# Patient Record
Sex: Male | Born: 1950
Health system: Southern US, Community
[De-identification: ages and names within clinical notes are randomized; demographics above are authoritative.]

## PROBLEM LIST (undated history)

## (undated) DIAGNOSIS — C801 Malignant (primary) neoplasm, unspecified: Secondary | ICD-10-CM

## (undated) DIAGNOSIS — M1990 Unspecified osteoarthritis, unspecified site: Secondary | ICD-10-CM

## (undated) DIAGNOSIS — N400 Enlarged prostate without lower urinary tract symptoms: Secondary | ICD-10-CM

## (undated) DIAGNOSIS — K219 Gastro-esophageal reflux disease without esophagitis: Secondary | ICD-10-CM

## (undated) DIAGNOSIS — Z86010 Personal history of colonic polyps: Secondary | ICD-10-CM

## (undated) DIAGNOSIS — I2510 Atherosclerotic heart disease of native coronary artery without angina pectoris: Secondary | ICD-10-CM

## (undated) DIAGNOSIS — I2721 Secondary pulmonary arterial hypertension: Secondary | ICD-10-CM

## (undated) DIAGNOSIS — I10 Essential (primary) hypertension: Secondary | ICD-10-CM

## (undated) DIAGNOSIS — K5730 Diverticulosis of large intestine without perforation or abscess without bleeding: Secondary | ICD-10-CM

## (undated) DIAGNOSIS — N529 Male erectile dysfunction, unspecified: Secondary | ICD-10-CM

## (undated) DIAGNOSIS — I739 Peripheral vascular disease, unspecified: Secondary | ICD-10-CM

## (undated) DIAGNOSIS — I700 Atherosclerosis of aorta: Secondary | ICD-10-CM

## (undated) DIAGNOSIS — D509 Iron deficiency anemia, unspecified: Secondary | ICD-10-CM

## (undated) DIAGNOSIS — J449 Chronic obstructive pulmonary disease, unspecified: Secondary | ICD-10-CM

## (undated) DIAGNOSIS — R972 Elevated prostate specific antigen [PSA]: Secondary | ICD-10-CM

## (undated) HISTORY — DX: Secondary pulmonary arterial hypertension: I27.21

## (undated) HISTORY — DX: Chronic obstructive pulmonary disease, unspecified: J44.9

## (undated) HISTORY — DX: Iron deficiency anemia, unspecified: D50.9

## (undated) HISTORY — DX: Atherosclerotic heart disease of native coronary artery without angina pectoris: I25.10

## (undated) HISTORY — DX: Atherosclerosis of aorta: I70.0

## (undated) HISTORY — DX: Unspecified osteoarthritis, unspecified site: M19.90

## (undated) HISTORY — PX: NO PAST SURGERIES: SHX2092

## (undated) HISTORY — DX: Elevated prostate specific antigen (PSA): R97.20

## (undated) HISTORY — DX: Essential (primary) hypertension: I10

## (undated) HISTORY — DX: Male erectile dysfunction, unspecified: N52.9

## (undated) HISTORY — PX: COLONOSCOPY: SHX174

## (undated) HISTORY — DX: Gastro-esophageal reflux disease without esophagitis: K21.9

## (undated) HISTORY — DX: Benign prostatic hyperplasia without lower urinary tract symptoms: N40.0

## (undated) HISTORY — DX: Personal history of colonic polyps: Z86.010

## (undated) HISTORY — DX: Diverticulosis of large intestine without perforation or abscess without bleeding: K57.30

---

## 2005-05-10 ENCOUNTER — Ambulatory Visit: Payer: Self-pay | Admitting: Internal Medicine

## 2006-07-17 ENCOUNTER — Ambulatory Visit: Payer: Self-pay | Admitting: Internal Medicine

## 2007-11-24 ENCOUNTER — Ambulatory Visit: Payer: Self-pay | Admitting: Internal Medicine

## 2007-11-24 DIAGNOSIS — Z860100 Personal history of colon polyps, unspecified: Secondary | ICD-10-CM

## 2007-11-24 DIAGNOSIS — I10 Essential (primary) hypertension: Secondary | ICD-10-CM | POA: Insufficient documentation

## 2007-11-24 DIAGNOSIS — K5730 Diverticulosis of large intestine without perforation or abscess without bleeding: Secondary | ICD-10-CM

## 2007-11-24 HISTORY — DX: Personal history of colon polyps, unspecified: Z86.0100

## 2007-11-24 HISTORY — DX: Personal history of colonic polyps: Z86.010

## 2007-11-24 HISTORY — DX: Diverticulosis of large intestine without perforation or abscess without bleeding: K57.30

## 2007-11-24 HISTORY — DX: Essential (primary) hypertension: I10

## 2007-11-24 LAB — CONVERTED CEMR LAB
Albumin: 3.8 g/dL (ref 3.5–5.2)
Alkaline Phosphatase: 55 units/L (ref 39–117)
BUN: 12 mg/dL (ref 6–23)
Bilirubin Urine: NEGATIVE
Bilirubin, Direct: 0.1 mg/dL (ref 0.0–0.3)
Calcium: 9.6 mg/dL (ref 8.4–10.5)
Cholesterol: 141 mg/dL (ref 0–200)
Eosinophils Absolute: 0.2 10*3/uL (ref 0.0–0.6)
GFR calc Af Amer: 100 mL/min
GFR calc non Af Amer: 83 mL/min
Glucose, Bld: 82 mg/dL (ref 70–99)
HCT: 44.2 % (ref 39.0–52.0)
HDL: 29 mg/dL — ABNORMAL LOW (ref >39.0)
Hemoglobin, Urine: NEGATIVE
Hemoglobin: 14.2 g/dL (ref 13.0–17.0)
Leukocytes, UA: NEGATIVE
MCV: 94.5 fL (ref 78.0–100.0)
Monocytes Absolute: 0.5 10*3/uL (ref 0.2–0.7)
Monocytes Relative: 6.8 % (ref 3.0–11.0)
Neutro Abs: 4 10*3/uL (ref 1.4–7.7)
Platelets: 306 10*3/uL (ref 150–400)
Potassium: 5.3 meq/L — ABNORMAL HIGH (ref 3.5–5.1)
RDW: 13.2 % (ref 11.5–14.6)
Sodium: 144 meq/L (ref 135–145)
TSH: 0.71 microintl units/mL (ref 0.35–5.50)
Total Protein: 7.3 g/dL (ref 6.0–8.3)
Triglycerides: 106 mg/dL (ref 0–149)
Urine Glucose: NEGATIVE mg/dL
VLDL: 21 mg/dL (ref 0–40)

## 2009-01-11 ENCOUNTER — Ambulatory Visit: Payer: Self-pay | Admitting: Internal Medicine

## 2009-01-12 LAB — CONVERTED CEMR LAB
ALT: 18 units/L (ref 0–53)
AST: 21 units/L (ref 0–37)
Albumin: 3.7 g/dL (ref 3.5–5.2)
Alkaline Phosphatase: 48 units/L (ref 39–117)
Basophils Absolute: 0 10*3/uL (ref 0.0–0.1)
Basophils Relative: 0.5 % (ref 0.0–3.0)
Calcium: 8.6 mg/dL (ref 8.4–10.5)
Cholesterol: 131 mg/dL (ref 0–200)
Eosinophils Relative: 3.3 % (ref 0.0–5.0)
GFR calc non Af Amer: 99.48 mL/min (ref >60)
Glucose, Bld: 97 mg/dL (ref 70–99)
HCT: 36.7 % — ABNORMAL LOW (ref 39.0–52.0)
Hemoglobin: 12.6 g/dL — ABNORMAL LOW (ref 13.0–17.0)
Ketones, ur: NEGATIVE mg/dL
Leukocytes, UA: NEGATIVE
Lymphocytes Relative: 37.7 % (ref 12.0–46.0)
Lymphs Abs: 2.8 10*3/uL (ref 0.7–4.0)
Monocytes Relative: 5.7 % (ref 3.0–12.0)
Neutro Abs: 3.9 10*3/uL (ref 1.4–7.7)
Nitrite: NEGATIVE
PSA: 1.12 ng/mL (ref 0.10–4.00)
Potassium: 3.6 meq/L (ref 3.5–5.1)
RBC: 3.93 M/uL — ABNORMAL LOW (ref 4.22–5.81)
Sodium: 142 meq/L (ref 135–145)
Specific Gravity, Urine: 1.025 (ref 1.000–1.030)
TSH: 0.59 microintl units/mL (ref 0.35–5.50)
Total Protein: 6.6 g/dL (ref 6.0–8.3)
Urobilinogen, UA: 0.2 (ref 0.0–1.0)
VLDL: 8.6 mg/dL (ref 0.0–40.0)
WBC: 7.3 10*3/uL (ref 4.5–10.5)

## 2010-02-13 ENCOUNTER — Encounter (INDEPENDENT_AMBULATORY_CARE_PROVIDER_SITE_OTHER): Payer: Self-pay | Admitting: *Deleted

## 2010-03-14 ENCOUNTER — Ambulatory Visit: Payer: Self-pay | Admitting: Internal Medicine

## 2010-03-14 LAB — CONVERTED CEMR LAB
ALT: 14 units/L (ref 0–53)
AST: 20 units/L (ref 0–37)
Alkaline Phosphatase: 53 units/L (ref 39–117)
BUN: 17 mg/dL (ref 6–23)
Bilirubin Urine: NEGATIVE
Bilirubin, Direct: 0.1 mg/dL (ref 0.0–0.3)
Creatinine, Ser: 1 mg/dL (ref 0.4–1.5)
Eosinophils Relative: 5.4 % — ABNORMAL HIGH (ref 0.0–5.0)
GFR calc non Af Amer: 94.68 mL/min (ref >60)
HCT: 37.3 % — ABNORMAL LOW (ref 39.0–52.0)
Hemoglobin, Urine: NEGATIVE
LDL Cholesterol: 68 mg/dL (ref 0–99)
Leukocytes, UA: NEGATIVE
Monocytes Relative: 7.4 % (ref 3.0–12.0)
Neutrophils Relative %: 49.2 % (ref 43.0–77.0)
Nitrite: NEGATIVE
PSA: 1.57 ng/mL (ref 0.10–4.00)
Platelets: 240 10*3/uL (ref 150.0–400.0)
Potassium: 4 meq/L (ref 3.5–5.1)
RBC: 4.02 M/uL — ABNORMAL LOW (ref 4.22–5.81)
TSH: 1.41 microintl units/mL (ref 0.35–5.50)
Total Bilirubin: 0.5 mg/dL (ref 0.3–1.2)
Total CHOL/HDL Ratio: 5
Total Protein, Urine: NEGATIVE mg/dL
WBC: 7.6 10*3/uL (ref 4.5–10.5)

## 2010-03-15 ENCOUNTER — Encounter (INDEPENDENT_AMBULATORY_CARE_PROVIDER_SITE_OTHER): Payer: Self-pay | Admitting: *Deleted

## 2010-04-30 ENCOUNTER — Encounter (INDEPENDENT_AMBULATORY_CARE_PROVIDER_SITE_OTHER): Payer: Self-pay | Admitting: *Deleted

## 2010-05-02 ENCOUNTER — Ambulatory Visit: Payer: Self-pay | Admitting: Internal Medicine

## 2010-05-16 ENCOUNTER — Ambulatory Visit: Payer: Self-pay | Admitting: Internal Medicine

## 2010-05-17 ENCOUNTER — Encounter: Payer: Self-pay | Admitting: Internal Medicine

## 2010-10-02 NOTE — Miscellaneous (Signed)
Summary: [facility]  Clinical Lists Changes  Medications: Added new medication of MIRALAX   POWD (POLYETHYLENE GLYCOL 3350) As per prep  instructions. - Signed Added new medication of DULCOLAX 5 MG  TBEC (BISACODYL) Day before procedure take 2 at 3pm and 2 at 8pm. - Signed Added new medication of REGLAN 10 MG  TABS (METOCLOPRAMIDE HCL) As per prep instructions. - Signed Rx of MIRALAX   POWD (POLYETHYLENE GLYCOL 3350) As per prep  instructions.;  #255gm x 0;  Signed;  Entered by: Ezra Sites RN;  Authorized by: Hart Carwin MD;  Method used: [facility]. [phone]*, [address], [place], [place]  [address], Ph: [phone] or [phone], Fax: [phone] Rx of DULCOLAX 5 MG  TBEC (BISACODYL) Day before procedure take 2 at 3pm and 2 at 8pm.;  #4 x 0;  Signed;  Entered by: Ezra Sites RN;  Authorized by: Hart Carwin MD;  Method used: [facility] to [facility]. [phone]*, [address], [place], [place]  [address], Ph: [phone] or [phone], Fax: [phone] Rx of REGLAN 10 MG  TABS (METOCLOPRAMIDE HCL) As per prep instructions.;  #2 x 0;  Signed;  Entered by: Ezra Sites RN;  Authorized by: Hart Carwin MD;  Method used: [facility] to [facility]. [phone]*, [address] [place], [place]  [address], Ph: [phone] or [phone], Fax: [phone] Observations: Added new observation of NKA: T (05/02/2010 10:26)    Prescriptions: REGLAN 10 MG  TABS (METOCLOPRAMIDE HCL) As per prep instructions.  #2 x 0   Entered by:   Ezra Sites RN   Authorized by:   Hart Carwin MD   Signed by:   Ezra Sites RN on 05/02/2010   Method used:   [facility] to        [facility]. [phone]* (retail)       [address], [place]  [address]       Ph: [phone] or [phone]       Fax: [phone]   RxID:   [id number] DULCOLAX 5 MG  TBEC (BISACODYL) Day before procedure take 2 at 3pm and 2 at 8pm.  #4 x 0   Entered by:   Ezra Sites RN   Authorized by:    Hart Carwin MD   Signed by:   Ezra Sites RN on 05/02/2010   Method used:   [facility]ronically to        [facility]. [phone]* (retail)       [address], [place]  [address]       Ph: [phone] or [phone]       Fax: [phone]   RxID:   [id number] MIRALAX   POWD (POLYETHYLENE GLYCOL 3350) As per prep  instructions.  #255gm x 0   Entered by:   Ezra Sites RN   Authorized by:   Hart Carwin MD   Signed by:   Ezra Sites RN on 05/02/2010   Method used:   [facility]ron[facility] to        [facility]. [phone]* (retail)       [address]       [place], [place]  [address]       Ph: [phone] or [phone]       Fax: [phone]   RxID:   [id number]

## 2010-10-02 NOTE — Letter (Signed)
Summary: Colonoscopy Letter  [facility]  [address] [place], [place] [address]   Phone: [phone]  Fax: [phone]      February 13, 2010 MRN: [id number]   [facility] RECORD [address] [place], [place]  [address]   Dear Mr. NEUMEISTER,   According to your medical record, it is time for you to schedule a Colonoscopy. The American Cancer Society recommends this procedure as a method to detect early colon cancer. Patients with a family history of colon cancer, or a personal history of colon polyps or inflammatory bowel disease are at increased risk.  This letter has beeen generated based on the recommendations made at the time of your procedure. If you feel that in your particular situation this may no longer apply, please contact our office.  Please call our office at [phone] to schedule this appointment or to update your records at your earliest convenience.  Thank you for cooperating with [place] to provide you with the very best care possible.   Sincerely,  Hedwig Morton. Juanda Chance, M.D.  [facility] Gastroenterology Division [phone]

## 2010-10-02 NOTE — Letter (Signed)
Summary: Patient Notice- Polyp Results  [facility]  [address] [place], [place] [address]   Phone: [phone]  Fax: [phone]        May 17, 2010 MRN: [id number]    Joshua Curtis [address] [place], [place]  [address]    Dear [name],  I am pleased to inform you that the colon polyp(s) removed during your recent colonoscopy was (were) found to be benign (no cancer detected) upon pathologic examination.The polyps were adenomatous ( precancerous) and hyperplastic ( not precancerous)  I recommend you have a repeat colonoscopy examination in 5_ years to look for recurrent polyps, as having colon polyps increases your risk for having recurrent polyps or even colon cancer in the future.  Should you develop new or worsening symptoms of abdominal pain, bowel habit changes or bleeding from the rectum or bowels, please schedule an evaluation with either your primary care physician or with me.  Additional information/recommendations:  _x_ No further action with gastroenterology is needed at this time. Please      follow-up with your primary care physician for your other healthcare      needs.  __ Please call [phone] to schedule a return visit to review your      situation.  __ Please keep your follow-up visit as already scheduled.  __ Continue treatment plan as outlined the day of your exam.  Please call us if you are having persistent problems or have questions about your condition that have not been fully answered at this time.  Sincerely,  Hart Carwin MD  This letter has been electronically signed by your physician.  Appended Document: Patient Notice- Polyp Results letter mailed

## 2010-10-02 NOTE — Letter (Signed)
Summary: [facility] Instructions  [facility]  [address] [place], [place] [address]   Phone: [phone]  Fax: [phone]       Joshua Curtis    1950-10-16    MRN: [id number]       Procedure Day Dorna Bloom: Lucia Bitter. 05/16/2010     Arrival Time: 08:00AM     Procedure Time: 09:00AM     Location of Procedure:                    _ X_  [facility] (4th Floor)   PREPARATION FOR COLONOSCOPY WITH MIRALAX  Starting 5 days prior to your procedure 09/09 do not eat nuts, seeds, popcorn, corn, beans, peas,  salads, or any raw vegetables.  Do not take any fiber supplements (e.g. Metamucil, Citrucel, and Benefiber). ____________________________________________________________________________________________________   THE DAY BEFORE YOUR PROCEDURE         DATE: 09/13 DAY: Rica Mote.  1   Drink clear liquids the entire day-NO SOLID FOOD  2   Do not drink anything colored red or purple.  Avoid juices with pulp.  No orange juice.  3   Drink at least 64 oz. (8 glasses) of fluid/clear liquids during the day to prevent dehydration and help the prep work efficiently.  CLEAR LIQUIDS INCLUDE: Water Jello Ice Popsicles Tea (sugar ok, no milk/cream) Powdered fruit flavored drinks Coffee (sugar ok, no milk/cream) Gatorade Juice: apple, white grape, white cranberry  Lemonade Clear bullion, consomm, broth Carbonated beverages (any kind) Strained chicken noodle soup Hard Candy  4   Mix the entire bottle of Miralax with 64 oz. of Gatorade/Powerade in the morning and put in the refrigerator to chill.  5   At 3:00 pm take 2 Dulcolax/Bisacodyl tablets.  6   At 4:30 pm take one Reglan/Metoclopramide tablet.  7  Starting at 5:00 pm drink one 8 oz glass of the Miralax mixture every 15-20 minutes until you have finished drinking the entire 64 oz.  You should finish drinking prep around 7:30 or 8:00 pm.  8   If you are nauseated, you may take the 2nd Reglan/Metoclopramide tablet at  6:30 pm.        9    At 8:00 pm take 2 more DULCOLAX/Bisacodyl tablets.     THE DAY OF YOUR PROCEDURE      DATE:  09/14   DAY: Wed.  You may drink clear liquids until 07:00AM  (2 HOURS BEFORE PROCEDURE).   MEDICATION INSTRUCTIONS  Unless otherwise instructed, you should take regular prescription medications with a small sip of water as early as possible the morning of your procedure.           OTHER INSTRUCTIONS  You will need a responsible adult at least 58 years of age to accompany you and drive you home.   This person must remain in the waiting room during your procedure.  Wear loose fitting clothing that is easily removed.  Leave jewelry and other valuables at home.  However, you may wish to bring a book to read or an iPod/MP3 player to listen to music as you wait for your procedure to start.  Remove all body piercing jewelry and leave at home.  Total time from sign-in until discharge is approximately 2-3 hours.  You should go home directly after your procedure and rest.  You can resume normal activities the day after your procedure.  The day of your procedure you should not:   Drive   Make  legal decisions   Operate machinery   Drink alcohol   Return to work  You will receive specific instructions about eating, activities and medications before you leave.   The above instructions have been reviewed and explained to me by   Ezra Sites RN  May 02, 2010 10:54 AM     I fully understand and can verbalize these instructions _____________________________ Date _______

## 2010-10-02 NOTE — Letter (Signed)
Summary: Previsit letter  [facility]  [address] [place], [place] [address]   Phone: [phone]  Fax: [phone]       03/15/2010 MRN: [id number]  Joshua Curtis [address] [place], [place]  [address]  Dear Mr. S[place]MERVILLE,  Welcome to the Gastroenterology Division at [facility].    You are scheduled to see a nurse for your pre-procedure visit on May 02, 2010 at 10:30am on the 3rd floor at [facility], [address]  We ask that you try to arrive at our office 15 minutes prior to your appointment time to allow for check-in.  Your nurse visit will consist of discussing your medical and surgical history, your immediate family medical history, and your medications.    Please bring a complete list of all your medications or, if you prefer, bring the medication bottles and we will list them.  We will need to be aware of both prescribed and over the counter drugs.  We will need to know exact dosage information as well.  If you are on blood thinners (Coumadin, Plavix, Aggrenox, Ticlid, etc.) please call our office today/prior to your appointment, as we need to consult with your physician about holding your medication.   Please be prepared to read and sign documents such as consent forms, a financial agreement, and acknowledgement forms.  If necessary, and with your consent, a friend or relative is welcome to sit-in on the nurse visit with you.  Please bring your insurance card so that we may make a copy of it.  If your insurance requires a referral to see a specialist, please bring your referral form from your primary care physician.  No co-pay is required for this nurse visit.     If you cannot keep your appointment, please call [phone] to cancel or reschedule prior to your appointment date.  This allows [place] the opportunity to schedule an appointment for another patient in need of care.    Thank you for choosing [facility] for your  medical needs.  We appreciate the opportunity to care for you.  Please visit [place] at our website  to learn more about our practice.                     Sincerely.                                                                                                                   The Gastroenterology Division

## 2010-10-02 NOTE — Procedures (Signed)
Summary: Colonoscopy  Patient: Joshua Curtis Note: All result statuses are Final unless otherwise noted.  Tests: (1) Colonoscopy (COL)   COL Colonoscopy           DONE     [facility]     [address]     [place], [place]  [address]           COLONOSCOPY PROCEDURE REPORT           PATIENT:  Joshua Curtis, Joshua Curtis  MR#:  [id number]     BIRTHDATE:  November 04, 1950, 59 yrs. old  GENDER:  male     ENDOSCOPIST:  Hedwig Morton. Juanda Chance, MD     REF. BY:     PROCEDURE DATE:  05/16/2010     PROCEDURE:  Colonoscopy 213[address]     ASA CLASS:  Class I     INDICATIONS:  Elevated Risk Screening hyperplastic polyp in 2004     MEDICATIONS:   Versed 9 mg, Fentanyl 75 mcg           DESCRIPTION OF PROCEDURE:   After the risks benefits and     alternatives of the procedure were thoroughly explained, informed     consent was obtained.  Digital rectal exam was performed and     revealed no rectal masses.   The LB [id number] endoscope     was introduced through the anus and advanced to the cecum, which     was identified by both the appendix and ileocecal valve, without     limitations.  The quality of the prep was good, using MiraLax.     The instrument was then slowly withdrawn as the colon was fully     examined.     <<PROCEDUREIMAGES>>           FINDINGS:  Two polyps were found. 8 mm and 3 mm sessile polyp at     15 and 20 cm The polyp was removed using cold biopsy forceps.     Polyp was snared without cautery. Retrieval was successful (see     image6 and image1). snare polyp  Mild diverticulosis was found in     the sigmoid colon (see image2).  This was otherwise a normal     examination of the colon (see image3, image4, image5, and image7).     Retroflexed views in the rectum revealed no abnormalities.    The     scope was then withdrawn from the patient and the procedure     completed.           COMPLICATIONS:  None     ENDOSCOPIC IMPRESSION:     1) Two polyps     2) Mild diverticulosis in  the sigmoid colon     3) Otherwise normal examination     RECOMMENDATIONS:     1) Await pathology results     2) High fiber diet.     REPEAT EXAM:  In 5 - 7 year(s) for.           ______________________________     Hedwig Morton. Juanda Chance, MD           CC:           n.     eSIGNED:   Hedwig Morton. Brodie at 05/16/2010 09:54 AM           Ellyn Hack, [id number]  Note: An exclamation mark (!) indicates a result that was not dispersed into the flowsheet. Document Creation Date: 05/16/2010  9:55 AM _______________________________________________________________________  (1) Order result status: Final Collection or observation date-time: 05/16/2010 09:46 Requested date-time:  Receipt date-time:  Reported date-time:  Referring Physician:   Ordering Physician: Lina Sar [phone]) Specimen Source:  Source: Launa Grill Order Number: [id number] Lab site:   Appended Document: Colonoscopy     Procedures Next Due Date:    Colonoscopy: 05/2015

## 2010-10-02 NOTE — Assessment & Plan Note (Signed)
Summary: F/U APPT/#/CD   Vital Signs:  Patient profile:   60 year old male Height:      70 inches Weight:      179.75 pounds BMI:     25.88 O2 Sat:      98 % on Room air Temp:     97.6 degrees F oral Pulse rate:   79 / minute BP sitting:   142 / 78  (left arm) Cuff size:   regular  Vitals Entered By: Zella Ball Ewing CMA Duncan Dull) (March 14, 2010 9:04 AM)  O2 Flow:  Room air   CC: followup, refills/RE   CC:  followup and refills/RE.  History of Present Illness: overall doing wel, BP at home  140/90;  Pt denies CP, sob, doe, wheezing, orthopnea, pnd, worsening LE edema, palps, dizziness or syncope  Pt denies new neuro symptoms such as headache, facial or extremity weakness   Preventive Screening-Counseling & Management      Drug Use:  no.    Problems Prior to Update: 1)  Preventive Health Care  (ICD-V70.0) 2)  Preventive Health Care  (ICD-V70.0) 3)  Diverticulosis, Colon  (ICD-562.10) 4)  Colonic Polyps, Hx of  (ICD-V12.72) 5)  Hypertension  (ICD-401.9)  Medications Prior to Update: 1)  Amlodipine Besy-Benazepril Hcl 10-20 Mg  Caps (Amlodipine Besy-Benazepril Hcl) .Marland Kitchen.. 1 By Mouth Once Daily 2)  Adult Aspirin Ec Low Strength 81 Mg Tbec (Aspirin) .Marland Kitchen.. 1po Once Daily  Current Medications (verified): 1)  Amlodipine Besy-Benazepril Hcl 10-20 Mg  Caps (Amlodipine Besy-Benazepril Hcl) .Marland Kitchen.. 1 By Mouth Once Daily 2)  Adult Aspirin Ec Low Strength 81 Mg Tbec (Aspirin) .Marland Kitchen.. 1po Once Daily  Allergies (verified): No Known Drug Allergies  Past History:  Past Medical History: Last updated: 11/24/2007 Hypertension E.D. 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         all otherwise negative per pt -    Physical Exam  General:  alert and well-developed.   Head:  normocephalic and atraumatic.   Eyes:  vision grossly intact, pupils equal, and pupils round.   Ears:  R ear normal and L ear normal.   Nose:  no external deformity and no nasal discharge.   Mouth:  no gingival abnormalities and pharynx pink and moist.   Neck:  supple and no masses.   Lungs:  normal respiratory effort and normal breath sounds.   Heart:  normal rate and regular rhythm.   Abdomen:  soft, non-tender, and normal bowel sounds.   Msk:  no joint tenderness and no joint swelling.   Extremities:  no edema, no erythema  Neurologic:  cranial nerves II-XII intact and strength normal in all extremities.  Skin:  color normal and no rashes.   Psych:  not anxious appearing and not depressed appearing.     Impression & Recommendations:  Problem # 1:  Preventive Health Care (ICD-V70.0)  Overall doing well, age appropriate education and counseling updated and referral for appropriate preventive services done unless declined, immunizations up to date or declined, diet counseling done if overweight, urged to quit smoking if smokes , most recent labs reviewed and current ordered  if appropriate, ecg reviewed or declined (interpretation per ECG scanned in the EMR if done); information regarding Medicare Prevention requirements given if appropriate; speciality referrals updated as appropriate   Orders: TLB-BMP (Basic Metabolic Panel-BMET) (80048-METABOL) TLB-CBC Platelet - w/Differential (85025-CBCD) TLB-Hepatic/Liver Function Pnl (80076-HEPATIC) TLB-Lipid Panel (80061-LIPID) TLB-PSA (Prostate Specific Antigen) (84153-PSA) TLB-TSH (Thyroid Stimulating Hormone) (84443-TSH) TLB-Udip ONLY (81003-UDIP)  Problem # 2:  COLONIC POLYPS, HX OF (ICD-V12.72) due for colonoscpoy Orders: Gastroenterology Referral (GI)  Problem # 3:  HYPERTENSION (ICD-401.9)  His updated medication list for this problem includes:    Amlodipine Besy-benazepril Hcl 10-20 Mg Caps (Amlodipine besy-benazepril hcl) .Marland Kitchen... 1 by mouth once daily  BP today: 142/78 Prior BP: 136/76 (01/11/2009)  Labs Reviewed: K+: 3.6 (01/11/2009) Creat: : 1.0 (01/11/2009)   Chol: 131 (01/11/2009)   HDL: 33.30 (01/11/2009)   LDL: 89 (01/11/2009)   TG: 43.0 (01/11/2009) mild elev today, pt declines increased meds today, to follow closely at home per pt adn next visit  Complete Medication List: 1)  Amlodipine Besy-benazepril Hcl 10-20 Mg Caps (Amlodipine besy-benazepril hcl) .Marland Kitchen.. 1 by mouth once daily 2)  Adult Aspirin Ec Low Strength 81 Mg Tbec (Aspirin) .Marland Kitchen.. 1po once daily  Other Orders: Tdap => 4yrs IM ([address]) Admin 1st Vaccine (86578)  Patient Instructions: 1)  you had the tetanus shot today 2)  Please go to the Lab in the basement for your blood and/or urine tests today  3)  You will be contacted about the referral(s) to: colonscopy 4)  Please schedule a follow-up appointment in 1 year or sooner if needed 5)  Check your Blood Pressure regularly. If it is above 140/90: you should make an appointment. Prescriptions: AMLODIPINE BESY-BENAZEPRIL HCL 10-20 MG  CAPS (AMLODIPINE BESY-BENAZEPRIL HCL) 1 by mouth  once daily  #90 x 3   Entered and Authorized by:   Corwin Levins MD   Signed by:   Corwin Levins MD on 03/14/2010   Method used:   [facility]        [facility]. [phone]* (retail)       [address], [place]  [address]       Ph: [phone] or [phone]       Fax: [phone]   RxID:   [id number]    Immunizations Administered:  Tetanus Vaccine:    Vaccine Type: Tdap    Site: left deltoid    Mfr: GlaxoSmithKline    Dose: 0.5 ml    Route: IM    Given by: Zella Ball Ewing CMA (AAMA)    Exp. Date: 11/24/2011    Lot #: [id number]    VIS given: 07/21/07 version given March 14, 2010.

## 2011-03-20 ENCOUNTER — Other Ambulatory Visit: Payer: Self-pay | Admitting: Internal Medicine

## 2011-04-25 ENCOUNTER — Other Ambulatory Visit: Payer: Self-pay | Admitting: Internal Medicine

## 2011-05-24 ENCOUNTER — Other Ambulatory Visit: Payer: Self-pay | Admitting: Internal Medicine

## 2011-06-13 ENCOUNTER — Encounter: Payer: Self-pay | Admitting: Internal Medicine

## 2011-06-16 ENCOUNTER — Encounter: Payer: Self-pay | Admitting: Internal Medicine

## 2011-06-16 DIAGNOSIS — Z0001 Encounter for general adult medical examination with abnormal findings: Secondary | ICD-10-CM | POA: Insufficient documentation

## 2011-06-21 ENCOUNTER — Encounter: Payer: Self-pay | Admitting: Internal Medicine

## 2011-06-21 ENCOUNTER — Other Ambulatory Visit (INDEPENDENT_AMBULATORY_CARE_PROVIDER_SITE_OTHER): Payer: BC Managed Care – PPO

## 2011-06-21 ENCOUNTER — Ambulatory Visit (INDEPENDENT_AMBULATORY_CARE_PROVIDER_SITE_OTHER): Payer: BC Managed Care – PPO | Admitting: Internal Medicine

## 2011-06-21 VITALS — BP 122/72 | HR 84 | Temp 98.0°F | Ht 72.0 in | Wt 175.2 lb

## 2011-06-21 DIAGNOSIS — Z0000 Encounter for general adult medical examination without abnormal findings: Secondary | ICD-10-CM

## 2011-06-21 DIAGNOSIS — Z23 Encounter for immunization: Secondary | ICD-10-CM

## 2011-06-21 LAB — LIPID PANEL
Cholesterol: 143 mg/dL (ref 0–200)
HDL: 37.7 mg/dL — ABNORMAL LOW (ref >39.00)
LDL Cholesterol: 93 mg/dL (ref 0–99)
Total CHOL/HDL Ratio: 4
Triglycerides: 61 mg/dL (ref 0.0–149.0)

## 2011-06-21 LAB — CBC WITH DIFFERENTIAL/PLATELET
Basophils Relative: 0.6 % (ref 0.0–3.0)
Eosinophils Absolute: 0.5 10*3/uL (ref 0.0–0.7)
Eosinophils Relative: 6.6 % — ABNORMAL HIGH (ref 0.0–5.0)
Hemoglobin: 13.8 g/dL (ref 13.0–17.0)
Lymphocytes Relative: 36.7 % (ref 12.0–46.0)
Monocytes Relative: 9.3 % (ref 3.0–12.0)
Neutro Abs: 3.2 10*3/uL (ref 1.4–7.7)
Neutrophils Relative %: 46.8 % (ref 43.0–77.0)
RBC: 4.3 Mil/uL (ref 4.22–5.81)
WBC: 6.8 10*3/uL (ref 4.5–10.5)

## 2011-06-21 LAB — URINALYSIS, ROUTINE W REFLEX MICROSCOPIC
Hgb urine dipstick: NEGATIVE
Nitrite: NEGATIVE
Specific Gravity, Urine: 1.025 (ref 1.000–1.030)
Total Protein, Urine: NEGATIVE
Urine Glucose: NEGATIVE
pH: 6 (ref 5.0–8.0)

## 2011-06-21 LAB — BASIC METABOLIC PANEL
BUN: 19 mg/dL (ref 6–23)
CO2: 26 mEq/L (ref 19–32)
Calcium: 8.9 mg/dL (ref 8.4–10.5)
Chloride: 108 mEq/L (ref 96–112)
Creatinine, Ser: 1.1 mg/dL (ref 0.4–1.5)

## 2011-06-21 LAB — HEPATIC FUNCTION PANEL
ALT: 13 U/L (ref 0–53)
Albumin: 3.9 g/dL (ref 3.5–5.2)
Bilirubin, Direct: 0 mg/dL (ref 0.0–0.3)
Total Protein: 6.9 g/dL (ref 6.0–8.3)

## 2011-06-21 LAB — PSA: PSA: 2.84 ng/mL (ref 0.10–4.00)

## 2011-06-21 MED ORDER — AMLODIPINE BESY-BENAZEPRIL HCL 10-20 MG PO CAPS: 1.0000 | ORAL_CAPSULE | Freq: Every day | ORAL | Status: DC

## 2011-06-21 NOTE — Assessment & Plan Note (Signed)
Overall doing well, age appropriate education and counseling updated, referrals for preventative services and immunizations addressed, dietary and smoking counseling addressed, most recent labs and ECG reviewed.  I have personally reviewed and have noted: 1) the patient's medical and social history 2) The pt's use of alcohol, tobacco, and illicit drugs 3) The patient's current medications and supplements 4) Functional ability including ADL's, fall risk, home safety risk, hearing and visual impairment 5) Diet and physical activities 6) Evidence for depression or mood disorder 7) The patient's height, weight, and BMI have been recorded in the chart I have made referrals, and provided counseling and education based on review of the above ECG reviewed as per emr, for labs today

## 2011-06-21 NOTE — Progress Notes (Signed)
Subjective:    Patient ID: Joshua Curtis, male    DOB: [birth year], 60 y.o.   MRN: [id number]  HPI  Here for wellness and f/u;  Overall doing ok;  Pt denies CP, worsening SOB, DOE, wheezing, orthopnea, PND, worsening LE edema, palpitations, dizziness or syncope.  Pt denies neurological change such as new Headache, facial or extremity weakness.  Pt denies polydipsia, polyuria, or low sugar symptoms. Pt states overall good compliance with treatment and medications, good tolerability, and trying to follow lower cholesterol diet.  Pt denies worsening depressive symptoms, suicidal ideation or panic. No fever, wt loss, night sweats, loss of appetite, or other constitutional symptoms.  Pt states good ability with ADL's, low fall risk, home safety reviewed and adequate, no significant changes in hearing or vision, and occasionally active with exercise. Past Medical History  Diagnosis Date  . COLONIC POLYPS, HX OF 11/24/2007  . DIVERTICULOSIS, COLON 11/24/2007  . HYPERTENSION 11/24/2007  . ED (erectile dysfunction)   . Increased prostate specific antigen (PSA) velocity 06/22/2011   No past surgical history on file.  reports that he has been smoking.  He does not have any smokeless tobacco history on file. He reports that he drinks alcohol. He reports that he does not use illicit drugs. family history includes Diabetes in his other; Hypertension in his other; and Stroke in his other. No Known Allergies Current Outpatient Prescriptions on File Prior to Visit  Medication Sig Dispense Refill  . aspirin 81 MG tablet Take 81 mg by mouth daily.         Review of Systems Review of Systems  Constitutional: Negative for diaphoresis, activity change, appetite change and unexpected weight change.  HENT: Negative for hearing loss, ear pain, facial swelling, mouth sores and neck stiffness.   Eyes: Negative for pain, redness and visual disturbance.  Respiratory: Negative for shortness of breath and wheezing.     Cardiovascular: Negative for chest pain and palpitations.  Gastrointestinal: Negative for diarrhea, blood in stool, abdominal distention and rectal pain.  Genitourinary: Negative for hematuria, flank pain and decreased urine volume.  Musculoskeletal: Negative for myalgias and joint swelling.  Skin: Negative for color change and wound.  Neurological: Negative for syncope and numbness.  Hematological: Negative for adenopathy.  Psychiatric/Behavioral: Negative for hallucinations, self-injury, decreased concentration and agitation.      Objective:   Physical Exam BP 122/72  Pulse 84  Temp(Src) 98 F (36.7 C) (Oral)  Ht 6' (1.829 m)  Wt 175 lb 3.2 oz (79.47 kg)  BMI 23.76 kg/m2  SpO2 97% Physical Exam  VS noted Constitutional: Pt is oriented to person, place, and time. Appears well-developed and well-nourished.  HENT:  Head: Normocephalic and atraumatic.  Right Ear: External ear normal.  Left Ear: External ear normal.  Nose: Nose normal.  Mouth/Throat: Oropharynx is clear and moist.  Eyes: Conjunctivae and EOM are normal. Pupils are equal, round, and reactive to light.  Neck: Normal range of motion. Neck supple. No JVD present. No tracheal deviation present.  Cardiovascular: Normal rate, regular rhythm, normal heart sounds and intact distal pulses.   Pulmonary/Chest: Effort normal and breath sounds normal.  Abdominal: Soft. Bowel sounds are normal. There is no tenderness.  Musculoskeletal: Normal range of motion. Exhibits no edema.  Lymphadenopathy:  Has no cervical adenopathy.  Neurological: Pt is alert and oriented to person, place, and time. Pt has normal reflexes. No cranial nerve deficit.  Skin: Skin is warm and dry. No rash noted.  Psychiatric:  Has  normal mood and affect. Behavior is normal.     Assessment & Plan:

## 2011-06-21 NOTE — Patient Instructions (Addendum)
You had the flu shot today Your EKG was good Your shots were otherwise up to date Your next colonoscopy would be in 2016 Your medication was refilled to the pharmacy Please go to LAB in the Basement for the blood and/or urine tests to be done today Please call the phone number [phone] (the [employer] System) for results of testing in 2-3 days;  When calling, simply dial the number, and when prompted enter the MRN number above (the Medical Record Number) and the # key, then the message should start. Please return in 1 year for your yearly visit, or sooner if needed, with Lab testing done 3-5 days before

## 2011-06-22 ENCOUNTER — Encounter: Payer: Self-pay | Admitting: Internal Medicine

## 2011-06-22 ENCOUNTER — Other Ambulatory Visit: Payer: Self-pay | Admitting: Internal Medicine

## 2011-06-22 DIAGNOSIS — R972 Elevated prostate specific antigen [PSA]: Secondary | ICD-10-CM | POA: Insufficient documentation

## 2011-06-22 HISTORY — DX: Elevated prostate specific antigen (PSA): R97.20

## 2011-07-08 ENCOUNTER — Telehealth: Payer: Self-pay

## 2011-07-08 NOTE — Telephone Encounter (Signed)
The patient called to inform he did get appointment with urologist and wanted [place] to know he was informed and appreciated the appointment being made for him.

## 2011-07-08 NOTE — Telephone Encounter (Signed)
Patient called left message to have someone call, did call the patient left message to call back

## 2012-07-09 ENCOUNTER — Other Ambulatory Visit: Payer: Self-pay | Admitting: Internal Medicine

## 2012-09-25 ENCOUNTER — Encounter: Payer: Self-pay | Admitting: Internal Medicine

## 2012-09-25 ENCOUNTER — Ambulatory Visit (INDEPENDENT_AMBULATORY_CARE_PROVIDER_SITE_OTHER): Payer: BC Managed Care – PPO | Admitting: Internal Medicine

## 2012-09-25 ENCOUNTER — Other Ambulatory Visit (INDEPENDENT_AMBULATORY_CARE_PROVIDER_SITE_OTHER): Payer: BC Managed Care – PPO

## 2012-09-25 VITALS — BP 140/72 | HR 82 | Temp 98.4°F | Ht 70.0 in | Wt 181.4 lb

## 2012-09-25 DIAGNOSIS — Z23 Encounter for immunization: Secondary | ICD-10-CM

## 2012-09-25 DIAGNOSIS — Z0000 Encounter for general adult medical examination without abnormal findings: Secondary | ICD-10-CM

## 2012-09-25 DIAGNOSIS — I10 Essential (primary) hypertension: Secondary | ICD-10-CM

## 2012-09-25 LAB — BASIC METABOLIC PANEL
CO2: 26 mEq/L (ref 19–32)
Glucose, Bld: 97 mg/dL (ref 70–99)
Potassium: 4.8 mEq/L (ref 3.5–5.1)
Sodium: 138 mEq/L (ref 135–145)

## 2012-09-25 LAB — HEPATIC FUNCTION PANEL
ALT: 13 U/L (ref 0–53)
Albumin: 3.8 g/dL (ref 3.5–5.2)
Alkaline Phosphatase: 57 U/L (ref 39–117)
Bilirubin, Direct: 0.1 mg/dL (ref 0.0–0.3)
Total Protein: 7 g/dL (ref 6.0–8.3)

## 2012-09-25 LAB — CBC WITH DIFFERENTIAL/PLATELET
Basophils Absolute: 0.1 10*3/uL (ref 0.0–0.1)
Eosinophils Absolute: 0.4 10*3/uL (ref 0.0–0.7)
Lymphocytes Relative: 26.7 % (ref 12.0–46.0)
MCHC: 33.6 g/dL (ref 30.0–36.0)
Monocytes Relative: 7.8 % (ref 3.0–12.0)
Neutrophils Relative %: 60.1 % (ref 43.0–77.0)
RBC: 4.17 Mil/uL — ABNORMAL LOW (ref 4.22–5.81)
RDW: 15.2 % — ABNORMAL HIGH (ref 11.5–14.6)

## 2012-09-25 LAB — URINALYSIS, ROUTINE W REFLEX MICROSCOPIC
Hgb urine dipstick: NEGATIVE
Leukocytes, UA: NEGATIVE
Nitrite: NEGATIVE
Specific Gravity, Urine: 1.02 (ref 1.000–1.030)
Urobilinogen, UA: 0.2 (ref 0.0–1.0)

## 2012-09-25 LAB — LIPID PANEL: Cholesterol: 130 mg/dL (ref 0–200)

## 2012-09-25 MED ORDER — AMLODIPINE BESY-BENAZEPRIL HCL 10-20 MG PO CAPS: ORAL_CAPSULE | ORAL | Status: DC

## 2012-09-25 NOTE — Progress Notes (Signed)
Subjective:    Patient ID: Joshua Curtis, male    DOB: 26-Jun-1951, 62 y.o.   MRN: [id number]  HPI  Here for wellness and f/u;  Overall doing ok;  Pt denies CP, worsening SOB, DOE, wheezing, orthopnea, PND, worsening LE edema, palpitations, dizziness or syncope.  Pt denies neurological change such as new headache, facial or extremity weakness.  Pt denies polydipsia, polyuria, or low sugar symptoms. Pt states overall good compliance with treatment and medications, good tolerability, and has been trying to follow lower cholesterol diet.  Pt denies worsening depressive symptoms, suicidal ideation or panic. No fever, night sweats, wt loss, loss of appetite, or other constitutional symptoms.  Pt states good ability with ADL's, has low fall risk, home safety reviewed and adequate, no other significant changes in hearing or vision, and only occasionally active with exercise.  BP at home and drug stores dont run > 140/90. Had prostate biopsy neg 2013 with Dr Ottelin/urology, no further specific f/u felt needed  Has been somewhat less active outside of work, gained 6 lbs. Still smoking but only now abou 4 cigs per day.  Needs refills Past Medical History  Diagnosis Date  . COLONIC POLYPS, HX OF 11/24/2007  . DIVERTICULOSIS, COLON 11/24/2007  . HYPERTENSION 11/24/2007  . ED (erectile dysfunction)   . Increased prostate specific antigen (PSA) velocity 06/22/2011   No past surgical history on file.  reports that he has been smoking.  He does not have any smokeless tobacco history on file. He reports that he drinks alcohol. He reports that he does not use illicit drugs. family history includes Diabetes in his other; Hypertension in his other; and Stroke in his other. No Known Allergies Current Outpatient Prescriptions on File Prior to Visit  Medication Sig Dispense Refill  . amLODipine-benazepril (LOTREL) 10-20 MG per capsule TAKE ONE CAPSULE BY MOUTH ONE TIME DAILY  30 capsule  2  . aspirin 81 MG tablet Take  81 mg by mouth daily.         Review of Systems Constitutional: Negative for diaphoresis, activity change, appetite change or unexpected weight change.  HENT: Negative for hearing loss, ear pain, facial swelling, mouth sores and neck stiffness.   Eyes: Negative for pain, redness and visual disturbance.  Respiratory: Negative for shortness of breath and wheezing.   Cardiovascular: Negative for chest pain and palpitations.  Gastrointestinal: Negative for diarrhea, blood in stool, abdominal distention or other pain Genitourinary: Negative for hematuria, flank pain or change in urine volume.  Musculoskeletal: Negative for myalgias and joint swelling.  Skin: Negative for color change and wound.  Neurological: Negative for syncope and numbness. other than noted Hematological: Negative for adenopathy.  Psychiatric/Behavioral: Negative for hallucinations, self-injury, decreased concentration and agitation.      Objective:   Physical Exam BP 140/72  Pulse 82  Temp 98.4 F (36.9 C) (Oral)  Ht 5\' 10"  (1.778 m)  Wt 181 lb 6 oz (82.271 kg)  BMI 26.02 kg/m2  SpO2 97% VS noted,  Constitutional: Pt is oriented to person, place, and time. Appears well-developed and well-nourished.  Head: Normocephalic and atraumatic.  Right Ear: External ear normal.  Left Ear: External ear normal.  Nose: Nose normal.  Mouth/Throat: Oropharynx is clear and moist.  Eyes: Conjunctivae and EOM are normal. Pupils are equal, round, and reactive to light.  Neck: Normal range of motion. Neck supple. No JVD present. No tracheal deviation present.  Cardiovascular: Normal rate, regular rhythm, normal heart sounds and intact distal pulses.  Pulmonary/Chest: Effort normal and breath sounds normal.  Abdominal: Soft. Bowel sounds are normal. There is no tenderness. No HSM  Musculoskeletal: Normal range of motion. Exhibits no edema.  Lymphadenopathy:  Has no cervical adenopathy.  Neurological: Pt is alert and oriented to  person, place, and time. Pt has normal reflexes. No cranial nerve deficit.  Skin: Skin is warm and dry. No rash noted.  Psychiatric:  Has  normal mood and affect. Behavior is normal.     Assessment & Plan:

## 2012-09-25 NOTE — Assessment & Plan Note (Signed)

## 2012-09-25 NOTE — Patient Instructions (Addendum)
You had the flu shot today Your EKG was OK Please stop smoking Please continue your efforts at being more active, low cholesterol diet, and weight control. Please continue all other medications as before, and refills have been done if requested. Please have the pharmacy call with any other refills you may need. Please go to the LAB in the Basement (turn left off the elevator) for the tests to be done today You will be contacted by phone if any changes need to be made immediately.  Otherwise, you will receive a letter about your results with an explanation, Please remember to sign up for My Chart if you have not done so, as this will be important to you in the future with finding out test results, communicating by private email, and scheduling acute appointments online when needed. Please return in 1 year for your yearly visit, or sooner if needed, with Lab testing done 3-5 days before

## 2012-09-25 NOTE — Assessment & Plan Note (Signed)
stable overall by history and exam, recent data reviewed with pt, and pt to continue medical treatment as before,  to f/u any worsening symptoms or concerns BP Readings from Last 3 Encounters:  09/25/12 140/72  06/21/11 122/72  03/14/10 142/78

## 2013-04-28 ENCOUNTER — Telehealth: Payer: Self-pay

## 2013-04-28 NOTE — Telephone Encounter (Signed)
Patient called [employer] requesting status of paperwork that was dropped off for completion. He would like to come by and pick it up. Thanks

## 2013-04-28 NOTE — Telephone Encounter (Signed)
Paperwork was completed and given to [facility] for signature, will notify patient when ready for pick up

## 2013-04-29 NOTE — Telephone Encounter (Signed)
Paperwork completed, patient came by to pick up

## 2013-08-18 ENCOUNTER — Other Ambulatory Visit: Payer: Self-pay | Admitting: Internal Medicine

## 2013-09-17 ENCOUNTER — Other Ambulatory Visit: Payer: Self-pay | Admitting: Internal Medicine

## 2013-09-27 ENCOUNTER — Encounter: Payer: BC Managed Care – PPO | Admitting: Internal Medicine

## 2013-09-28 ENCOUNTER — Encounter: Payer: Self-pay | Admitting: Internal Medicine

## 2013-09-28 ENCOUNTER — Other Ambulatory Visit: Payer: Self-pay | Admitting: Internal Medicine

## 2013-09-28 ENCOUNTER — Ambulatory Visit (INDEPENDENT_AMBULATORY_CARE_PROVIDER_SITE_OTHER): Payer: BC Managed Care – PPO | Admitting: Internal Medicine

## 2013-09-28 ENCOUNTER — Other Ambulatory Visit (INDEPENDENT_AMBULATORY_CARE_PROVIDER_SITE_OTHER): Payer: BC Managed Care – PPO

## 2013-09-28 VITALS — BP 130/80 | HR 80 | Temp 98.3°F | Ht 70.0 in | Wt 171.2 lb

## 2013-09-28 DIAGNOSIS — Z0000 Encounter for general adult medical examination without abnormal findings: Secondary | ICD-10-CM

## 2013-09-28 DIAGNOSIS — R972 Elevated prostate specific antigen [PSA]: Secondary | ICD-10-CM

## 2013-09-28 DIAGNOSIS — Z136 Encounter for screening for cardiovascular disorders: Secondary | ICD-10-CM

## 2013-09-28 DIAGNOSIS — Z23 Encounter for immunization: Secondary | ICD-10-CM

## 2013-09-28 DIAGNOSIS — I10 Essential (primary) hypertension: Secondary | ICD-10-CM

## 2013-09-28 LAB — CBC WITH DIFFERENTIAL/PLATELET
Basophils Absolute: 0 10*3/uL (ref 0.0–0.1)
Basophils Relative: 0.9 % (ref 0.0–3.0)
Eosinophils Absolute: 0.2 10*3/uL (ref 0.0–0.7)
Eosinophils Relative: 4.2 % (ref 0.0–5.0)
HCT: 40.3 % (ref 39.0–52.0)
HEMOGLOBIN: 13.5 g/dL (ref 13.0–17.0)
Lymphocytes Relative: 37.4 % (ref 12.0–46.0)
Lymphs Abs: 2.1 10*3/uL (ref 0.7–4.0)
MCHC: 33.4 g/dL (ref 30.0–36.0)
MCV: 92.7 fl (ref 78.0–100.0)
MONOS PCT: 7.8 % (ref 3.0–12.0)
Monocytes Absolute: 0.4 10*3/uL (ref 0.1–1.0)
NEUTROS ABS: 2.8 10*3/uL (ref 1.4–7.7)
NEUTROS PCT: 49.7 % (ref 43.0–77.0)
Platelets: 259 10*3/uL (ref 150.0–400.0)
RBC: 4.35 Mil/uL (ref 4.22–5.81)
RDW: 15.8 % — ABNORMAL HIGH (ref 11.5–14.6)
WBC: 5.6 10*3/uL (ref 4.5–10.5)

## 2013-09-28 LAB — HEPATIC FUNCTION PANEL
ALBUMIN: 3.9 g/dL (ref 3.5–5.2)
ALK PHOS: 50 U/L (ref 39–117)
ALT: 12 U/L (ref 0–53)
AST: 22 U/L (ref 0–37)
BILIRUBIN DIRECT: 0.1 mg/dL (ref 0.0–0.3)
BILIRUBIN TOTAL: 0.8 mg/dL (ref 0.3–1.2)
Total Protein: 7 g/dL (ref 6.0–8.3)

## 2013-09-28 LAB — BASIC METABOLIC PANEL
BUN: 13 mg/dL (ref 6–23)
CALCIUM: 9.1 mg/dL (ref 8.4–10.5)
CHLORIDE: 109 meq/L (ref 96–112)
CO2: 28 mEq/L (ref 19–32)
CREATININE: 1 mg/dL (ref 0.4–1.5)
GFR: 96.74 mL/min (ref >60.00)
GLUCOSE: 95 mg/dL (ref 70–99)
Potassium: 4.6 mEq/L (ref 3.5–5.1)
Sodium: 142 mEq/L (ref 135–145)

## 2013-09-28 LAB — URINALYSIS, ROUTINE W REFLEX MICROSCOPIC
Bilirubin Urine: NEGATIVE
Hgb urine dipstick: NEGATIVE
KETONES UR: NEGATIVE
Leukocytes, UA: NEGATIVE
Nitrite: NEGATIVE
PH: 6 (ref 5.0–8.0)
RBC / HPF: NONE SEEN (ref 0–?)
Total Protein, Urine: NEGATIVE
URINE GLUCOSE: NEGATIVE
Urobilinogen, UA: 0.2 (ref 0.0–1.0)

## 2013-09-28 LAB — LIPID PANEL
CHOL/HDL RATIO: 3
Cholesterol: 143 mg/dL (ref 0–200)
HDL: 44.1 mg/dL (ref >39.00)
LDL CALC: 88 mg/dL (ref 0–99)
Triglycerides: 55 mg/dL (ref 0.0–149.0)
VLDL: 11 mg/dL (ref 0.0–40.0)

## 2013-09-28 LAB — PSA: PSA: 7.72 ng/mL — AB (ref 0.10–4.00)

## 2013-09-28 LAB — TSH: TSH: 0.97 u[IU]/mL (ref 0.35–5.50)

## 2013-09-28 MED ORDER — DOXYCYCLINE HYCLATE 100 MG PO TABS: 100.0000 mg | ORAL_TABLET | Freq: Two times a day (BID) | ORAL | Status: DC

## 2013-09-28 MED ORDER — AMLODIPINE BESY-BENAZEPRIL HCL 10-20 MG PO CAPS: ORAL_CAPSULE | ORAL | Status: DC

## 2013-09-28 NOTE — Patient Instructions (Addendum)
You had the flu shot today Your ECG was OK today Please call [employer] cust service to see if your plan covers the shingles shot; if so, please make nurse visit for the shot Please continue all other medications as before, and refills have been done if requested. Please have the pharmacy call with any other refills you may need. Please continue your efforts at being more active, low cholesterol diet, and weight control. You are otherwise up to date with prevention measures today. Please keep your appointments with your specialists as you may have planned  Please go to the LAB in the Basement (turn left off the elevator) for the tests to be done today You will be contacted by phone if any changes need to be made immediately.  Otherwise, you will receive a letter about your results with an explanation, but please check with MyChart first.  Remember, if your generic Lotrel medication is still expensive with the [place], you should let us know, and we can change to make it 2 less expensive prescriptions  Please return in 1 year for your yearly visit, or sooner if needed

## 2013-09-28 NOTE — Assessment & Plan Note (Signed)

## 2013-09-28 NOTE — Progress Notes (Signed)
Subjective:    Patient ID: Joshua Curtis, male    DOB: [birth year], 63 y.o.   MRN: [id number]  HPI Here for wellness and f/u;  Overall doing ok;  Pt denies CP, worsening SOB, DOE, wheezing, orthopnea, PND, worsening LE edema, palpitations, dizziness or syncope.  Pt denies neurological change such as new headache, facial or extremity weakness.  Pt denies polydipsia, polyuria, or low sugar symptoms. Pt states overall good compliance with treatment and medications, good tolerability, and has been trying to follow lower cholesterol diet.  Pt denies worsening depressive symptoms, suicidal ideation or panic. No fever, night sweats, wt loss, loss of appetite, or other constitutional symptoms.  Pt states good ability with ADL's, has low fall risk, home safety reviewed and adequate, no other significant changes in hearing or vision, and only occasionally active with exercise.  Due for flu shot today.  Did see urology after elev PSA, had prostate biopsy  - neg for malignancy. No planned f/u at this time per pt Past Medical History  Diagnosis Date  . COLONIC POLYPS, HX OF 11/24/2007  . DIVERTICULOSIS, COLON 11/24/2007  . HYPERTENSION 11/24/2007  . ED (erectile dysfunction)   . Increased prostate specific antigen (PSA) velocity 06/22/2011   No past surgical history on file.  reports that he has been smoking.  He does not have any smokeless tobacco history on file. He reports that he drinks alcohol. He reports that he does not use illicit drugs. family history includes Diabetes in his other; Hypertension in his other; Stroke in his other. No Known Allergies Current Outpatient Prescriptions on File Prior to Visit  Medication Sig Dispense Refill  . amLODipine-benazepril (LOTREL) 10-20 MG per capsule TAKE ONE CAPSULE BY MOUTH EVERY DAY  90 capsule  0  . aspirin 81 MG tablet Take 81 mg by mouth daily.         No current facility-administered medications on file prior to visit.    Review of  Systems Constitutional: Negative for diaphoresis, activity change, appetite change or unexpected weight change.  HENT: Negative for hearing loss, ear pain, facial swelling, mouth sores and neck stiffness.   Eyes: Negative for pain, redness and visual disturbance.  Respiratory: Negative for shortness of breath and wheezing.   Cardiovascular: Negative for chest pain and palpitations.  Gastrointestinal: Negative for diarrhea, blood in stool, abdominal distention or other pain Genitourinary: Negative for hematuria, flank pain or change in urine volume.  Musculoskeletal: Negative for myalgias and joint swelling.  Skin: Negative for color change and wound.  Neurological: Negative for syncope and numbness. other than noted Hematological: Negative for adenopathy.  Psychiatric/Behavioral: Negative for hallucinations, self-injury, decreased concentration and agitation.      Objective:   Physical Exam BP 130/80  Pulse 80  Temp(Src) 98.3 F (36.8 C) (Oral)  Ht 5\' 10"  (1.778 m)  Wt 171 lb 4 oz (77.678 kg)  BMI 24.57 kg/m2  SpO2 98% VS noted,  Constitutional: Pt is oriented to person, place, and time. Appears well-developed and well-nourished.  Head: Normocephalic and atraumatic.  Right Ear: External ear normal.  Left Ear: External ear normal.  Nose: Nose normal.  Mouth/Throat: Oropharynx is clear and moist.  Eyes: Conjunctivae and EOM are normal. Pupils are equal, round, and reactive to light.  Neck: Normal range of motion. Neck supple. No JVD present. No tracheal deviation present.  Cardiovascular: Normal rate, regular rhythm, normal heart sounds and intact distal pulses.   Pulmonary/Chest: Effort normal and breath sounds normal.  Abdominal: Soft.  Bowel sounds are normal. There is no tenderness. No HSM  Musculoskeletal: Normal range of motion. Exhibits no edema.  Lymphadenopathy:  Has no cervical adenopathy.  Neurological: Pt is alert and oriented to person, place, and time. Pt has normal  reflexes. No cranial nerve deficit.  Skin: Skin is warm and dry. No rash noted.  Psychiatric:  Has  normal mood and affect. Behavior is normal.     Assessment & Plan:

## 2013-09-28 NOTE — Progress Notes (Signed)
Pre-visit discussion using our clinic review tool. No additional management support is needed unless otherwise documented below in the visit note.  

## 2013-09-28 NOTE — Assessment & Plan Note (Signed)
stable overall by history and exam, recent data reviewed with pt, and pt to continue medical treatment as before,  to f/u any worsening symptoms or concerns BP Readings from Last 3 Encounters:  09/28/13 130/80  09/25/12 140/72  06/21/11 122/72

## 2013-10-01 ENCOUNTER — Telehealth: Payer: Self-pay | Admitting: Internal Medicine

## 2013-10-01 NOTE — Telephone Encounter (Signed)
Called the patient back informed once complete antibiotics return to the lab to recheck PSA. PCP stated approximately in 3 weeks.   Patient understood and agreed to do so.

## 2013-10-01 NOTE — Telephone Encounter (Signed)
Please call patient to clarify if he needs to come back in.  Call on cell [phone]. TP

## 2013-10-26 ENCOUNTER — Telehealth: Payer: Self-pay

## 2013-10-26 DIAGNOSIS — R972 Elevated prostate specific antigen [PSA]: Secondary | ICD-10-CM

## 2013-10-26 NOTE — Telephone Encounter (Signed)
Lab for return on PSA entered

## 2013-11-01 ENCOUNTER — Other Ambulatory Visit (INDEPENDENT_AMBULATORY_CARE_PROVIDER_SITE_OTHER): Payer: BC Managed Care – PPO

## 2013-11-01 DIAGNOSIS — R972 Elevated prostate specific antigen [PSA]: Secondary | ICD-10-CM

## 2013-11-01 LAB — PSA: PSA: 6.45 ng/mL — ABNORMAL HIGH (ref 0.10–4.00)

## 2013-11-02 ENCOUNTER — Encounter: Payer: Self-pay | Admitting: Internal Medicine

## 2013-11-02 ENCOUNTER — Telehealth: Payer: Self-pay | Admitting: Internal Medicine

## 2013-11-02 DIAGNOSIS — R972 Elevated prostate specific antigen [PSA]: Secondary | ICD-10-CM

## 2013-11-02 NOTE — Telephone Encounter (Signed)
Robin to fax psa results to Dr Karsten Ro as well/urology

## 2013-11-02 NOTE — Telephone Encounter (Signed)
Results sent to Dr. Karsten Ro

## 2014-04-29 ENCOUNTER — Telehealth: Payer: Self-pay | Admitting: Internal Medicine

## 2014-04-29 NOTE — Telephone Encounter (Signed)
Wellness form faxed to [employer] on 04/25/14. Fax#: [phone]. Attempted to call pt and mobile and home numbers listed. No answer, unable to leave VM on home number because mail box is full.   There is also a note in pt FYI about this.

## 2014-04-29 NOTE — Telephone Encounter (Signed)
Pt is checking the status of paperwork, screening process called "Know your Numbers"?  He said it's due by 05/03/14, he dropped it off on 04/20/14.

## 2014-09-29 ENCOUNTER — Ambulatory Visit (INDEPENDENT_AMBULATORY_CARE_PROVIDER_SITE_OTHER): Payer: BLUE CROSS/BLUE SHIELD | Admitting: Internal Medicine

## 2014-09-29 ENCOUNTER — Encounter: Payer: Self-pay | Admitting: Internal Medicine

## 2014-09-29 VITALS — BP 172/80 | HR 79 | Temp 97.6°F | Ht 70.0 in | Wt 172.0 lb

## 2014-09-29 DIAGNOSIS — I10 Essential (primary) hypertension: Secondary | ICD-10-CM

## 2014-09-29 DIAGNOSIS — Z0000 Encounter for general adult medical examination without abnormal findings: Secondary | ICD-10-CM

## 2014-09-29 DIAGNOSIS — Z23 Encounter for immunization: Secondary | ICD-10-CM

## 2014-09-29 MED ORDER — AMLODIPINE BESY-BENAZEPRIL HCL 10-40 MG PO CAPS: 1.0000 | ORAL_CAPSULE | Freq: Every day | ORAL | Status: DC

## 2014-09-29 NOTE — Patient Instructions (Addendum)
You had the flu shot today  OK to stop the amlodipine/benazepril 10/20  Please take all new medication as prescribed - the amlodipine/benazepril 10/40 mg  Please continue all other medications as before, and refills have been done if requested.  Please have the pharmacy call with any other refills you may need.  Please continue your efforts at being more active, low cholesterol diet, and weight control.  You are otherwise up to date with prevention measures today.  Please keep your appointments with your specialists as you may have planned  Please go to the LAB in the Basement (turn left off the elevator) for the tests to be done IN 10 DAYS (or soon thereafter)  You will be contacted by phone if any changes need to be made immediately.  Otherwise, you will receive a letter about your results with an explanation, but please check with MyChart first.  Please remember to sign up for MyChart if you have not done so, as this will be important to you in the future with finding out test results, communicating by private email, and scheduling acute appointments online when needed.  Please return in 6 months, or sooner if needed  Please quit smoking

## 2014-09-29 NOTE — Progress Notes (Signed)
Subjective:    Patient ID: Joshua Curtis, male    DOB: [birth year], 64 y.o.   MRN: [id number]  HPI Here for wellness and f/u;  Overall doing ok;  Pt denies CP, worsening SOB, DOE, wheezing, orthopnea, PND, worsening LE edema, palpitations, dizziness or syncope.  Pt denies neurological change such as new headache, facial or extremity weakness.  Pt denies polydipsia, polyuria, or low sugar symptoms. Pt states overall good compliance with treatment and medications, good tolerability, and has been trying to follow lower cholesterol diet.  Pt denies worsening depressive symptoms, suicidal ideation or panic. No fever, night sweats, wt loss, loss of appetite, or other constitutional symptoms.  Pt states good ability with ADL's, has low fall risk, home safety reviewed and adequate, no other significant changes in hearing or vision, and only occasionally active with exercise.  [facility] is usually 140-150 range,some stressed today. Did have right nosebleed that woke him up last wk, overall twice in 1 mo.  Past Medical History  Diagnosis Date  . COLONIC POLYPS, HX OF 11/24/2007  . DIVERTICULOSIS, COLON 11/24/2007  . HYPERTENSION 11/24/2007  . ED (erectile dysfunction)   . Increased prostate specific antigen (PSA) velocity 06/22/2011   No past surgical history on file.  reports that he has been smoking.  He does not have any smokeless tobacco history on file. He reports that he drinks alcohol. He reports that he does not use illicit drugs. family history includes Diabetes in his other; Hypertension in his other; Stroke in his other. No Known Allergies Current Outpatient Prescriptions on File Prior to Visit  Medication Sig Dispense Refill  . aspirin 81 MG tablet Take 81 mg by mouth daily.       No current facility-administered medications on file prior to visit.   Review of Systems Constitutional: Negative for increased diaphoresis, other activity, appetite or other siginficant weight change  HENT:  Negative for worsening hearing loss, ear pain, facial swelling, mouth sores and neck stiffness.   Eyes: Negative for other worsening pain, redness or visual disturbance.  Respiratory: Negative for shortness of breath and wheezing.   Cardiovascular: Negative for chest pain and palpitations.  Gastrointestinal: Negative for diarrhea, blood in stool, abdominal distention or other pain Genitourinary: Negative for hematuria, flank pain or change in urine volume.  Musculoskeletal: Negative for myalgias or other joint complaints.  Skin: Negative for color change and wound.  Neurological: Negative for syncope and numbness. other than noted Hematological: Negative for adenopathy. or other swelling Psychiatric/Behavioral: Negative for hallucinations, self-injury, decreased concentration or other worsening agitation.      Objective:   Physical Exam BP 172/80 mmHg  Pulse 79  Temp(Src) 97.6 F (36.4 C) (Oral)  Ht 5\' 10"  (1.778 m)  Wt 172 lb (78.019 kg)  BMI 24.68 kg/m2  SpO2 99% VS noted,  Constitutional: Pt is oriented to person, place, and time. Appears well-developed and well-nourished.  Head: Normocephalic and atraumatic.  Right Ear: External ear normal.  Left Ear: External ear normal.  Nose: Nose normal.  Mouth/Throat: Oropharynx is clear and moist.  Eyes: Conjunctivae and EOM are normal. Pupils are equal, round, and reactive to light.  Neck: Normal range of motion. Neck supple. No JVD present. No tracheal deviation present.  Cardiovascular: Normal rate, regular rhythm, normal heart sounds and intact distal pulses.   Pulmonary/Chest: Effort normal and breath sounds without rales or wheezing  Abdominal: Soft. Bowel sounds are normal. NT. No HSM  Musculoskeletal: Normal range of motion. Exhibits no  edema.  Lymphadenopathy:  Has no cervical adenopathy.  Neurological: Pt is alert and oriented to person, place, and time. Pt has normal reflexes. No cranial nerve deficit. Motor grossly  intact Skin: Skin is warm and dry. No rash noted.  Psychiatric:  Has normal mood and affect. Behavior is normal.     Assessment & Plan:

## 2014-09-29 NOTE — Assessment & Plan Note (Signed)
Mild uncontrolled, for increase amlod-benaz to 10 /40 - 1 qd, f/u bun/cr in 1 wk wit routine labs

## 2014-09-29 NOTE — Progress Notes (Signed)
Pre visit review using our clinic review tool, if applicable. No additional management support is needed unless otherwise documented below in the visit note. 

## 2014-09-29 NOTE — Assessment & Plan Note (Signed)

## 2014-09-30 ENCOUNTER — Telehealth: Payer: Self-pay | Admitting: Internal Medicine

## 2014-09-30 NOTE — Telephone Encounter (Signed)
emmi emailed °

## 2014-09-30 NOTE — Telephone Encounter (Signed)
emmi mailed  °

## 2014-10-27 ENCOUNTER — Encounter: Payer: Self-pay | Admitting: Internal Medicine

## 2014-10-27 ENCOUNTER — Other Ambulatory Visit (INDEPENDENT_AMBULATORY_CARE_PROVIDER_SITE_OTHER): Payer: BLUE CROSS/BLUE SHIELD

## 2014-10-27 DIAGNOSIS — Z0000 Encounter for general adult medical examination without abnormal findings: Secondary | ICD-10-CM

## 2014-10-27 LAB — BASIC METABOLIC PANEL
BUN: 16 mg/dL (ref 6–23)
CHLORIDE: 109 meq/L (ref 96–112)
CO2: 28 mEq/L (ref 19–32)
Calcium: 9 mg/dL (ref 8.4–10.5)
Creatinine, Ser: 0.95 mg/dL (ref 0.40–1.50)
GFR: 103.45 mL/min (ref >60.00)
Glucose, Bld: 119 mg/dL — ABNORMAL HIGH (ref 70–99)
POTASSIUM: 4.6 meq/L (ref 3.5–5.1)
Sodium: 140 mEq/L (ref 135–145)

## 2014-10-27 LAB — URINALYSIS, ROUTINE W REFLEX MICROSCOPIC
Bilirubin Urine: NEGATIVE
HGB URINE DIPSTICK: NEGATIVE
Ketones, ur: NEGATIVE
LEUKOCYTES UA: NEGATIVE
NITRITE: NEGATIVE
Specific Gravity, Urine: >=1.03 — AB (ref 1.000–1.030)
Total Protein, Urine: NEGATIVE
URINE GLUCOSE: NEGATIVE
UROBILINOGEN UA: 0.2 (ref 0.0–1.0)
pH: 6 (ref 5.0–8.0)

## 2014-10-27 LAB — HEPATIC FUNCTION PANEL
ALK PHOS: 66 U/L (ref 39–117)
ALT: 9 U/L (ref 0–53)
AST: 16 U/L (ref 0–37)
Albumin: 4.1 g/dL (ref 3.5–5.2)
BILIRUBIN TOTAL: 0.4 mg/dL (ref 0.2–1.2)
Bilirubin, Direct: 0.1 mg/dL (ref 0.0–0.3)
Total Protein: 6.7 g/dL (ref 6.0–8.3)

## 2014-10-27 LAB — TSH: TSH: 0.83 u[IU]/mL (ref 0.35–4.50)

## 2014-10-27 LAB — CBC WITH DIFFERENTIAL/PLATELET
Basophils Absolute: 0.1 10*3/uL (ref 0.0–0.1)
Basophils Relative: 0.9 % (ref 0.0–3.0)
EOS ABS: 0.2 10*3/uL (ref 0.0–0.7)
Eosinophils Relative: 3.6 % (ref 0.0–5.0)
HCT: 35.5 % — ABNORMAL LOW (ref 39.0–52.0)
HEMOGLOBIN: 11.7 g/dL — AB (ref 13.0–17.0)
LYMPHS PCT: 35.3 % (ref 12.0–46.0)
Lymphs Abs: 2.1 10*3/uL (ref 0.7–4.0)
MCHC: 33 g/dL (ref 30.0–36.0)
MCV: 83.5 fl (ref 78.0–100.0)
MONO ABS: 0.6 10*3/uL (ref 0.1–1.0)
Monocytes Relative: 9.5 % (ref 3.0–12.0)
NEUTROS ABS: 3 10*3/uL (ref 1.4–7.7)
Neutrophils Relative %: 50.7 % (ref 43.0–77.0)
Platelets: 290 10*3/uL (ref 150.0–400.0)
RBC: 4.25 Mil/uL (ref 4.22–5.81)
RDW: 20.9 % — ABNORMAL HIGH (ref 11.5–15.5)
WBC: 5.9 10*3/uL (ref 4.0–10.5)

## 2014-10-27 LAB — LIPID PANEL
CHOLESTEROL: 136 mg/dL (ref 0–200)
HDL: 41.5 mg/dL (ref >39.00)
LDL Cholesterol: 85 mg/dL (ref 0–99)
NonHDL: 94.5
Total CHOL/HDL Ratio: 3
Triglycerides: 48 mg/dL (ref 0.0–149.0)
VLDL: 9.6 mg/dL (ref 0.0–40.0)

## 2014-10-27 LAB — PSA: PSA: 5.51 ng/mL — AB (ref 0.10–4.00)

## 2015-03-10 ENCOUNTER — Encounter: Payer: Self-pay | Admitting: Internal Medicine

## 2015-03-30 ENCOUNTER — Other Ambulatory Visit (INDEPENDENT_AMBULATORY_CARE_PROVIDER_SITE_OTHER): Payer: BLUE CROSS/BLUE SHIELD

## 2015-03-30 ENCOUNTER — Encounter: Payer: Self-pay | Admitting: Internal Medicine

## 2015-03-30 ENCOUNTER — Ambulatory Visit (INDEPENDENT_AMBULATORY_CARE_PROVIDER_SITE_OTHER): Payer: BLUE CROSS/BLUE SHIELD | Admitting: Internal Medicine

## 2015-03-30 VITALS — BP 158/78 | HR 77 | Temp 97.7°F | Ht 70.0 in | Wt 163.0 lb

## 2015-03-30 DIAGNOSIS — D649 Anemia, unspecified: Secondary | ICD-10-CM | POA: Diagnosis not present

## 2015-03-30 DIAGNOSIS — I10 Essential (primary) hypertension: Secondary | ICD-10-CM

## 2015-03-30 DIAGNOSIS — Z0189 Encounter for other specified special examinations: Secondary | ICD-10-CM | POA: Diagnosis not present

## 2015-03-30 DIAGNOSIS — R739 Hyperglycemia, unspecified: Secondary | ICD-10-CM | POA: Insufficient documentation

## 2015-03-30 DIAGNOSIS — R972 Elevated prostate specific antigen [PSA]: Secondary | ICD-10-CM

## 2015-03-30 DIAGNOSIS — Z0000 Encounter for general adult medical examination without abnormal findings: Secondary | ICD-10-CM

## 2015-03-30 LAB — TSH: TSH: 0.72 u[IU]/mL (ref 0.35–4.50)

## 2015-03-30 LAB — HEMOGLOBIN A1C: HEMOGLOBIN A1C: 6 % (ref 4.6–6.5)

## 2015-03-30 LAB — URINALYSIS, ROUTINE W REFLEX MICROSCOPIC
BILIRUBIN URINE: NEGATIVE
Ketones, ur: NEGATIVE
Leukocytes, UA: NEGATIVE
Nitrite: NEGATIVE
Specific Gravity, Urine: 1.025 (ref 1.000–1.030)
TOTAL PROTEIN, URINE-UPE24: NEGATIVE
UROBILINOGEN UA: 0.2 (ref 0.0–1.0)
Urine Glucose: NEGATIVE
pH: 6 (ref 5.0–8.0)

## 2015-03-30 LAB — CBC WITH DIFFERENTIAL/PLATELET
BASOS ABS: 0.1 10*3/uL (ref 0.0–0.1)
Basophils Relative: 1.2 % (ref 0.0–3.0)
EOS ABS: 0.3 10*3/uL (ref 0.0–0.7)
Eosinophils Relative: 5.3 % — ABNORMAL HIGH (ref 0.0–5.0)
HEMATOCRIT: 33.9 % — AB (ref 39.0–52.0)
Hemoglobin: 11.1 g/dL — ABNORMAL LOW (ref 13.0–17.0)
LYMPHS ABS: 1.6 10*3/uL (ref 0.7–4.0)
Lymphocytes Relative: 29.5 % (ref 12.0–46.0)
MCHC: 32.7 g/dL (ref 30.0–36.0)
MCV: 83.5 fl (ref 78.0–100.0)
MONOS PCT: 10.4 % (ref 3.0–12.0)
Monocytes Absolute: 0.6 10*3/uL (ref 0.1–1.0)
NEUTROS ABS: 3 10*3/uL (ref 1.4–7.7)
NEUTROS PCT: 53.6 % (ref 43.0–77.0)
PLATELETS: 318 10*3/uL (ref 150.0–400.0)
RBC: 4.06 Mil/uL — ABNORMAL LOW (ref 4.22–5.81)
RDW: 19 % — AB (ref 11.5–15.5)
WBC: 5.5 10*3/uL (ref 4.0–10.5)

## 2015-03-30 LAB — IBC PANEL
Iron: 63 ug/dL (ref 42–165)
SATURATION RATIOS: 11.8 % — AB (ref 20.0–50.0)
TRANSFERRIN: 382 mg/dL — AB (ref 212.0–360.0)

## 2015-03-30 LAB — BASIC METABOLIC PANEL
BUN: 15 mg/dL (ref 6–23)
CHLORIDE: 110 meq/L (ref 96–112)
CO2: 25 mEq/L (ref 19–32)
Calcium: 8.7 mg/dL (ref 8.4–10.5)
Creatinine, Ser: 0.89 mg/dL (ref 0.40–1.50)
GFR: 111.38 mL/min (ref >60.00)
GLUCOSE: 108 mg/dL — AB (ref 70–99)
Potassium: 4.7 mEq/L (ref 3.5–5.1)
Sodium: 140 mEq/L (ref 135–145)

## 2015-03-30 LAB — HEPATIC FUNCTION PANEL
ALBUMIN: 3.9 g/dL (ref 3.5–5.2)
ALK PHOS: 70 U/L (ref 39–117)
ALT: 9 U/L (ref 0–53)
AST: 18 U/L (ref 0–37)
Bilirubin, Direct: 0.1 mg/dL (ref 0.0–0.3)
Total Bilirubin: 0.5 mg/dL (ref 0.2–1.2)
Total Protein: 6.9 g/dL (ref 6.0–8.3)

## 2015-03-30 LAB — LIPID PANEL
CHOL/HDL RATIO: 3
Cholesterol: 131 mg/dL (ref 0–200)
HDL: 45.6 mg/dL (ref >39.00)
LDL Cholesterol: 72 mg/dL (ref 0–99)
NonHDL: 85.65
TRIGLYCERIDES: 68 mg/dL (ref 0.0–149.0)
VLDL: 13.6 mg/dL (ref 0.0–40.0)

## 2015-03-30 LAB — PSA: PSA: 7.21 ng/mL — ABNORMAL HIGH (ref 0.10–4.00)

## 2015-03-30 LAB — VITAMIN B12: Vitamin B-12: 297 pg/mL (ref 211–911)

## 2015-03-30 MED ORDER — HYDROCHLOROTHIAZIDE 12.5 MG PO CAPS: 12.5000 mg | ORAL_CAPSULE | Freq: Every day | ORAL | Status: DC

## 2015-03-30 NOTE — Assessment & Plan Note (Signed)
Mild uncontrolled, to add hct 12.5 qd, cont all other meds, f/u BP at home and next visit,  to f/u any worsening symptoms or concerns

## 2015-03-30 NOTE — Assessment & Plan Note (Signed)
Lab Results  Component Value Date   PSA 5.51* 10/27/2014   PSA 6.45* 11/01/2013   PSA 7.72* 09/28/2013   For f/u psa today, is not seeing urology regularly recently, hx of neg prostate bx 2013

## 2015-03-30 NOTE — Progress Notes (Signed)
Pre visit review using our clinic review tool, if applicable. No additional management support is needed unless otherwise documented below in the visit note. 

## 2015-03-30 NOTE — Assessment & Plan Note (Signed)
Lab Results  Component Value Date   WBC 5.9 10/27/2014   HGB 11.7* 10/27/2014   HCT 35.5* 10/27/2014   PLT 290.0 10/27/2014   GLUCOSE 119* 10/27/2014   CHOL 136 10/27/2014   TRIG 48.0 10/27/2014   HDL 41.50 10/27/2014   LDLCALC 85 10/27/2014   ALT 9 10/27/2014   AST 16 10/27/2014   NA 140 10/27/2014   K 4.6 10/27/2014   CL 109 10/27/2014   CREATININE 0.95 10/27/2014   BUN 16 10/27/2014   CO2 28 10/27/2014   TSH 0.83 10/27/2014   PSA 5.51* 10/27/2014   ? Spurious elev last visit, for f/u glc/a1c

## 2015-03-30 NOTE — Progress Notes (Signed)
   Subjective:    Patient ID: Joshua Curtis, male    DOB: 1950/11/18, 64 y.o.   MRN: [id number]  HPI  Here to f/u; overall doing ok,  Pt denies chest pain, increasing sob or doe, wheezing, orthopnea, PND, increased LE swelling, palpitations, dizziness or syncope.  Pt denies new neurological symptoms such as new headache, or facial or extremity weakness or numbness.  Pt denies polydipsia, polyuria, or low sugar episode.   Pt denies new neurological symptoms such as new headache, or facial or extremity weakness or numbness.   Pt states overall good compliance with meds, mostly trying to follow appropriate diet, with wt overall stable,  but little exercise however. Works as Freight forwarder at [employer]. [facility] have been up and down, mostly up  BP Readings from Last 3 Encounters:  03/30/15 158/78  09/29/14 172/80  09/28/13 130/80   Past Medical History  Diagnosis Date  . COLONIC POLYPS, HX OF 11/24/2007  . DIVERTICULOSIS, COLON 11/24/2007  . HYPERTENSION 11/24/2007  . ED (erectile dysfunction)   . Increased prostate specific antigen (PSA) velocity 06/22/2011   No past surgical history on file.  reports that he has been smoking.  He does not have any smokeless tobacco history on file. He reports that he drinks alcohol. He reports that he does not use illicit drugs. family history includes Diabetes in his other; Hypertension in his other; Stroke in his other. No Known Allergies Current Outpatient Prescriptions on File Prior to Visit  Medication Sig Dispense Refill  . amLODipine-benazepril (LOTREL) 10-40 MG per capsule Take 1 capsule by mouth daily. 90 capsule 3  . aspirin 81 MG tablet Take 81 mg by mouth daily.       No current facility-administered medications on file prior to visit.   Review of Systems  Constitutional: Negative for unusual diaphoresis or night sweats HENT: Negative for ringing in ear or discharge Eyes: Negative for double vision or worsening visual disturbance.    Respiratory: Negative for choking and stridor.   Gastrointestinal: Negative for vomiting or other signifcant bowel change Genitourinary: Negative for hematuria or change in urine volume.  Musculoskeletal: Negative for other MSK pain or swelling Skin: Negative for color change and worsening wound.  Neurological: Negative for tremors and numbness other than noted  Psychiatric/Behavioral: Negative for decreased concentration or agitation other than above       Objective:   Physical Exam BP 158/78 mmHg  Pulse 77  Temp(Src) 97.7 F (36.5 C) (Oral)  Ht 5\' 10"  (1.778 m)  Wt 163 lb (73.936 kg)  BMI 23.39 kg/m2  SpO2 99% VS noted,  Constitutional: Pt appears in no significant distress HENT: Head: NCAT.  Right Ear: External ear normal.  Left Ear: External ear normal.  Eyes: . Pupils are equal, round, and reactive to light. Conjunctivae and EOM are normal Neck: Normal range of motion. Neck supple.  Cardiovascular: Normal rate and regular rhythm.   Pulmonary/Chest: Effort normal and breath sounds without rales or wheezing.  Neurological: Pt is alert. Not confused , motor grossly intact Skin: Skin is warm. No rash, no LE edema Psychiatric: Pt behavior is normal. No agitation.      Assessment & Plan:

## 2015-03-30 NOTE — Patient Instructions (Signed)
Please take all new medication as prescribed - the fluid pill for blood pressure  Please continue all other medications as before, and refills have been done if requested.  Please have the pharmacy call with any other refills you may need.  Please continue your efforts at being more active, low cholesterol diet, and weight control.  Please keep your appointments with your specialists as you may have planned  Please go to the LAB in the Basement (turn left off the elevator) for the tests to be done today  You will be contacted by phone if any changes need to be made immediately.  Otherwise, you will receive a letter about your results with an explanation, but please check with MyChart first.  Please remember to sign up for MyChart if you have not done so, as this will be important to you in the future with finding out test results, communicating by private email, and scheduling acute appointments online when needed.  Please return in 6 months, or sooner if needed, with Lab testing done 3-5 days before

## 2015-03-30 NOTE — Assessment & Plan Note (Signed)
Lab Results  Component Value Date   WBC 5.9 10/27/2014   HGB 11.7* 10/27/2014   HCT 35.5* 10/27/2014   MCV 83.5 10/27/2014   PLT 290.0 10/27/2014   For f/u cbc, iron/b12,  to f/u any worsening symptoms or concerns

## 2015-08-15 ENCOUNTER — Other Ambulatory Visit: Payer: Self-pay | Admitting: Internal Medicine

## 2015-09-13 ENCOUNTER — Encounter: Payer: Self-pay | Admitting: Internal Medicine

## 2015-09-27 ENCOUNTER — Telehealth: Payer: Self-pay | Admitting: Internal Medicine

## 2015-09-27 MED ORDER — AMLODIPINE BESY-BENAZEPRIL HCL 10-40 MG PO CAPS
1.0000 | ORAL_CAPSULE | Freq: Every day | ORAL | Status: DC
Start: 2015-09-27 — End: 2016-01-27

## 2015-09-27 MED ORDER — HYDROCHLOROTHIAZIDE 12.5 MG PO CAPS: 12.5000 mg | ORAL_CAPSULE | Freq: Every day | ORAL | Status: DC

## 2015-09-27 NOTE — Telephone Encounter (Signed)
A user error has taken place.

## 2015-09-27 NOTE — Telephone Encounter (Signed)
Patient has appointment on 1/31.  He is running out of amlodipine and hydrochlorothiazide.  Can you please [facility] a [facility] [address] to get him through until his appointment?  Thanks!

## 2015-09-27 NOTE — Telephone Encounter (Signed)
Medications sent over for 30 day supply

## 2015-09-27 NOTE — Addendum Note (Signed)
Addended by: Della Goo C on: 09/27/2015 11:08 AM   Modules accepted: Orders

## 2015-09-29 ENCOUNTER — Telehealth: Payer: Self-pay

## 2015-09-29 MED ORDER — AMLODIPINE BESY-BENAZEPRIL HCL 10-40 MG PO CAPS: 1.0000 | ORAL_CAPSULE | Freq: Every day | ORAL | Status: DC

## 2015-09-29 MED ORDER — HYDROCHLOROTHIAZIDE 12.5 MG PO CAPS: 12.5000 mg | ORAL_CAPSULE | Freq: Every day | ORAL | Status: DC

## 2015-09-29 NOTE — Telephone Encounter (Signed)
Medication was sent to new pharmacy  ?

## 2015-09-29 NOTE — Telephone Encounter (Signed)
Pt called back to check on this request, we sent to [facility], please send [facility]AP [facility] [address] . Thanks

## 2015-10-03 ENCOUNTER — Other Ambulatory Visit (INDEPENDENT_AMBULATORY_CARE_PROVIDER_SITE_OTHER): Payer: BLUE CROSS/BLUE SHIELD

## 2015-10-03 ENCOUNTER — Encounter: Payer: Self-pay | Admitting: Internal Medicine

## 2015-10-03 ENCOUNTER — Ambulatory Visit (INDEPENDENT_AMBULATORY_CARE_PROVIDER_SITE_OTHER): Payer: BLUE CROSS/BLUE SHIELD | Admitting: Internal Medicine

## 2015-10-03 ENCOUNTER — Other Ambulatory Visit: Payer: Self-pay | Admitting: Internal Medicine

## 2015-10-03 VITALS — BP 142/84 | HR 80 | Temp 98.6°F | Resp 20 | Ht 70.0 in | Wt 170.0 lb

## 2015-10-03 DIAGNOSIS — Z0000 Encounter for general adult medical examination without abnormal findings: Secondary | ICD-10-CM

## 2015-10-03 DIAGNOSIS — R739 Hyperglycemia, unspecified: Secondary | ICD-10-CM

## 2015-10-03 DIAGNOSIS — R000 Tachycardia, unspecified: Secondary | ICD-10-CM

## 2015-10-03 DIAGNOSIS — R002 Palpitations: Secondary | ICD-10-CM | POA: Diagnosis not present

## 2015-10-03 DIAGNOSIS — I10 Essential (primary) hypertension: Secondary | ICD-10-CM

## 2015-10-03 DIAGNOSIS — Z23 Encounter for immunization: Secondary | ICD-10-CM | POA: Diagnosis not present

## 2015-10-03 DIAGNOSIS — R972 Elevated prostate specific antigen [PSA]: Secondary | ICD-10-CM

## 2015-10-03 LAB — HEPATIC FUNCTION PANEL
ALBUMIN: 4 g/dL (ref 3.5–5.2)
ALK PHOS: 54 U/L (ref 39–117)
ALT: 11 U/L (ref 0–53)
AST: 19 U/L (ref 0–37)
Bilirubin, Direct: 0.1 mg/dL (ref 0.0–0.3)
TOTAL PROTEIN: 6.7 g/dL (ref 6.0–8.3)
Total Bilirubin: 0.3 mg/dL (ref 0.2–1.2)

## 2015-10-03 LAB — CBC WITH DIFFERENTIAL/PLATELET
BASOS ABS: 0.1 10*3/uL (ref 0.0–0.1)
Basophils Relative: 1.7 % (ref 0.0–3.0)
EOS ABS: 0.5 10*3/uL (ref 0.0–0.7)
Eosinophils Relative: 8.1 % — ABNORMAL HIGH (ref 0.0–5.0)
HCT: 36 % — ABNORMAL LOW (ref 39.0–52.0)
HEMOGLOBIN: 11.7 g/dL — AB (ref 13.0–17.0)
LYMPHS ABS: 1.9 10*3/uL (ref 0.7–4.0)
Lymphocytes Relative: 29.2 % (ref 12.0–46.0)
MCHC: 32.4 g/dL (ref 30.0–36.0)
MCV: 85.4 fl (ref 78.0–100.0)
MONO ABS: 0.6 10*3/uL (ref 0.1–1.0)
Monocytes Relative: 9.4 % (ref 3.0–12.0)
NEUTROS PCT: 51.6 % (ref 43.0–77.0)
Neutro Abs: 3.3 10*3/uL (ref 1.4–7.7)
Platelets: 297 10*3/uL (ref 150.0–400.0)
RBC: 4.22 Mil/uL (ref 4.22–5.81)
RDW: 21.4 % — ABNORMAL HIGH (ref 11.5–15.5)
WBC: 6.4 10*3/uL (ref 4.0–10.5)

## 2015-10-03 LAB — LIPID PANEL
CHOL/HDL RATIO: 4
Cholesterol: 132 mg/dL (ref 0–200)
HDL: 36.6 mg/dL — ABNORMAL LOW (ref >39.00)
LDL Cholesterol: 63 mg/dL (ref 0–99)
NONHDL: 95.79
TRIGLYCERIDES: 164 mg/dL — AB (ref 0.0–149.0)
VLDL: 32.8 mg/dL (ref 0.0–40.0)

## 2015-10-03 LAB — URINALYSIS, ROUTINE W REFLEX MICROSCOPIC
Bilirubin Urine: NEGATIVE
Hgb urine dipstick: NEGATIVE
KETONES UR: NEGATIVE
LEUKOCYTES UA: NEGATIVE
Nitrite: NEGATIVE
RBC / HPF: NONE SEEN (ref 0–?)
Specific Gravity, Urine: 1.015 (ref 1.000–1.030)
TOTAL PROTEIN, URINE-UPE24: NEGATIVE
URINE GLUCOSE: NEGATIVE
UROBILINOGEN UA: 0.2 (ref 0.0–1.0)
pH: 6 (ref 5.0–8.0)

## 2015-10-03 LAB — BASIC METABOLIC PANEL
BUN: 20 mg/dL (ref 6–23)
CO2: 29 meq/L (ref 19–32)
Calcium: 9.2 mg/dL (ref 8.4–10.5)
Chloride: 105 mEq/L (ref 96–112)
Creatinine, Ser: 1.12 mg/dL (ref 0.40–1.50)
GFR: 85.29 mL/min (ref >60.00)
GLUCOSE: 78 mg/dL (ref 70–99)
POTASSIUM: 4.7 meq/L (ref 3.5–5.1)
SODIUM: 141 meq/L (ref 135–145)

## 2015-10-03 LAB — HEMOGLOBIN A1C: HEMOGLOBIN A1C: 6.2 % (ref 4.6–6.5)

## 2015-10-03 LAB — PSA: PSA: 8.31 ng/mL — AB (ref 0.10–4.00)

## 2015-10-03 LAB — TSH: TSH: 0.82 u[IU]/mL (ref 0.35–4.50)

## 2015-10-03 NOTE — Progress Notes (Signed)
Subjective:    Patient ID: Joshua Curtis, male    DOB: [birth year], 65 y.o.   MRN: [id number]  HPI  Here for wellness and f/u;  Overall doing ok;  Pt denies Chest pain, worsening SOB, DOE, wheezing, orthopnea, PND, worsening LE edema, dizziness or syncope, but does have heart racing with just sitting and walking that seems to last for up to 1 min, starts and stops rather suddenly and reminds him of the fast HR you get with jogging, but he was not exerting himself more than baseline.  Last TSH July 21016 normal.  Denies hyper or hypo thyroid symptoms such as voice, skin or hair change. No prior hx of same. .no recent ecg, echo or hx of arrhythmia.   Pt denies neurological change such as new headache, facial or extremity weakness.  Pt denies polydipsia, polyuria, or low sugar symptoms. Pt states overall good compliance with treatment and medications, good tolerability, and has been trying to follow appropriate diet.  Pt denies worsening depressive symptoms, suicidal ideation or panic. No fever, night sweats, wt loss, loss of appetite, or other constitutional symptoms.  Pt states good ability with ADL's, has low fall risk, home safety reviewed and adequate, no other significant changes in hearing or vision, and only occasionally active with exercise. Due for colonoscopy and flu shot, and hep c screen.  No new complaints Past Medical History  Diagnosis Date  . COLONIC POLYPS, HX OF 11/24/2007  . DIVERTICULOSIS, COLON 11/24/2007  . HYPERTENSION 11/24/2007  . ED (erectile dysfunction)   . Increased prostate specific antigen (PSA) velocity 06/22/2011   No past surgical history on file.  reports that he has been smoking.  He does not have any smokeless tobacco history on file. He reports that he drinks alcohol. He reports that he does not use illicit drugs. family history includes Diabetes in his other; Hypertension in his other; Stroke in his other. No Known Allergies Current Outpatient Prescriptions on  File Prior to Visit  Medication Sig Dispense Refill  . amLODipine-benazepril (LOTREL) 10-40 MG capsule Take 1 capsule by mouth daily. 30 capsule 0  . aspirin 81 MG tablet Take 81 mg by mouth daily.      . hydrochlorothiazide (MICROZIDE) 12.5 MG capsule Take 1 capsule (12.5 mg total) by mouth daily. 30 capsule 11   No current facility-administered medications on file prior to visit.   Review of Systems Constitutional: Negative for increased diaphoresis, other activity, appetite or siginficant weight change other than noted HENT: Negative for worsening hearing loss, ear pain, facial swelling, mouth sores and neck stiffness.   Eyes: Negative for other worsening pain, redness or visual disturbance.  Respiratory: Negative for shortness of breath and wheezing  Cardiovascular: Negative for chest pain and palpitations.  Gastrointestinal: Negative for diarrhea, blood in stool, abdominal distention or other pain Genitourinary: Negative for hematuria, flank pain or change in urine volume.  Musculoskeletal: Negative for myalgias or other joint complaints.  Skin: Negative for color change and wound or drainage.  Neurological: Negative for syncope and numbness. other than noted Hematological: Negative for adenopathy. or other swelling Psychiatric/Behavioral: Negative for hallucinations, SI, self-injury, decreased concentration or other worsening agitation.      Objective:   Physical Exam BP 142/84 mmHg  Pulse 80  Temp(Src) 98.6 F (37 C) (Oral)  Resp 20  Ht 5\' 10"  (1.778 m)  Wt 170 lb (77.111 kg)  BMI 24.39 kg/m2  SpO2 93% VS noted,  Constitutional: Pt is oriented to person, place,  and time. Appears well-developed and well-nourished, in no significant distress Head: Normocephalic and atraumatic.  Right Ear: External ear normal.  Left Ear: External ear normal.  Nose: Nose normal.  Mouth/Throat: Oropharynx is clear and moist.  Eyes: Conjunctivae and EOM are normal. Pupils are equal, round,  and reactive to light.  Neck: Normal range of motion. Neck supple. No JVD present. No tracheal deviation present or significant neck LA or mass Cardiovascular: Normal rate, regular rhythm, normal heart sounds and intact distal pulses.   Pulmonary/Chest: Effort normal and breath sounds without rales or wheezing  Abdominal: Soft. Bowel sounds are normal. NT. No HSM  Musculoskeletal: Normal range of motion. Exhibits no edema.  Lymphadenopathy:  Has no cervical adenopathy.  Neurological: Pt is alert and oriented to person, place, and time. Pt has normal reflexes. No cranial nerve deficit. Motor grossly intact Skin: Skin is warm and dry. No rash noted.  Psychiatric:  Has normal mood and affect. Behavior is normal.     Assessment & Plan:

## 2015-10-03 NOTE — Progress Notes (Signed)
Pre visit review using our clinic review tool, if applicable. No additional management support is needed unless otherwise documented below in the visit note. 

## 2015-10-03 NOTE — Patient Instructions (Addendum)
You had the flu shot today  Your EKG was OK today  Please continue all other medications as before, and refills have been done if requested.  Please have the pharmacy call with any other refills you may need.  Please continue your efforts at being more active, low cholesterol diet, and weight control.  You are otherwise up to date with prevention measures today.  Please keep your appointments with your specialists as you may have planned  You will be contacted regarding the referral for: Echocardiogram (ultrasound of the heart) and Cardiac Event Monitor, as well as colonoscopy  Please go to the LAB in the Basement (turn left off the elevator) for the tests to be done today  You will be contacted by phone if any changes need to be made immediately.  Otherwise, you will receive a letter about your results with an explanation, but please check with MyChart first.  Please remember to sign up for MyChart if you have not done so, as this will be important to you in the future with finding out test results, communicating by private email, and scheduling acute appointments online when needed.  Please return in 6 months, or sooner if needed

## 2015-10-03 NOTE — Assessment & Plan Note (Signed)
Mild previous, for f/u a1c

## 2015-10-03 NOTE — Assessment & Plan Note (Signed)
By hx c/w prob arrythmia recurrent such as svt or afib - for cardiac event monitor, echo, ECG reviewed as per emr, consider card referral

## 2015-10-03 NOTE — Assessment & Plan Note (Signed)
stable overall by history and exam, recent data reviewed with pt, and pt to continue medical treatment as before,  to f/u any worsening symptoms or concerns BP Readings from Last 3 Encounters:  10/03/15 142/84  03/30/15 158/78  09/29/14 172/80

## 2015-10-03 NOTE — Assessment & Plan Note (Addendum)

## 2015-10-04 LAB — HEPATITIS C ANTIBODY: HCV Ab: NEGATIVE

## 2016-01-01 DIAGNOSIS — H5213 Myopia, bilateral: Secondary | ICD-10-CM | POA: Diagnosis not present

## 2016-01-27 ENCOUNTER — Other Ambulatory Visit: Payer: Self-pay | Admitting: Internal Medicine

## 2016-02-01 ENCOUNTER — Telehealth: Payer: Self-pay | Admitting: Internal Medicine

## 2016-02-01 MED ORDER — AMLODIPINE BESY-BENAZEPRIL HCL 10-40 MG PO CAPS: 1.0000 | ORAL_CAPSULE | Freq: Every day | ORAL | Status: DC

## 2016-02-01 NOTE — Telephone Encounter (Signed)
Patient needs amlod/ benazprl - needs to go [facility].

## 2016-02-01 NOTE — Telephone Encounter (Signed)
Medication refill sent to pharmacy  

## 2016-02-27 ENCOUNTER — Telehealth: Payer: Self-pay | Admitting: Internal Medicine

## 2016-02-27 MED ORDER — AMLODIPINE BESY-BENAZEPRIL HCL 10-40 MG PO CAPS: ORAL_CAPSULE | ORAL | Status: DC

## 2016-02-27 NOTE — Telephone Encounter (Signed)
Pt request refill for amLODipine-benazepril (LOTREL) 10-40 MG capsule [facility] [address]. Pt is going out of town tonight if we can send it in ASAP. Please call him once its done.

## 2016-02-27 NOTE — Telephone Encounter (Signed)
[facility] 

## 2016-04-02 ENCOUNTER — Encounter: Payer: Self-pay | Admitting: Internal Medicine

## 2016-04-02 ENCOUNTER — Ambulatory Visit (INDEPENDENT_AMBULATORY_CARE_PROVIDER_SITE_OTHER): Payer: BLUE CROSS/BLUE SHIELD | Admitting: Internal Medicine

## 2016-04-02 ENCOUNTER — Other Ambulatory Visit: Payer: Self-pay | Admitting: Internal Medicine

## 2016-04-02 ENCOUNTER — Other Ambulatory Visit (INDEPENDENT_AMBULATORY_CARE_PROVIDER_SITE_OTHER): Payer: BLUE CROSS/BLUE SHIELD

## 2016-04-02 VITALS — BP 140/82 | HR 81 | Temp 98.7°F | Resp 20 | Wt 165.0 lb

## 2016-04-02 DIAGNOSIS — D509 Iron deficiency anemia, unspecified: Secondary | ICD-10-CM

## 2016-04-02 DIAGNOSIS — R000 Tachycardia, unspecified: Secondary | ICD-10-CM

## 2016-04-02 DIAGNOSIS — D649 Anemia, unspecified: Secondary | ICD-10-CM | POA: Diagnosis not present

## 2016-04-02 DIAGNOSIS — R739 Hyperglycemia, unspecified: Secondary | ICD-10-CM | POA: Diagnosis not present

## 2016-04-02 DIAGNOSIS — Z0001 Encounter for general adult medical examination with abnormal findings: Secondary | ICD-10-CM

## 2016-04-02 DIAGNOSIS — R972 Elevated prostate specific antigen [PSA]: Secondary | ICD-10-CM | POA: Diagnosis not present

## 2016-04-02 DIAGNOSIS — I10 Essential (primary) hypertension: Secondary | ICD-10-CM | POA: Diagnosis not present

## 2016-04-02 DIAGNOSIS — R6889 Other general symptoms and signs: Secondary | ICD-10-CM

## 2016-04-02 HISTORY — DX: Iron deficiency anemia, unspecified: D50.9

## 2016-04-02 LAB — CBC WITH DIFFERENTIAL/PLATELET
Basophils Absolute: 0 10*3/uL (ref 0.0–0.1)
Basophils Relative: 0.3 % (ref 0.0–3.0)
EOS ABS: 0.5 10*3/uL (ref 0.0–0.7)
Eosinophils Relative: 7.3 % — ABNORMAL HIGH (ref 0.0–5.0)
HCT: 37.2 % — ABNORMAL LOW (ref 39.0–52.0)
HEMOGLOBIN: 12.4 g/dL — AB (ref 13.0–17.0)
LYMPHS ABS: 2.1 10*3/uL (ref 0.7–4.0)
Lymphocytes Relative: 29.7 % (ref 12.0–46.0)
MCHC: 33.4 g/dL (ref 30.0–36.0)
MCV: 86 fl (ref 78.0–100.0)
MONO ABS: 0.6 10*3/uL (ref 0.1–1.0)
Monocytes Relative: 8.5 % (ref 3.0–12.0)
NEUTROS PCT: 54.2 % (ref 43.0–77.0)
Neutro Abs: 3.8 10*3/uL (ref 1.4–7.7)
Platelets: 305 10*3/uL (ref 150.0–400.0)
RBC: 4.32 Mil/uL (ref 4.22–5.81)
RDW: 19.9 % — ABNORMAL HIGH (ref 11.5–15.5)
WBC: 6.9 10*3/uL (ref 4.0–10.5)

## 2016-04-02 LAB — IBC PANEL
IRON: 11 ug/dL — AB (ref 42–165)
Saturation Ratios: 2.2 % — ABNORMAL LOW (ref 20.0–50.0)
TRANSFERRIN: 351 mg/dL (ref 212.0–360.0)

## 2016-04-02 LAB — BASIC METABOLIC PANEL
BUN: 14 mg/dL (ref 6–23)
CALCIUM: 8.5 mg/dL (ref 8.4–10.5)
CO2: 28 mEq/L (ref 19–32)
CREATININE: 1.01 mg/dL (ref 0.40–1.50)
Chloride: 103 mEq/L (ref 96–112)
GFR: 95.94 mL/min (ref >60.00)
GLUCOSE: 92 mg/dL (ref 70–99)
Potassium: 3.7 mEq/L (ref 3.5–5.1)
Sodium: 137 mEq/L (ref 135–145)

## 2016-04-02 LAB — HEMOGLOBIN A1C: Hgb A1c MFr Bld: 6.1 % (ref 4.6–6.5)

## 2016-04-02 NOTE — Progress Notes (Signed)
Pre visit review using our clinic review tool, if applicable. No additional management support is needed unless otherwise documented below in the visit note. 

## 2016-04-02 NOTE — Patient Instructions (Signed)
Please continue all other medications as before, and refills have been done if requested.  Please have the pharmacy call with any other refills you may need.  Please continue your efforts at being more active, low cholesterol diet, and weight control.  You are otherwise up to date with prevention measures today.  Please keep your appointments with your specialists as you may have planned  You will be contacted regarding the referral for: urology  Please go to the LAB in the Basement (turn left off the elevator) for the tests to be done today  You will be contacted by phone if any changes need to be made immediately.  Otherwise, you will receive a letter about your results with an explanation, but please check with MyChart first.  Please remember to sign up for MyChart if you have not done so, as this will be important to you in the future with finding out test results, communicating by private email, and scheduling acute appointments online when needed.  Please return in 6 months, or sooner if needed, with Lab testing done 3-5 days before

## 2016-04-02 NOTE — Progress Notes (Signed)
   Subjective:    Patient ID: Joshua Curtis, male    DOB: 1951/01/25, 65 y.o.   MRN: [id number]  HPI  Pt denies chest pain, increased sob or doe, wheezing, orthopnea, PND, increased LE swelling, palpitations, dizziness or syncope. No longer has the heart racing such as jan 2017, did not follow through on the event monitor, echo and card referral as there was an insurance issue. Pt denies new neurological symptoms such as new headache, or facial or extremity weakness or numbness  Pt denies polydipsia, polyuria  Denies urinary symptoms such as dysuria, frequency, urgency, flank pain, hematuria or n/v, fever, chills.  Has not followed through with urology for elev psa , again due to insurance issue per pt. Past Medical History:  Diagnosis Date  . COLONIC POLYPS, HX OF 11/24/2007  . DIVERTICULOSIS, COLON 11/24/2007  . ED (erectile dysfunction)   . HYPERTENSION 11/24/2007  . Increased prostate specific antigen (PSA) velocity 06/22/2011   No past surgical history on file.  reports that he has been smoking.  He does not have any smokeless tobacco history on file. He reports that he drinks alcohol. He reports that he does not use drugs. family history includes Diabetes in his other; Hypertension in his other; Stroke in his other. No Known Allergies Current Outpatient Prescriptions on File Prior to Visit  Medication Sig Dispense Refill  . amLODipine-benazepril (LOTREL) 10-40 MG capsule Take 1 capsule by mouth daily. 30 capsule 0  . amLODipine-benazepril (LOTREL) 10-40 MG capsule Take 1 capsule by mouth  daily 30 capsule 3  . aspirin 81 MG tablet Take 81 mg by mouth daily.      . hydrochlorothiazide (MICROZIDE) 12.5 MG capsule Take 1 capsule (12.5 mg total) by mouth daily. 30 capsule 11   No current facility-administered medications on file prior to visit.    Review of Systems  Constitutional: Negative for unusual diaphoresis or night sweats HENT: Negative for ear swelling or discharge Eyes:  Negative for worsening visual haziness  Respiratory: Negative for choking and stridor.   Gastrointestinal: Negative for distension or worsening eructation Genitourinary: Negative for retention or change in urine volume.  Musculoskeletal: Negative for other MSK pain or swelling Skin: Negative for color change and worsening wound Neurological: Negative for tremors and numbness other than noted  Psychiatric/Behavioral: Negative for decreased concentration or agitation other than above       Objective:   Physical Exam BP 140/82   Pulse 81   Temp 98.7 F (37.1 C) (Oral)   Resp 20   Wt 165 lb (74.8 kg)   SpO2 95%   BMI 23.68 kg/m  VS noted,  Constitutional: Pt appears in no apparent distress HENT: Head: NCAT.  Right Ear: External ear normal.  Left Ear: External ear normal.  Eyes: . Pupils are equal, round, and reactive to light. Conjunctivae and EOM are normal Neck: Normal range of motion. Neck supple.  Cardiovascular: Normal rate and regular rhythm.   Pulmonary/Chest: Effort normal and breath sounds without rales or wheezing.  Abd:  Soft, NT, ND, + BS Neurological: Pt is alert. Not confused , motor grossly intact Skin: Skin is warm. No rash, no LE edema Psychiatric: Pt behavior is normal. No agitation.     Assessment & Plan:

## 2016-04-04 ENCOUNTER — Encounter: Payer: Self-pay | Admitting: Gastroenterology

## 2016-04-07 NOTE — Assessment & Plan Note (Addendum)
Asympt, for f/u. psa today,  to f/u any worsening symptoms or concerns, refer urology

## 2016-04-07 NOTE — Assessment & Plan Note (Signed)
Spontaneously resolved, declines further evaluation,  to f/u any worsening symptoms or concerns

## 2016-04-07 NOTE — Assessment & Plan Note (Signed)
stable overall by history and exam, recent data reviewed with pt, and pt to continue medical treatment as before,  to f/u any worsening symptoms or concerns BP Readings from Last 3 Encounters:  04/02/16 140/82  10/03/15 (!) 142/84  03/30/15 (!) 158/78

## 2016-04-07 NOTE — Assessment & Plan Note (Signed)
stable overall by history and exam, recent data reviewed with pt, and pt to continue medical treatment as before,  to f/u any worsening symptoms or concerns Lab Results  Component Value Date   HGBA1C 6.1 04/02/2016

## 2016-04-07 NOTE — Assessment & Plan Note (Signed)
stable overall by history and exam, recent data reviewed with pt, and pt to continue medical treatment as before,  to f/u any worsening symptoms or concerns Lab Results  Component Value Date   WBC 6.9 04/02/2016   HGB 12.4 (L) 04/02/2016   HCT 37.2 (L) 04/02/2016   MCV 86.0 04/02/2016   PLT 305.0 04/02/2016

## 2016-04-21 ENCOUNTER — Other Ambulatory Visit: Payer: Self-pay | Admitting: Internal Medicine

## 2016-05-09 DIAGNOSIS — K136 Irritative hyperplasia of oral mucosa: Secondary | ICD-10-CM | POA: Diagnosis not present

## 2016-06-11 ENCOUNTER — Ambulatory Visit: Payer: BLUE CROSS/BLUE SHIELD | Admitting: Gastroenterology

## 2016-06-17 ENCOUNTER — Encounter: Payer: Self-pay | Admitting: Gastroenterology

## 2016-06-19 ENCOUNTER — Encounter: Payer: Self-pay | Admitting: Internal Medicine

## 2016-06-19 ENCOUNTER — Other Ambulatory Visit (INDEPENDENT_AMBULATORY_CARE_PROVIDER_SITE_OTHER): Payer: BLUE CROSS/BLUE SHIELD

## 2016-06-19 DIAGNOSIS — Z0001 Encounter for general adult medical examination with abnormal findings: Secondary | ICD-10-CM | POA: Diagnosis not present

## 2016-06-19 LAB — PSA: PSA: 8.46 ng/mL — ABNORMAL HIGH (ref 0.10–4.00)

## 2016-06-20 DIAGNOSIS — R972 Elevated prostate specific antigen [PSA]: Secondary | ICD-10-CM | POA: Diagnosis not present

## 2016-07-09 DIAGNOSIS — R972 Elevated prostate specific antigen [PSA]: Secondary | ICD-10-CM | POA: Diagnosis not present

## 2016-07-09 DIAGNOSIS — N410 Acute prostatitis: Secondary | ICD-10-CM | POA: Diagnosis not present

## 2016-07-16 DIAGNOSIS — R972 Elevated prostate specific antigen [PSA]: Secondary | ICD-10-CM | POA: Diagnosis not present

## 2016-08-05 ENCOUNTER — Other Ambulatory Visit: Payer: Self-pay

## 2016-08-05 MED ORDER — MELOXICAM 15 MG PO TABS: 15.0000 mg | ORAL_TABLET | Freq: Every day | ORAL | 1 refills | Status: DC | PRN

## 2016-08-05 NOTE — Telephone Encounter (Signed)
Pt rq rf for meloxicam. Please advise.

## 2016-08-05 NOTE — Telephone Encounter (Signed)
Done erx 

## 2016-08-15 ENCOUNTER — Ambulatory Visit: Payer: BLUE CROSS/BLUE SHIELD | Admitting: Gastroenterology

## 2016-09-23 ENCOUNTER — Other Ambulatory Visit (INDEPENDENT_AMBULATORY_CARE_PROVIDER_SITE_OTHER): Payer: BLUE CROSS/BLUE SHIELD

## 2016-09-23 ENCOUNTER — Ambulatory Visit (INDEPENDENT_AMBULATORY_CARE_PROVIDER_SITE_OTHER): Payer: BLUE CROSS/BLUE SHIELD | Admitting: Gastroenterology

## 2016-09-23 ENCOUNTER — Encounter: Payer: Self-pay | Admitting: Gastroenterology

## 2016-09-23 ENCOUNTER — Encounter (INDEPENDENT_AMBULATORY_CARE_PROVIDER_SITE_OTHER): Payer: Self-pay

## 2016-09-23 VITALS — BP 142/70 | HR 88 | Ht 69.0 in | Wt 171.4 lb

## 2016-09-23 DIAGNOSIS — D509 Iron deficiency anemia, unspecified: Secondary | ICD-10-CM

## 2016-09-23 DIAGNOSIS — Z86010 Personal history of colonic polyps: Secondary | ICD-10-CM | POA: Diagnosis not present

## 2016-09-23 LAB — CBC WITH DIFFERENTIAL/PLATELET
BASOS ABS: 0.1 10*3/uL (ref 0.0–0.1)
Basophils Relative: 1.1 % (ref 0.0–3.0)
EOS PCT: 6.6 % — AB (ref 0.0–5.0)
Eosinophils Absolute: 0.5 10*3/uL (ref 0.0–0.7)
HEMATOCRIT: 38.8 % — AB (ref 39.0–52.0)
Hemoglobin: 13.2 g/dL (ref 13.0–17.0)
LYMPHS PCT: 26.7 % (ref 12.0–46.0)
Lymphs Abs: 2 10*3/uL (ref 0.7–4.0)
MCHC: 34 g/dL (ref 30.0–36.0)
MCV: 95.3 fl (ref 78.0–100.0)
MONOS PCT: 8.3 % (ref 3.0–12.0)
Monocytes Absolute: 0.6 10*3/uL (ref 0.1–1.0)
NEUTROS ABS: 4.2 10*3/uL (ref 1.4–7.7)
Neutrophils Relative %: 57.3 % (ref 43.0–77.0)
PLATELETS: 243 10*3/uL (ref 150.0–400.0)
RBC: 4.07 Mil/uL — AB (ref 4.22–5.81)
RDW: 14.9 % (ref 11.5–15.5)
WBC: 7.3 10*3/uL (ref 4.0–10.5)

## 2016-09-23 MED ORDER — NA SULFATE-K SULFATE-MG SULF 17.5-3.13-1.6 GM/177ML PO SOLN: 1.0000 | Freq: Once | ORAL | 0 refills | Status: AC

## 2016-09-23 MED ORDER — OMEPRAZOLE 20 MG PO CPDR: 20.0000 mg | DELAYED_RELEASE_CAPSULE | Freq: Every day | ORAL | 3 refills | Status: DC

## 2016-09-23 NOTE — Patient Instructions (Signed)
If you are age 64 or older, your body mass index should be between 23-30. Your Body mass index is 25.31 kg/m. If this is out of the aforementioned range listed, please consider follow up with your Primary Care Provider.  If you are age 63 or younger, your body mass index should be between 19-25. Your Body mass index is 25.31 kg/m. If this is out of the aformentioned range listed, please consider follow up with your Primary Care Provider.   We have sent the following medications to your pharmacy for you to pick up at your convenience:  [place] have been scheduled for an endoscopy and colonoscopy. Please follow the written instructions given to you at your visit today. Please pick up your prep supplies at the pharmacy within the next 1-3 days. If you use inhalers (even only as needed), please bring them with you on the day of your procedure. Your physician has requested that you go to www.startemmi.com and enter the access code given to you at your visit today. This web site gives a general overview about your procedure. However, you should still follow specific instructions given to you by our office regarding your preparation for the procedure.  Your physician has requested that you go to the basement for the following lab work before leaving today:  CBC  Please begin taking your iron supplement per Dr Havery Moros as instructed  Thank you.

## 2016-09-23 NOTE — Progress Notes (Signed)
HPI :  66 y/o male with a history of HTN, colon adenomas, diverticulosis, here for an evaluation for iron deficiency anemia diagnosed in August 2017. He has had a chronic anemia with Hgb of 11-12s with MCV in the 80s since 2016. On August 1st his iron level was 11 with iron sat of 2%. He has not been seen here since 2011.  He denies any blood in the stools. He denies any bowel problems. Eating well. No nausea or vomiting. No dysphagia. No abdominal pains. No weight loss. He denies any FH of malignancy. He has been prescribed mobic but states he doesn't take it much although was unclear as to why he was given it or taking it. He takes an aspirin every day. No other NSAIDs he endorses. He had been on oral iron but ran out. He has good energy level. He has no complaints otherwise.    Colonoscopy 05/16/10 - 2 left sided colon polyps - adenomas Colonoscopy 03/14/2003 - diverticulosis, small left sided polyps, hyperplastic polyps   Past Medical History:  Diagnosis Date  . Anemia, iron deficiency 04/02/2016  . COLONIC POLYPS, HX OF 11/24/2007  . DIVERTICULOSIS, COLON 11/24/2007  . ED (erectile dysfunction)   . HYPERTENSION 11/24/2007  . Increased prostate specific antigen (PSA) velocity 06/22/2011     Past Surgical History:  Procedure Laterality Date  . NO PAST SURGERIES     Family History  Problem Relation Age of Onset  . Stroke Mother   . Diabetes Father   . Hypertension Sister   . Kidney failure Brother     dialysis  . Diabetes Paternal Grandfather   . Anuerysm Brother    Social History  Substance Use Topics  . Smoking status: Current Every Day Smoker  . Smokeless tobacco: Never Used  . Alcohol use Yes     Comment: occasional beer   Current Outpatient Prescriptions  Medication Sig Dispense Refill  . amLODipine-benazepril (LOTREL) 10-40 MG capsule Take 1 capsule by mouth  daily 90 capsule 1  . aspirin 81 MG tablet Take 81 mg by mouth daily.      . hydrochlorothiazide (MICROZIDE)  12.5 MG capsule Take 1 capsule (12.5 mg total) by mouth daily. 30 capsule 11  . meloxicam (MOBIC) 15 MG tablet Take 1 tablet (15 mg total) by mouth daily as needed for pain. 90 tablet 1   No current facility-administered medications for this visit.    No Known Allergies   Review of Systems: All systems reviewed and negative except where noted in HPI.   CBC Latest Ref Rng & Units 04/02/2016 10/03/2015 03/30/2015  WBC 4.0 - 10.5 K/uL 6.9 6.4 5.5  Hemoglobin 13.0 - 17.0 g/dL 12.4(L) 11.7(L) 11.1(L)  Hematocrit 39.0 - 52.0 % 37.2(L) 36.0(L) 33.9(L)  Platelets 150.0 - 400.0 K/uL 305.0 297.0 318.0    Lab Results  Component Value Date   IRON 11 (L) 04/02/2016     Physical Exam: BP (!) 142/70 (BP Location: Left Arm, Patient Position: Sitting, Cuff Size: Normal)   Pulse 88   Ht 5\' 9"  (1.753 m) Comment: height measured without shoes  Wt 171 lb 6 oz (77.7 kg)   BMI 25.31 kg/m  Constitutional: Pleasant,well-developed, male in no acute distress. HEENT: Normocephalic and atraumatic.  No scleral icterus. Neck supple.  Cardiovascular: Normal rate, regular rhythm.  Pulmonary/chest: Effort normal and breath sounds normal. No wheezing, rales or rhonchi. Abdominal: Soft, nondistended, nontender. . There are no masses palpable. No hepatomegaly. Extremities: no edema Lymphadenopathy: No cervical  adenopathy noted. Neurological: Alert and oriented to person place and time. Skin: Skin is warm and dry. No rashes noted. Psychiatric: Normal mood and affect. Behavior is normal.   ASSESSMENT AND PLAN: 66 year old male here for assessment of the following issues:  Iron deficiency anemia - as outlined above, unclear how much NSAIDs he is taking, told him to stop Motrin and start Prilosec 20 mg once daily. We'll repeat CBC today to see where his hemoglobin is trending since last drawn in August, and told him to resume his iron supplement. Otherwise ultimately recommend EGD and colonoscopy to further  evaluate this issue. I discussed risks and benefits of endoscopy and anesthesia with him and he wished to proceed.   History of adenomas - due for colonoscopy, as above.  Donley Cellar, MD [facility] Pager [phone]  CC: Biagio Borg, MD

## 2016-09-24 ENCOUNTER — Encounter: Payer: Self-pay | Admitting: Gastroenterology

## 2016-09-24 NOTE — Progress Notes (Signed)
Letter mailed

## 2016-10-03 ENCOUNTER — Ambulatory Visit (INDEPENDENT_AMBULATORY_CARE_PROVIDER_SITE_OTHER): Payer: BLUE CROSS/BLUE SHIELD | Admitting: Internal Medicine

## 2016-10-03 ENCOUNTER — Other Ambulatory Visit (INDEPENDENT_AMBULATORY_CARE_PROVIDER_SITE_OTHER): Payer: BLUE CROSS/BLUE SHIELD

## 2016-10-03 ENCOUNTER — Encounter: Payer: Self-pay | Admitting: Internal Medicine

## 2016-10-03 VITALS — BP 140/78 | HR 78 | Temp 98.6°F | Resp 20 | Wt 173.0 lb

## 2016-10-03 DIAGNOSIS — D509 Iron deficiency anemia, unspecified: Secondary | ICD-10-CM

## 2016-10-03 DIAGNOSIS — R7989 Other specified abnormal findings of blood chemistry: Secondary | ICD-10-CM | POA: Diagnosis not present

## 2016-10-03 DIAGNOSIS — R739 Hyperglycemia, unspecified: Secondary | ICD-10-CM | POA: Diagnosis not present

## 2016-10-03 DIAGNOSIS — I10 Essential (primary) hypertension: Secondary | ICD-10-CM | POA: Diagnosis not present

## 2016-10-03 DIAGNOSIS — Z0001 Encounter for general adult medical examination with abnormal findings: Secondary | ICD-10-CM

## 2016-10-03 LAB — CBC WITH DIFFERENTIAL/PLATELET
BASOS ABS: 0.1 10*3/uL (ref 0.0–0.1)
BASOS PCT: 1.3 % (ref 0.0–3.0)
EOS ABS: 0.6 10*3/uL (ref 0.0–0.7)
Eosinophils Relative: 9.1 % — ABNORMAL HIGH (ref 0.0–5.0)
HCT: 38.6 % — ABNORMAL LOW (ref 39.0–52.0)
HEMOGLOBIN: 13.2 g/dL (ref 13.0–17.0)
LYMPHS PCT: 29.1 % (ref 12.0–46.0)
Lymphs Abs: 2 10*3/uL (ref 0.7–4.0)
MCHC: 34.2 g/dL (ref 30.0–36.0)
MCV: 95.9 fl (ref 78.0–100.0)
MONO ABS: 0.5 10*3/uL (ref 0.1–1.0)
Monocytes Relative: 7.8 % (ref 3.0–12.0)
Neutro Abs: 3.7 10*3/uL (ref 1.4–7.7)
Neutrophils Relative %: 52.7 % (ref 43.0–77.0)
PLATELETS: 283 10*3/uL (ref 150.0–400.0)
RBC: 4.02 Mil/uL — ABNORMAL LOW (ref 4.22–5.81)
RDW: 14.4 % (ref 11.5–15.5)
WBC: 7 10*3/uL (ref 4.0–10.5)

## 2016-10-03 LAB — HEMOGLOBIN A1C: HEMOGLOBIN A1C: 6.2 % (ref 4.6–6.5)

## 2016-10-03 LAB — LIPID PANEL
CHOLESTEROL: 154 mg/dL (ref 0–200)
HDL: 36 mg/dL — ABNORMAL LOW (ref >39.00)
NonHDL: 117.63
TRIGLYCERIDES: 205 mg/dL — AB (ref 0.0–149.0)
Total CHOL/HDL Ratio: 4
VLDL: 41 mg/dL — ABNORMAL HIGH (ref 0.0–40.0)

## 2016-10-03 LAB — BASIC METABOLIC PANEL
BUN: 17 mg/dL (ref 6–23)
CHLORIDE: 108 meq/L (ref 96–112)
CO2: 29 meq/L (ref 19–32)
CREATININE: 1.05 mg/dL (ref 0.40–1.50)
Calcium: 9.2 mg/dL (ref 8.4–10.5)
GFR: 91.59 mL/min (ref >60.00)
GLUCOSE: 103 mg/dL — AB (ref 70–99)
POTASSIUM: 4.7 meq/L (ref 3.5–5.1)
Sodium: 140 mEq/L (ref 135–145)

## 2016-10-03 LAB — IBC PANEL
Iron: 81 ug/dL (ref 42–165)
Saturation Ratios: 17.2 % — ABNORMAL LOW (ref 20.0–50.0)
Transferrin: 336 mg/dL (ref 212.0–360.0)

## 2016-10-03 LAB — HEPATIC FUNCTION PANEL
ALBUMIN: 4 g/dL (ref 3.5–5.2)
ALT: 10 U/L (ref 0–53)
Alkaline Phosphatase: 59 U/L (ref 39–117)
BILIRUBIN TOTAL: 0.3 mg/dL (ref 0.2–1.2)
Bilirubin, Direct: 0.1 mg/dL (ref 0.0–0.3)
TOTAL PROTEIN: 6.5 g/dL (ref 6.0–8.3)

## 2016-10-03 LAB — URINALYSIS, ROUTINE W REFLEX MICROSCOPIC
BILIRUBIN URINE: NEGATIVE
Hgb urine dipstick: NEGATIVE
KETONES UR: NEGATIVE
Leukocytes, UA: NEGATIVE
NITRITE: NEGATIVE
PH: 6 (ref 5.0–8.0)
SPECIFIC GRAVITY, URINE: 1.02 (ref 1.000–1.030)
Total Protein, Urine: NEGATIVE
UROBILINOGEN UA: 0.2 (ref 0.0–1.0)
Urine Glucose: NEGATIVE

## 2016-10-03 LAB — LDL CHOLESTEROL, DIRECT: LDL DIRECT: 85 mg/dL

## 2016-10-03 LAB — PSA: PSA: 7.31 ng/mL — ABNORMAL HIGH (ref 0.10–4.00)

## 2016-10-03 LAB — TSH: TSH: 0.93 u[IU]/mL (ref 0.35–4.50)

## 2016-10-03 NOTE — Assessment & Plan Note (Signed)
stable overall by history and exam, recent data reviewed with pt, and pt to continue medical treatment as before,  to f/u any worsening symptoms or concerns BP Readings from Last 3 Encounters:  10/03/16 140/78  09/23/16 (!) 142/70  04/02/16 140/82

## 2016-10-03 NOTE — Patient Instructions (Addendum)
You had the flu shot today  Please continue all other medications as before, and refills have been done if requested.  Please have the pharmacy call with any other refills you may need.  Please continue your efforts at being more active, low cholesterol diet, and weight control.  You are otherwise up to date with prevention measures today.  Please keep your appointments with your specialists as you may have planned  Please go to the LAB in the Basement (turn left off the elevator) for the tests to be done today  You will be contacted by phone if any changes need to be made immediately.  Otherwise, you will receive a letter about your results with an explanation, but please check with MyChart first.  Please remember to sign up for MyChart if you have not done so, as this will be important to you in the future with finding out test results, communicating by private email, and scheduling acute appointments online when needed.  Please return in 6 months, or sooner if needed, for Blood pressure

## 2016-10-03 NOTE — Progress Notes (Signed)
Subjective:    Patient ID: Joshua Curtis, male    DOB: [birth year], 66 y.o.   MRN: [id number]  HPI  Here for wellness and f/u;  Overall doing ok;  Pt denies Chest pain, worsening SOB, DOE, wheezing, orthopnea, PND, worsening LE edema, palpitations, dizziness or syncope.  Pt denies neurological change such as new headache, facial or extremity weakness.  Pt denies polydipsia, polyuria, or low sugar symptoms. Pt states overall good compliance with treatment and medications, good tolerability, and has been trying to follow appropriate diet.  Pt denies worsening depressive symptoms, suicidal ideation or panic. No fever, night sweats, wt loss, loss of appetite, or other constitutional symptoms.  Pt states good ability with ADL's, has low fall risk, home safety reviewed and adequate, no other significant changes in hearing or vision, and occasionally active with exercise.  Has EGD with colonoscopy sched for mar 19 in f/u for low iron/anemia with dr Havery Moros. Denies urinary symptoms such as dysuria, frequency, urgency, flank pain, hematuria or n/v, fever, chills. S/p 2 prostate biopsy neg, but has persistent elev PSA sched for biopsy #3 for may 7 with Dr Karsten Ro.    Past Medical History:  Diagnosis Date  . Anemia, iron deficiency 04/02/2016  . COLONIC POLYPS, HX OF 11/24/2007  . DIVERTICULOSIS, COLON 11/24/2007  . ED (erectile dysfunction)   . HYPERTENSION 11/24/2007  . Increased prostate specific antigen (PSA) velocity 06/22/2011   Past Surgical History:  Procedure Laterality Date  . NO PAST SURGERIES      reports that he has been smoking.  He has never used smokeless tobacco. He reports that he drinks alcohol. He reports that he does not use drugs. family history includes Anuerysm in his brother; Diabetes in his father and paternal grandfather; Hypertension in his sister; Kidney failure in his brother; Stroke in his mother. No Known Allergies Current Outpatient Prescriptions on File Prior to Visit    Medication Sig Dispense Refill  . amLODipine-benazepril (LOTREL) 10-40 MG capsule Take 1 capsule by mouth  daily 90 capsule 1  . aspirin 81 MG tablet Take 81 mg by mouth daily.      . hydrochlorothiazide (MICROZIDE) 12.5 MG capsule Take 1 capsule (12.5 mg total) by mouth daily. 30 capsule 11  . meloxicam (MOBIC) 15 MG tablet Take 1 tablet (15 mg total) by mouth daily as needed for pain. 90 tablet 1  . omeprazole (PRILOSEC) 20 MG capsule Take 1 capsule (20 mg total) by mouth daily. 90 capsule 3   No current facility-administered medications on file prior to visit.    Review of Systems Constitutional: Negative for increased diaphoresis, or other activity, appetite or siginficant weight change other than noted HENT: Negative for worsening hearing loss, ear pain, facial swelling, mouth sores and neck stiffness.   Eyes: Negative for other worsening pain, redness or visual disturbance.  Respiratory: Negative for choking or stridor Cardiovascular: Negative for other chest pain and palpitations.  Gastrointestinal: Negative for worsening diarrhea, blood in stool, or abdominal distention Genitourinary: Negative for hematuria, flank pain or change in urine volume.  Musculoskeletal: Negative for myalgias or other joint complaints.  Skin: Negative for other color change and wound or drainage.  Neurological: Negative for syncope and numbness. other than noted Hematological: Negative for adenopathy. or other swelling Psychiatric/Behavioral: Negative for hallucinations, SI, self-injury, decreased concentration or other worsening agitation.  All other system neg     Objective:   Physical Exam BP 140/78   Pulse 78   Temp 98.6 F (  37 C) (Oral)   Resp 20   Wt 173 lb (78.5 kg)   SpO2 94%   BMI 25.55 kg/m  VS noted,  Constitutional: Pt is oriented to person, place, and time. Appears well-developed and well-nourished, in no significant distress Head: Normocephalic and atraumatic  Eyes: Conjunctivae  and EOM are normal. Pupils are equal, round, and reactive to light Right Ear: External ear normal.  Left Ear: External ear normal Nose: Nose normal.  Mouth/Throat: Oropharynx is clear and moist  Neck: Normal range of motion. Neck supple. No JVD present. No tracheal deviation present or significant neck LA or mass Cardiovascular: Normal rate, regular rhythm, normal heart sounds and intact distal pulses.   Pulmonary/Chest: Effort normal and breath sounds without rales or wheezing  Abdominal: Soft. Bowel sounds are normal. NT. No HSM  Musculoskeletal: Normal range of motion. Exhibits no edema Lymphadenopathy: Has no cervical adenopathy.  Neurological: Pt is alert and oriented to person, place, and time. Pt has normal reflexes. No cranial nerve deficit. Motor grossly intact Skin: Skin is warm and dry. No rash noted or new ulcers Psychiatric:  Has normal mood and affect. Behavior is normal.  No other new exam findings    Assessment & Plan:

## 2016-10-03 NOTE — Assessment & Plan Note (Addendum)
Also for f/u cbc/iron, as well as egd/colonoscopy as planned

## 2016-10-03 NOTE — Progress Notes (Signed)
Pre visit review using our clinic review tool, if applicable. No additional management support is needed unless otherwise documented below in the visit note. 

## 2016-10-03 NOTE — Assessment & Plan Note (Signed)

## 2016-10-03 NOTE — Assessment & Plan Note (Signed)
Minor, for f/u a1c but suspect may be normal

## 2016-10-04 ENCOUNTER — Encounter: Payer: Self-pay | Admitting: Internal Medicine

## 2016-10-28 ENCOUNTER — Other Ambulatory Visit: Payer: Self-pay | Admitting: Internal Medicine

## 2016-11-07 ENCOUNTER — Encounter: Payer: Self-pay | Admitting: Gastroenterology

## 2016-11-18 ENCOUNTER — Ambulatory Visit (AMBULATORY_SURGERY_CENTER): Payer: BLUE CROSS/BLUE SHIELD | Admitting: Gastroenterology

## 2016-11-18 ENCOUNTER — Encounter: Payer: Self-pay | Admitting: Gastroenterology

## 2016-11-18 VITALS — BP 99/64 | HR 87 | Temp 97.5°F | Resp 17 | Ht 69.0 in | Wt 171.0 lb

## 2016-11-18 DIAGNOSIS — D123 Benign neoplasm of transverse colon: Secondary | ICD-10-CM

## 2016-11-18 DIAGNOSIS — D508 Other iron deficiency anemias: Secondary | ICD-10-CM

## 2016-11-18 DIAGNOSIS — D509 Iron deficiency anemia, unspecified: Secondary | ICD-10-CM | POA: Diagnosis not present

## 2016-11-18 DIAGNOSIS — Z860100 Personal history of colon polyps, unspecified: Secondary | ICD-10-CM

## 2016-11-18 DIAGNOSIS — D128 Benign neoplasm of rectum: Secondary | ICD-10-CM

## 2016-11-18 DIAGNOSIS — K3189 Other diseases of stomach and duodenum: Secondary | ICD-10-CM

## 2016-11-18 DIAGNOSIS — K2970 Gastritis, unspecified, without bleeding: Secondary | ICD-10-CM

## 2016-11-18 DIAGNOSIS — D122 Benign neoplasm of ascending colon: Secondary | ICD-10-CM | POA: Diagnosis not present

## 2016-11-18 DIAGNOSIS — D120 Benign neoplasm of cecum: Secondary | ICD-10-CM | POA: Diagnosis not present

## 2016-11-18 MED ORDER — SODIUM CHLORIDE 0.9 % IV SOLN: 500.0000 mL | INTRAVENOUS | Status: DC

## 2016-11-18 NOTE — Op Note (Signed)
Joshua Curtis Patient Name: Joshua Curtis Procedure Date: 11/18/2016 10:31 AM MRN: [id number] Endoscopist: Remo Lipps P. Syan Cullimore MD, MD Age: 66 Referring MD:  Date of Birth: 1951-04-09 Gender: Male Account #: [id number] Procedure:                Upper GI endoscopy Indications:              Iron deficiency anemia Medicines:                Monitored Anesthesia Care Procedure:                Pre-Anesthesia Assessment:                           - Prior to the procedure, a History and Physical                            was performed, and patient medications and                            allergies were reviewed. The patient's tolerance of                            previous anesthesia was also reviewed. The risks                            and benefits of the procedure and the sedation                            options and risks were discussed with the patient.                            All questions were answered, and informed consent                            was obtained. Prior Anticoagulants: The patient has                            taken aspirin, last dose was 1 day prior to                            procedure. ASA Grade Assessment: II - A patient                            with mild systemic disease. After reviewing the                            risks and benefits, the patient was deemed in                            satisfactory condition to undergo the procedure.                           After obtaining informed consent, the endoscope was  passed under direct vision. Throughout the                            procedure, the patient's blood pressure, pulse, and                            oxygen saturations were monitored continuously. The                            Endoscope was introduced through the mouth, and                            advanced to the second part of duodenum. The upper                            GI endoscopy was  accomplished without difficulty.                            The patient tolerated the procedure well. Scope In: Scope Out: Findings:                 Esophagogastric landmarks were identified: the                            Z-line was found at 39 cm, the gastroesophageal                            junction was found at 39 cm and the upper extent of                            the gastric folds was found at 44 cm from the                            incisors.                           A 5 cm hiatal hernia was present.                           The exam of the esophagus was otherwise normal.                           Patchy mild inflammation characterized by erythema                            was found in the gastric antrum without focal                            ulceration. Biopsies were taken with a cold forceps                            for Helicobacter pylori testing.  The exam of the stomach was otherwise normal.                           Multiple medium subepithelial nodules (fluid                            filled?) - 5-24mm in largest diameter - with a                            patchy distribution were found in the duodenal bulb                            and in the second portion of the duodenum. Bite on                            bite biopsies were taken from a few representative                            lesions with a cold forceps for histology.                           The exam of the duodenum was otherwise normal. Complications:            No immediate complications. Estimated blood loss:                            Minimal. Estimated Blood Loss:     Estimated blood loss was minimal. Impression:               - Esophagogastric landmarks identified.                           - 5 cm hiatal hernia.                           - Normal esophagus otherwise                           - Gastritis. Biopsied.                           - Multiple subepithelial  nodules found in the                            duodenum. Suspect these may represent benign cystic                            lesions (appeared fluid filled). Bite on bite                            Biopsies obtained. Recommendation:           - Patient has a contact number available for                            emergencies. The signs and symptoms of  potential                            delayed complications were discussed with the                            patient. Return to normal activities tomorrow.                            Written discharge instructions were provided to the                            patient.                           - Resume previous diet.                           - Continue present medications.                           - Await pathology results. Remo Lipps P. Joshua Curtis Quinney MD, MD 11/18/2016 11:21:15 AM This report has been signed electronically.

## 2016-11-18 NOTE — Patient Instructions (Addendum)
YOU HAD AN ENDOSCOPIC PROCEDURE TODAY AT [facility]:   Refer to the procedure report that was given to you for any specific questions about what was found during the examination.  If the procedure report does not answer your questions, please call your gastroenterologist to clarify.  If you requested that your care partner not be given the details of your procedure findings, then the procedure report has been included in a sealed envelope for you to review at your convenience later.  YOU SHOULD EXPECT: Some feelings of bloating in the abdomen. Passage of more gas than usual.  Walking can help get rid of the air that was put into your GI tract during the procedure and reduce the bloating. If you had a lower endoscopy (such as a colonoscopy or flexible sigmoidoscopy) you may notice spotting of blood in your stool or on the toilet paper. If you underwent a bowel prep for your procedure, you may not have a normal bowel movement for a few days.  Please Note:  You might notice some irritation and congestion in your nose or some drainage.  This is from the oxygen used during your procedure.  There is no need for concern and it should clear up in a day or so.  SYMPTOMS TO REPORT IMMEDIATELY:   Following lower endoscopy (colonoscopy or flexible sigmoidoscopy):  Excessive amounts of blood in the stool  Significant tenderness or worsening of abdominal pains  Swelling of the abdomen that is new, acute  Fever of 100F or higher   Following upper endoscopy (EGD)  Vomiting of blood or coffee ground material  New chest pain or pain under the shoulder blades  Painful or persistently difficult swallowing  New shortness of breath  Fever of 100F or higher  Black, tarry-looking stools  For urgent or emergent issues, a gastroenterologist can be reached at any hour by calling [phone].   DIET:  We do recommend a small meal at first, but then you may proceed to your regular diet.  Drink  plenty of fluids but you should avoid alcoholic beverages for 24 hours.  ACTIVITY:  You should plan to take it easy for the rest of today and you should NOT DRIVE or use heavy machinery until tomorrow (because of the sedation medicines used during the test).    FOLLOW UP: Our staff will call the number listed on your records the next business day following your procedure to check on you and address any questions or concerns that you may have regarding the information given to you following your procedure. If we do not reach you, we will leave a message.  However, if you are feeling well and you are not experiencing any problems, there is no need to return our call.  We will assume that you have returned to your regular daily activities without incident.  If any biopsies were taken you will be contacted by phone or by letter within the next 1-3 weeks.  Please call us at [phone] if you have not heard about the biopsies in 3 weeks.   Await for biopsy results to determined next repeat Colonoscopy No Aspirin, Naproxen, or there non-steriodal anti-inflammatory drugs for two weeks after polyps removal Diverticulosis (handout given) Hemorrhoids (handout given) Polyps (handout given) Hiatal Hernia (handout given) Gastritis (handout given)    SIGNATURES/CONFIDENTIALITY: You and/or your care partner have signed paperwork which will be entered into your electronic medical record.  These signatures attest to the fact that that the  information above on your After Visit Summary has been reviewed and is understood.  Full responsibility of the confidentiality of this discharge information lies with you and/or your care-partner.

## 2016-11-18 NOTE — Progress Notes (Signed)
Pt's states no medical or surgical changes since previsit or office visit. 

## 2016-11-18 NOTE — Progress Notes (Signed)
Report to PACU, RN, vss, BBS= Clear.  

## 2016-11-18 NOTE — Op Note (Signed)
[facility] Patient Name: Joshua Curtis Procedure Date: 11/18/2016 10:30 AM MRN: [id number] Endoscopist: Remo Lipps P. Armbruster MD, MD Age: 66 Referring MD:  Date of Birth: 1951-05-16 Gender: Male Account #: [id number] Procedure:                Colonoscopy Indications:              Iron deficiency anemia, history of colon adenomas Medicines:                Monitored Anesthesia Care Procedure:                Pre-Anesthesia Assessment:                           - Prior to the procedure, a History and Physical                            was performed, and patient medications and                            allergies were reviewed. The patient's tolerance of                            previous anesthesia was also reviewed. The risks                            and benefits of the procedure and the sedation                            options and risks were discussed with the patient.                            All questions were answered, and informed consent                            was obtained. Prior Anticoagulants: The patient has                            taken aspirin, last dose was 1 day prior to                            procedure. ASA Grade Assessment: II - A patient                            with mild systemic disease. After reviewing the                            risks and benefits, the patient was deemed in                            satisfactory condition to undergo the procedure.                           After obtaining informed consent, the colonoscope  was passed under direct vision. Throughout the                            procedure, the patient's blood pressure, pulse, and                            oxygen saturations were monitored continuously. The                            Model CF-HQ190L [id number]) scope was introduced                            through the anus and advanced to the the terminal   ileum, with identification of the appendiceal                            orifice and IC valve. The colonoscopy was performed                            without difficulty. The patient tolerated the                            procedure well. The quality of the bowel                            preparation was good. The terminal ileum, ileocecal                            valve, appendiceal orifice, and rectum were                            photographed. Scope In: 10:46:05 AM Scope Out: 11:06:03 AM Scope Withdrawal Time: 0 hours 15 minutes 14 seconds  Total Procedure Duration: 0 hours 19 minutes 58 seconds  Findings:                 The perianal and digital rectal examinations were                            normal.                           A 4 mm polyp was found in the cecum. The polyp was                            sessile. The polyp was removed with a cold snare.                            Resection and retrieval were complete.                           A 5 mm polyp was found in the ascending colon. The                            polyp was  sessile. The polyp was removed with a                            cold snare. Resection and retrieval were complete.                           A 4 mm polyp was found in the hepatic flexure. The                            polyp was sessile. The polyp was removed with a                            cold snare. Resection and retrieval were complete.                           A 5 mm polyp was found in the transverse colon. The                            polyp was sessile. The polyp was removed with a                            cold snare. Resection and retrieval were complete.                           A 4 mm polyp was found in the rectum. The polyp was                            sessile. The polyp was removed with a cold snare.                            Resection and retrieval were complete.                           Scattered medium-mouthed diverticula  were found in                            the left colon and right colon.                           The terminal ileum appeared normal.                           Internal hemorrhoids were found during retroflexion.                           The exam was otherwise without abnormality. Complications:            No immediate complications. Estimated blood loss:                            Minimal. Estimated Blood Loss:     Estimated blood loss was minimal. Impression:               - One 4 mm polyp  in the cecum, removed with a cold                            snare. Resected and retrieved.                           - One 5 mm polyp in the ascending colon, removed                            with a cold snare. Resected and retrieved.                           - One 4 mm polyp at the hepatic flexure, removed                            with a cold snare. Resected and retrieved.                           - One 5 mm polyp in the transverse colon, removed                            with a cold snare. Resected and retrieved.                           - One 4 mm polyp in the rectum, removed with a cold                            snare. Resected and retrieved.                           - Diverticulosis in the left colon and in the right                            colon.                           - The examined portion of the ileum was normal.                           - Internal hemorrhoids.                           - The examination was otherwise normal.                           Overall, no clear cause for iron deficiency noted                            on this exam. Recommendation:           - Patient has a contact number available for                            emergencies. The signs and symptoms of potential  delayed complications were discussed with the                            patient. Return to normal activities tomorrow.                            Written discharge  instructions were provided to the                            patient.                           - Resume previous diet.                           - Continue present medications.                           - No ibuprofen, naproxen, or other non-steroidal                            anti-inflammatory drugs for 2 weeks after polyp                            removal.                           - Await pathology results.                           - Repeat colonoscopy is recommended for                            surveillance. The colonoscopy date will be                            determined after pathology results from today's                            exam become available for review. Remo Lipps P. Armbruster MD, MD 11/18/2016 11:11:57 AM This report has been signed electronically.

## 2016-11-18 NOTE — Progress Notes (Signed)
Called to room to assist during endoscopic procedure.  Patient ID and intended procedure confirmed with present staff. Received instructions for my participation in the procedure from the performing physician.  

## 2016-11-19 ENCOUNTER — Telehealth: Payer: Self-pay | Admitting: *Deleted

## 2016-11-19 NOTE — Telephone Encounter (Signed)
  Follow up Call-  Call back number 11/18/2016  Post procedure Call Back phone  # [phone]  Permission to leave phone message Yes  Some recent data might be hidden     Patient questions:  Do you have a fever, pain , or abdominal swelling? No. Pain Score  0 *  Have you tolerated food without any problems? Yes.    Have you been able to return to your normal activities? Yes.    Do you have any questions about your discharge instructions: Diet   No. Medications  No. Follow up visit  No.  Do you have questions or concerns about your Care? No.  Actions: * If pain score is 4 or above: No action needed, pain <4.

## 2016-11-26 ENCOUNTER — Other Ambulatory Visit: Payer: Self-pay

## 2016-11-26 ENCOUNTER — Telehealth: Payer: Self-pay | Admitting: Gastroenterology

## 2016-11-26 DIAGNOSIS — K3189 Other diseases of stomach and duodenum: Secondary | ICD-10-CM

## 2016-11-26 NOTE — Telephone Encounter (Signed)
I have tried it 3 different times to that same number. Called patient to let him know. Asked him to call them to see if they have a different number I can fax this to.

## 2016-11-26 NOTE — Telephone Encounter (Signed)
Spoke to patient, gave him results from endoscopies and Dr. Doyne Keel recommendations. Patient given information date/time of CT scan at [facility]. Told him to expect information in the mail. Letter composed and faxed to his employer regarding procedure date and needing to be out of work.

## 2016-11-28 ENCOUNTER — Telehealth: Payer: Self-pay

## 2016-11-28 ENCOUNTER — Other Ambulatory Visit: Payer: Self-pay

## 2016-11-28 DIAGNOSIS — K5730 Diverticulosis of large intestine without perforation or abscess without bleeding: Secondary | ICD-10-CM

## 2016-11-28 NOTE — Telephone Encounter (Signed)
Patient aware to get lab work on 4/2, CT rescheduled to 4/5 at 12:00, arrive at [facility] 10:00. NPO 4 hours prior.

## 2016-11-28 NOTE — Telephone Encounter (Signed)
Joshua Curtis, Will you please reschedule this pt's CT. It is scheduled for Monday. Vonda in the lab faxed form to our office stating that pt needed a BUN and creatinine drawn before his scan.  I put in the orders for the labs and pt states that he is on his way to work and cannot come today. The lab I assume is closed tomorrow. He will come on Monday to have blood work. Thank you.

## 2016-12-02 ENCOUNTER — Ambulatory Visit (HOSPITAL_COMMUNITY): Admission: RE | Admit: 2016-12-02 | Payer: BLUE CROSS/BLUE SHIELD | Source: Ambulatory Visit

## 2016-12-02 ENCOUNTER — Other Ambulatory Visit (INDEPENDENT_AMBULATORY_CARE_PROVIDER_SITE_OTHER): Payer: BLUE CROSS/BLUE SHIELD

## 2016-12-02 DIAGNOSIS — K5730 Diverticulosis of large intestine without perforation or abscess without bleeding: Secondary | ICD-10-CM | POA: Diagnosis not present

## 2016-12-02 LAB — BUN: BUN: 17 mg/dL (ref 6–23)

## 2016-12-02 LAB — CREATININE, SERUM: CREATININE: 1.15 mg/dL (ref 0.40–1.50)

## 2016-12-05 ENCOUNTER — Ambulatory Visit (HOSPITAL_COMMUNITY)
Admission: RE | Admit: 2016-12-05 | Discharge: 2016-12-05 | Disposition: A | Discharge status: Home / Self Care | Payer: BLUE CROSS/BLUE SHIELD | Source: Ambulatory Visit | Attending: Gastroenterology | Admitting: Gastroenterology

## 2016-12-05 DIAGNOSIS — N400 Enlarged prostate without lower urinary tract symptoms: Secondary | ICD-10-CM | POA: Insufficient documentation

## 2016-12-05 DIAGNOSIS — M13852 Other specified arthritis, left hip: Secondary | ICD-10-CM | POA: Insufficient documentation

## 2016-12-05 DIAGNOSIS — I708 Atherosclerosis of other arteries: Secondary | ICD-10-CM | POA: Diagnosis not present

## 2016-12-05 DIAGNOSIS — M47816 Spondylosis without myelopathy or radiculopathy, lumbar region: Secondary | ICD-10-CM | POA: Diagnosis not present

## 2016-12-05 DIAGNOSIS — I700 Atherosclerosis of aorta: Secondary | ICD-10-CM | POA: Diagnosis not present

## 2016-12-05 DIAGNOSIS — K3189 Other diseases of stomach and duodenum: Secondary | ICD-10-CM

## 2016-12-05 DIAGNOSIS — M5136 Other intervertebral disc degeneration, lumbar region: Secondary | ICD-10-CM | POA: Insufficient documentation

## 2016-12-05 DIAGNOSIS — R911 Solitary pulmonary nodule: Secondary | ICD-10-CM | POA: Diagnosis not present

## 2016-12-05 DIAGNOSIS — I2510 Atherosclerotic heart disease of native coronary artery without angina pectoris: Secondary | ICD-10-CM | POA: Diagnosis not present

## 2016-12-05 DIAGNOSIS — M13851 Other specified arthritis, right hip: Secondary | ICD-10-CM | POA: Insufficient documentation

## 2016-12-05 DIAGNOSIS — K5730 Diverticulosis of large intestine without perforation or abscess without bleeding: Secondary | ICD-10-CM | POA: Insufficient documentation

## 2016-12-05 DIAGNOSIS — K5641 Fecal impaction: Secondary | ICD-10-CM | POA: Diagnosis not present

## 2016-12-05 MED ORDER — IOPAMIDOL (ISOVUE-300) INJECTION 61%
INTRAVENOUS | Status: AC
  Filled 2016-12-05: qty 100

## 2016-12-05 MED ORDER — IOPAMIDOL (ISOVUE-300) INJECTION 61%
100.0000 mL | Freq: Once | INTRAVENOUS | Status: AC | PRN
  Administered 2016-12-05: 100 mL via INTRAVENOUS

## 2016-12-05 MED ORDER — BARIUM SULFATE 0.1 % PO SUSP
ORAL | Status: AC
  Filled 2016-12-05: qty 3

## 2016-12-09 ENCOUNTER — Other Ambulatory Visit: Payer: Self-pay | Admitting: *Deleted

## 2016-12-09 MED ORDER — HYDROCHLOROTHIAZIDE 12.5 MG PO CAPS: 12.5000 mg | ORAL_CAPSULE | Freq: Every day | ORAL | 3 refills | Status: DC

## 2016-12-30 DIAGNOSIS — R972 Elevated prostate specific antigen [PSA]: Secondary | ICD-10-CM | POA: Diagnosis not present

## 2017-01-06 DIAGNOSIS — R972 Elevated prostate specific antigen [PSA]: Secondary | ICD-10-CM | POA: Diagnosis not present

## 2017-01-06 DIAGNOSIS — N400 Enlarged prostate without lower urinary tract symptoms: Secondary | ICD-10-CM | POA: Diagnosis not present

## 2017-01-08 ENCOUNTER — Encounter: Payer: Self-pay | Admitting: Internal Medicine

## 2017-01-08 ENCOUNTER — Ambulatory Visit (INDEPENDENT_AMBULATORY_CARE_PROVIDER_SITE_OTHER): Payer: BLUE CROSS/BLUE SHIELD | Admitting: Internal Medicine

## 2017-01-08 VITALS — BP 146/70 | HR 77 | Ht 70.0 in | Wt 166.0 lb

## 2017-01-08 DIAGNOSIS — R739 Hyperglycemia, unspecified: Secondary | ICD-10-CM

## 2017-01-08 DIAGNOSIS — I10 Essential (primary) hypertension: Secondary | ICD-10-CM | POA: Diagnosis not present

## 2017-01-08 DIAGNOSIS — F17200 Nicotine dependence, unspecified, uncomplicated: Secondary | ICD-10-CM | POA: Diagnosis not present

## 2017-01-08 DIAGNOSIS — R935 Abnormal findings on diagnostic imaging of other abdominal regions, including retroperitoneum: Secondary | ICD-10-CM

## 2017-01-08 NOTE — Progress Notes (Signed)
Subjective:    Patient ID: Joshua Curtis, male    DOB: [birth year], 65 y.o.   MRN: [id number]  HPI  Here to f/u recent abnormalities assoc with CT scan per GI -  Pt denies chest pain, increased sob or doe, wheezing, orthopnea, PND, increased LE swelling, palpitations, dizziness or syncope.  Pt denies new neurological symptoms such as new headache, or facial or extremity weakness or numbness.  Pt denies polydipsia, polyuria,  BP at h9ome has been < 140/90, but not checked recently   Wants help to quit smoking. BP Readings from Last 3 Encounters:  01/08/17 (!) 146/70  11/18/16 99/64  10/03/16 140/78  Has f/u appt with urolog Dr Karsten Ro   Past Medical History:  Diagnosis Date  . Anemia, iron deficiency 04/02/2016  . COLONIC POLYPS, HX OF 11/24/2007  . DIVERTICULOSIS, COLON 11/24/2007  . ED (erectile dysfunction)   . HYPERTENSION 11/24/2007  . Increased prostate specific antigen (PSA) velocity 06/22/2011   Past Surgical History:  Procedure Laterality Date  . NO PAST SURGERIES      reports that he has been smoking.  He has never used smokeless tobacco. He reports that he drinks alcohol. He reports that he does not use drugs. family history includes Anuerysm in his brother; Diabetes in his father and paternal grandfather; Hypertension in his sister; Kidney failure in his brother; Stroke in his mother. No Known Allergies Current Outpatient Prescriptions on File Prior to Visit  Medication Sig Dispense Refill  . amLODipine-benazepril (LOTREL) 10-40 MG capsule TAKE 1 CAPSULE BY MOUTH  DAILY 90 capsule 3  . aspirin 81 MG tablet Take 81 mg by mouth daily.      . hydrochlorothiazide (MICROZIDE) 12.5 MG capsule Take 1 capsule (12.5 mg total) by mouth daily. 90 capsule 3  . meloxicam (MOBIC) 15 MG tablet Take 1 tablet (15 mg total) by mouth daily as needed for pain. 90 tablet 1  . omeprazole (PRILOSEC) 20 MG capsule Take 1 capsule (20 mg total) by mouth daily. 90 capsule 3   No current  facility-administered medications on file prior to visit.    Review of Systems  Constitutional: Negative for other unusual diaphoresis or sweats HENT: Negative for ear discharge or swelling Eyes: Negative for other worsening visual disturbances Respiratory: Negative for stridor or other swelling  Gastrointestinal: Negative for worsening distension or other blood Genitourinary: Negative for retention or other urinary change Musculoskeletal: Negative for other MSK pain or swelling Skin: Negative for color change or other new lesions Neurological: Negative for worsening tremors and other numbness  Psychiatric/Behavioral: Negative for worsening agitation or other fatigue All other system neg per pt    Objective:   Physical Exam BP (!) 146/70   Pulse 77   Ht 5\' 10"  (1.778 m)   Wt 166 lb (75.3 kg)   SpO2 97%   BMI 23.82 kg/m  VS noted,  Constitutional: Pt appears in NAD HENT: Head: NCAT.  Right Ear: External ear normal.  Left Ear: External ear normal.  Eyes: . Pupils are equal, round, and reactive to light. Conjunctivae and EOM are normal Nose: without d/c or deformity Neck: Neck supple. Gross normal ROM Cardiovascular: Normal rate and regular rhythm.   Pulmonary/Chest: Effort normal and breath sounds without rales or wheezing.  Abd:  Soft, NT, ND, + BS, no organomegaly Neurological: Pt is alert. At baseline orientation, motor grossly intact Skin: Skin is warm. No rashes, other new lesions, no LE edema Psychiatric: Pt behavior is normal without agitation  No other exam findings  12/05/2016 CT ABDOMEN AND PELVIS WITH CONTRAST (ENTEROGRAPHY) Summary - IMPRESSION: 1. There is some mild gastric antral wall thickening and antritis is not excluded. 2. I do not see any polyps of the duodenum or small bowel, with the understanding that negative contrast agent is mostly absent from the duodenum, with the duodenum collapsed. This could obscure a polyp at the polyp enhance in a fashion  similar to the duodenal wall. 3. Prominent stool throughout the colon favors constipation. **An incidental finding of potential clinical significance has been found. 6 mm right lower lobe pulmonary nodule. Non-contrast chest CT at 6-12 months is recommended. If the nodule is stable at time of repeat CT, then future CT at 18-24 months (from today's scan) is considered optional for low-risk patients, but is recommended for high-risk patients. This recommendation follows the consensus statement: Guidelines for Management of Incidental Pulmonary Nodules Detected on CT Images: From the Fleischner Society 2017; Radiology 2017; 284:228-243.** 4. Coronary, descending thoracic aortic, and aortoiliac atherosclerotic calcification. 5. Lumbar spondylosis and degenerative disc disease potentially with mild foraminal impingement at L3-4 and L4-5. Mild to moderate degenerative arthropathy of both hips. 6. Enlarged prostate gland with prominent median lobe, volume estimated at 79 cubic cm. 7. Mild sigmoid diverticulosis.     Assessment & Plan:

## 2017-01-08 NOTE — Patient Instructions (Addendum)
Please continue to monitor your Blood Pressure at home, with the goal of < 140/90  Please stop smoking; you are given the Chantix prescriptions today in case you want to do this  Please continue all other medications as before, and refills have been done if requested.  Please have the pharmacy call with any other refills you may need.  Please continue your efforts at being more active, low cholesterol diet, and weight control.  Please keep your appointments with your specialists as you may have planned  Please return in 3 months, or sooner if needed  OK to cancel the appt in June 2018 (please see the checkout desk as you leave today)

## 2017-01-12 DIAGNOSIS — R935 Abnormal findings on diagnostic imaging of other abdominal regions, including retroperitoneum: Secondary | ICD-10-CM | POA: Insufficient documentation

## 2017-01-12 DIAGNOSIS — Z87891 Personal history of nicotine dependence: Secondary | ICD-10-CM | POA: Insufficient documentation

## 2017-01-12 DIAGNOSIS — F17200 Nicotine dependence, unspecified, uncomplicated: Secondary | ICD-10-CM | POA: Insufficient documentation

## 2017-01-12 NOTE — Assessment & Plan Note (Signed)
stable overall by history and exam, recent data reviewed with pt, and pt to continue medical treatment as before,  to f/u any worsening symptoms or concerns Lab Results  Component Value Date   HGBA1C 6.2 10/03/2016

## 2017-01-12 NOTE — Assessment & Plan Note (Signed)
D/w pt, has been addressed with respect to GI findings and no polyps seen.  Needs constipation addressed with metamucil daily, as well as modification of cardiac risk factors. D/w pt abnormal lumbar spine findings but no acute symptoms.  Denies worsening prostatism or s/s diverticulitis.

## 2017-01-12 NOTE — Assessment & Plan Note (Signed)
Ok for chantix as he is now ready to quit

## 2017-01-12 NOTE — Assessment & Plan Note (Signed)
BP has historically < 140/90 at home , to recheck by restarting daily checks in the next 2 wks, o/w stable overall by history and exam, recent data reviewed with pt, and pt to continue medical treatment as before,  to f/u any worsening symptoms or concerns BP Readings from Last 3 Encounters:  01/08/17 (!) 146/70  11/18/16 99/64  10/03/16 140/78

## 2017-02-04 ENCOUNTER — Ambulatory Visit: Payer: BLUE CROSS/BLUE SHIELD | Admitting: Internal Medicine

## 2017-04-10 ENCOUNTER — Encounter: Payer: Self-pay | Admitting: Internal Medicine

## 2017-04-10 ENCOUNTER — Ambulatory Visit (INDEPENDENT_AMBULATORY_CARE_PROVIDER_SITE_OTHER): Payer: BLUE CROSS/BLUE SHIELD | Admitting: Internal Medicine

## 2017-04-10 VITALS — BP 148/82 | HR 83 | Ht 70.0 in | Wt 163.0 lb

## 2017-04-10 DIAGNOSIS — R911 Solitary pulmonary nodule: Secondary | ICD-10-CM | POA: Diagnosis not present

## 2017-04-10 DIAGNOSIS — F17200 Nicotine dependence, unspecified, uncomplicated: Secondary | ICD-10-CM | POA: Diagnosis not present

## 2017-04-10 DIAGNOSIS — I10 Essential (primary) hypertension: Secondary | ICD-10-CM

## 2017-04-10 DIAGNOSIS — R739 Hyperglycemia, unspecified: Secondary | ICD-10-CM

## 2017-04-10 DIAGNOSIS — Z0000 Encounter for general adult medical examination without abnormal findings: Secondary | ICD-10-CM

## 2017-04-10 DIAGNOSIS — R972 Elevated prostate specific antigen [PSA]: Secondary | ICD-10-CM

## 2017-04-10 DIAGNOSIS — D509 Iron deficiency anemia, unspecified: Secondary | ICD-10-CM

## 2017-04-10 DIAGNOSIS — N400 Enlarged prostate without lower urinary tract symptoms: Secondary | ICD-10-CM

## 2017-04-10 HISTORY — DX: Benign prostatic hyperplasia without lower urinary tract symptoms: N40.0

## 2017-04-10 LAB — POCT GLYCOSYLATED HEMOGLOBIN (HGB A1C): Hemoglobin A1C: 5.5

## 2017-04-10 MED ORDER — VARENICLINE TARTRATE 0.5 MG X 11 & 1 MG X 42 PO MISC: ORAL | 0 refills | Status: DC

## 2017-04-10 MED ORDER — VARENICLINE TARTRATE 1 MG PO TABS: 1.0000 mg | ORAL_TABLET | Freq: Two times a day (BID) | ORAL | 1 refills | Status: DC

## 2017-04-10 NOTE — Progress Notes (Addendum)
Subjective:    Patient ID: Joshua Curtis, male    DOB: [birth year], 66 y.o.   MRN: [id number]  HPI  Here to f/u, had recent anemia iron deficiency improved after tx and neg eval so far with Dr Armbruster/GI , who recommends possible f/u EGD about Oct 2018 .  CT abd with incidental findings RLL pulm nodule, with recommended f/u at 6-12 mo (CT no CM).    Has also persistent elevated PSA, Denies urinary symptoms such as dysuria, frequency, urgency, flank pain, hematuria or n/v, fever, chills.  Has appt with Dr Karsten Ro with labs and prostate f/u.    Pt denies chest pain, increased sob or doe, wheezing, orthopnea, PND, increased LE swelling, palpitations, dizziness or syncope. Pt denies fever, wt loss, night sweats, loss of appetite, or other constitutional symptoms  Still smoking, not sure if he is ready to quit Past Medical History:  Diagnosis Date  . Anemia, iron deficiency 04/02/2016  . BPH (benign prostatic hyperplasia) 04/10/2017  . COLONIC POLYPS, HX OF 11/24/2007  . DIVERTICULOSIS, COLON 11/24/2007  . ED (erectile dysfunction)   . HYPERTENSION 11/24/2007  . Increased prostate specific antigen (PSA) velocity 06/22/2011   Past Surgical History:  Procedure Laterality Date  . NO PAST SURGERIES      reports that he has been smoking.  He has never used smokeless tobacco. He reports that he drinks alcohol. He reports that he does not use drugs. family history includes Anuerysm in his brother; Diabetes in his father and paternal grandfather; Hypertension in his sister; Kidney failure in his brother; Stroke in his mother. No Known Allergies Current Outpatient Prescriptions on File Prior to Visit  Medication Sig Dispense Refill  . amLODipine-benazepril (LOTREL) 10-40 MG capsule TAKE 1 CAPSULE BY MOUTH  DAILY 90 capsule 3  . aspirin 81 MG tablet Take 81 mg by mouth daily.      . hydrochlorothiazide (MICROZIDE) 12.5 MG capsule Take 1 capsule (12.5 mg total) by mouth daily. 90 capsule 3  . meloxicam  (MOBIC) 15 MG tablet Take 1 tablet (15 mg total) by mouth daily as needed for pain. 90 tablet 1  . omeprazole (PRILOSEC) 20 MG capsule Take 1 capsule (20 mg total) by mouth daily. 90 capsule 3   No current facility-administered medications on file prior to visit.    Review of Systems  Constitutional: Negative for other unusual diaphoresis or sweats HENT: Negative for ear discharge or swelling Eyes: Negative for other worsening visual disturbances Respiratory: Negative for stridor or other swelling  Gastrointestinal: Negative for worsening distension or other blood Genitourinary: Negative for retention or other urinary change Musculoskeletal: Negative for other MSK pain or swelling Skin: Negative for color change or other new lesions Neurological: Negative for worsening tremors and other numbness  Psychiatric/Behavioral: Negative for worsening agitation or other fatigue All other system neg per pt    Objective:   Physical Exam BP (!) 148/82   Pulse 83   Ht 5\' 10"  (1.778 m)   Wt 163 lb (73.9 kg)   SpO2 99%   BMI 23.39 kg/m  VS noted,  Constitutional: Pt appears in NAD HENT: Head: NCAT.  Right Ear: External ear normal.  Left Ear: External ear normal.  Eyes: . Pupils are equal, round, and reactive to light. Conjunctivae and EOM are normal Nose: without d/c or deformity Neck: Neck supple. Gross normal ROM Cardiovascular: Normal rate and regular rhythm.   Pulmonary/Chest: Effort normal and breath sounds without rales or wheezing.  Abd:  Soft, NT, ND, + BS, no organomegaly Neurological: Pt is alert. At baseline orientation, motor grossly intact Skin: Skin is warm. No rashes, other new lesions, no LE edema Psychiatric: Pt behavior is normal without agitation  No other exam findings Lab Results  Component Value Date   WBC 7.0 10/03/2016   HGB 13.2 10/03/2016   HCT 38.6 (L) 10/03/2016   PLT 283.0 10/03/2016   GLUCOSE 103 (H) 10/03/2016   CHOL 154 10/03/2016   TRIG 205.0 (H)  10/03/2016   HDL 36.00 (L) 10/03/2016   LDLDIRECT 85.0 10/03/2016   LDLCALC 63 10/03/2015   ALT 10 10/03/2016   AST <3 10/03/2016   NA 140 10/03/2016   K 4.7 10/03/2016   CL 108 10/03/2016   CREATININE 1.15 12/02/2016   BUN 17 12/02/2016   CO2 29 10/03/2016   TSH 0.93 10/03/2016   PSA 7.31 (H) 10/03/2016   HGBA1C 6.2 10/03/2016   POCT glycosylated hemoglobin (Hb A1C)  Order: [id number]  Status:  Final result Visible to patient:  No (Not Released) Dx:  Hyperglycemia  Component 09:48  Hemoglobin A1C 5.5            Assessment & Plan:

## 2017-04-10 NOTE — Assessment & Plan Note (Signed)
Improved, for f/u lab early October 2018 to continue to reassess, may need to consider repeat EGD per Dr Armbruster/GI if abnormal

## 2017-04-10 NOTE — Assessment & Plan Note (Signed)
Minor, asympt, a1c normal today, cont to folllow

## 2017-04-10 NOTE — Assessment & Plan Note (Signed)
Ok for [employer] to use when he is ready

## 2017-04-10 NOTE — Patient Instructions (Addendum)
Your A1c was OK today  Please take all new medication as prescribed - the chantix for quit smoking when you are ready  Please continue all other medications as before, and refills have been done if requested.  Please have the pharmacy call with any other refills you may need.  Please continue your efforts at being more active, low cholesterol diet, and weight control.  Please keep your appointments with your specialists as you may have planned - Dr Karsten Ro in November  You will be contacted regarding the referral for: CT chest (no contrast) for October 2018  Please go to the LAB in the Basement (turn left off the elevator) for the tests to be done about October 1 (remember no one will call you about this as a reminder, you would just go to the lab)  You will be contacted by phone if any changes need to be made immediately.  Otherwise, you will receive a letter about your results with an explanation, but please check with MyChart first.  Please remember to sign up for MyChart if you have not done so, as this will be important to you in the future with finding out test results, communicating by private email, and scheduling acute appointments online when needed.  Please return in 6 months, or sooner if needed, with Lab testing done 3-5 days before   .

## 2017-04-10 NOTE — Assessment & Plan Note (Signed)
Has f/u with urology planned nov 2018 with PSA

## 2017-04-25 ENCOUNTER — Encounter: Payer: Self-pay | Admitting: Gastroenterology

## 2017-06-19 ENCOUNTER — Encounter: Payer: Self-pay | Admitting: Internal Medicine

## 2017-06-27 ENCOUNTER — Telehealth: Payer: Self-pay | Admitting: Internal Medicine

## 2017-06-27 NOTE — Telephone Encounter (Signed)
Tried to contact patient to notify that he needs to call GI for CT.

## 2017-06-30 NOTE — Telephone Encounter (Signed)
Patient notified

## 2017-07-08 DIAGNOSIS — R972 Elevated prostate specific antigen [PSA]: Secondary | ICD-10-CM | POA: Diagnosis not present

## 2017-07-15 DIAGNOSIS — R972 Elevated prostate specific antigen [PSA]: Secondary | ICD-10-CM | POA: Diagnosis not present

## 2017-07-15 DIAGNOSIS — N400 Enlarged prostate without lower urinary tract symptoms: Secondary | ICD-10-CM | POA: Diagnosis not present

## 2017-07-19 ENCOUNTER — Other Ambulatory Visit: Payer: Self-pay | Admitting: Gastroenterology

## 2017-07-21 NOTE — Telephone Encounter (Signed)
Called pt at home and on cell.  Not able to leave a message either place.  Called the work number and it was busy twice.  Pt is in need of an office visit with Dr. Havery Moros and was sent a letter as such in August 2018.

## 2017-10-14 ENCOUNTER — Encounter: Payer: Self-pay | Admitting: Internal Medicine

## 2017-10-14 ENCOUNTER — Telehealth: Payer: Self-pay

## 2017-10-14 ENCOUNTER — Other Ambulatory Visit: Payer: Self-pay | Admitting: Internal Medicine

## 2017-10-14 ENCOUNTER — Ambulatory Visit (INDEPENDENT_AMBULATORY_CARE_PROVIDER_SITE_OTHER): Payer: BLUE CROSS/BLUE SHIELD | Admitting: Internal Medicine

## 2017-10-14 ENCOUNTER — Other Ambulatory Visit (INDEPENDENT_AMBULATORY_CARE_PROVIDER_SITE_OTHER): Payer: BLUE CROSS/BLUE SHIELD

## 2017-10-14 VITALS — BP 142/84 | HR 83 | Temp 98.1°F | Ht 70.0 in | Wt 169.0 lb

## 2017-10-14 DIAGNOSIS — R0602 Shortness of breath: Secondary | ICD-10-CM | POA: Diagnosis not present

## 2017-10-14 DIAGNOSIS — R739 Hyperglycemia, unspecified: Secondary | ICD-10-CM | POA: Diagnosis not present

## 2017-10-14 DIAGNOSIS — Z0000 Encounter for general adult medical examination without abnormal findings: Secondary | ICD-10-CM

## 2017-10-14 DIAGNOSIS — I10 Essential (primary) hypertension: Secondary | ICD-10-CM

## 2017-10-14 DIAGNOSIS — F17200 Nicotine dependence, unspecified, uncomplicated: Secondary | ICD-10-CM

## 2017-10-14 DIAGNOSIS — R911 Solitary pulmonary nodule: Secondary | ICD-10-CM | POA: Diagnosis not present

## 2017-10-14 DIAGNOSIS — R972 Elevated prostate specific antigen [PSA]: Secondary | ICD-10-CM

## 2017-10-14 DIAGNOSIS — D509 Iron deficiency anemia, unspecified: Secondary | ICD-10-CM

## 2017-10-14 DIAGNOSIS — K635 Polyp of colon: Secondary | ICD-10-CM

## 2017-10-14 DIAGNOSIS — Z114 Encounter for screening for human immunodeficiency virus [HIV]: Secondary | ICD-10-CM

## 2017-10-14 LAB — URINALYSIS, ROUTINE W REFLEX MICROSCOPIC
BILIRUBIN URINE: NEGATIVE
HGB URINE DIPSTICK: NEGATIVE
Ketones, ur: NEGATIVE
LEUKOCYTES UA: NEGATIVE
Nitrite: NEGATIVE
PH: 6 (ref 5.0–8.0)
Specific Gravity, Urine: 1.025 (ref 1.000–1.030)
TOTAL PROTEIN, URINE-UPE24: NEGATIVE
Urine Glucose: NEGATIVE
Urobilinogen, UA: 0.2 (ref 0.0–1.0)

## 2017-10-14 LAB — BASIC METABOLIC PANEL
BUN: 13 mg/dL (ref 6–23)
CALCIUM: 9 mg/dL (ref 8.4–10.5)
CO2: 29 mEq/L (ref 19–32)
CREATININE: 1.03 mg/dL (ref 0.40–1.50)
Chloride: 105 mEq/L (ref 96–112)
GFR: 93.34 mL/min (ref >60.00)
GLUCOSE: 93 mg/dL (ref 70–99)
Potassium: 3.8 mEq/L (ref 3.5–5.1)
Sodium: 141 mEq/L (ref 135–145)

## 2017-10-14 LAB — CBC WITH DIFFERENTIAL/PLATELET
BASOS ABS: 0.2 10*3/uL — AB (ref 0.0–0.1)
Basophils Relative: 2.9 % (ref 0.0–3.0)
EOS ABS: 0.5 10*3/uL (ref 0.0–0.7)
Eosinophils Relative: 8.6 % — ABNORMAL HIGH (ref 0.0–5.0)
HEMATOCRIT: 32.8 % — AB (ref 39.0–52.0)
HEMOGLOBIN: 10.3 g/dL — AB (ref 13.0–17.0)
LYMPHS PCT: 23 % (ref 12.0–46.0)
Lymphs Abs: 1.4 10*3/uL (ref 0.7–4.0)
MCHC: 31.3 g/dL (ref 30.0–36.0)
MCV: 73.8 fl — ABNORMAL LOW (ref 78.0–100.0)
MONOS PCT: 8.4 % (ref 3.0–12.0)
Monocytes Absolute: 0.5 10*3/uL (ref 0.1–1.0)
NEUTROS ABS: 3.5 10*3/uL (ref 1.4–7.7)
Neutrophils Relative %: 57.1 % (ref 43.0–77.0)
PLATELETS: 358 10*3/uL (ref 150.0–400.0)
RBC: 4.44 Mil/uL (ref 4.22–5.81)
RDW: 22.6 % — ABNORMAL HIGH (ref 11.5–15.5)
WBC: 6.1 10*3/uL (ref 4.0–10.5)

## 2017-10-14 LAB — TSH: TSH: 1.4 u[IU]/mL (ref 0.35–4.50)

## 2017-10-14 LAB — IBC PANEL
Iron: 24 ug/dL — ABNORMAL LOW (ref 42–165)
SATURATION RATIOS: 4.1 % — AB (ref 20.0–50.0)
Transferrin: 418 mg/dL — ABNORMAL HIGH (ref 212.0–360.0)

## 2017-10-14 LAB — HEPATIC FUNCTION PANEL
ALBUMIN: 4 g/dL (ref 3.5–5.2)
ALK PHOS: 55 U/L (ref 39–117)
ALT: 9 U/L (ref 0–53)
AST: 16 U/L (ref 0–37)
BILIRUBIN DIRECT: 0.1 mg/dL (ref 0.0–0.3)
TOTAL PROTEIN: 7 g/dL (ref 6.0–8.3)
Total Bilirubin: 0.3 mg/dL (ref 0.2–1.2)

## 2017-10-14 LAB — LIPID PANEL
CHOL/HDL RATIO: 3
Cholesterol: 111 mg/dL (ref 0–200)
HDL: 34.4 mg/dL — ABNORMAL LOW (ref >39.00)
LDL Cholesterol: 65 mg/dL (ref 0–99)
NONHDL: 77
TRIGLYCERIDES: 59 mg/dL (ref 0.0–149.0)
VLDL: 11.8 mg/dL (ref 0.0–40.0)

## 2017-10-14 LAB — FERRITIN: Ferritin: 5.4 ng/mL — ABNORMAL LOW (ref 22.0–322.0)

## 2017-10-14 LAB — HEMOGLOBIN A1C: HEMOGLOBIN A1C: 6.2 % (ref 4.6–6.5)

## 2017-10-14 MED ORDER — POLYSACCHARIDE IRON COMPLEX 150 MG PO CAPS: 150.0000 mg | ORAL_CAPSULE | Freq: Every day | ORAL | 1 refills | Status: DC

## 2017-10-14 NOTE — Assessment & Plan Note (Signed)

## 2017-10-14 NOTE — Assessment & Plan Note (Signed)
Overall stable by history and exam, recent data reviewed with pt, and pt to continue medical treatment as before,  to f/u any worsening symptoms or concerns

## 2017-10-14 NOTE — Assessment & Plan Note (Signed)
Asympt, strong FH DM - for f/u a1c

## 2017-10-14 NOTE — Telephone Encounter (Signed)
Called pt to discuss labs, phone number is not a working number.   Created CRM in case patient calls in to inquire.

## 2017-10-14 NOTE — Progress Notes (Signed)
Subjective:    Patient ID: Joshua Curtis, male    DOB: 1951/04/02, 67 y.o.   MRN: [id number]  HPI  Here for wellness and f/u;  Overall doing ok;  Pt denies Chest pain, worsening SOB, DOE, wheezing, orthopnea, PND, worsening LE edema, palpitations, dizziness or syncope.  Pt denies neurological change such as new headache, facial or extremity weakness.  Pt denies polydipsia, polyuria, or low sugar symptoms. Pt states overall good compliance with treatment and medications, good tolerability, and has been trying to follow appropriate diet.  Pt denies worsening depressive symptoms, suicidal ideation or panic. No fever, night sweats, wt loss, loss of appetite, or other constitutional symptoms.  Pt states good ability with ADL's, has low fall risk, home safety reviewed and adequate, no other significant changes in hearing or vision, and only occasionally active with exercise  Frustrated with parking this am, blames elev BP on this. BP at drug stores have be mostly under 140/90.  To see urology with labs next wk with Dr Karsten Ro.  Did not take the chantix. Did not get Ct Chest for f/u pulm nodule in oct 2018, and did not respond to letter for GI ROV appt in aug 2018, does not think he really needs another OV Past Medical History:  Diagnosis Date  . Anemia, iron deficiency 04/02/2016  . BPH (benign prostatic hyperplasia) 04/10/2017  . COLONIC POLYPS, HX OF 11/24/2007  . DIVERTICULOSIS, COLON 11/24/2007  . ED (erectile dysfunction)   . HYPERTENSION 11/24/2007  . Increased prostate specific antigen (PSA) velocity 06/22/2011   Past Surgical History:  Procedure Laterality Date  . NO PAST SURGERIES      reports that he has been smoking.  he has never used smokeless tobacco. He reports that he drinks alcohol. He reports that he does not use drugs. family history includes Anuerysm in his brother; Diabetes in his father and paternal grandfather; Hypertension in his sister; Kidney failure in his brother; Stroke in  his mother. No Known Allergies Current Outpatient Medications on File Prior to Visit  Medication Sig Dispense Refill  . amLODipine-benazepril (LOTREL) 10-40 MG capsule TAKE 1 CAPSULE BY MOUTH  DAILY 90 capsule 3  . aspirin 81 MG tablet Take 81 mg by mouth daily.      . hydrochlorothiazide (MICROZIDE) 12.5 MG capsule Take 1 capsule (12.5 mg total) by mouth daily. 90 capsule 3  . meloxicam (MOBIC) 15 MG tablet Take 1 tablet (15 mg total) by mouth daily as needed for pain. 90 tablet 1  . omeprazole (PRILOSEC) 20 MG capsule Take 1 capsule (20 mg total) by mouth daily. 90 capsule 3  . varenicline (CHANTIX CONTINUING MONTH PAK) 1 MG tablet Take 1 tablet (1 mg total) by mouth 2 (two) times daily. 60 tablet 1  . varenicline (CHANTIX STARTING MONTH PAK) 0.5 MG X 11 & 1 MG X 42 tablet Take one 0.5 mg tablet by mouth once daily for 3 days, then increase to one 0.5 mg tablet twice daily for 4 days, then increase to one 1 mg tablet twice daily. 53 tablet 0   No current facility-administered medications on file prior to visit.    Review of Systems Constitutional: Negative for other unusual diaphoresis, sweats, appetite or weight changes HENT: Negative for other worsening hearing loss, ear pain, facial swelling, mouth sores or neck stiffness.   Eyes: Negative for other worsening pain, redness or other visual disturbance.  Respiratory: Negative for other stridor or swelling Cardiovascular: Negative for other palpitations or other  chest pain  Gastrointestinal: Negative for worsening diarrhea or loose stools, blood in stool, distention or other pain Genitourinary: Negative for hematuria, flank pain or other change in urine volume.  Musculoskeletal: Negative for myalgias or other joint swelling.  Skin: Negative for other color change, or other wound or worsening drainage.  Neurological: Negative for other syncope or numbness. Hematological: Negative for other adenopathy or swelling Psychiatric/Behavioral:  Negative for hallucinations, other worsening agitation, SI, self-injury, or new decreased concentration All other system neg per pt    Objective:   Physical Exam BP (!) 142/84   Pulse 83   Temp 98.1 F (36.7 C) (Oral)   Ht 5\' 10"  (1.778 m)   Wt 169 lb (76.7 kg)   SpO2 98%   BMI 24.25 kg/m  VS noted,  Constitutional: Pt is oriented to person, place, and time. Appears well-developed and well-nourished, in no significant distress and comfortable Head: Normocephalic and atraumatic  Eyes: Conjunctivae and EOM are normal. Pupils are equal, round, and reactive to light Right Ear: External ear normal without discharge Left Ear: External ear normal without discharge Nose: Nose without discharge or deformity Mouth/Throat: Oropharynx is without other ulcerations and moist  Neck: Normal range of motion. Neck supple. No JVD present. No tracheal deviation present or significant neck LA or mass Cardiovascular: Normal rate, regular rhythm, normal heart sounds and intact distal pulses.   Pulmonary/Chest: WOB normal and breath sounds without rales or wheezing  Abdominal: Soft. Bowel sounds are normal. NT. No HSM  Musculoskeletal: Normal range of motion. Exhibits no edema Lymphadenopathy: Has no other cervical adenopathy.  Neurological: Pt is alert and oriented to person, place, and time. Pt has normal reflexes. No cranial nerve deficit. Motor grossly intact, Gait intact Skin: Skin is warm and dry. No rash noted or new ulcerations Psychiatric:  Has normal mood and affect. Behavior is normal without agitation No other exam findings    Assessment & Plan:

## 2017-10-14 NOTE — Patient Instructions (Signed)
Please continue all other medications as before, and refills have been done if requested.  Please have the pharmacy call with any other refills you may need.  Please continue your efforts at being more active, low cholesterol diet, and weight control.  You are otherwise up to date with prevention measures today.  Please keep your appointments with your specialists as you may have planned  You will be contacted regarding the referral for: CT scan for the Chest to follow up the nodule  Please quit smoking  Please go to the LAB in the Basement (turn left off the elevator) for the tests to be done today  You will be contacted by phone if any changes need to be made immediately.  Otherwise, you will receive a letter about your results with an explanation, but please check with MyChart first.  Please remember to sign up for MyChart if you have not done so, as this will be important to you in the future with finding out test results, communicating by private email, and scheduling acute appointments online when needed.  Please return in 6 months, or sooner if needed

## 2017-10-14 NOTE — Assessment & Plan Note (Signed)
Mult mar 2018, for f/u [facility] at 3 yrs

## 2017-10-14 NOTE — Assessment & Plan Note (Signed)
For ct chest f/u

## 2017-10-14 NOTE — Assessment & Plan Note (Signed)
Urged to quit 

## 2017-10-14 NOTE — Telephone Encounter (Signed)
-----   Message from Biagio Borg, MD sent at 10/14/2017 12:25 PM EST ----- Letter sent, cont same tx except  The test results show that your current treatment is OK, except the Iron level is quite low again.  Please start Nu Iron - 1 tab per day (I will send a prescription), and we will need to refer you back to Gastroenterology, since they wanted you to come back around August 2018 anyway.  You should hear from the office as well.  Redmond Baseman to please inform pt, I will do referral and rx

## 2017-10-14 NOTE — Assessment & Plan Note (Signed)
For urology f/u next wk

## 2017-10-14 NOTE — Assessment & Plan Note (Signed)
For f/u iron today

## 2017-10-15 LAB — HIV ANTIBODY (ROUTINE TESTING W REFLEX): HIV: NONREACTIVE

## 2017-10-21 DIAGNOSIS — R972 Elevated prostate specific antigen [PSA]: Secondary | ICD-10-CM | POA: Diagnosis not present

## 2017-10-27 ENCOUNTER — Ambulatory Visit (INDEPENDENT_AMBULATORY_CARE_PROVIDER_SITE_OTHER)
Admission: RE | Admit: 2017-10-27 | Discharge: 2017-10-27 | Disposition: A | Discharge status: Home / Self Care | Payer: BLUE CROSS/BLUE SHIELD | Source: Ambulatory Visit | Attending: Internal Medicine | Admitting: Internal Medicine

## 2017-10-27 ENCOUNTER — Encounter: Payer: Self-pay | Admitting: Internal Medicine

## 2017-10-27 DIAGNOSIS — R0602 Shortness of breath: Secondary | ICD-10-CM | POA: Diagnosis not present

## 2017-10-27 DIAGNOSIS — I712 Thoracic aortic aneurysm, without rupture, unspecified: Secondary | ICD-10-CM | POA: Insufficient documentation

## 2017-10-27 DIAGNOSIS — R911 Solitary pulmonary nodule: Secondary | ICD-10-CM | POA: Diagnosis not present

## 2017-10-28 ENCOUNTER — Other Ambulatory Visit: Payer: Self-pay | Admitting: Internal Medicine

## 2017-10-29 ENCOUNTER — Telehealth: Payer: Self-pay | Admitting: Internal Medicine

## 2017-10-29 NOTE — Telephone Encounter (Signed)
-----   Message from Biagio Borg, MD sent at 10/27/2017 11:42 AM EST ----- Regarding: thoracic aortic aneurysm Pt due for f/u CTA at one year (Feb 2020)

## 2017-11-12 ENCOUNTER — Encounter: Payer: Self-pay | Admitting: Internal Medicine

## 2018-01-01 DIAGNOSIS — R972 Elevated prostate specific antigen [PSA]: Secondary | ICD-10-CM | POA: Diagnosis not present

## 2018-01-06 DIAGNOSIS — R3915 Urgency of urination: Secondary | ICD-10-CM | POA: Diagnosis not present

## 2018-01-06 DIAGNOSIS — R972 Elevated prostate specific antigen [PSA]: Secondary | ICD-10-CM | POA: Diagnosis not present

## 2018-01-06 DIAGNOSIS — N401 Enlarged prostate with lower urinary tract symptoms: Secondary | ICD-10-CM | POA: Diagnosis not present

## 2018-01-08 ENCOUNTER — Other Ambulatory Visit (INDEPENDENT_AMBULATORY_CARE_PROVIDER_SITE_OTHER): Payer: BLUE CROSS/BLUE SHIELD

## 2018-01-08 ENCOUNTER — Ambulatory Visit (INDEPENDENT_AMBULATORY_CARE_PROVIDER_SITE_OTHER): Payer: BLUE CROSS/BLUE SHIELD | Admitting: Gastroenterology

## 2018-01-08 ENCOUNTER — Encounter: Payer: Self-pay | Admitting: Gastroenterology

## 2018-01-08 VITALS — BP 130/60 | HR 80 | Ht 70.0 in | Wt 168.2 lb

## 2018-01-08 DIAGNOSIS — D509 Iron deficiency anemia, unspecified: Secondary | ICD-10-CM

## 2018-01-08 DIAGNOSIS — K3189 Other diseases of stomach and duodenum: Secondary | ICD-10-CM

## 2018-01-08 LAB — CBC WITH DIFFERENTIAL/PLATELET
BASOS ABS: 0.1 10*3/uL (ref 0.0–0.1)
Basophils Relative: 2.5 % (ref 0.0–3.0)
EOS ABS: 0.5 10*3/uL (ref 0.0–0.7)
Eosinophils Relative: 8.6 % — ABNORMAL HIGH (ref 0.0–5.0)
HEMATOCRIT: 33.6 % — AB (ref 39.0–52.0)
Hemoglobin: 10.9 g/dL — ABNORMAL LOW (ref 13.0–17.0)
LYMPHS PCT: 24.4 % (ref 12.0–46.0)
Lymphs Abs: 1.5 10*3/uL (ref 0.7–4.0)
MCHC: 32.3 g/dL (ref 30.0–36.0)
MCV: 77.6 fl — ABNORMAL LOW (ref 78.0–100.0)
Monocytes Absolute: 0.6 10*3/uL (ref 0.1–1.0)
Monocytes Relative: 9.8 % (ref 3.0–12.0)
NEUTROS ABS: 3.3 10*3/uL (ref 1.4–7.7)
Neutrophils Relative %: 54.7 % (ref 43.0–77.0)
PLATELETS: 291 10*3/uL (ref 150.0–400.0)
RBC: 4.33 Mil/uL (ref 4.22–5.81)
RDW: 22.4 % — ABNORMAL HIGH (ref 11.5–15.5)
WBC: 6.1 10*3/uL (ref 4.0–10.5)

## 2018-01-08 LAB — IBC PANEL
Iron: 42 ug/dL (ref 42–165)
SATURATION RATIOS: 8.3 % — AB (ref 20.0–50.0)
TRANSFERRIN: 361 mg/dL — AB (ref 212.0–360.0)

## 2018-01-08 LAB — FERRITIN: Ferritin: 5.8 ng/mL — ABNORMAL LOW (ref 22.0–322.0)

## 2018-01-08 MED ORDER — FERROUS SULFATE 325 (65 FE) MG PO TABS: 325.0000 mg | ORAL_TABLET | Freq: Every day | ORAL | 1 refills | Status: DC

## 2018-01-08 NOTE — Patient Instructions (Addendum)
If you are age 64 or older, your body mass index should be between 23-30. Your Body mass index is 24.13 kg/m. If this is out of the aforementioned range listed, please consider follow up with your Primary Care Provider.  If you are age 41 or younger, your body mass index should be between 19-25. Your Body mass index is 24.13 kg/m. If this is out of the aformentioned range listed, please consider follow up with your Primary Care Provider.   You have been scheduled for an endoscopy. Please follow written instructions given to you at your visit today. If you use inhalers (even only as needed), please bring them with you on the day of your procedure. Your physician has requested that you go to www.startemmi.com and enter the access code given to you at your visit today. This web site gives a general overview about your procedure. However, you should still follow specific instructions given to you by our office regarding your preparation for the procedure.  Please go to the lab in the basement of our building to have lab work done as you leave today.  We have sent the following medications to your pharmacy for you to pick up at your convenience: Ferrous sulfate 325mg : Take twice a day   Thank you for entrusting me with your care and for choosing [facility], Dr. Horatio Cellar

## 2018-01-08 NOTE — Progress Notes (Signed)
HPI :  67 year old male here for follow-up visit. He's been followed by [place] previously for iron deficiency anemia which is been ongoing for a few years. He had an EGD and colonoscopy with [place] in March 2018. Remarkable findings included a 5 cm hiatal hernia, H. Pylori negative gastritis. He also had some cystic, questionable subepithelial nodules in the duodenal bulb. Superficial biopsies were negative. He had 5 small adenomas on this colonoscopy along with hemorrhoids and diverticulosis but no cause for anemia on colonoscopy. He subsequently had a CT enterography in April 2018 which showed mild antral wall thickening but otherwise no small bowel pathology.  He was given iron and his anemia resolved in 2018. Somewhere along the line he stopped his iron and his hemoglobin was eventually repeated in February which a drop back down to 10.3 along with recurrence of his iron deficiency.he was given iron again but states he stopped it after a short period of time and is no longer taking it.  He states he generally feels well without any complaints. He denies any abdominal pains. He denies any blood in his stools. He denies any changes in his bowel habits. He is not taking any NSAIDs. He is eating well without complaints. His weight is stable. We discussed his iron deficiency and options moving forward.   EGD 11/18/2016 - 5cm HH, patchy gastritis, benign appearing / cystic subepithelial nodules - biopsied - negative for H pylori. Biopsies of duodenal lesions nondiagnostic.  Colonoscopy - 11/18/2016 - 5 small adenomas, diverticulosis, hemorrhoids, otherwise no cause for anemia  CT enterography 12/05/2016 - mild antral wall thickening. No proximal bowel abnormality, pulmonary nodule, enlarged prostate  Remote endoscopies: Colonoscopy 05/16/10 - 2 left sided colon polyps - adenomas Colonoscopy 03/14/2003 - diverticulosis, small left sided polyps, hyperplastic polyps    Past Medical History:  Diagnosis Date  .  Anemia, iron deficiency 04/02/2016  . BPH (benign prostatic hyperplasia) 04/10/2017  . COLONIC POLYPS, HX OF 11/24/2007  . DIVERTICULOSIS, COLON 11/24/2007  . ED (erectile dysfunction)   . HYPERTENSION 11/24/2007  . Increased prostate specific antigen (PSA) velocity 06/22/2011     Past Surgical History:  Procedure Laterality Date  . NO PAST SURGERIES     Family History  Problem Relation Age of Onset  . Stroke Mother   . Diabetes Father   . Hypertension Sister   . Kidney failure Brother        dialysis  . Diabetes Paternal Grandfather   . Anuerysm Brother    Social History   Tobacco Use  . Smoking status: Current Every Day Smoker  . Smokeless tobacco: Never Used  Substance Use Topics  . Alcohol use: Yes    Comment: occasional beer  . Drug use: No   Current Outpatient Medications  Medication Sig Dispense Refill  . amLODipine-benazepril (LOTREL) 10-40 MG capsule TAKE 1 CAPSULE BY MOUTH  DAILY 90 capsule 3  . aspirin 81 MG tablet Take 81 mg by mouth daily.      . hydrochlorothiazide (MICROZIDE) 12.5 MG capsule TAKE 1 CAPSULE BY MOUTH  DAILY 90 capsule 3  . iron polysaccharides (NU-IRON) 150 MG capsule Take 1 capsule (150 mg total) by mouth daily. 90 capsule 1  . meloxicam (MOBIC) 15 MG tablet Take 1 tablet (15 mg total) by mouth daily as needed for pain. 90 tablet 1  . omeprazole (PRILOSEC) 20 MG capsule Take 1 capsule (20 mg total) by mouth daily. 90 capsule 3  . varenicline (CHANTIX CONTINUING MONTH PAK) 1  MG tablet Take 1 tablet (1 mg total) by mouth 2 (two) times daily. 60 tablet 1  . varenicline (CHANTIX STARTING MONTH PAK) 0.5 MG X 11 & 1 MG X 42 tablet Take one 0.5 mg tablet by mouth once daily for 3 days, then increase to one 0.5 mg tablet twice daily for 4 days, then increase to one 1 mg tablet twice daily. 53 tablet 0   No current facility-administered medications for this visit.    No Known Allergies   Review of Systems: All systems reviewed and negative except  where noted in HPI.   Lab Results  Component Value Date   WBC 6.1 01/08/2018   HGB 10.9 (L) 01/08/2018   HCT 33.6 (L) 01/08/2018   MCV 77.6 (L) 01/08/2018   PLT 291.0 01/08/2018    CBC Latest Ref Rng & Units 01/08/2018 10/14/2017 10/03/2016  WBC 4.0 - 10.5 K/uL 6.1 6.1 7.0  Hemoglobin 13.0 - 17.0 g/dL 10.9(L) 10.3(L) 13.2  Hematocrit 39.0 - 52.0 % 33.6(L) 32.8(L) 38.6(L)  Platelets 150.0 - 400.0 K/uL 291.0 358.0 283.0    Lab Results  Component Value Date   CREATININE 1.03 10/14/2017   BUN 13 10/14/2017   NA 141 10/14/2017   K 3.8 10/14/2017   CL 105 10/14/2017   CO2 29 10/14/2017    Lab Results  Component Value Date   ALT 9 10/14/2017   AST 16 10/14/2017   ALKPHOS 55 10/14/2017   BILITOT 0.3 10/14/2017     Physical Exam: BP 130/60   Pulse 80   Ht 5\' 10"  (1.778 m)   Wt 168 lb 3.2 oz (76.3 kg)   BMI 24.13 kg/m  Constitutional: Pleasant,well-developed, male in no acute distress. HEENT: Normocephalic and atraumatic. Conjunctivae are normal. No scleral icterus. Neck supple.  Cardiovascular: Normal rate, regular rhythm.  Pulmonary/chest: Effort normal and breath sounds normal. No wheezing, rales or rhonchi. Abdominal: Soft, nondistended, nontender. There are no masses palpable. No hepatomegaly. Extremities: no edema Lymphadenopathy: No cervical adenopathy noted. Neurological: Alert and oriented to person place and time. Skin: Skin is warm and dry. No rashes noted. Psychiatric: Normal mood and affect. Behavior is normal.   ASSESSMENT AND PLAN: 67 year old male here for reassessment of the following issues:  Iron deficiency anemia / duodenal nodules - unclear etiology for iron deficiency based on last EGD and colonoscopy and enterography study. No pathology noted on multiple prior colonoscopies to account for this. He did have some gastritis noted on his last endoscopy but I don't think this is driving this process. He has what appears to be benign cystic/subepithelial  lesions in the duodenal bulb for which CT scan did not show any pathology last year. This may be an incidental finding / red herring but I'm recommending upper endoscopy to reevaluate this area, assess for interval changes, and we may consider EUS pending the findings. If we otherwise don't see any cause for his iron deficiency on repeat EGD, will recommend capsule endoscopy to clear the small bowel. I discussed iron deficiency with him why we are concerned about this, including potential causes. I'm recommending he continue iron supplementation indefinitely with ferrous sulfate 325 once to twice daily, and he should continue to avoid NSAIDs. I repeated his labs today and his hemoglobin is slightly higher than previous, up to 10.9. Following discussion of EGD and anesthesia he wished proceed.  Lena Cellar, MD [facility]  CC: Biagio Borg, MD

## 2018-02-03 ENCOUNTER — Encounter: Payer: Self-pay | Admitting: Gastroenterology

## 2018-02-03 ENCOUNTER — Ambulatory Visit (AMBULATORY_SURGERY_CENTER): Payer: BLUE CROSS/BLUE SHIELD | Admitting: Gastroenterology

## 2018-02-03 ENCOUNTER — Other Ambulatory Visit: Payer: Self-pay

## 2018-02-03 VITALS — BP 106/54 | HR 79 | Temp 99.1°F | Resp 17 | Ht 70.0 in | Wt 168.0 lb

## 2018-02-03 DIAGNOSIS — K2970 Gastritis, unspecified, without bleeding: Secondary | ICD-10-CM | POA: Diagnosis not present

## 2018-02-03 DIAGNOSIS — Q438 Other specified congenital malformations of intestine: Secondary | ICD-10-CM | POA: Diagnosis not present

## 2018-02-03 DIAGNOSIS — K3189 Other diseases of stomach and duodenum: Secondary | ICD-10-CM | POA: Diagnosis not present

## 2018-02-03 DIAGNOSIS — D509 Iron deficiency anemia, unspecified: Secondary | ICD-10-CM

## 2018-02-03 DIAGNOSIS — K2980 Duodenitis without bleeding: Secondary | ICD-10-CM | POA: Diagnosis not present

## 2018-02-03 MED ORDER — OMEPRAZOLE 40 MG PO CPDR: 40.0000 mg | DELAYED_RELEASE_CAPSULE | Freq: Two times a day (BID) | ORAL | 3 refills | Status: DC

## 2018-02-03 MED ORDER — SODIUM CHLORIDE 0.9 % IV SOLN: 500.0000 mL | Freq: Once | INTRAVENOUS | Status: DC

## 2018-02-03 MED ORDER — OMEPRAZOLE 20 MG PO CPDR: 40.0000 mg | DELAYED_RELEASE_CAPSULE | Freq: Two times a day (BID) | ORAL | 3 refills | Status: DC

## 2018-02-03 NOTE — Patient Instructions (Signed)
YOU HAD AN ENDOSCOPIC PROCEDURE TODAY AT [facility]:   Refer to the procedure report that was given to you for any specific questions about what was found during the examination.  If the procedure report does not answer your questions, please call your gastroenterologist to clarify.  If you requested that your care partner not be given the details of your procedure findings, then the procedure report has been included in a sealed envelope for you to review at your convenience later.  YOU SHOULD EXPECT: Some feelings of bloating in the abdomen. Passage of more gas than usual.  Walking can help get rid of the air that was put into your GI tract during the procedure and reduce the bloating. If you had a lower endoscopy (such as a colonoscopy or flexible sigmoidoscopy) you may notice spotting of blood in your stool or on the toilet paper. If you underwent a bowel prep for your procedure, you may not have a normal bowel movement for a few days.  Please Note:  You might notice some irritation and congestion in your nose or some drainage.  This is from the oxygen used during your procedure.  There is no need for concern and it should clear up in a day or so.  SYMPTOMS TO REPORT IMMEDIATELY:    Following upper endoscopy (EGD)  Vomiting of blood or coffee ground material  New chest pain or pain under the shoulder blades  Painful or persistently difficult swallowing  New shortness of breath  Fever of 100F or higher  Black, tarry-looking stools  For urgent or emergent issues, a gastroenterologist can be reached at any hour by calling [phone].  Please see handouts given to you on Hiatal Hernia and Gastritis. Stop the [facility] and NSAIDS other than baby aspirin.  DIET:  We do recommend a small meal at first, but then you may proceed to your regular diet.  Drink plenty of fluids but you should avoid alcoholic beverages for 24 hours.  ACTIVITY:  You should plan to take it easy for  the rest of today and you should NOT DRIVE or use heavy machinery until tomorrow (because of the sedation medicines used during the test).    FOLLOW UP: Our staff will call the number listed on your records the next business day following your procedure to check on you and address any questions or concerns that you may have regarding the information given to you following your procedure. If we do not reach you, we will leave a message.  However, if you are feeling well and you are not experiencing any problems, there is no need to return our call.  We will assume that you have returned to your regular daily activities without incident.  If any biopsies were taken you will be contacted by phone or by letter within the next 1-3 weeks.  Please call us at [phone] if you have not heard about the biopsies in 3 weeks.    SIGNATURES/CONFIDENTIALITY: You and/or your care partner have signed paperwork which will be entered into your electronic medical record.  These signatures attest to the fact that that the information above on your After Visit Summary has been reviewed and is understood.  Full responsibility of the confidentiality of this discharge information lies with you and/or your care-partner.  Thank you for letting us take care of your healthcare needs today.

## 2018-02-03 NOTE — Op Note (Signed)
[facility] Patient Name: Joshua Curtis Procedure Date: 02/03/2018 10:26 AM MRN: [id number] Endoscopist: Remo Lipps P. Havery Moros , MD Age: 67 Referring MD:  Date of Birth: 12-Aug-1951 Gender: Male Account #: [id number] Procedure:                Upper GI endoscopy Indications:              Iron deficiency anemia, history of benign appearing                            duodenal nodules, history of aspirin use / mobic,                            on omeprazole 20mg  once daily Medicines:                Monitored Anesthesia Care Procedure:                Pre-Anesthesia Assessment:                           - Prior to the procedure, a History and Physical                            was performed, and patient medications and                            allergies were reviewed. The patient's tolerance of                            previous anesthesia was also reviewed. The risks                            and benefits of the procedure and the sedation                            options and risks were discussed with the patient.                            All questions were answered, and informed consent                            was obtained. Prior Anticoagulants: The patient has                            taken no previous anticoagulant or antiplatelet                            agents. ASA Grade Assessment: II - A patient with                            mild systemic disease. After reviewing the risks                            and benefits, the patient was deemed in  satisfactory condition to undergo the procedure.                           After obtaining informed consent, the endoscope was                            passed under direct vision. Throughout the                            procedure, the patient's blood pressure, pulse, and                            oxygen saturations were monitored continuously. The                            Endoscope was  introduced through the mouth, and                            advanced to the second part of duodenum. The upper                            GI endoscopy was accomplished without difficulty.                            The patient tolerated the procedure well. Scope In: Scope Out: Findings:                 Esophagogastric landmarks were identified: the                            Z-line was found at 40 cm, the gastroesophageal                            junction was found at 40 cm and the upper extent of                            the gastric folds was found at 44 cm from the                            incisors.                           A 4 cm hiatal hernia was present.                           The exam of the esophagus was otherwise normal.                           Patchy moderate inflammation characterized by                            adherent old blood, erosions and erythema was found  in the gastric antrum and proximal gastric body.                            Biopsies were taken from the antrum, body, and                            incisura with a cold forceps for Helicobacter                            pylori testing.                           The exam of the stomach was otherwise normal.                           Multiple medium sized subepithelial nodules were                            found in the second portion of the duodenum. Bite                            on bite biopsies were taken from a few of them with                            a cold forceps for histology. No intveral growth                            since last exam.                           Patchy mildly erythematous mucosa was found in the                            duodenal bulb.                           The exam of the duodenum was otherwise normal. Complications:            No immediate complications. Estimated blood loss:                            Minimal. Estimated Blood Loss:      Estimated blood loss was minimal. Impression:               - Esophagogastric landmarks identified.                           - 4 cm hiatal hernia.                           - Gastritis. Biopsied.                           - Benign appearing subepithelial nodules found in  the duodenum. Biopsied.                           - Erythematous duodenopathy.                           I suspect gastritis could be causing anemia based                            on endoscopic findings today Recommendation:           - Patient has a contact number available for                            emergencies. The signs and symptoms of potential                            delayed complications were discussed with the                            patient. Return to normal activities tomorrow.                            Written discharge instructions were provided to the                            patient.                           - Resume previous diet.                           - Continue present medications.                           - Increase omeprazole to 40mg  twice daily                           - Stop Mobic and NSAIDs other than baby aspirin                           - Await pathology results. Remo Lipps P. Drelyn Pistilli, MD 02/03/2018 10:50:22 AM This report has been signed electronically.

## 2018-02-03 NOTE — Progress Notes (Signed)
Spontaneous respirations throughout. VSS. Resting comfortably. To PACU on room air. Report to  RN. 

## 2018-02-03 NOTE — Progress Notes (Signed)
Pt's states no medical or surgical changes since previsit or office visit. 

## 2018-02-03 NOTE — Progress Notes (Signed)
Called to room to assist during endoscopic procedure.  Patient ID and intended procedure confirmed with present staff. Received instructions for my participation in the procedure from the performing physician.  

## 2018-02-04 ENCOUNTER — Telehealth: Payer: Self-pay

## 2018-02-04 NOTE — Telephone Encounter (Signed)
  Follow up Call-  Call back number 02/03/2018 11/18/2016  Post procedure Call Back phone  # [phone] or call wife [phone]  Permission to leave phone message Yes Yes  Some recent data might be hidden     Patient questions:  Do you have a fever, pain , or abdominal swelling? No. Pain Score  0 *  Have you tolerated food without any problems? Yes.    Have you been able to return to your normal activities? Yes.    Do you have any questions about your discharge instructions: Diet   No. Medications  No. Follow up visit  No.  Do you have questions or concerns about your Care? No.  Actions: * If pain score is 4 or above: No action needed, pain <4.

## 2018-02-06 ENCOUNTER — Telehealth: Payer: Self-pay | Admitting: Gastroenterology

## 2018-02-06 MED ORDER — OMEPRAZOLE 40 MG PO CPDR: 40.0000 mg | DELAYED_RELEASE_CAPSULE | Freq: Every day | ORAL | 1 refills | Status: DC

## 2018-02-06 NOTE — Telephone Encounter (Signed)
Rx sent 

## 2018-02-19 ENCOUNTER — Other Ambulatory Visit: Payer: Self-pay

## 2018-02-19 DIAGNOSIS — D509 Iron deficiency anemia, unspecified: Secondary | ICD-10-CM

## 2018-02-19 MED ORDER — OMEPRAZOLE 40 MG PO CPDR: 40.0000 mg | DELAYED_RELEASE_CAPSULE | Freq: Two times a day (BID) | ORAL | 3 refills | Status: DC

## 2018-04-14 ENCOUNTER — Ambulatory Visit (INDEPENDENT_AMBULATORY_CARE_PROVIDER_SITE_OTHER): Payer: BLUE CROSS/BLUE SHIELD | Admitting: Internal Medicine

## 2018-04-14 ENCOUNTER — Telehealth: Payer: Self-pay

## 2018-04-14 ENCOUNTER — Encounter: Payer: Self-pay | Admitting: Internal Medicine

## 2018-04-14 ENCOUNTER — Other Ambulatory Visit (INDEPENDENT_AMBULATORY_CARE_PROVIDER_SITE_OTHER): Payer: BLUE CROSS/BLUE SHIELD

## 2018-04-14 VITALS — BP 110/62 | HR 91 | Temp 98.4°F | Ht 70.0 in | Wt 165.0 lb

## 2018-04-14 DIAGNOSIS — R739 Hyperglycemia, unspecified: Secondary | ICD-10-CM | POA: Diagnosis not present

## 2018-04-14 DIAGNOSIS — R972 Elevated prostate specific antigen [PSA]: Secondary | ICD-10-CM | POA: Diagnosis not present

## 2018-04-14 DIAGNOSIS — D509 Iron deficiency anemia, unspecified: Secondary | ICD-10-CM | POA: Diagnosis not present

## 2018-04-14 DIAGNOSIS — I10 Essential (primary) hypertension: Secondary | ICD-10-CM

## 2018-04-14 DIAGNOSIS — F17200 Nicotine dependence, unspecified, uncomplicated: Secondary | ICD-10-CM

## 2018-04-14 DIAGNOSIS — Z0000 Encounter for general adult medical examination without abnormal findings: Secondary | ICD-10-CM

## 2018-04-14 DIAGNOSIS — N401 Enlarged prostate with lower urinary tract symptoms: Secondary | ICD-10-CM | POA: Diagnosis not present

## 2018-04-14 LAB — CBC WITH DIFFERENTIAL/PLATELET
BASOS PCT: 0.4 % (ref 0.0–3.0)
Basophils Absolute: 0 10*3/uL (ref 0.0–0.1)
EOS PCT: 8.4 % — AB (ref 0.0–5.0)
Eosinophils Absolute: 0.6 10*3/uL (ref 0.0–0.7)
HEMATOCRIT: 32.7 % — AB (ref 39.0–52.0)
HEMOGLOBIN: 10.8 g/dL — AB (ref 13.0–17.0)
LYMPHS PCT: 28.3 % (ref 12.0–46.0)
Lymphs Abs: 1.9 10*3/uL (ref 0.7–4.0)
MCHC: 32.8 g/dL (ref 30.0–36.0)
MCV: 80 fl (ref 78.0–100.0)
MONO ABS: 0.7 10*3/uL (ref 0.1–1.0)
Monocytes Relative: 9.9 % (ref 3.0–12.0)
Neutro Abs: 3.6 10*3/uL (ref 1.4–7.7)
Neutrophils Relative %: 53 % (ref 43.0–77.0)
Platelets: 322 10*3/uL (ref 150.0–400.0)
RBC: 4.1 Mil/uL — AB (ref 4.22–5.81)
RDW: 20 % — ABNORMAL HIGH (ref 11.5–15.5)
WBC: 6.7 10*3/uL (ref 4.0–10.5)

## 2018-04-14 LAB — BASIC METABOLIC PANEL
BUN: 17 mg/dL (ref 6–23)
CHLORIDE: 104 meq/L (ref 96–112)
CO2: 32 meq/L (ref 19–32)
Calcium: 9.4 mg/dL (ref 8.4–10.5)
Creatinine, Ser: 1.26 mg/dL (ref 0.40–1.50)
GFR: 73.85 mL/min (ref >60.00)
GLUCOSE: 103 mg/dL — AB (ref 70–99)
POTASSIUM: 4.2 meq/L (ref 3.5–5.1)
SODIUM: 141 meq/L (ref 135–145)

## 2018-04-14 LAB — IBC PANEL
Iron: 41 ug/dL — ABNORMAL LOW (ref 42–165)
SATURATION RATIOS: 7.7 % — AB (ref 20.0–50.0)
Transferrin: 379 mg/dL — ABNORMAL HIGH (ref 212.0–360.0)

## 2018-04-14 LAB — HEMOGLOBIN A1C: Hgb A1c MFr Bld: 6.4 % (ref 4.6–6.5)

## 2018-04-14 MED ORDER — VARENICLINE TARTRATE 0.5 MG X 11 & 1 MG X 42 PO MISC: ORAL | 0 refills | Status: DC

## 2018-04-14 MED ORDER — VARENICLINE TARTRATE 1 MG PO TABS: 1.0000 mg | ORAL_TABLET | Freq: Two times a day (BID) | ORAL | 1 refills | Status: DC

## 2018-04-14 NOTE — Assessment & Plan Note (Signed)
For labs today, refer GI

## 2018-04-14 NOTE — Assessment & Plan Note (Signed)
Ok for chantix asd,  to f/u any worsening symptoms or concerns 

## 2018-04-14 NOTE — Telephone Encounter (Signed)
Pt has been informed and expressed understanding.  

## 2018-04-14 NOTE — Assessment & Plan Note (Signed)
Lab Results  Component Value Date   HGBA1C 6.4 04/14/2018  stable overall by history and exam, recent data reviewed with pt, and pt to continue medical treatment as before,  to f/u any worsening symptoms or concerns

## 2018-04-14 NOTE — Assessment & Plan Note (Signed)
For urology f/u 

## 2018-04-14 NOTE — Patient Instructions (Signed)
You are given the work letter today  Please take all new medication as prescribed - the chantix  Please continue all other medications as before, and refills have been done if requested.  Please have the pharmacy call with any other refills you may need.  Please continue your efforts at being more active, low cholesterol diet, and weight control.  You are otherwise up to date with prevention measures today.  Please keep your appointments with your specialists as you may have planned - urology for the high PSA  You will be contacted regarding the referral for: Dr Havery Moros  Please go to the LAB in the Basement (turn left off the elevator) for the tests to be done today  You will be contacted by phone if any changes need to be made immediately.  Otherwise, you will receive a letter about your results with an explanation, but please check with MyChart first.  Please remember to sign up for MyChart if you have not done so, as this will be important to you in the future with finding out test results, communicating by private email, and scheduling acute appointments online when needed.  Please return in 6 months, or sooner if needed, with Lab testing done 3-5 days before

## 2018-04-14 NOTE — Assessment & Plan Note (Signed)
stable overall by history and exam, recent data reviewed with pt, and pt to continue medical treatment as before,  to f/u any worsening symptoms or concerns BP Readings from Last 3 Encounters:  04/14/18 110/62  02/03/18 (!) 106/54  01/08/18 130/60

## 2018-04-14 NOTE — Progress Notes (Signed)
Subjective:    Patient ID: Joshua Curtis, male    DOB: 02-28-1951, 67 y.o.   MRN: [id number]  HPI  Here to f/u; overall doing ok,  Pt denies chest pain, increasing sob or doe, wheezing, orthopnea, PND, increased LE swelling, palpitations, dizziness or syncope.  Pt denies new neurological symptoms such as new headache, or facial or extremity weakness or numbness.  Pt denies polydipsia, polyuria, or low sugar episode.  Pt states overall good compliance with meds, mostly trying to follow appropriate diet, with wt overall stable.  Has ongoing issue with recurrent iron deficiency anemia, requests referral back to GI.  Denies urinary symptoms such as dysuria, frequency, urgency, flank pain, hematuria or n/v, fever, chills.  Wants to quit smoking Past Medical History:  Diagnosis Date  . Anemia, iron deficiency 04/02/2016  . BPH (benign prostatic hyperplasia) 04/10/2017  . COLONIC POLYPS, HX OF 11/24/2007  . DIVERTICULOSIS, COLON 11/24/2007  . ED (erectile dysfunction)   . HYPERTENSION 11/24/2007  . Increased prostate specific antigen (PSA) velocity 06/22/2011   Past Surgical History:  Procedure Laterality Date  . NO PAST SURGERIES      reports that he has been smoking. He has never used smokeless tobacco. He reports that he drinks alcohol. He reports that he does not use drugs. family history includes Anuerysm in his brother; Diabetes in his father and paternal grandfather; Hypertension in his sister; Kidney failure in his brother; Stroke in his mother. No Known Allergies Current Outpatient Medications on File Prior to Visit  Medication Sig Dispense Refill  . amLODipine-benazepril (LOTREL) 10-40 MG capsule TAKE 1 CAPSULE BY MOUTH  DAILY 90 capsule 3  . aspirin 81 MG tablet Take 81 mg by mouth daily.      . chlorhexidine (PERIDEX) 0.12 % solution SWISH AND EXPEL 15 ML BY MOUTH TWICE A DAY UNTIL FINISHED  1  . ferrous sulfate 325 (65 FE) MG tablet Take 1 tablet (325 mg total) by mouth daily with  breakfast. 180 tablet 1  . hydrochlorothiazide (MICROZIDE) 12.5 MG capsule TAKE 1 CAPSULE BY MOUTH  DAILY 90 capsule 3  . iron polysaccharides (NU-IRON) 150 MG capsule Take 1 capsule (150 mg total) by mouth daily. 90 capsule 1  . omeprazole (PRILOSEC) 40 MG capsule Take 1 capsule (40 mg total) by mouth 2 (two) times daily. 60 capsule 3   Current Facility-Administered Medications on File Prior to Visit  Medication Dose Route Frequency Provider Last Rate Last Dose  . 0.9 %  sodium chloride infusion  500 mL Intravenous Once Armbruster, Carlota Raspberry, MD       Review of Systems  Constitutional: Negative for other unusual diaphoresis or sweats HENT: Negative for ear discharge or swelling Eyes: Negative for other worsening visual disturbances Respiratory: Negative for stridor or other swelling  Gastrointestinal: Negative for worsening distension or other blood Genitourinary: Negative for retention or other urinary change Musculoskeletal: Negative for other MSK pain or swelling Skin: Negative for color change or other new lesions Neurological: Negative for worsening tremors and other numbness  Psychiatric/Behavioral: Negative for worsening agitation or other fatigue All other system neg per pt    Objective:   Physical Exam BP 110/62   Pulse 91   Temp 98.4 F (36.9 C) (Oral)   Ht 5\' 10"  (1.778 m)   Wt 165 lb (74.8 kg)   SpO2 93%   BMI 23.68 kg/m  VS noted,  Constitutional: Pt appears in NAD HENT: Head: NCAT.  Right Ear: External ear normal.  Left Ear: External ear normal.  Eyes: . Pupils are equal, round, and reactive to light. Conjunctivae and EOM are normal Nose: without d/c or deformity Neck: Neck supple. Gross normal ROM Cardiovascular: Normal rate and regular rhythm.   Pulmonary/Chest: Effort normal and breath sounds without rales or wheezing.  Abd:  Soft, NT, ND, + BS, no organomegaly Neurological: Pt is alert. At baseline orientation, motor grossly intact Skin: Skin is warm. No  rashes, other new lesions, no LE edema Psychiatric: Pt behavior is normal without agitation  No other exam findings  Lab Results  Component Value Date   WBC 6.1 01/08/2018   HGB 10.9 (L) 01/08/2018   HCT 33.6 (L) 01/08/2018   PLT 291.0 01/08/2018   GLUCOSE 93 10/14/2017   CHOL 111 10/14/2017   TRIG 59.0 10/14/2017   HDL 34.40 (L) 10/14/2017   LDLDIRECT 85.0 10/03/2016   LDLCALC 65 10/14/2017   ALT 9 10/14/2017   AST 16 10/14/2017   NA 141 10/14/2017   K 3.8 10/14/2017   CL 105 10/14/2017   CREATININE 1.03 10/14/2017   BUN 13 10/14/2017   CO2 29 10/14/2017   TSH 1.40 10/14/2017   PSA 7.31 (H) 10/03/2016   HGBA1C 6.2 10/14/2017       Assessment & Plan:

## 2018-04-14 NOTE — Telephone Encounter (Signed)
-----   Message from Biagio Borg, MD sent at 04/14/2018 12:25 PM EDT ----- Letter sent, cont same tx except  The test results show that your current treatment is OK, except the iron is starting to get low again.  Please restart the iron treatment and continue taking.  Please follow up with Gastroenterology as planned.    Arletta Lumadue to please inform pt

## 2018-06-11 ENCOUNTER — Encounter: Payer: Self-pay | Admitting: Gastroenterology

## 2018-06-11 ENCOUNTER — Ambulatory Visit (INDEPENDENT_AMBULATORY_CARE_PROVIDER_SITE_OTHER): Payer: BLUE CROSS/BLUE SHIELD | Admitting: Gastroenterology

## 2018-06-11 ENCOUNTER — Other Ambulatory Visit (INDEPENDENT_AMBULATORY_CARE_PROVIDER_SITE_OTHER): Payer: BLUE CROSS/BLUE SHIELD

## 2018-06-11 ENCOUNTER — Other Ambulatory Visit: Payer: Self-pay

## 2018-06-11 VITALS — BP 110/50 | HR 84 | Ht 70.0 in | Wt 166.0 lb

## 2018-06-11 DIAGNOSIS — D509 Iron deficiency anemia, unspecified: Secondary | ICD-10-CM

## 2018-06-11 DIAGNOSIS — K2970 Gastritis, unspecified, without bleeding: Secondary | ICD-10-CM | POA: Diagnosis not present

## 2018-06-11 DIAGNOSIS — K2990 Gastroduodenitis, unspecified, without bleeding: Secondary | ICD-10-CM

## 2018-06-11 LAB — CBC WITH DIFFERENTIAL/PLATELET
BASOS ABS: 0.2 10*3/uL — AB (ref 0.0–0.1)
Basophils Relative: 2.5 % (ref 0.0–3.0)
EOS PCT: 7.6 % — AB (ref 0.0–5.0)
Eosinophils Absolute: 0.5 10*3/uL (ref 0.0–0.7)
HCT: 32.4 % — ABNORMAL LOW (ref 39.0–52.0)
HEMOGLOBIN: 10.5 g/dL — AB (ref 13.0–17.0)
Lymphocytes Relative: 19.4 % (ref 12.0–46.0)
Lymphs Abs: 1.2 10*3/uL (ref 0.7–4.0)
MCHC: 32.3 g/dL (ref 30.0–36.0)
MCV: 79.8 fl (ref 78.0–100.0)
MONO ABS: 0.6 10*3/uL (ref 0.1–1.0)
MONOS PCT: 8.8 % (ref 3.0–12.0)
Neutro Abs: 4 10*3/uL (ref 1.4–7.7)
Neutrophils Relative %: 61.7 % (ref 43.0–77.0)
Platelets: 384 10*3/uL (ref 150.0–400.0)
RBC: 4.07 Mil/uL — AB (ref 4.22–5.81)
RDW: 19.2 % — ABNORMAL HIGH (ref 11.5–15.5)
WBC: 6.4 10*3/uL (ref 4.0–10.5)

## 2018-06-11 MED ORDER — FERROUS SULFATE 325 (65 FE) MG PO TABS: 325.0000 mg | ORAL_TABLET | Freq: Two times a day (BID) | ORAL | 1 refills | Status: DC

## 2018-06-11 MED ORDER — OMEPRAZOLE 40 MG PO CPDR: 40.0000 mg | DELAYED_RELEASE_CAPSULE | Freq: Two times a day (BID) | ORAL | 1 refills | Status: DC

## 2018-06-11 NOTE — Patient Instructions (Addendum)
If you are age 65 or older, your body mass index should be between 23-30. Your Body mass index is 23.82 kg/m. If this is out of the aforementioned range listed, please consider follow up with your Primary Care Provider.  We have sent the following medications to your pharmacy for you to pick up at your convenience: Omeprazole 40 mg: Take twice a day  Please avoid all NSAIDs.  Continue taking aspirin.  Please go to the lab in the basement of our building to have lab work done as you leave today.      CAPSOCAM CAPSULE ENDOSCOPY PATIENT INSTRUCTION SHEET  Joshua Curtis [birth year] [id number]   1. 06/15/18 Seven (7) days prior to capsule endoscopy stop taking iron supplements and carafate.  2. 10/19/19Two (2) days prior to capsule endoscopy stop taking any arthritis drugs.  3. 06/21/18 Day before capsule endoscopy purchase a 238 gram bottle of Miralax from the laxative section of your drug store, and a 32 oz. bottle of Gatorade (no red/purple).    4. 06/21/18 One (1) day prior to capsule endoscopy: a) Stop smoking. b) Eat a regular diet until 12:00 Noon. c) After 12:00 Noon take only the following: Black coffee  Jell-O (no fruit or red Jell-o) Water   Bouillon (chicken or beef) 7-Up   Cranberry Juice Tea   Kool-Aid Popsicle (not red) Sprite   Coke Ginger Ale  Pepsi Mountain Dew Gatorade d) At 6:00 pm the evening before your appointment, drink 7 capfuls (105 grams) of Miralax with 32 oz. Gatorade. Drink 8 oz every 15 minutes until gone. e) Nothing to eat or drink after midnight except medications with a sip of water.  5. 06/22/18 Day of capsule endoscopy:            Do not have anything to eat or drink after midnight No medications for 2 hours prior to your test.  6. Please arrive at [facility]  3rd floor patient registration area by 8:00 am on: 06/22/18.   For any questions: Call [facility] at [phone] and ask to speak with one of the capsule endoscopy  nurses.   What you should know.   You have been scheduled for a Capsule Endoscopy with Capsocam.  You will swallow a vitamin size pill that contains cameras that will take pictures of your small intestine (bowel).  Your small bowel connects to your stomach on one end and the large intestine or bowel on the other. The capsule moves through the gastrointestinal tract taking pictures along the way. The images are stored on the capsule.  1-5 days after the capsule ingestion you will retrieve the capsule and mail it in a prepaid envelope to [facility] to download the images.  Your physician will be provided a copy of the images to review.   Who should have a capsule endoscopy?  You may need a small bowel capsule endoscopy if you have symptoms, such as blood in your stool, chronic pain, diarrhea, or unexplained anemia. The small intestine is a hollow organ that cannot be easily visualized with a scope due to its length.  The capsule endoscopy allows your physician to directly visualize the intestine.  The pictures may show intestine growths, inflammation, or bleeding in the small intestine.   What are the risks with a capsule endoscopy?  The pictures may not be clear or give [place] a clear cause of your symptoms The capsule may become trapped in the esophagus or intestines.  You may need surgery or endoscopic  procedures to remove the capsule. You may not have a MRI until the capsule endoscopy has been retrieved.   How do I retrieve the capsule?  You will be provided a kit with step-by-step instructions for capsule retrieval and mailing.  You should expect to retrieve the capsule in 1-5 days after ingestion.  This will depend on your individual bowel habits.    Thank you for entrusting me with your care and for choosing [facility], Dr. [place] Cellar

## 2018-06-11 NOTE — Progress Notes (Signed)
HPI :  67 year old male here for a follow-up visit. He is known to me for prior workup for iron deficiency anemia. His prior workup for this issue is as outlined as below:  EGD 11/18/2016 - 5cm HH, patchy gastritis, benign appearing / cystic subepithelial nodules - biopsied - negative for H pylori. Biopsies of duodenal lesions nondiagnostic.  Colonoscopy - 11/18/2016 - 5 small adenomas, diverticulosis, hemorrhoids, otherwise no cause for anemia CT enterography 12/05/2016 - mild antral wall thickening. No proximal bowel abnormality, pulmonary nodule, enlarged prostate EGD 02/03/2018 - 4cm HH, gastritis, benign appearing cystic duodenal nodules - biopsies for H pylori negative, duodenal biopsies negative   Since I have last seem him in the office he had an EGD done, which showed some gastritis in the setting of using NSAIDs. I increased omeprazole to 40mg  BID. He was previously on Mobic and asked that he stop it.   Repeat CBC on 8/13 shows Hgb of 10.8 along with MCV of 80, with persistent iron deficiency.  He denies any blood in his stools. He denies any abdominal pains. He denies any NSAID use. He does take a baby aspirin. He was taking omeprazole twice daily for some time after his last EGD however he is not sure when he stopped it. I don't see it on his medication list. He has been on and off oral iron, he is currently on it once daily. He denies any abdominal pains. He otherwise is feeling pretty well.    Past Medical History:  Diagnosis Date  . Anemia, iron deficiency 04/02/2016  . BPH (benign prostatic hyperplasia) 04/10/2017  . COLONIC POLYPS, HX OF 11/24/2007  . DIVERTICULOSIS, COLON 11/24/2007  . ED (erectile dysfunction)   . HYPERTENSION 11/24/2007  . Increased prostate specific antigen (PSA) velocity 06/22/2011     Past Surgical History:  Procedure Laterality Date  . NO PAST SURGERIES     Family History  Problem Relation Age of Onset  . Stroke Mother   . Diabetes Father   .  Hypertension Sister   . Kidney failure Brother        dialysis  . Diabetes Paternal Grandfather   . Anuerysm Brother    Social History   Tobacco Use  . Smoking status: Current Every Day Smoker  . Smokeless tobacco: Never Used  Substance Use Topics  . Alcohol use: Yes    Comment: occasional beer  . Drug use: No   Current Outpatient Medications  Medication Sig Dispense Refill  . amLODipine-benazepril (LOTREL) 10-40 MG capsule TAKE 1 CAPSULE BY MOUTH  DAILY 90 capsule 3  . aspirin 81 MG tablet Take 81 mg by mouth daily.      . ferrous sulfate 325 (65 FE) MG tablet Take 1 tablet (325 mg total) by mouth daily with breakfast. 180 tablet 1  . hydrochlorothiazide (MICROZIDE) 12.5 MG capsule TAKE 1 CAPSULE BY MOUTH  DAILY 90 capsule 3   No current facility-administered medications for this visit.    No Known Allergies   Review of Systems: All systems reviewed and negative except where noted in HPI.   Lab Results  Component Value Date   WBC 6.4 06/11/2018   HGB 10.5 (L) 06/11/2018   HCT 32.4 (L) 06/11/2018   MCV 79.8 06/11/2018   PLT 384.0 06/11/2018    Lab Results  Component Value Date   CREATININE 1.26 04/14/2018   BUN 17 04/14/2018   NA 141 04/14/2018   K 4.2 04/14/2018   CL 104 04/14/2018  CO2 32 04/14/2018   Lab Results  Component Value Date   CREATININE 1.26 04/14/2018   BUN 17 04/14/2018   NA 141 04/14/2018   K 4.2 04/14/2018   CL 104 04/14/2018   CO2 32 04/14/2018     Physical Exam: BP (!) 110/50   Pulse 84   Ht 5\' 10"  (1.778 m)   Wt 166 lb (75.3 kg)   BMI 23.82 kg/m  Constitutional: Pleasant,well-developed, male in no acute distress. HEENT: Normocephalic and atraumatic. Conjunctivae are normal. No scleral icterus. Neck supple.  Cardiovascular: Normal rate, regular rhythm.  Pulmonary/chest: Effort normal and breath sounds normal. No wheezing, rales or rhonchi. Abdominal: Soft, nondistended, nontender.There are no masses palpable. No  hepatomegaly. Extremities: no edema Lymphadenopathy: No cervical adenopathy noted. Neurological: Alert and oriented to person place and time. Skin: Skin is warm and dry. No rashes noted. Psychiatric: Normal mood and affect. Behavior is normal.   ASSESSMENT AND PLAN: 67 year old male here for reassessment of the following issues:  Iron deficiency anemia / gastritis - no clear pathology noted on colonoscopy or enterography in the past. Based off his last EGD in this Spring I thought gastritis may be driving this process. He was placed on twice daily PPI as well as oral iron. He was supposed to continue that indefinitely until his follow-up with me, unclear what happened, he apparently stopped it at some point in time, is unclear when he stopped PPI and has been on and off oral iron. Given his anemia has persisted, I'm recommending a capsule endoscopy to further assess the small intestine for pathology. He will do this while he is back on twice daily PPI, this exam will also allow [place] to see if his gastritis is active. He previously responded to oral iron, but now given the anemia is recurrent, I would like him to increase his iron to 2 tablets a day and hopefully this in conjunction with PPI will help resolve it (he will resume omeprazole 40mg  BID). If he fails to respond to oral iron will need to place him on IV iron. Regarding the duodenal cystic nodules which are small and noted incidentally in the duodenal bulb, nothing on CT previously in this regard. EGD over time has not shown any interval enlargement and I do not think these are causing any problems, biopsied twice and normal. We'll await capsule study to see if this identifies any other lesions, and if so may consider EUS. I discussed risks and benefits of capsule endoscopy with him and he wanted to proceed. Further recommendations pending the results of this. He agreed to take PPI and iron until otherwise noted. He will avoid NSAIDs other than  aspirin 81mg .  [place] Cellar, MD [facility]

## 2018-06-22 ENCOUNTER — Encounter: Payer: Self-pay | Admitting: Gastroenterology

## 2018-06-22 ENCOUNTER — Ambulatory Visit (INDEPENDENT_AMBULATORY_CARE_PROVIDER_SITE_OTHER): Payer: BLUE CROSS/BLUE SHIELD | Admitting: Gastroenterology

## 2018-06-22 DIAGNOSIS — D509 Iron deficiency anemia, unspecified: Secondary | ICD-10-CM

## 2018-06-22 DIAGNOSIS — K317 Polyp of stomach and duodenum: Secondary | ICD-10-CM | POA: Diagnosis not present

## 2018-06-22 DIAGNOSIS — I8501 Esophageal varices with bleeding: Secondary | ICD-10-CM | POA: Diagnosis not present

## 2018-06-22 DIAGNOSIS — K2970 Gastritis, unspecified, without bleeding: Secondary | ICD-10-CM | POA: Diagnosis not present

## 2018-06-22 NOTE — Patient Instructions (Signed)
  Pt tolerated well   Lot number [id number] EXP 08-13-2019 SN [id number].[place]

## 2018-07-07 DIAGNOSIS — R972 Elevated prostate specific antigen [PSA]: Secondary | ICD-10-CM | POA: Diagnosis not present

## 2018-07-09 ENCOUNTER — Telehealth: Payer: Self-pay | Admitting: Gastroenterology

## 2018-07-09 NOTE — Telephone Encounter (Signed)
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 Almyra Free can you let the patient know. AVMs could be contributing to the anemia He should be on oral iron BID and his PPI I would like him to repeat a CBC in 6 weeks. If he remains deficient at that time will plan for IV iron. Thanks

## 2018-07-10 ENCOUNTER — Other Ambulatory Visit: Payer: Self-pay

## 2018-07-10 DIAGNOSIS — D509 Iron deficiency anemia, unspecified: Secondary | ICD-10-CM

## 2018-07-10 NOTE — Telephone Encounter (Signed)
Patient advised of results and recommendations. Order for repeat labs in [place], reminder placed to contact patient in 6 weeks.

## 2018-07-14 DIAGNOSIS — R972 Elevated prostate specific antigen [PSA]: Secondary | ICD-10-CM | POA: Diagnosis not present

## 2018-07-14 DIAGNOSIS — N400 Enlarged prostate without lower urinary tract symptoms: Secondary | ICD-10-CM | POA: Diagnosis not present

## 2018-07-21 ENCOUNTER — Telehealth: Payer: Self-pay

## 2018-07-21 NOTE — Telephone Encounter (Signed)
Called pt. His cell phone number is not accepting messages.  Sent him a letter about going to the lab (cbc to check anemia - lab order in place)

## 2018-07-21 NOTE — Telephone Encounter (Signed)
-----   Message from Roetta Sessions, [place] sent at 06/11/2018  5:38 PM EDT ----- Regarding: cbc due mid November Cbc to check anemia due week of Nov 18th

## 2018-08-17 ENCOUNTER — Other Ambulatory Visit: Payer: Self-pay

## 2018-09-10 ENCOUNTER — Other Ambulatory Visit (INDEPENDENT_AMBULATORY_CARE_PROVIDER_SITE_OTHER): Payer: BLUE CROSS/BLUE SHIELD

## 2018-09-10 DIAGNOSIS — D509 Iron deficiency anemia, unspecified: Secondary | ICD-10-CM

## 2018-09-10 LAB — CBC WITH DIFFERENTIAL/PLATELET
BASOS ABS: 0 10*3/uL (ref 0.0–0.1)
Basophils Relative: 0.5 % (ref 0.0–3.0)
EOS PCT: 7.4 % — AB (ref 0.0–5.0)
Eosinophils Absolute: 0.5 10*3/uL (ref 0.0–0.7)
HEMATOCRIT: 30.2 % — AB (ref 39.0–52.0)
HEMOGLOBIN: 9.7 g/dL — AB (ref 13.0–17.0)
LYMPHS PCT: 26.6 % (ref 12.0–46.0)
Lymphs Abs: 1.8 10*3/uL (ref 0.7–4.0)
MCHC: 32.1 g/dL (ref 30.0–36.0)
MCV: 75.1 fl — AB (ref 78.0–100.0)
MONOS PCT: 10.8 % (ref 3.0–12.0)
Monocytes Absolute: 0.7 10*3/uL (ref 0.1–1.0)
Neutro Abs: 3.7 10*3/uL (ref 1.4–7.7)
Neutrophils Relative %: 54.7 % (ref 43.0–77.0)
Platelets: 395 10*3/uL (ref 150.0–400.0)
RBC: 4.02 Mil/uL — AB (ref 4.22–5.81)
RDW: 18.9 % — ABNORMAL HIGH (ref 11.5–15.5)
WBC: 6.8 10*3/uL (ref 4.0–10.5)

## 2018-09-14 ENCOUNTER — Other Ambulatory Visit: Payer: Self-pay

## 2018-09-14 DIAGNOSIS — D509 Iron deficiency anemia, unspecified: Secondary | ICD-10-CM

## 2018-09-14 NOTE — Progress Notes (Addendum)
Repeat lab work in 3-4 weeks per MD recommendations; Order in [place];  Patient advised of repeat lab work needed in 3-4 weeks; Patient verbalized understanding of information/instructions; Patient was advised to call back if questions/concerns arise;

## 2018-09-17 ENCOUNTER — Ambulatory Visit (HOSPITAL_COMMUNITY)
Admission: RE | Admit: 2018-09-17 | Discharge: 2018-09-17 | Disposition: A | Discharge status: Home / Self Care | Payer: BLUE CROSS/BLUE SHIELD | Source: Ambulatory Visit | Attending: Gastroenterology | Admitting: Gastroenterology

## 2018-09-17 DIAGNOSIS — D509 Iron deficiency anemia, unspecified: Secondary | ICD-10-CM | POA: Diagnosis not present

## 2018-09-17 MED ORDER — SODIUM CHLORIDE 0.9 % IV SOLN
INTRAVENOUS | Status: DC | PRN
  Administered 2018-09-17: 250 mL via INTRAVENOUS

## 2018-09-17 MED ORDER — SODIUM CHLORIDE 0.9 % IV SOLN
510.0000 mg | INTRAVENOUS | Status: DC
  Administered 2018-09-17: 510 mg via INTRAVENOUS
  Filled 2018-09-17: qty 17

## 2018-09-17 NOTE — Progress Notes (Signed)
    Procedure: Feraheme  Note:  Patient received Feraheme infusion,tolerated the full amount. Post 30 minutes vital signs stable. Discharge instructions provided,verbalized understanding. Alert, oriented and ambulatory at discharge.

## 2018-09-17 NOTE — Discharge Instructions (Signed)
Ferumoxytol injection °What is this medicine? °FERUMOXYTOL is an iron complex. Iron is used to make healthy red blood cells, which carry oxygen and nutrients throughout the body. This medicine is used to treat iron deficiency anemia. °This medicine may be used for other purposes; ask your health care provider or pharmacist if you have questions. °COMMON BRAND NAME(S): Feraheme °What should I tell my health care provider before I take this medicine? °They need to know if you have any of these conditions: °-anemia not caused by low iron levels °-high levels of iron in the blood °-magnetic resonance imaging (MRI) test scheduled °-an unusual or allergic reaction to iron, other medicines, foods, dyes, or preservatives °-pregnant or trying to get pregnant °-breast-feeding °How should I use this medicine? °This medicine is for injection into a vein. It is given by a health care professional in a hospital or clinic setting. °Talk to your pediatrician regarding the use of this medicine in children. Special care may be needed. °Overdosage: If you think you have taken too much of this medicine contact a poison control center or emergency room at once. °NOTE: This medicine is only for you. Do not share this medicine with others. °What if I miss a dose? °It is important not to miss your dose. Call your doctor or health care professional if you are unable to keep an appointment. °What may interact with this medicine? °This medicine may interact with the following medications: °-other iron products °This list may not describe all possible interactions. Give your health care provider a list of all the medicines, herbs, non-prescription drugs, or dietary supplements you use. Also tell them if you smoke, drink alcohol, or use illegal drugs. Some items may interact with your medicine. °What should I watch for while using this medicine? °Visit your doctor or healthcare professional regularly. Tell your doctor or healthcare professional  if your symptoms do not start to get better or if they get worse. You may need blood work done while you are taking this medicine. °You may need to follow a special diet. Talk to your doctor. Foods that contain iron include: whole grains/cereals, dried fruits, beans, or peas, leafy green vegetables, and organ meats (liver, kidney). °What side effects may I notice from receiving this medicine? °Side effects that you should report to your doctor or health care professional as soon as possible: °-allergic reactions like skin rash, itching or hives, swelling of the face, lips, or tongue °-breathing problems °-changes in blood pressure °-feeling faint or lightheaded, falls °-fever or chills °-flushing, sweating, or hot feelings °-swelling of the ankles or feet °Side effects that usually do not require medical attention (report to your doctor or health care professional if they continue or are bothersome): °-diarrhea °-headache °-nausea, vomiting °-stomach pain °This list may not describe all possible side effects. Call your doctor for medical advice about side effects. You may report side effects to FDA at 1-800-FDA-1088. °Where should I keep my medicine? °This drug is given in a hospital or clinic and will not be stored at home. °NOTE: This sheet is a summary. It may not cover all possible information. If you have questions about this medicine, talk to your doctor, pharmacist, or health care provider. °© 2019 [employer] (2016-10-07 20:21:10) °Ferumoxytol injection °What is this medicine? °FERUMOXYTOL is an iron complex. Iron is used to make healthy red blood cells, which carry oxygen and nutrients throughout the body. This medicine is used to treat iron deficiency anemia. °This medicine may be used   for other purposes; ask your health care provider or pharmacist if you have questions. °COMMON BRAND NAME(S): Feraheme °What should I tell my health care provider before I take this medicine? °They need to know if  you have any of these conditions: °-anemia not caused by low iron levels °-high levels of iron in the blood °-magnetic resonance imaging (MRI) test scheduled °-an unusual or allergic reaction to iron, other medicines, foods, dyes, or preservatives °-pregnant or trying to get pregnant °-breast-feeding °How should I use this medicine? °This medicine is for injection into a vein. It is given by a health care professional in a hospital or clinic setting. °Talk to your pediatrician regarding the use of this medicine in children. Special care may be needed. °Overdosage: If you think you have taken too much of this medicine contact a poison control center or emergency room at once. °NOTE: This medicine is only for you. Do not share this medicine with others. °What if I miss a dose? °It is important not to miss your dose. Call your doctor or health care professional if you are unable to keep an appointment. °What may interact with this medicine? °This medicine may interact with the following medications: °-other iron products °This list may not describe all possible interactions. Give your health care provider a list of all the medicines, herbs, non-prescription drugs, or dietary supplements you use. Also tell them if you smoke, drink alcohol, or use illegal drugs. Some items may interact with your medicine. °What should I watch for while using this medicine? °Visit your doctor or healthcare professional regularly. Tell your doctor or healthcare professional if your symptoms do not start to get better or if they get worse. You may need blood work done while you are taking this medicine. °You may need to follow a special diet. Talk to your doctor. Foods that contain iron include: whole grains/cereals, dried fruits, beans, or peas, leafy green vegetables, and organ meats (liver, kidney). °What side effects may I notice from receiving this medicine? °Side effects that you should report to your doctor or health care  professional as soon as possible: °-allergic reactions like skin rash, itching or hives, swelling of the face, lips, or tongue °-breathing problems °-changes in blood pressure °-feeling faint or lightheaded, falls °-fever or chills °-flushing, sweating, or hot feelings °-swelling of the ankles or feet °Side effects that usually do not require medical attention (report to your doctor or health care professional if they continue or are bothersome): °-diarrhea °-headache °-nausea, vomiting °-stomach pain °This list may not describe all possible side effects. Call your doctor for medical advice about side effects. You may report side effects to FDA at 1-800-FDA-1088. °Where should I keep my medicine? °This drug is given in a hospital or clinic and will not be stored at home. °NOTE: This sheet is a summary. It may not cover all possible information. If you have questions about this medicine, talk to your doctor, pharmacist, or health care provider. °© 2019 [employer] Standard (2016-10-07 20:21:10) ° °

## 2018-09-24 ENCOUNTER — Ambulatory Visit (HOSPITAL_COMMUNITY): Payer: BLUE CROSS/BLUE SHIELD

## 2018-10-13 DIAGNOSIS — R972 Elevated prostate specific antigen [PSA]: Secondary | ICD-10-CM | POA: Diagnosis not present

## 2018-10-16 ENCOUNTER — Ambulatory Visit (INDEPENDENT_AMBULATORY_CARE_PROVIDER_SITE_OTHER): Payer: BLUE CROSS/BLUE SHIELD | Admitting: Internal Medicine

## 2018-10-16 ENCOUNTER — Other Ambulatory Visit (INDEPENDENT_AMBULATORY_CARE_PROVIDER_SITE_OTHER): Payer: BLUE CROSS/BLUE SHIELD

## 2018-10-16 ENCOUNTER — Encounter: Payer: Self-pay | Admitting: Internal Medicine

## 2018-10-16 VITALS — BP 124/56 | HR 100 | Temp 97.8°F | Ht 70.0 in | Wt 167.0 lb

## 2018-10-16 DIAGNOSIS — J019 Acute sinusitis, unspecified: Secondary | ICD-10-CM | POA: Diagnosis not present

## 2018-10-16 DIAGNOSIS — D5912 Cold autoimmune hemolytic anemia: Secondary | ICD-10-CM

## 2018-10-16 DIAGNOSIS — Z0001 Encounter for general adult medical examination with abnormal findings: Secondary | ICD-10-CM

## 2018-10-16 DIAGNOSIS — R739 Hyperglycemia, unspecified: Secondary | ICD-10-CM

## 2018-10-16 DIAGNOSIS — I712 Thoracic aortic aneurysm, without rupture, unspecified: Secondary | ICD-10-CM

## 2018-10-16 DIAGNOSIS — Z23 Encounter for immunization: Secondary | ICD-10-CM | POA: Diagnosis not present

## 2018-10-16 DIAGNOSIS — R911 Solitary pulmonary nodule: Secondary | ICD-10-CM

## 2018-10-16 DIAGNOSIS — D509 Iron deficiency anemia, unspecified: Secondary | ICD-10-CM

## 2018-10-16 DIAGNOSIS — Z0000 Encounter for general adult medical examination without abnormal findings: Secondary | ICD-10-CM

## 2018-10-16 DIAGNOSIS — D591 Other autoimmune hemolytic anemias: Secondary | ICD-10-CM | POA: Diagnosis not present

## 2018-10-16 DIAGNOSIS — I10 Essential (primary) hypertension: Secondary | ICD-10-CM

## 2018-10-16 DIAGNOSIS — R972 Elevated prostate specific antigen [PSA]: Secondary | ICD-10-CM | POA: Diagnosis not present

## 2018-10-16 LAB — URINALYSIS, ROUTINE W REFLEX MICROSCOPIC
Bilirubin Urine: NEGATIVE
Hgb urine dipstick: NEGATIVE
Ketones, ur: NEGATIVE
Leukocytes,Ua: NEGATIVE
Nitrite: NEGATIVE
RBC / HPF: NONE SEEN (ref 0–?)
Specific Gravity, Urine: 1.01 (ref 1.000–1.030)
TOTAL PROTEIN, URINE-UPE24: NEGATIVE
Urine Glucose: NEGATIVE
Urobilinogen, UA: 1 (ref 0.0–1.0)
pH: 7.5 (ref 5.0–8.0)

## 2018-10-16 LAB — BASIC METABOLIC PANEL
BUN: 22 mg/dL (ref 6–23)
CO2: 31 mEq/L (ref 19–32)
Calcium: 9 mg/dL (ref 8.4–10.5)
Chloride: 98 mEq/L (ref 96–112)
Creatinine, Ser: 1.44 mg/dL (ref 0.40–1.50)
GFR: 59.47 mL/min — ABNORMAL LOW (ref >60.00)
GLUCOSE: 93 mg/dL (ref 70–99)
Potassium: 5 mEq/L (ref 3.5–5.1)
Sodium: 137 mEq/L (ref 135–145)

## 2018-10-16 LAB — HEPATIC FUNCTION PANEL
ALT: 14 U/L (ref 0–53)
AST: 17 U/L (ref 0–37)
Albumin: 3.9 g/dL (ref 3.5–5.2)
Alkaline Phosphatase: 53 U/L (ref 39–117)
Bilirubin, Direct: 0 mg/dL (ref 0.0–0.3)
Total Bilirubin: 0.4 mg/dL (ref 0.2–1.2)
Total Protein: 7.5 g/dL (ref 6.0–8.3)

## 2018-10-16 LAB — LIPID PANEL
Cholesterol: 101 mg/dL (ref 0–200)
HDL: 26.8 mg/dL — ABNORMAL LOW (ref >39.00)
LDL Cholesterol: 57 mg/dL (ref 0–99)
NonHDL: 74.21
Total CHOL/HDL Ratio: 4
Triglycerides: 87 mg/dL (ref 0.0–149.0)
VLDL: 17.4 mg/dL (ref 0.0–40.0)

## 2018-10-16 LAB — IBC PANEL
Iron: 15 ug/dL — ABNORMAL LOW (ref 42–165)
Saturation Ratios: 4.6 % — ABNORMAL LOW (ref 20.0–50.0)
Transferrin: 232 mg/dL (ref 212.0–360.0)

## 2018-10-16 LAB — FERRITIN: Ferritin: 88.5 ng/mL (ref 22.0–322.0)

## 2018-10-16 LAB — HEMOGLOBIN A1C: Hgb A1c MFr Bld: 5.2 % (ref 4.6–6.5)

## 2018-10-16 LAB — TSH: TSH: 1.38 u[IU]/mL (ref 0.35–4.50)

## 2018-10-16 MED ORDER — AZITHROMYCIN 250 MG PO TABS: ORAL_TABLET | ORAL | 1 refills | Status: DC

## 2018-10-16 MED ORDER — HYDROCODONE-HOMATROPINE 5-1.5 MG/5ML PO SYRP: 5.0000 mL | ORAL_SOLUTION | Freq: Four times a day (QID) | ORAL | 0 refills | Status: AC | PRN

## 2018-10-16 MED ORDER — HYDROCODONE-HOMATROPINE 5-1.5 MG/5ML PO SYRP: 5.0000 mL | ORAL_SOLUTION | Freq: Four times a day (QID) | ORAL | 0 refills | Status: DC | PRN

## 2018-10-16 NOTE — Assessment & Plan Note (Signed)
Per pt having PSA every 3 mo with Dr Ottelin/urology

## 2018-10-16 NOTE — Progress Notes (Signed)
Subjective:    Patient ID: Joshua Curtis, male    DOB: 1950-12-25, 68 y.o.   MRN: [id number]  HPI  Here for wellness and f/u;  Overall doing ok;  Pt denies Chest pain, worsening SOB, DOE, wheezing, orthopnea, PND, worsening LE edema, palpitations, dizziness or syncope.  Pt denies neurological change such as new headache, facial or extremity weakness.  Pt denies polydipsia, polyuria, or low sugar symptoms. Pt states overall good compliance with treatment and medications, good tolerability, and has been trying to follow appropriate diet.  Pt denies worsening depressive symptoms, suicidal ideation or panic. No fever, night sweats, wt loss, loss of appetite, or other constitutional symptoms.  Pt states good ability with ADL's, has low fall risk, home safety reviewed and adequate, no other significant changes in hearing or vision, and only occasionally active with exercise. Due for flu shot, prevnar, and f/u imaging at one year (due now) for ascending aorta dilation.  Has been recently followed for iron def anemia Incidentally,  Here with 2-3 days acute onset fever, facial pain, pressure, headache, general weakness and malaise, and greenish d/c, with mild ST and cough.   Past Medical History:  Diagnosis Date  . Anemia, iron deficiency 04/02/2016  . BPH (benign prostatic hyperplasia) 04/10/2017  . COLONIC POLYPS, HX OF 11/24/2007  . DIVERTICULOSIS, COLON 11/24/2007  . ED (erectile dysfunction)   . HYPERTENSION 11/24/2007  . Increased prostate specific antigen (PSA) velocity 06/22/2011   Past Surgical History:  Procedure Laterality Date  . NO PAST SURGERIES      reports that he has been smoking. He has never used smokeless tobacco. He reports current alcohol use. He reports that he does not use drugs. family history includes Anuerysm in his brother; Diabetes in his father and paternal grandfather; Hypertension in his sister; Kidney failure in his brother; Stroke in his mother. No Known  Allergies Current Outpatient Medications on File Prior to Visit  Medication Sig Dispense Refill  . amLODipine-benazepril (LOTREL) 10-40 MG capsule TAKE 1 CAPSULE BY MOUTH  DAILY 90 capsule 3  . aspirin 81 MG tablet Take 81 mg by mouth daily.      . ferrous sulfate 325 (65 FE) MG tablet Take 1 tablet (325 mg total) by mouth 2 (two) times daily with a meal. 180 tablet 1  . hydrochlorothiazide (MICROZIDE) 12.5 MG capsule TAKE 1 CAPSULE BY MOUTH  DAILY 90 capsule 3  . omeprazole (PRILOSEC) 40 MG capsule Take 1 capsule (40 mg total) by mouth 2 (two) times daily. 180 capsule 1   No current facility-administered medications on file prior to visit.    Review of Systems Constitutional: Negative for other unusual diaphoresis, sweats, appetite or weight changes HENT: Negative for other worsening hearing loss, ear pain, facial swelling, mouth sores or neck stiffness.   Eyes: Negative for other worsening pain, redness or other visual disturbance.  Respiratory: Negative for other stridor or swelling Cardiovascular: Negative for other palpitations or other chest pain  Gastrointestinal: Negative for worsening diarrhea or loose stools, blood in stool, distention or other pain Genitourinary: Negative for hematuria, flank pain or other change in urine volume.  Musculoskeletal: Negative for myalgias or other joint swelling.  Skin: Negative for other color change, or other wound or worsening drainage.  Neurological: Negative for other syncope or numbness. Hematological: Negative for other adenopathy or swelling Psychiatric/Behavioral: Negative for hallucinations, other worsening agitation, SI, self-injury, or new decreased concentration All other system neg per pt    Objective:  Physical Exam BP (!) 124/56 (BP Location: Left Arm, Patient Position: Sitting, Cuff Size: Large)   Pulse 100   Temp 97.8 F (36.6 C) (Oral)   Ht 5\' 10"  (1.778 m)   Wt 167 lb (75.8 kg)   SpO2 94%   BMI 23.96 kg/m  VS noted,  mild ill Constitutional: Pt is oriented to person, place, and time. Appears well-developed and well-nourished, in no significant distress and comfortable Head: Normocephalic and atraumatic  Eyes: Conjunctivae and EOM are normal. Pupils are equal, round, and reactive to light Right Ear: External ear normal without discharge Left Ear: External ear normal without discharge Nose: Nose without discharge or deformity Bilat tm's with mild erythema.  Max sinus areas mild tender.  Pharynx with mild erythema, no exudate Mouth/Throat: Oropharynx is without other ulcerations and moist  Neck: Normal range of motion. Neck supple. No JVD present. No tracheal deviation present or significant neck LA or mass Cardiovascular: Normal rate, regular rhythm, normal heart sounds and intact distal pulses  Pulmonary/Chest: WOB normal and breath sounds without rales or wheezing  Abdominal: Soft. Bowel sounds are normal. NT. No HSM  Musculoskeletal: Normal range of motion. Exhibits no edema Lymphadenopathy: Has no other cervical adenopathy.  Neurological: Pt is alert and oriented to person, place, and time. Pt has normal reflexes. No cranial nerve deficit. Motor grossly intact, Gait intact Skin: Skin is warm and dry. No rash noted or new ulcerations Psychiatric:  Has normal mood and affect. Behavior is normal without agitation No other exam findings Lab Results  Component Value Date   WBC 6.0 10/16/2018   HGB 13.4 10/16/2018   HCT 41.0 10/16/2018   PLT 303 10/16/2018   GLUCOSE 93 10/16/2018   CHOL 101 10/16/2018   TRIG 87.0 10/16/2018   HDL 26.80 (L) 10/16/2018   LDLDIRECT 85.0 10/03/2016   LDLCALC 57 10/16/2018   ALT 14 10/16/2018   AST 17 10/16/2018   NA 137 10/16/2018   K 5.0 10/16/2018   CL 98 10/16/2018   CREATININE 1.44 10/16/2018   BUN 22 10/16/2018   CO2 31 10/16/2018   TSH 1.38 10/16/2018   PSA 7.31 (H) 10/03/2016   HGBA1C 5.2 10/16/2018          Assessment & Plan:

## 2018-10-16 NOTE — Assessment & Plan Note (Signed)
Still smoking, for assessment in conjuntion with CTA for aorta

## 2018-10-16 NOTE — Patient Instructions (Addendum)
You had the flu shot AND the Prevnar 13 pneumonia shot today  Please take all new medication as prescribed - the antibiotic, and cough medicine as needed  Please stop smoking  Please continue all other medications as before, and refills have been done if requested.  Please have the pharmacy call with any other refills you may need.  Please continue your efforts at being more active, low cholesterol diet, and weight control.  You are otherwise up to date with prevention measures today.  Please keep your appointments with your specialists as you may have planned  You will be contacted regarding the referral for: CTA (Ct scan) for the chest and aorta and lung nodules  Please go to the LAB in the Basement (turn left off the elevator) for the tests to be done today  You will be contacted by phone if any changes need to be made immediately.  Otherwise, you will receive a letter about your results with an explanation, but please check with MyChart first.  Please remember to sign up for MyChart if you have not done so, as this will be important to you in the future with finding out test results, communicating by private email, and scheduling acute appointments online when needed.  Please return in 6 months, or sooner if needed, with Lab testing done 3-5 days before

## 2018-10-16 NOTE — Assessment & Plan Note (Signed)
For yearly CTA f/u exam

## 2018-10-17 LAB — CBC WITH DIFFERENTIAL/PLATELET
Absolute Monocytes: 774 cells/uL (ref 200–950)
BASOS ABS: 72 {cells}/uL (ref 0–200)
Basophils Relative: 1.2 %
Eosinophils Absolute: 270 cells/uL (ref 15–500)
Eosinophils Relative: 4.5 %
HCT: 41 % (ref 38.5–50.0)
Hemoglobin: 13.4 g/dL (ref 13.2–17.1)
Lymphs Abs: 1530 cells/uL (ref 850–3900)
MCH: 27.3 pg (ref 27.0–33.0)
MCHC: 32.7 g/dL (ref 32.0–36.0)
MCV: 83.5 fL (ref 80.0–100.0)
MPV: 10.3 fL (ref 7.5–12.5)
Monocytes Relative: 12.9 %
Neutro Abs: 3354 cells/uL (ref 1500–7800)
Neutrophils Relative %: 55.9 %
PLATELETS: 303 10*3/uL (ref 140–400)
RBC: 4.91 10*6/uL (ref 4.20–5.80)
RDW: 25.2 % — ABNORMAL HIGH (ref 11.0–15.0)
Total Lymphocyte: 25.5 %
WBC: 6 10*3/uL (ref 3.8–10.8)

## 2018-10-17 LAB — CBC MORPHOLOGY

## 2018-10-17 NOTE — Assessment & Plan Note (Addendum)
Mild to mod, for antibx course,  to f/u any worsening symptoms or concerns  In addition to the time spent performing CPE, I spent an additional 25 minutes face to face,in which greater than 50% of this time was spent in counseling and coordination of care for patient's acute illness as documented, including the differential dx, treatment, further evaluation and other management of acute sinus infection, thoracic aortic aneurysm, pulm nodule, elevated PSA, HTN, hyperglycemia, and iron defieciency anemia

## 2018-10-17 NOTE — Assessment & Plan Note (Signed)
stable overall by history and exam, recent data reviewed with pt, and pt to continue medical treatment as before,  to f/u any worsening symptoms or concerns  

## 2018-10-17 NOTE — Assessment & Plan Note (Addendum)
No overt bleeding, for f/u lab, f/u GI

## 2018-10-17 NOTE — Assessment & Plan Note (Signed)

## 2018-11-01 ENCOUNTER — Other Ambulatory Visit: Payer: Self-pay | Admitting: Internal Medicine

## 2018-11-11 ENCOUNTER — Other Ambulatory Visit: Payer: Self-pay

## 2018-11-11 ENCOUNTER — Ambulatory Visit (INDEPENDENT_AMBULATORY_CARE_PROVIDER_SITE_OTHER)
Admission: RE | Admit: 2018-11-11 | Discharge: 2018-11-11 | Disposition: A | Discharge status: Home / Self Care | Payer: BLUE CROSS/BLUE SHIELD | Source: Ambulatory Visit | Attending: Internal Medicine | Admitting: Internal Medicine

## 2018-11-11 DIAGNOSIS — I712 Thoracic aortic aneurysm, without rupture, unspecified: Secondary | ICD-10-CM

## 2018-11-11 MED ORDER — IOPAMIDOL (ISOVUE-370) INJECTION 76%
75.0000 mL | Freq: Once | INTRAVENOUS | Status: AC | PRN
  Administered 2018-11-11: 75 mL via INTRAVENOUS

## 2018-11-12 ENCOUNTER — Encounter: Payer: Self-pay | Admitting: Internal Medicine

## 2018-11-12 DIAGNOSIS — I2721 Secondary pulmonary arterial hypertension: Secondary | ICD-10-CM | POA: Insufficient documentation

## 2018-11-12 DIAGNOSIS — I2510 Atherosclerotic heart disease of native coronary artery without angina pectoris: Secondary | ICD-10-CM

## 2018-11-12 DIAGNOSIS — I700 Atherosclerosis of aorta: Secondary | ICD-10-CM

## 2019-01-05 DIAGNOSIS — N400 Enlarged prostate without lower urinary tract symptoms: Secondary | ICD-10-CM | POA: Diagnosis not present

## 2019-01-12 ENCOUNTER — Other Ambulatory Visit: Payer: Self-pay | Admitting: Urology

## 2019-01-12 DIAGNOSIS — N400 Enlarged prostate without lower urinary tract symptoms: Secondary | ICD-10-CM | POA: Diagnosis not present

## 2019-01-12 DIAGNOSIS — R972 Elevated prostate specific antigen [PSA]: Secondary | ICD-10-CM

## 2019-02-16 ENCOUNTER — Ambulatory Visit
Admission: RE | Admit: 2019-02-16 | Discharge: 2019-02-16 | Disposition: A | Discharge status: Home / Self Care | Payer: BC Managed Care – PPO | Source: Ambulatory Visit | Attending: Urology | Admitting: Urology

## 2019-02-16 ENCOUNTER — Other Ambulatory Visit: Payer: Self-pay

## 2019-02-16 DIAGNOSIS — R972 Elevated prostate specific antigen [PSA]: Secondary | ICD-10-CM | POA: Diagnosis not present

## 2019-02-16 MED ORDER — GADOBENATE DIMEGLUMINE 529 MG/ML IV SOLN
15.0000 mL | Freq: Once | INTRAVENOUS | Status: AC | PRN
  Administered 2019-02-16: 15 mL via INTRAVENOUS

## 2019-04-05 DIAGNOSIS — N400 Enlarged prostate without lower urinary tract symptoms: Secondary | ICD-10-CM | POA: Diagnosis not present

## 2019-04-21 ENCOUNTER — Other Ambulatory Visit: Payer: Self-pay

## 2019-04-21 ENCOUNTER — Ambulatory Visit (INDEPENDENT_AMBULATORY_CARE_PROVIDER_SITE_OTHER): Payer: BC Managed Care – PPO | Admitting: Internal Medicine

## 2019-04-21 ENCOUNTER — Other Ambulatory Visit: Payer: Self-pay | Admitting: Internal Medicine

## 2019-04-21 ENCOUNTER — Encounter: Payer: Self-pay | Admitting: Internal Medicine

## 2019-04-21 ENCOUNTER — Other Ambulatory Visit (INDEPENDENT_AMBULATORY_CARE_PROVIDER_SITE_OTHER): Payer: BC Managed Care – PPO

## 2019-04-21 VITALS — BP 118/76 | HR 97 | Temp 98.1°F | Ht 70.0 in | Wt 167.0 lb

## 2019-04-21 DIAGNOSIS — R739 Hyperglycemia, unspecified: Secondary | ICD-10-CM

## 2019-04-21 DIAGNOSIS — M25561 Pain in right knee: Secondary | ICD-10-CM | POA: Diagnosis not present

## 2019-04-21 DIAGNOSIS — E538 Deficiency of other specified B group vitamins: Secondary | ICD-10-CM

## 2019-04-21 DIAGNOSIS — I10 Essential (primary) hypertension: Secondary | ICD-10-CM

## 2019-04-21 DIAGNOSIS — Z23 Encounter for immunization: Secondary | ICD-10-CM

## 2019-04-21 DIAGNOSIS — E559 Vitamin D deficiency, unspecified: Secondary | ICD-10-CM

## 2019-04-21 DIAGNOSIS — D509 Iron deficiency anemia, unspecified: Secondary | ICD-10-CM

## 2019-04-21 LAB — BASIC METABOLIC PANEL
BUN: 18 mg/dL (ref 6–23)
CO2: 30 mEq/L (ref 19–32)
Calcium: 9 mg/dL (ref 8.4–10.5)
Chloride: 101 mEq/L (ref 96–112)
Creatinine, Ser: 1.4 mg/dL (ref 0.40–1.50)
GFR: 61.34 mL/min (ref >60.00)
Glucose, Bld: 103 mg/dL — ABNORMAL HIGH (ref 70–99)
Potassium: 3.5 mEq/L (ref 3.5–5.1)
Sodium: 139 mEq/L (ref 135–145)

## 2019-04-21 LAB — HEPATIC FUNCTION PANEL
ALT: 10 U/L (ref 0–53)
AST: 20 U/L (ref 0–37)
Albumin: 4.1 g/dL (ref 3.5–5.2)
Alkaline Phosphatase: 66 U/L (ref 39–117)
Bilirubin, Direct: 0.1 mg/dL (ref 0.0–0.3)
Total Bilirubin: 0.4 mg/dL (ref 0.2–1.2)
Total Protein: 7.1 g/dL (ref 6.0–8.3)

## 2019-04-21 LAB — CBC WITH DIFFERENTIAL/PLATELET
Basophils Absolute: 0.1 10*3/uL (ref 0.0–0.1)
Basophils Relative: 2.1 % (ref 0.0–3.0)
Eosinophils Absolute: 0.6 10*3/uL (ref 0.0–0.7)
Eosinophils Relative: 9.4 % — ABNORMAL HIGH (ref 0.0–5.0)
HCT: 45 % (ref 39.0–52.0)
Hemoglobin: 15.1 g/dL (ref 13.0–17.0)
Lymphocytes Relative: 25.1 % (ref 12.0–46.0)
Lymphs Abs: 1.5 10*3/uL (ref 0.7–4.0)
MCHC: 33.6 g/dL (ref 30.0–36.0)
MCV: 100.1 fl — ABNORMAL HIGH (ref 78.0–100.0)
Monocytes Absolute: 0.6 10*3/uL (ref 0.1–1.0)
Monocytes Relative: 9.5 % (ref 3.0–12.0)
Neutro Abs: 3.2 10*3/uL (ref 1.4–7.7)
Neutrophils Relative %: 53.9 % (ref 43.0–77.0)
Platelets: 263 10*3/uL (ref 150.0–400.0)
RBC: 4.49 Mil/uL (ref 4.22–5.81)
RDW: 15 % (ref 11.5–15.5)
WBC: 6 10*3/uL (ref 4.0–10.5)

## 2019-04-21 LAB — LIPID PANEL
Cholesterol: 128 mg/dL (ref 0–200)
HDL: 34.3 mg/dL — ABNORMAL LOW (ref >39.00)
NonHDL: 93.83
Total CHOL/HDL Ratio: 4
Triglycerides: 215 mg/dL — ABNORMAL HIGH (ref 0.0–149.0)
VLDL: 43 mg/dL — ABNORMAL HIGH (ref 0.0–40.0)

## 2019-04-21 LAB — VITAMIN B12: Vitamin B-12: 365 pg/mL (ref 211–911)

## 2019-04-21 LAB — HEMOGLOBIN A1C: Hgb A1c MFr Bld: 5.6 % (ref 4.6–6.5)

## 2019-04-21 LAB — LDL CHOLESTEROL, DIRECT: Direct LDL: 74 mg/dL

## 2019-04-21 LAB — IBC PANEL
Iron: 136 ug/dL (ref 42–165)
Saturation Ratios: 33.5 % (ref 20.0–50.0)
Transferrin: 290 mg/dL (ref 212.0–360.0)

## 2019-04-21 LAB — FERRITIN: Ferritin: 21.6 ng/mL — ABNORMAL LOW (ref 22.0–322.0)

## 2019-04-21 LAB — VITAMIN D 25 HYDROXY (VIT D DEFICIENCY, FRACTURES): VITD: 13.7 ng/mL — ABNORMAL LOW (ref 30.00–100.00)

## 2019-04-21 MED ORDER — FERROUS SULFATE 325 (65 FE) MG PO TABS: 325.0000 mg | ORAL_TABLET | Freq: Every day | ORAL | 0 refills | Status: DC

## 2019-04-21 MED ORDER — VITAMIN D (ERGOCALCIFEROL) 1.25 MG (50000 UNIT) PO CAPS: 50000.0000 [IU] | ORAL_CAPSULE | ORAL | 0 refills | Status: DC

## 2019-04-21 MED ORDER — MELOXICAM 15 MG PO TABS: 15.0000 mg | ORAL_TABLET | Freq: Every day | ORAL | 1 refills | Status: DC | PRN

## 2019-04-21 NOTE — Assessment & Plan Note (Signed)
stable overall by history and exam, recent data reviewed with pt, and pt to continue medical treatment as before,  to f/u any worsening symptoms or concerns  

## 2019-04-21 NOTE — Assessment & Plan Note (Signed)
No recent overt bleeding, for iron and cbc today,  to f/u any worsening symptoms or concerns

## 2019-04-21 NOTE — Assessment & Plan Note (Signed)
C/w DJD with effusion, moderate, for mobic prn, refer sports med for likely cortisone asd,  to f/u any worsening symptoms or concerns

## 2019-04-21 NOTE — Patient Instructions (Signed)
.  You had the flu shot today  Please take all new medication as prescribed - the meloxicam for pain and swelling  You will be contacted regarding the referral for: Sports Medicine in this office (Dr Tamala Julian)  Please continue all other medications as before, and refills have been done if requested.  Please have the pharmacy call with any other refills you may need.  Please continue your efforts at being more active, low cholesterol diet, and weight control.  You are otherwise up to date with prevention measures today.  Please keep your appointments with your specialists as you may have planned  Please go to the LAB in the Basement (turn left off the elevator) for the tests to be done today  You will be contacted by phone if any changes need to be made immediately.  Otherwise, you will receive a letter about your results with an explanation, but please check with MyChart first.  Please remember to sign up for MyChart if you have not done so, as this will be important to you in the future with finding out test results, communicating by private email, and scheduling acute appointments online when needed.  Please return in 6 months, or sooner if needed, with Lab testing done 3-5 days before

## 2019-04-21 NOTE — Progress Notes (Signed)
Subjective:    Patient ID: Joshua Curtis, male    DOB: 1951/05/01, 68 y.o.   MRN: [id number]  HPI  Here to f/u; overall doing ok,  Pt denies chest pain, increasing sob or doe, wheezing, orthopnea, PND, increased LE swelling, palpitations, dizziness or syncope.  Pt denies new neurological symptoms such as new headache, or facial or extremity weakness or numbness.  Pt denies polydipsia, polyuria, or low sugar episode.  Pt states overall good compliance with meds, mostly trying to follow appropriate diet, with wt overall stable,  but little exercise however.  No overt bleeding.  Due for flu shot.  Also with right knee pain, mild to mod but worse currently after tried to run, is swelling and worse to walk, better to sit, has not had prior tx for DJD.   Past Medical History:  Diagnosis Date  . Anemia, iron deficiency 04/02/2016  . Aortic atherosclerosis ([place])   . BPH (benign prostatic hyperplasia) 04/10/2017  . COLONIC POLYPS, HX OF 11/24/2007  . COPD (chronic obstructive pulmonary disease) ([place])   . Coronary artery calcification seen on CAT scan   . DIVERTICULOSIS, COLON 11/24/2007  . ED (erectile dysfunction)   . HYPERTENSION 11/24/2007  . Increased prostate specific antigen (PSA) velocity 06/22/2011  . PAH (pulmonary artery hypertension) ([place])    Past Surgical History:  Procedure Laterality Date  . NO PAST SURGERIES      reports that he has been smoking. He has never used smokeless tobacco. He reports current alcohol use. He reports that he does not use drugs. family history includes Anuerysm in his brother; Diabetes in his father and paternal grandfather; Hypertension in his sister; Kidney failure in his brother; Stroke in his mother. No Known Allergies Current Outpatient Medications on File Prior to Visit  Medication Sig Dispense Refill  . amLODipine-benazepril (LOTREL) 10-40 MG capsule TAKE 1 CAPSULE BY MOUTH  DAILY 90 capsule 3  . aspirin 81 MG tablet Take 81 mg by mouth daily.      .  ferrous sulfate 325 (65 FE) MG tablet Take 1 tablet (325 mg total) by mouth 2 (two) times daily with a meal. 180 tablet 1  . hydrochlorothiazide (MICROZIDE) 12.5 MG capsule TAKE 1 CAPSULE BY MOUTH  DAILY 90 capsule 3  . omeprazole (PRILOSEC) 40 MG capsule Take 1 capsule (40 mg total) by mouth 2 (two) times daily. 180 capsule 1   No current facility-administered medications on file prior to visit.    Review of Systems  Constitutional: Negative for other unusual diaphoresis or sweats HENT: Negative for ear discharge or swelling Eyes: Negative for other worsening visual disturbances Respiratory: Negative for stridor or other swelling  Gastrointestinal: Negative for worsening distension or other blood Genitourinary: Negative for retention or other urinary change Musculoskeletal: Negative for other MSK pain or swelling Skin: Negative for color change or other new lesions Neurological: Negative for worsening tremors and other numbness  Psychiatric/Behavioral: Negative for worsening agitation or other fatigue All other system neg per pt    Objective:   Physical Exam BP 118/76   Pulse 97   Temp 98.1 F (36.7 C) (Oral)   Ht 5\' 10"  (1.778 m)   Wt 167 lb (75.8 kg)   SpO2 96%   BMI 23.96 kg/m  VS noted,  Constitutional: Pt appears in NAD HENT: Head: NCAT.  Right Ear: External ear normal.  Left Ear: External ear normal.  Eyes: . Pupils are equal, round, and reactive to light. Conjunctivae and EOM are  normal Nose: without d/c or deformity Neck: Neck supple. Gross normal ROM Cardiovascular: Normal rate and regular rhythm.   Pulmonary/Chest: Effort normal and breath sounds without rales or wheezing.  Abd:  Soft, NT, ND, + BS, no organomegaly Neurological: Pt is alert. At baseline orientation, motor grossly intact Right knee with degenerative changes and 1-2+ effusion, + crepitus Skin: Skin is warm. No rashes, other new lesions, no LE edema Psychiatric: Pt behavior is normal without  agitation  No other exam findings Lab Results  Component Value Date   WBC 6.0 10/16/2018   HGB 13.4 10/16/2018   HCT 41.0 10/16/2018   PLT 303 10/16/2018   GLUCOSE 93 10/16/2018   CHOL 101 10/16/2018   TRIG 87.0 10/16/2018   HDL 26.80 (L) 10/16/2018   LDLDIRECT 85.0 10/03/2016   LDLCALC 57 10/16/2018   ALT 14 10/16/2018   AST 17 10/16/2018   NA 137 10/16/2018   K 5.0 10/16/2018   CL 98 10/16/2018   CREATININE 1.44 10/16/2018   BUN 22 10/16/2018   CO2 31 10/16/2018   TSH 1.38 10/16/2018   PSA 7.31 (H) 10/03/2016   HGBA1C 5.2 10/16/2018        Assessment & Plan:

## 2019-04-26 ENCOUNTER — Other Ambulatory Visit: Payer: Self-pay | Admitting: Gastroenterology

## 2019-05-23 NOTE — Progress Notes (Signed)
[facility] [place], [place] [address] Phone: [phone] Subjective:   Joshua Curtis, am serving as a scribe for Dr. Hulan Saas.  I'm seeing this patient by the request  of:  Biagio Borg, MD   CC: Leg pain  [id number]  Joshua Curtis is a 69 y.o. male coming in with complaint of  Right knee pain on medial aspect. Pain is intermittent. Is using meloxicam. Pain increases with walking. Denies any weakness or radiating symptoms. Occasionally has calf pain. Does not use ice or heat. Has tried [employer].    Most recent labs were found to have low vitamin D as well as iron  Past Medical History:  Diagnosis Date  . Anemia, iron deficiency 04/02/2016  . Aortic atherosclerosis ([place])   . BPH (benign prostatic hyperplasia) 04/10/2017  . COLONIC POLYPS, HX OF 11/24/2007  . COPD (chronic obstructive pulmonary disease) ([place])   . Coronary artery calcification seen on CAT scan   . DIVERTICULOSIS, COLON 11/24/2007  . ED (erectile dysfunction)   . HYPERTENSION 11/24/2007  . Increased prostate specific antigen (PSA) velocity 06/22/2011  . PAH (pulmonary artery hypertension) ([place])    Past Surgical History:  Procedure Laterality Date  . Curtis PAST SURGERIES     Social History   Socioeconomic History  . Marital status: Married    Spouse name: Not on file  . Number of children: 2  . Years of education: Not on file  . Highest education level: Not on file  Occupational History  . Not on file  Social Needs  . Financial resource strain: Not on file  . Food insecurity    Worry: Not on file    Inability: Not on file  . Transportation needs    Medical: Not on file    Non-medical: Not on file  Tobacco Use  . Smoking status: Current Every Day Smoker  . Smokeless tobacco: Never Used  Substance and Sexual Activity  . Alcohol use: Yes    Comment: occasional beer  . Drug use: Curtis  . Sexual activity: Not on file  Lifestyle  . Physical activity   Days per week: Not on file    Minutes per session: Not on file  . Stress: Not on file  Relationships  . Social Herbalist on phone: Not on file    Gets together: Not on file    Attends religious service: Not on file    Active member of club or organization: Not on file    Attends meetings of clubs or organizations: Not on file    Relationship status: Not on file  Other Topics Concern  . Not on file  Social History Narrative  . Not on file   Curtis Known Allergies Family History  Problem Relation Age of Onset  . Stroke Mother   . Diabetes Father   . Hypertension Sister   . Kidney failure Brother        dialysis  . Diabetes Paternal Grandfather   . Anuerysm Brother      Current Outpatient Medications (Cardiovascular):  .  amLODipine-benazepril (LOTREL) 10-40 MG capsule, TAKE 1 CAPSULE BY MOUTH  DAILY .  hydrochlorothiazide (MICROZIDE) 12.5 MG capsule, TAKE 1 CAPSULE BY MOUTH  DAILY   Current Outpatient Medications (Analgesics):  .  aspirin 81 MG tablet, Take 81 mg by mouth daily.   .  meloxicam (MOBIC) 15 MG tablet, Take 1 tablet (15 mg total) by mouth  daily as needed for pain.  Current Outpatient Medications (Hematological):  .  ferrous sulfate 325 (65 FE) MG tablet, Take 1 tablet (325 mg total) by mouth daily with breakfast.  Current Outpatient Medications (Other):  .  omeprazole (PRILOSEC) 40 MG capsule, Take 1 capsule (40 mg total) by mouth 2 (two) times daily. Please schedule a follow up visit with Dr. Havery Moros before the end of the year for further refills. .  Vitamin D, Ergocalciferol, (DRISDOL) 1.25 MG (50000 UT) CAPS capsule, Take 1 capsule (50,000 Units total) by mouth every 7 (seven) days.    Past medical history, social, surgical and family history all reviewed in electronic medical record.  Curtis pertanent information unless stated regarding to the chief complaint.   Review of Systems:  Curtis headache, visual changes, nausea, vomiting, diarrhea,  constipation, dizziness, abdominal pain, skin rash, fevers, chills, night sweats, weight loss, swollen lymph nodes, body aches, joint swelling, muscle aches, chest pain, shortness of breath, mood changes.   Objective  Blood pressure (!) 170/82, pulse (!) 50, height 5\' 10"  (1.778 m), weight 168 lb (76.2 kg), SpO2 97 %.    General: Curtis apparent distress alert and oriented x3 mood and affect normal, dressed appropriately.  HEENT: Pupils equal, extraocular movements intact  Respiratory: Patient's speak in full sentences and does not appear short of breath  Cardiovascular: Curtis lower extremity edema, non tender, Curtis erythema  Skin: Warm dry intact with Curtis signs of infection or rash on extremities or on axial skeleton.  Abdomen: Soft nontender  Neuro: Cranial nerves II through XII are intact, neurovascularly intact in all extremities with 2+ DTRs and 2+ pulses.  Lymph: Curtis lymphadenopathy of posterior or anterior cervical chain or axillae bilaterally.  Gait normal with good balance and coordination.  MSK:  Non tender with full range of motion and good stability and symmetric strength and tone of shoulders, elbows, wrist, hip and ankles bilaterally.  Right knee exam shows the patient does have some arthritic changes with very mild varus deformity noted.  Tender to palpation more over the medial joint line.  Crepitus with pain patellofemoral.  Mild lateral tracking of the patella noted as well.  Curtis fullness noted of the popliteal area.  Negative squeeze test of the calf.  Limited musculoskeletal ultrasound was performed and interpreted by Lyndal Pulley  Limited ultrasound shows the patient does have an effusion noted to the patellofemoral joint with mild to moderate arthritic changes.  Moderate to severe narrowing of the medial joint space with degenerative meniscal tearing noted medially and laterally but Curtis significant displacement. Impression: Right knee arthritis     Impression and Recommendations:      This case required medical decision making of moderate complexity. The above documentation has been reviewed and is accurate and complete Lyndal Pulley, DO       Note: This dictation was prepared with Dragon dictation along with smaller phrase technology. Any transcriptional errors that result from this process are unintentional.

## 2019-05-24 ENCOUNTER — Ambulatory Visit (INDEPENDENT_AMBULATORY_CARE_PROVIDER_SITE_OTHER)
Admission: RE | Admit: 2019-05-24 | Discharge: 2019-05-24 | Disposition: A | Discharge status: Home / Self Care | Payer: BC Managed Care – PPO | Source: Ambulatory Visit | Attending: Family Medicine | Admitting: Family Medicine

## 2019-05-24 ENCOUNTER — Ambulatory Visit (INDEPENDENT_AMBULATORY_CARE_PROVIDER_SITE_OTHER): Payer: BC Managed Care – PPO | Admitting: Family Medicine

## 2019-05-24 ENCOUNTER — Other Ambulatory Visit: Payer: Self-pay

## 2019-05-24 ENCOUNTER — Encounter: Payer: Self-pay | Admitting: Family Medicine

## 2019-05-24 VITALS — BP 170/82 | HR 50 | Ht 70.0 in | Wt 168.0 lb

## 2019-05-24 DIAGNOSIS — G8929 Other chronic pain: Secondary | ICD-10-CM | POA: Diagnosis not present

## 2019-05-24 DIAGNOSIS — M25561 Pain in right knee: Secondary | ICD-10-CM

## 2019-05-24 NOTE — Assessment & Plan Note (Signed)
Moderate arthritic changes.  We discussed with patient at length about icing regimen, continue once weekly vitamin D discussed icing regimen.  Also discussed over-the-counter medications.  I do not feel any true instability.  Patient has had intermittent calf pain and no significant discomfort today though.  We did discuss the possibility of ruling out any type of clot with Doppler palpation of spine.  Patient will try Aleve modalities and come back again in 4 weeks.  Worsening pain consider injection

## 2019-05-24 NOTE — Patient Instructions (Signed)
Good to see you.  Ice 20 minutes 2 times daily. Usually after activity and before bed. Exercises 3 times a week.  Voltaren gel 2x a day as needed Tart cherry extract 1200mg  at night Vitamin D 2000 IU daily  See me again in 4 weeks, if not better we will inject

## 2019-06-23 ENCOUNTER — Ambulatory Visit (INDEPENDENT_AMBULATORY_CARE_PROVIDER_SITE_OTHER): Payer: BC Managed Care – PPO | Admitting: Family Medicine

## 2019-06-23 ENCOUNTER — Other Ambulatory Visit: Payer: Self-pay

## 2019-06-23 ENCOUNTER — Encounter: Payer: Self-pay | Admitting: Family Medicine

## 2019-06-23 DIAGNOSIS — M25561 Pain in right knee: Secondary | ICD-10-CM

## 2019-06-23 NOTE — Progress Notes (Signed)
[facility] [place] [place], [place] [address] Phone: [phone] Subjective:     CC: Right knee pain follow-up  [id number]    05/24/19: Moderate arthritic changes.  We discussed with patient at length about icing regimen, continue once weekly vitamin D discussed icing regimen.  Also discussed over-the-counter medications.  I do not feel any true instability.  Patient has had intermittent calf pain and no significant discomfort today though.  We did discuss the possibility of ruling out any type of clot with Doppler palpation of spine.  Patient will try Aleve modalities and come back again in 4 weeks.  Worsening pain consider injection  Update- 06/23/19 Joshua Curtis is a 68 y.o. male coming in with complaint of R knee and leg.  Pt states that his knee pain has improved since his last visit, especially over the past 2 weeks which has been relatively pain-free.  He notes intermittent popping in his R knee.  He states that he couldn't find the Voltaren gel and also couldn't find the vit D and tart cherry extract.       Past Medical History:  Diagnosis Date  . Anemia, iron deficiency 04/02/2016  . Aortic atherosclerosis ([facility])   . BPH (benign prostatic hyperplasia) 04/10/2017  . COLONIC POLYPS, HX OF 11/24/2007  . COPD (chronic obstructive pulmonary disease) ([facility])   . Coronary artery calcification seen on CAT scan   . DIVERTICULOSIS, COLON 11/24/2007  . ED (erectile dysfunction)   . HYPERTENSION 11/24/2007  . Increased prostate specific antigen (PSA) velocity 06/22/2011  . PAH (pulmonary artery hypertension) ([facility])    Past Surgical History:  Procedure Laterality Date  . NO PAST SURGERIES     Social History   Socioeconomic History  . Marital status: Married    Spouse name: Not on file  . Number of children: 2  . Years of education: Not on file  . Highest education level: Not on file  Occupational History  . Not on file  Social Needs  .  Financial resource strain: Not on file  . Food insecurity    Worry: Not on file    Inability: Not on file  . Transportation needs    Medical: Not on file    Non-medical: Not on file  Tobacco Use  . Smoking status: Current Every Day Smoker  . Smokeless tobacco: Never Used  Substance and Sexual Activity  . Alcohol use: Yes    Comment: occasional beer  . Drug use: No  . Sexual activity: Not on file  Lifestyle  . Physical activity    Days per week: Not on file    Minutes per session: Not on file  . Stress: Not on file  Relationships  . Social Herbalist on phone: Not on file    Gets together: Not on file    Attends religious service: Not on file    Active member of club or organization: Not on file    Attends meetings of clubs or organizations: Not on file    Relationship status: Not on file  Other Topics Concern  . Not on file  Social History Narrative  . Not on file   No Known Allergies Family History  Problem Relation Age of Onset  . Stroke Mother   . Diabetes Father   . Hypertension Sister   . Kidney failure Brother        dialysis  . Diabetes Paternal Grandfather   . Anuerysm  Brother      Current Outpatient Medications (Cardiovascular):  .  amLODipine-benazepril (LOTREL) 10-40 MG capsule, TAKE 1 CAPSULE BY MOUTH  DAILY .  hydrochlorothiazide (MICROZIDE) 12.5 MG capsule, TAKE 1 CAPSULE BY MOUTH  DAILY   Current Outpatient Medications (Analgesics):  .  aspirin 81 MG tablet, Take 81 mg by mouth daily.   .  meloxicam (MOBIC) 15 MG tablet, Take 1 tablet (15 mg total) by mouth daily as needed for pain.  Current Outpatient Medications (Hematological):  .  ferrous sulfate 325 (65 FE) MG tablet, Take 1 tablet (325 mg total) by mouth daily with breakfast.  Current Outpatient Medications (Other):  .  omeprazole (PRILOSEC) 40 MG capsule, Take 1 capsule (40 mg total) by mouth 2 (two) times daily. Please schedule a follow up visit with Dr. Havery Moros before the  end of the year for further refills. .  Vitamin D, Ergocalciferol, (DRISDOL) 1.25 MG (50000 UT) CAPS capsule, Take 1 capsule (50,000 Units total) by mouth every 7 (seven) days.    Past medical history, social, surgical and family history all reviewed in electronic medical record.  No pertanent information unless stated regarding to the chief complaint.   Review of Systems:  No headache, visual changes, nausea, vomiting, diarrhea, constipation, dizziness, abdominal pain, skin rash, fevers, chills, night sweats, weight loss, swollen lymph nodes, body aches, joint swelling, muscle aches, chest pain, shortness of breath, mood changes.   Objective  Blood pressure 140/60, pulse 98, height 5\' 10"  (1.778 m), weight 166 lb (75.3 kg), SpO2 96 %.    General: No apparent distress alert and oriented x3 mood and affect normal, dressed appropriately.  HEENT: Pupils equal, extraocular movements intact  Respiratory: Patient's speak in full sentences and does not appear short of breath  Cardiovascular: No lower extremity edema, non tender, no erythema  Skin: Warm dry intact with no signs of infection or rash on extremities or on axial skeleton.  Abdomen: Soft nontender  Neuro: Cranial nerves II through XII are intact, neurovascularly intact in all extremities with 2+ DTRs and 2+ pulses.  Lymph: No lymphadenopathy of posterior or anterior cervical chain or axillae bilaterally.  Gait very mild antalgic gait MSK:  Non tender with full range of motion and good stability and symmetric strength and tone of shoulders, elbows, wrist, hip and ankles bilaterally.  Knee exam has some very mild tenderness over the medial joint space.  Some instability noted with valgus and varus force.  Full range of motion of the knee noted.  Negative McMurray's today.    Impression and Recommendations:      The above documentation has been reviewed and is accurate and complete Joshua Pulley, DO       Note: This dictation  was prepared with Dragon dictation along with smaller phrase technology. Any transcriptional errors that result from this process are unintentional.

## 2019-06-23 NOTE — Assessment & Plan Note (Signed)
Underlying arthritis but is doing relatively well with conservative therapy.  Discussed icing regimen and home exercise, discussed which activities to do which wants to avoid.  Patient will follow-up again in 4 to 8 weeks.

## 2019-07-19 DIAGNOSIS — N400 Enlarged prostate without lower urinary tract symptoms: Secondary | ICD-10-CM | POA: Diagnosis not present

## 2019-07-20 DIAGNOSIS — R972 Elevated prostate specific antigen [PSA]: Secondary | ICD-10-CM | POA: Diagnosis not present

## 2019-07-20 DIAGNOSIS — N400 Enlarged prostate without lower urinary tract symptoms: Secondary | ICD-10-CM | POA: Diagnosis not present

## 2019-09-12 ENCOUNTER — Other Ambulatory Visit: Payer: Self-pay | Admitting: Internal Medicine

## 2019-09-12 NOTE — Telephone Encounter (Signed)
Please refill as per office routine med refill policy (all routine meds refilled for 3 mo or monthly per pt preference up to one year from last visit, then month to month grace period for 3 mo, then further med refills will have to be denied)  

## 2019-10-13 ENCOUNTER — Other Ambulatory Visit: Payer: Self-pay | Admitting: Internal Medicine

## 2019-10-26 ENCOUNTER — Ambulatory Visit (INDEPENDENT_AMBULATORY_CARE_PROVIDER_SITE_OTHER): Payer: BC Managed Care – PPO | Admitting: Internal Medicine

## 2019-10-26 ENCOUNTER — Encounter: Payer: Self-pay | Admitting: Internal Medicine

## 2019-10-26 ENCOUNTER — Other Ambulatory Visit: Payer: Self-pay

## 2019-10-26 VITALS — BP 128/68 | HR 90 | Temp 98.5°F | Ht 70.0 in | Wt 161.0 lb

## 2019-10-26 DIAGNOSIS — R739 Hyperglycemia, unspecified: Secondary | ICD-10-CM | POA: Diagnosis not present

## 2019-10-26 DIAGNOSIS — E559 Vitamin D deficiency, unspecified: Secondary | ICD-10-CM | POA: Diagnosis not present

## 2019-10-26 DIAGNOSIS — Z0000 Encounter for general adult medical examination without abnormal findings: Secondary | ICD-10-CM

## 2019-10-26 DIAGNOSIS — D509 Iron deficiency anemia, unspecified: Secondary | ICD-10-CM

## 2019-10-26 DIAGNOSIS — Z125 Encounter for screening for malignant neoplasm of prostate: Secondary | ICD-10-CM | POA: Diagnosis not present

## 2019-10-26 LAB — HEPATIC FUNCTION PANEL
ALT: 8 U/L (ref 0–53)
AST: 22 U/L (ref 0–37)
Albumin: 4 g/dL (ref 3.5–5.2)
Alkaline Phosphatase: 57 U/L (ref 39–117)
Bilirubin, Direct: 0.1 mg/dL (ref 0.0–0.3)
Total Bilirubin: 0.4 mg/dL (ref 0.2–1.2)
Total Protein: 6.3 g/dL (ref 6.0–8.3)

## 2019-10-26 LAB — URINALYSIS, ROUTINE W REFLEX MICROSCOPIC
Bilirubin Urine: NEGATIVE
Hgb urine dipstick: NEGATIVE
Ketones, ur: NEGATIVE
Leukocytes,Ua: NEGATIVE
Nitrite: NEGATIVE
RBC / HPF: NONE SEEN (ref 0–?)
Specific Gravity, Urine: 1.025 (ref 1.000–1.030)
Total Protein, Urine: NEGATIVE
Urine Glucose: NEGATIVE
Urobilinogen, UA: 1 (ref 0.0–1.0)
WBC, UA: NONE SEEN (ref 0–?)
pH: 5.5 (ref 5.0–8.0)

## 2019-10-26 LAB — FERRITIN: Ferritin: 16.6 ng/mL — ABNORMAL LOW (ref 22.0–322.0)

## 2019-10-26 LAB — BASIC METABOLIC PANEL
BUN: 17 mg/dL (ref 6–23)
CO2: 33 mEq/L — ABNORMAL HIGH (ref 19–32)
Calcium: 9.2 mg/dL (ref 8.4–10.5)
Chloride: 100 mEq/L (ref 96–112)
Creatinine, Ser: 1.29 mg/dL (ref 0.40–1.50)
GFR: 67.31 mL/min (ref >60.00)
Glucose, Bld: 113 mg/dL — ABNORMAL HIGH (ref 70–99)
Potassium: 3.9 mEq/L (ref 3.5–5.1)
Sodium: 140 mEq/L (ref 135–145)

## 2019-10-26 LAB — CBC WITH DIFFERENTIAL/PLATELET
Basophils Absolute: 0 10*3/uL (ref 0.0–0.1)
Basophils Relative: 0.8 % (ref 0.0–3.0)
Eosinophils Absolute: 0.4 10*3/uL (ref 0.0–0.7)
Eosinophils Relative: 7 % — ABNORMAL HIGH (ref 0.0–5.0)
HCT: 42 % (ref 39.0–52.0)
Hemoglobin: 14.4 g/dL (ref 13.0–17.0)
Lymphocytes Relative: 28.6 % (ref 12.0–46.0)
Lymphs Abs: 1.5 10*3/uL (ref 0.7–4.0)
MCHC: 34.3 g/dL (ref 30.0–36.0)
MCV: 101.2 fl — ABNORMAL HIGH (ref 78.0–100.0)
Monocytes Absolute: 0.4 10*3/uL (ref 0.1–1.0)
Monocytes Relative: 8.4 % (ref 3.0–12.0)
Neutro Abs: 2.9 10*3/uL (ref 1.4–7.7)
Neutrophils Relative %: 55.2 % (ref 43.0–77.0)
Platelets: 241 10*3/uL (ref 150.0–400.0)
RBC: 4.15 Mil/uL — ABNORMAL LOW (ref 4.22–5.81)
RDW: 14.4 % (ref 11.5–15.5)
WBC: 5.3 10*3/uL (ref 4.0–10.5)

## 2019-10-26 LAB — TSH: TSH: 1.61 u[IU]/mL (ref 0.35–4.50)

## 2019-10-26 LAB — LIPID PANEL
Cholesterol: 141 mg/dL (ref 0–200)
HDL: 42.1 mg/dL (ref >39.00)
LDL Cholesterol: 63 mg/dL (ref 0–99)
NonHDL: 98.59
Total CHOL/HDL Ratio: 3
Triglycerides: 178 mg/dL — ABNORMAL HIGH (ref 0.0–149.0)
VLDL: 35.6 mg/dL (ref 0.0–40.0)

## 2019-10-26 LAB — IBC PANEL
Iron: 43 ug/dL (ref 42–165)
Saturation Ratios: 10.4 % — ABNORMAL LOW (ref 20.0–50.0)
Transferrin: 295 mg/dL (ref 212.0–360.0)

## 2019-10-26 LAB — VITAMIN D 25 HYDROXY (VIT D DEFICIENCY, FRACTURES): VITD: 19.1 ng/mL — ABNORMAL LOW (ref 30.00–100.00)

## 2019-10-26 LAB — PSA: PSA: 8.31 ng/mL — ABNORMAL HIGH (ref 0.10–4.00)

## 2019-10-26 NOTE — Progress Notes (Signed)
Subjective:    Patient ID: Joshua Curtis, male    DOB: 1951-06-09, 69 y.o.   MRN: [id number]  HPI  Here for wellness and f/u;  Overall doing ok;  Pt denies Chest pain, worsening SOB, DOE, wheezing, orthopnea, PND, worsening LE edema, palpitations, dizziness or syncope.  Pt denies neurological change such as new headache, facial or extremity weakness.  Pt denies polydipsia, polyuria, or low sugar symptoms. Pt states overall good compliance with treatment and medications, good tolerability, and has been trying to follow appropriate diet.  Pt denies worsening depressive symptoms, suicidal ideation or panic. No fever, night sweats, wt loss, loss of appetite, or other constitutional symptoms.  Pt states good ability with ADL's, has low fall risk, home safety reviewed and adequate, no other significant changes in hearing or vision, and only occasionally active with exercise. May be having the covid shot soon at [employer]. No new complaints Past Medical History:  Diagnosis Date  . Anemia, iron deficiency 04/02/2016  . Aortic atherosclerosis ([facility])   . Arthritis    rt knee  . BPH (benign prostatic hyperplasia) 04/10/2017  . COLONIC POLYPS, HX OF 11/24/2007  . COPD (chronic obstructive pulmonary disease) ([facility])   . Coronary artery calcification seen on CAT scan   . DIVERTICULOSIS, COLON 11/24/2007  . ED (erectile dysfunction)   . HYPERTENSION 11/24/2007  . Increased prostate specific antigen (PSA) velocity 06/22/2011  . PAH (pulmonary artery hypertension) ([facility])    Past Surgical History:  Procedure Laterality Date  . NO PAST SURGERIES      reports that he has been smoking. He has never used smokeless tobacco. He reports current alcohol use. He reports that he does not use drugs. family history includes Anuerysm in his brother; Diabetes in his father and paternal grandfather; Hypertension in his sister; Kidney failure in his brother; Stroke in his mother. No Known Allergies Current Outpatient  Medications on File Prior to Visit  Medication Sig Dispense Refill  . amLODipine-benazepril (LOTREL) 10-40 MG capsule TAKE 1 CAPSULE BY MOUTH  DAILY 90 capsule 3  . aspirin 81 MG tablet Take 81 mg by mouth daily.      . hydrochlorothiazide (MICROZIDE) 12.5 MG capsule TAKE 1 CAPSULE BY MOUTH  DAILY 90 capsule 3  . meloxicam (MOBIC) 15 MG tablet Take 1 tablet (15 mg total) by mouth daily. Annual appt is due must see provider for future refills 30 tablet 0  . omeprazole (PRILOSEC) 40 MG capsule Take 1 capsule (40 mg total) by mouth 2 (two) times daily. Please schedule a follow up visit with Dr. Havery Moros before the end of the year for further refills. 180 capsule 1  . ferrous sulfate 325 (65 FE) MG tablet Take 1 tablet (325 mg total) by mouth daily with breakfast. (Patient not taking: Reported on 10/26/2019) 90 tablet 0  . Vitamin D, Ergocalciferol, (DRISDOL) 1.25 MG (50000 UT) CAPS capsule Take 1 capsule (50,000 Units total) by mouth every 7 (seven) days. (Patient not taking: Reported on 10/26/2019) 12 capsule 0   No current facility-administered medications on file prior to visit.   Review of Systems All otherwise neg per pt     Objective:   Physical Exam BP 128/68 (BP Location: Left Arm, Patient Position: Sitting, Cuff Size: Normal)   Pulse 90   Temp 98.5 F (36.9 C) (Oral)   Ht 5\' 10"  (1.778 m)   Wt 161 lb (73 kg)   SpO2 95%   BMI 23.10 kg/m  VS noted,  Constitutional: Pt appears in NAD HENT: Head: NCAT.  Right Ear: External ear normal.  Left Ear: External ear normal.  Eyes: . Pupils are equal, round, and reactive to light. Conjunctivae and EOM are normal Nose: without d/c or deformity Neck: Neck supple. Gross normal ROM Cardiovascular: Normal rate and regular rhythm.   Pulmonary/Chest: Effort normal and breath sounds without rales or wheezing.  Abd:  Soft, NT, ND, + BS, no organomegaly Neurological: Pt is alert. At baseline orientation, motor grossly intact Skin: Skin is warm.  No rashes, other new lesions, no LE edema Psychiatric: Pt behavior is normal without agitation  All otherwise neg per pt Lab Results  Component Value Date   WBC 6.0 04/21/2019   HGB 15.1 04/21/2019   HCT 45.0 04/21/2019   PLT 263.0 04/21/2019   GLUCOSE 103 (H) 04/21/2019   CHOL 128 04/21/2019   TRIG 215.0 (H) 04/21/2019   HDL 34.30 (L) 04/21/2019   LDLDIRECT 74.0 04/21/2019   LDLCALC 57 10/16/2018   ALT 10 04/21/2019   AST 20 04/21/2019   NA 139 04/21/2019   K 3.5 04/21/2019   CL 101 04/21/2019   CREATININE 1.40 04/21/2019   BUN 18 04/21/2019   CO2 30 04/21/2019   TSH 1.38 10/16/2018   PSA 7.31 (H) 10/03/2016   HGBA1C 5.6 04/21/2019      Assessment & Plan:

## 2019-10-26 NOTE — Assessment & Plan Note (Signed)
stable overall by history and exam, recent data reviewed with pt, and pt to continue medical treatment as before,  to f/u any worsening symptoms or concerns  

## 2019-10-26 NOTE — Assessment & Plan Note (Signed)

## 2019-10-26 NOTE — Patient Instructions (Signed)
Please continue all other medications as before, and refills have been done if requested.  Please have the pharmacy call with any other refills you may need.  Please continue your efforts at being more active, low cholesterol diet, and weight control.  You are otherwise up to date with prevention measures today.  Please keep your appointments with your specialists as you may have planned  Please go to the LAB at the blood drawing area for the tests to be done  You will be contacted by phone if any changes need to be made immediately.  Otherwise, you will receive a letter about your results with an explanation, but please check with MyChart first.  Please remember to sign up for MyChart if you have not done so, as this will be important to you in the future with finding out test results, communicating by private email, and scheduling acute appointments online when needed.  Please make an Appointment to return in 6 months

## 2019-10-26 NOTE — Assessment & Plan Note (Signed)
For replacement 

## 2019-10-26 NOTE — Assessment & Plan Note (Signed)
For iron level 

## 2019-10-28 ENCOUNTER — Encounter: Payer: Self-pay | Admitting: Internal Medicine

## 2019-10-28 LAB — HEMOGLOBIN A1C: Hgb A1c MFr Bld: 6.1 % (ref 4.6–6.5)

## 2019-11-06 ENCOUNTER — Other Ambulatory Visit: Payer: Self-pay | Admitting: Internal Medicine

## 2019-11-06 NOTE — Telephone Encounter (Signed)
Please refill as per office routine med refill policy (all routine meds refilled for 3 mo or monthly per pt preference up to one year from last visit, then month to month grace period for 3 mo, then further med refills will have to be denied)  

## 2019-11-13 ENCOUNTER — Ambulatory Visit: Payer: BC Managed Care – PPO

## 2019-11-13 ENCOUNTER — Ambulatory Visit: Payer: BC Managed Care – PPO | Attending: Internal Medicine

## 2019-11-13 DIAGNOSIS — Z23 Encounter for immunization: Secondary | ICD-10-CM

## 2019-11-13 NOTE — Progress Notes (Signed)
   Covid-19 Vaccination Clinic  Name:  Joshua Curtis    MRN: [id number] DOB: Jan 20, 1951  11/13/2019  [name] was observed post Covid-19 immunization for 15 minutes without incident. He was provided with Vaccine Information Sheet and instruction to access the V-Safe system.   [name] was instructed to call 911 with any severe reactions post vaccine: Marland Kitchen Difficulty breathing  . Swelling of face and throat  . A fast heartbeat  . A bad rash all over body  . Dizziness and weakness   Immunizations Administered    Name Date Dose VIS Date Route   Pfizer COVID-19 Vaccine 11/13/2019  9:01 AM 0.3 mL 08/13/2019 Intramuscular   Manufacturer: [employer]   Lot: [id number]   [place]: [id number]

## 2019-12-08 ENCOUNTER — Ambulatory Visit: Payer: BC Managed Care – PPO | Attending: Internal Medicine

## 2019-12-08 ENCOUNTER — Other Ambulatory Visit: Payer: Self-pay | Admitting: Internal Medicine

## 2019-12-08 DIAGNOSIS — Z23 Encounter for immunization: Secondary | ICD-10-CM

## 2019-12-08 IMAGING — DX DG KNEE COMPLETE 4+V*R*
4 series · 4 of 4 positions shown · non-contrast
Comparison: None.

CLINICAL DATA: Chronic right knee pain.  No known injury.

EXAM:
RIGHT KNEE - COMPLETE 4+ VIEW

[knee ap]
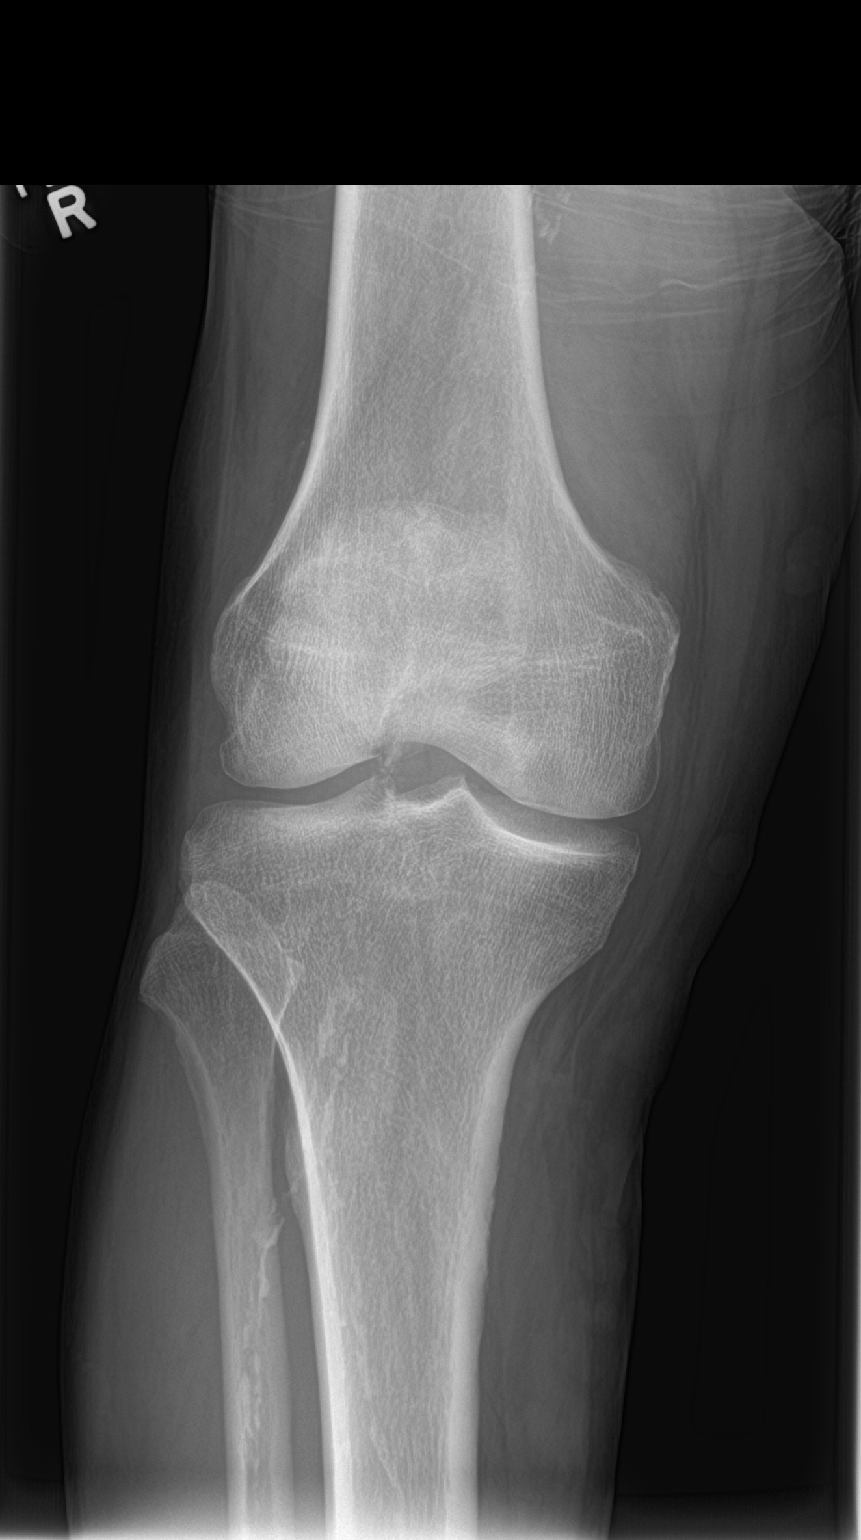

[knee tunnel]
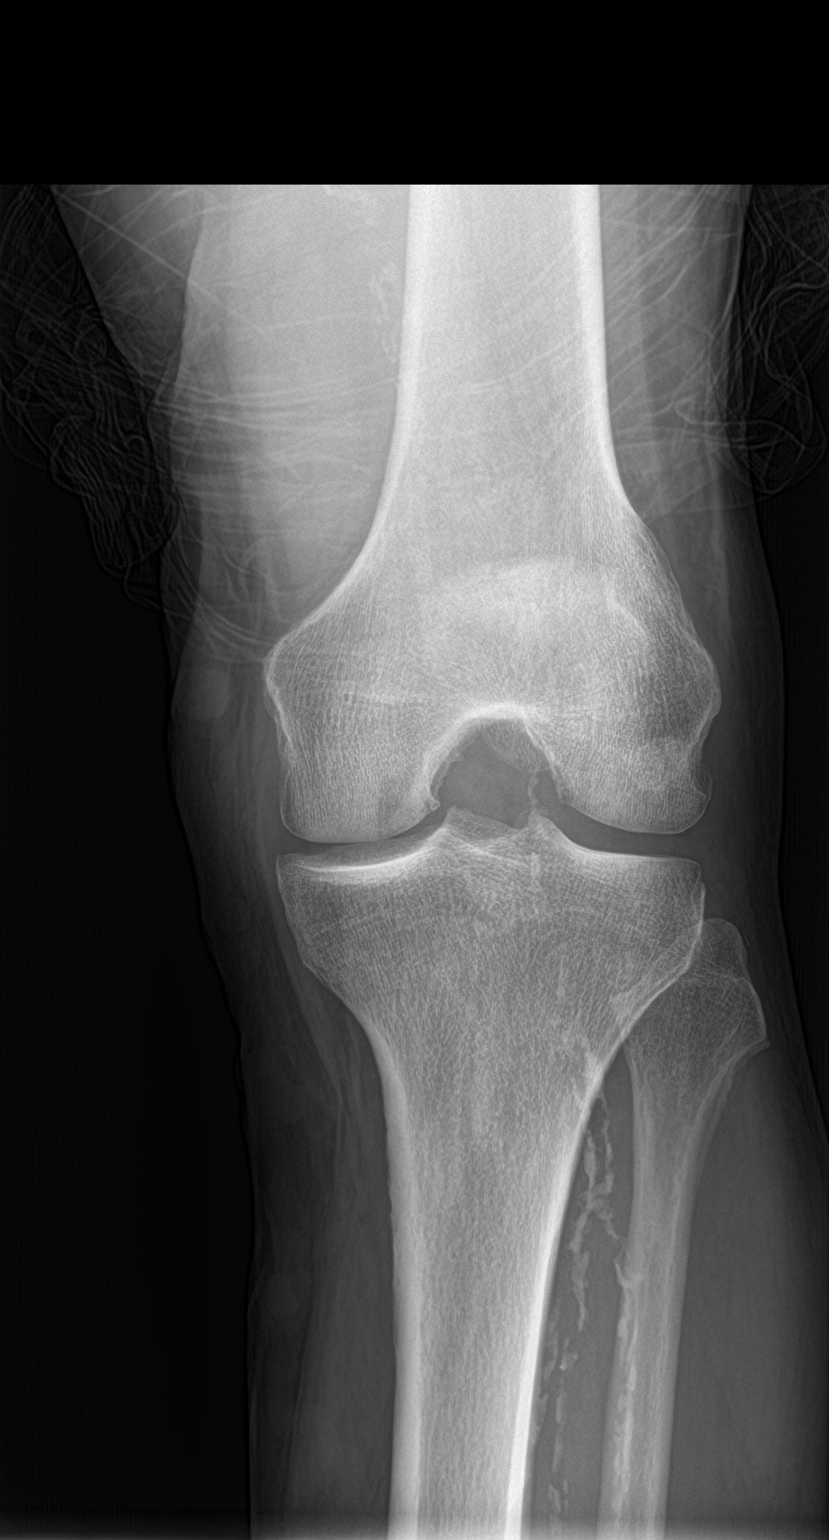

[knee lat]
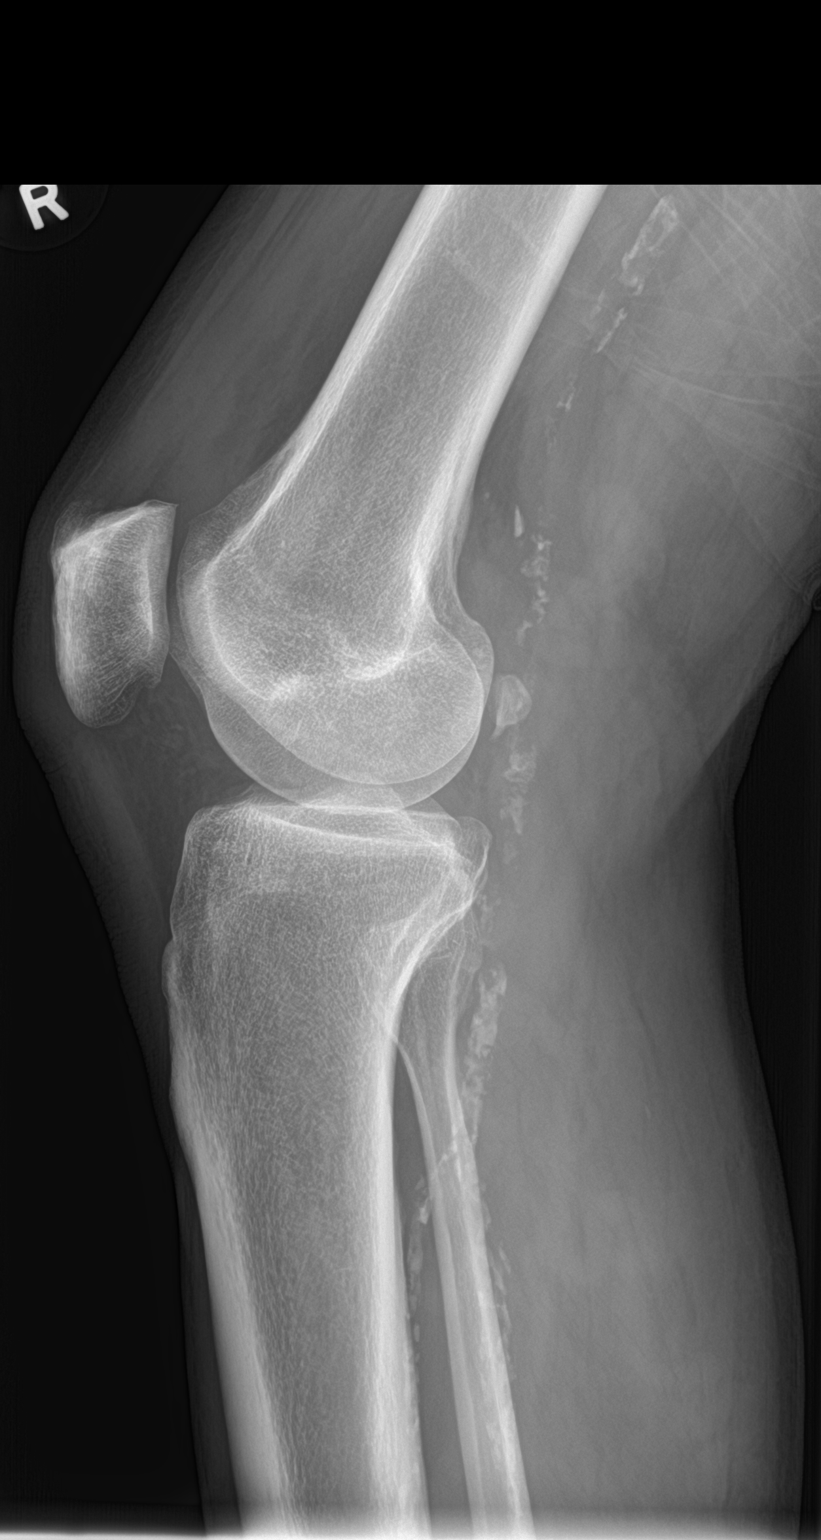

[sunrise]
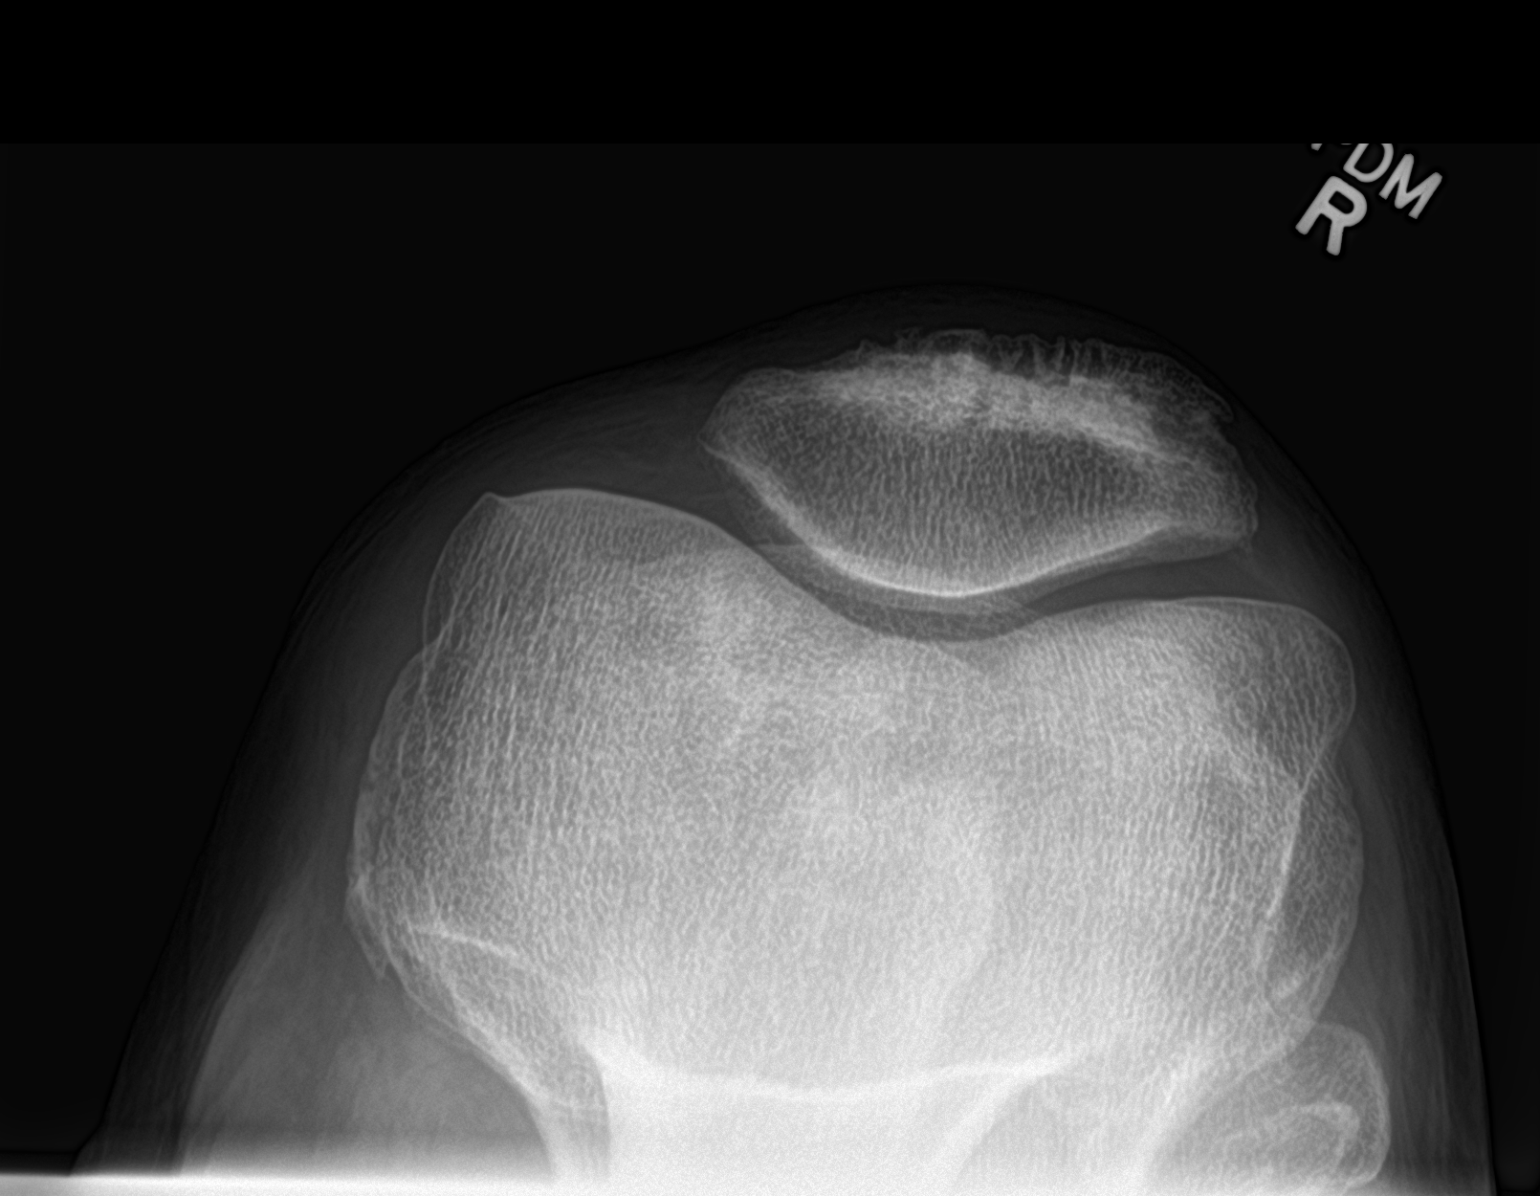

[4 of 4 positions shown; findings below may reference images not displayed]

FINDINGS: Early joint space narrowing and spurring in the medial compartment.
No acute bony abnormality. Specifically, no fracture, subluxation,
or dislocation. Small joint effusion. Vascular calcifications noted.
IMPRESSION: No acute bony abnormality. Early osteoarthritis in the medial
compartment with small joint effusion.

## 2019-12-08 NOTE — Progress Notes (Signed)
   Covid-19 Vaccination Clinic  Name:  Joshua Curtis    MRN: [id number] DOB: [birth year]  12/08/2019  [name] was observed post Covid-19 immunization for 15 minutes without incident. He was provided with Vaccine Information Sheet and instruction to access the V-Safe system.   [name] was instructed to call 911 with any severe reactions post vaccine: Marland Kitchen Difficulty breathing  . Swelling of face and throat  . A fast heartbeat  . A bad rash all over body  . Dizziness and weakness   Immunizations Administered    Name Date Dose VIS Date Route   Pfizer COVID-19 Vaccine 12/08/2019  9:23 AM 0.3 mL 08/13/2019 Intramuscular   Manufacturer: [employer]   Lot: [id number]   [place]: [id number]

## 2020-01-05 ENCOUNTER — Other Ambulatory Visit: Payer: Self-pay

## 2020-01-05 ENCOUNTER — Ambulatory Visit (AMBULATORY_SURGERY_CENTER): Payer: Self-pay | Admitting: *Deleted

## 2020-01-05 VITALS — Temp 97.5°F | Ht 70.0 in | Wt 159.4 lb

## 2020-01-05 DIAGNOSIS — Z86010 Personal history of colonic polyps: Secondary | ICD-10-CM

## 2020-01-05 DIAGNOSIS — D509 Iron deficiency anemia, unspecified: Secondary | ICD-10-CM

## 2020-01-05 MED ORDER — SUTAB 1479-225-188 MG PO TABS: 1.0000 | ORAL_TABLET | Freq: Once | ORAL | 0 refills | Status: AC

## 2020-01-05 NOTE — Progress Notes (Signed)
2nd dose of covid vaccine 12-08-19  Pt is aware that care partner will wait in the car during procedure; if they feel like they will be too hot or cold to wait in the car; they may wait in the 4 th floor lobby. Patient is aware to bring only one care partner. We want them to wear a mask (we do not have any that we can provide them), practice social distancing, and we will check their temperatures when they get here.  I did remind the patient that their care partner needs to stay in the parking lot the entire time and have a cell phone available, we will call them when the pt is ready for discharge. Patient will wear mask into building.  Pt states he had no issues with MAC with last procedure, no trouble moving neck   No egg or soy allergy  No home oxygen use   No medications for weight loss taken  emmi information given  Pt denies constipation issues   Sutab code put into RX and paper copy given to pt to show pharmacy

## 2020-01-13 ENCOUNTER — Encounter: Payer: Self-pay | Admitting: Gastroenterology

## 2020-01-17 DIAGNOSIS — R972 Elevated prostate specific antigen [PSA]: Secondary | ICD-10-CM | POA: Diagnosis not present

## 2020-01-19 ENCOUNTER — Ambulatory Visit (AMBULATORY_SURGERY_CENTER): Payer: BC Managed Care – PPO | Admitting: Gastroenterology

## 2020-01-19 ENCOUNTER — Other Ambulatory Visit: Payer: Self-pay

## 2020-01-19 ENCOUNTER — Encounter: Payer: Self-pay | Admitting: Gastroenterology

## 2020-01-19 VITALS — BP 102/65 | HR 75 | Temp 96.9°F | Resp 22 | Ht 70.0 in | Wt 159.0 lb

## 2020-01-19 DIAGNOSIS — D122 Benign neoplasm of ascending colon: Secondary | ICD-10-CM | POA: Diagnosis not present

## 2020-01-19 DIAGNOSIS — Z86010 Personal history of colonic polyps: Secondary | ICD-10-CM | POA: Diagnosis not present

## 2020-01-19 DIAGNOSIS — D124 Benign neoplasm of descending colon: Secondary | ICD-10-CM | POA: Diagnosis not present

## 2020-01-19 DIAGNOSIS — D125 Benign neoplasm of sigmoid colon: Secondary | ICD-10-CM

## 2020-01-19 DIAGNOSIS — D120 Benign neoplasm of cecum: Secondary | ICD-10-CM

## 2020-01-19 DIAGNOSIS — Z1211 Encounter for screening for malignant neoplasm of colon: Secondary | ICD-10-CM | POA: Diagnosis not present

## 2020-01-19 NOTE — Progress Notes (Signed)
pt tolerated well. VSS. awake and to recovery. Report given to RN.  

## 2020-01-19 NOTE — Progress Notes (Signed)
Called to room to assist during endoscopic procedure.  Patient ID and intended procedure confirmed with present staff. Received instructions for my participation in the procedure from the performing physician.  

## 2020-01-19 NOTE — Patient Instructions (Signed)
Thank you for allowing [place] to care for you today!  Await pathology results, approximately 7-10 days.  Will make recommendation at that time for your next colonoscopy.  Resume previous diet and medications today.  Return to your normal activities tomorrow.    YOU HAD AN ENDOSCOPIC PROCEDURE TODAY AT [facility]:   Refer to the procedure report that was given to you for any specific questions about what was found during the examination.  If the procedure report does not answer your questions, please call your gastroenterologist to clarify.  If you requested that your care partner not be given the details of your procedure findings, then the procedure report has been included in a sealed envelope for you to review at your convenience later.  YOU SHOULD EXPECT: Some feelings of bloating in the abdomen. Passage of more gas than usual.  Walking can help get rid of the air that was put into your GI tract during the procedure and reduce the bloating. If you had a lower endoscopy (such as a colonoscopy or flexible sigmoidoscopy) you may notice spotting of blood in your stool or on the toilet paper. If you underwent a bowel prep for your procedure, you may not have a normal bowel movement for a few days.  Please Note:  You might notice some irritation and congestion in your nose or some drainage.  This is from the oxygen used during your procedure.  There is no need for concern and it should clear up in a day or so.  SYMPTOMS TO REPORT IMMEDIATELY:   Following lower endoscopy (colonoscopy or flexible sigmoidoscopy):  Excessive amounts of blood in the stool  Significant tenderness or worsening of abdominal pains  Swelling of the abdomen that is new, acute  Fever of 100F or higher   Following upper endoscopy (EGD)  Vomiting of blood or coffee ground material  New chest pain or pain under the shoulder blades  Painful or persistently difficult swallowing  New shortness of  breath  Fever of 100F or higher  Black, tarry-looking stools  For urgent or emergent issues, a gastroenterologist can be reached at any hour by calling [phone]. Do not use MyChart messaging for urgent concerns.    DIET:  We do recommend a small meal at first, but then you may proceed to your regular diet.  Drink plenty of fluids but you should avoid alcoholic beverages for 24 hours.  ACTIVITY:  You should plan to take it easy for the rest of today and you should NOT DRIVE or use heavy machinery until tomorrow (because of the sedation medicines used during the test).    FOLLOW UP: Our staff will call the number listed on your records 48-72 hours following your procedure to check on you and address any questions or concerns that you may have regarding the information given to you following your procedure. If we do not reach you, we will leave a message.  We will attempt to reach you two times.  During this call, we will ask if you have developed any symptoms of COVID 19. If you develop any symptoms (ie: fever, flu-like symptoms, shortness of breath, cough etc.) before then, please call [phone].  If you test positive for Covid 19 in the 2 weeks post procedure, please call and report this information to [place].    If any biopsies were taken you will be contacted by phone or by letter within the next 1-3 weeks.  Please call us at [phone]  if you have not heard about the biopsies in 3 weeks.    SIGNATURES/CONFIDENTIALITY: You and/or your care partner have signed paperwork which will be entered into your electronic medical record.  These signatures attest to the fact that that the information above on your After Visit Summary has been reviewed and is understood.  Full responsibility of the confidentiality of this discharge information lies with you and/or your care-partner.

## 2020-01-19 NOTE — Op Note (Signed)
[facility] Patient Name: Joshua Curtis Procedure Date: 01/19/2020 11:10 AM MRN: [id number] Endoscopist: Joshua Curtis , MD Age: 69 Referring MD:  Date of Birth: 31-Mar-1951 Gender: Male Account #: [id number] Procedure:                Colonoscopy Indications:              Surveillance: Personal history of 5 adenomatous                            polyps on last colonoscopy 2018 Medicines:                Monitored Anesthesia Care Procedure:                Pre-Anesthesia Assessment:                           - Prior to the procedure, a History and Physical                            was performed, and patient medications and                            allergies were reviewed. The patient's tolerance of                            previous anesthesia was also reviewed. The risks                            and benefits of the procedure and the sedation                            options and risks were discussed with the patient.                            All questions were answered, and informed consent                            was obtained. Prior Anticoagulants: The patient has                            taken no previous anticoagulant or antiplatelet                            agents. ASA Grade Assessment: III - A patient with                            severe systemic disease. After reviewing the risks                            and benefits, the patient was deemed in                            satisfactory condition to undergo the procedure.  After obtaining informed consent, the colonoscope                            was passed under direct vision. Throughout the                            procedure, the patient's blood pressure, pulse, and                            oxygen saturations were monitored continuously. The                            Colonoscope was introduced through the anus and                            advanced to the the  cecum, identified by                            appendiceal orifice and ileocecal valve. The                            colonoscopy was performed without difficulty. The                            patient tolerated the procedure well. The quality                            of the bowel preparation was good. The ileocecal                            valve, appendiceal orifice, and rectum were                            photographed. Scope In: 11:17:32 AM Scope Out: 11:36:40 AM Scope Withdrawal Time: 0 hours 15 minutes 24 seconds  Total Procedure Duration: 0 hours 19 minutes 8 seconds  Findings:                 The perianal and digital rectal examinations were                            normal.                           A diminutive polyp was found in the cecum. The                            polyp was sessile. The polyp was removed with a                            cold snare. Resection and retrieval were complete.                           Two sessile polyps were found in the ascending  colon. The polyps were 3 to 4 mm in size. These                            polyps were removed with a cold snare. Resection                            and retrieval were complete.                           A 3 mm polyp was found in the descending colon. The                            polyp was sessile. The polyp was removed with a                            cold snare. Resection and retrieval were complete.                           A 4 mm polyp was found in the sigmoid colon. The                            polyp was sessile. The polyp was removed with a                            cold snare. Resection and retrieval were complete.                           Multiple small-mouthed diverticula were found in                            the sigmoid colon and ascending colon.                           Internal hemorrhoids were found during retroflexion.                           The exam  was otherwise without abnormality. Complications:            No immediate complications. Estimated blood loss:                            Minimal. Estimated Blood Loss:     Estimated blood loss was minimal. Impression:               - One diminutive polyp in the cecum, removed with a                            cold snare. Resected and retrieved.                           - Two 3 to 4 mm polyps in the ascending colon,  removed with a cold snare. Resected and retrieved.                           - One 3 mm polyp in the descending colon, removed                            with a cold snare. Resected and retrieved.                           - One 4 mm polyp in the sigmoid colon, removed with                            a cold snare. Resected and retrieved.                           - Diverticulosis in the sigmoid colon and in the                            ascending colon.                           - Internal hemorrhoids.                           - The examination was otherwise normal. Recommendation:           - Patient has a contact number available for                            emergencies. The signs and symptoms of potential                            delayed complications were discussed with the                            patient. Return to normal activities tomorrow.                            Written discharge instructions were provided to the                            patient.                           - Resume previous diet.                           - Continue present medications.                           - Await pathology results. Joshua Lipps P. Dereon Corkery, MD 01/19/2020 11:41:52 AM This report has been signed electronically.

## 2020-01-19 NOTE — Progress Notes (Signed)
Pt's states no medical or surgical changes since previsit or office visit.  Temp- June Vitals- Donna 

## 2020-01-21 ENCOUNTER — Telehealth: Payer: Self-pay

## 2020-01-21 NOTE — Telephone Encounter (Signed)
2nd follow up call made.  NAULM 

## 2020-01-21 NOTE — Telephone Encounter (Signed)
1st follow up call made.  NAULM 

## 2020-01-26 ENCOUNTER — Encounter: Payer: Self-pay | Admitting: Gastroenterology

## 2020-02-14 ENCOUNTER — Ambulatory Visit (INDEPENDENT_AMBULATORY_CARE_PROVIDER_SITE_OTHER): Payer: BC Managed Care – PPO | Admitting: Family

## 2020-02-14 ENCOUNTER — Other Ambulatory Visit: Payer: Self-pay

## 2020-02-14 ENCOUNTER — Other Ambulatory Visit: Payer: Self-pay | Admitting: Family

## 2020-02-14 ENCOUNTER — Encounter: Payer: Self-pay | Admitting: Family

## 2020-02-14 VITALS — BP 140/70 | HR 94 | Temp 98.6°F | Ht 70.0 in | Wt 156.0 lb

## 2020-02-14 DIAGNOSIS — M25551 Pain in right hip: Secondary | ICD-10-CM

## 2020-02-14 DIAGNOSIS — M545 Low back pain, unspecified: Secondary | ICD-10-CM

## 2020-02-14 MED ORDER — MELOXICAM 15 MG PO TABS
15.0000 mg | ORAL_TABLET | Freq: Every day | ORAL | 0 refills | Status: DC
Start: 2020-02-14 — End: 2020-05-02

## 2020-02-14 MED ORDER — METHOCARBAMOL 500 MG PO TABS: 500.0000 mg | ORAL_TABLET | Freq: Three times a day (TID) | ORAL | 0 refills | Status: DC | PRN

## 2020-02-14 NOTE — Progress Notes (Signed)
Joshua Curtis is a 69 y.o. male with the following history as recorded in EpicCare:  Patient Active Problem List   Diagnosis Date Noted  . Vitamin D deficiency 10/26/2019  . Right knee pain 04/21/2019  . Aortic atherosclerosis ([place])   . PAH (pulmonary artery hypertension) ([place])   . Coronary artery calcification seen on CAT scan   . Thoracic aortic aneurysm ([place]) 10/27/2017  . Elevated PSA 10/14/2017  . Pulmonary nodule 10/14/2017  . Colon polyps 10/14/2017  . BPH (benign prostatic hyperplasia) 04/10/2017  . Abnormal CT of the abdomen 01/12/2017  . Smoker 01/12/2017  . Anemia, iron deficiency 04/02/2016  . Racing heart beat 10/03/2015  . Hyperglycemia 03/30/2015  . Anemia 03/30/2015  . Hypertension 09/25/2012  . Increased prostate specific antigen (PSA) velocity 06/22/2011  . Preventative health care 06/16/2011  . DIVERTICULOSIS, COLON 11/24/2007  . COLONIC POLYPS, HX OF 11/24/2007    Current Outpatient Medications  Medication Sig Dispense Refill  . amLODipine-benazepril (LOTREL) 10-40 MG capsule TAKE 1 CAPSULE BY MOUTH  DAILY 90 capsule 3  . aspirin 81 MG tablet Take 81 mg by mouth daily.      . ferrous sulfate 325 (65 FE) MG tablet Take 1 tablet (325 mg total) by mouth daily with breakfast. 90 tablet 0  . hydrochlorothiazide (MICROZIDE) 12.5 MG capsule TAKE 1 CAPSULE BY MOUTH  DAILY 90 capsule 3  . omeprazole (PRILOSEC) 40 MG capsule Take 1 capsule (40 mg total) by mouth 2 (two) times daily. Please schedule a follow up visit with Dr. Havery Moros before the end of the year for further refills. 180 capsule 1  . meloxicam (MOBIC) 15 MG tablet Take 1 tablet (15 mg total) by mouth daily. 30 tablet 0  . methocarbamol (ROBAXIN) 500 MG tablet Take 1 tablet (500 mg total) by mouth every 8 (eight) hours as needed. 30 tablet 0   No current facility-administered medications for this visit.    Allergies: Patient has no known allergies.  Past Medical History:  Diagnosis Date  . Anemia,  iron deficiency 04/02/2016  . Aortic atherosclerosis ([place])   . Arthritis    rt knee  . BPH (benign prostatic hyperplasia) 04/10/2017  . COLONIC POLYPS, HX OF 11/24/2007  . COPD (chronic obstructive pulmonary disease) ([place])    pt denies this- no respiratory issues of any kind per pt  . Coronary artery calcification seen on CAT scan   . DIVERTICULOSIS, COLON 11/24/2007  . ED (erectile dysfunction)   . GERD (gastroesophageal reflux disease)   . HYPERTENSION 11/24/2007  . Increased prostate specific antigen (PSA) velocity 06/22/2011  . PAH (pulmonary artery hypertension) ([place])     Past Surgical History:  Procedure Laterality Date  . COLONOSCOPY    . NO PAST SURGERIES      Family History  Problem Relation Age of Onset  . Stroke Mother   . Diabetes Father   . Hypertension Sister   . Kidney failure Brother        dialysis  . Diabetes Paternal Grandfather   . Anuerysm Brother   . Colon cancer Neg Hx   . Esophageal cancer Neg Hx   . Rectal cancer Neg Hx   . Stomach cancer Neg Hx     Social History   Tobacco Use  . Smoking status: Current Every Day Smoker    Packs/day: 0.50    Types: Cigarettes  . Smokeless tobacco: Never Used  Substance Use Topics  . Alcohol use: Yes    Comment: occasional beer  Subjective:  Patient presents with concerns for acute right hip pain x 3 days; has seen sports medicine in the past with concerns for right knee pain; has not taken anything for symptoms; job is physical in nature- has not been able to complete a full shift since symptoms started; no numbness/ tingling into legs; no changes in bowel or bladder habits;   Objective:  Vitals:   02/14/20 0918  BP: 140/70  Pulse: 94  Temp: 98.6 F (37 C)  TempSrc: Oral  SpO2: 97%  Weight: 156 lb (70.8 kg)  Height: 5\' 10"  (1.778 m)    General: Well developed, well nourished, in no acute distress  Skin : Warm and dry.  Head: Normocephalic and atraumatic  Lungs: Respirations unlabored;   Musculoskeletal: No deformities; muscle spasm noted over right lower lumbar spine Extremities: No edema, cyanosis, clubbing  Vessels: Symmetric bilaterally  Neurologic: Alert and oriented; speech intact; face symmetrical; moves all extremities well; CNII-XII intact without focal deficit   Assessment:  1. Acute right-sided low back pain without sciatica   2. Right hip pain     Plan:  Rx for Mobic 15 mg daily, Robaxin 500 mg tid prn; okay to use heat; rest as able; work note given for next 3 days; follow-up worse, no better.  This visit occurred during the SARS-CoV-2 public health emergency.  Safety protocols were in place, including screening questions prior to the visit, additional usage of staff PPE, and extensive cleaning of exam room while observing appropriate contact time as indicated for disinfecting solutions.     No follow-ups on file.  No orders of the defined types were placed in this encounter.   Requested Prescriptions   Signed Prescriptions Disp Refills  . meloxicam (MOBIC) 15 MG tablet 30 tablet 0    Sig: Take 1 tablet (15 mg total) by mouth daily.  . methocarbamol (ROBAXIN) 500 MG tablet 30 tablet 0    Sig: Take 1 tablet (500 mg total) by mouth every 8 (eight) hours as needed.

## 2020-04-13 ENCOUNTER — Telehealth: Payer: Self-pay | Admitting: Internal Medicine

## 2020-04-13 ENCOUNTER — Other Ambulatory Visit: Payer: Self-pay

## 2020-04-13 ENCOUNTER — Ambulatory Visit (INDEPENDENT_AMBULATORY_CARE_PROVIDER_SITE_OTHER): Payer: BC Managed Care – PPO | Admitting: Internal Medicine

## 2020-04-13 ENCOUNTER — Ambulatory Visit (INDEPENDENT_AMBULATORY_CARE_PROVIDER_SITE_OTHER): Payer: BC Managed Care – PPO

## 2020-04-13 ENCOUNTER — Encounter: Payer: Self-pay | Admitting: Internal Medicine

## 2020-04-13 VITALS — BP 142/72 | HR 94 | Temp 97.5°F | Ht 70.0 in | Wt 156.0 lb

## 2020-04-13 DIAGNOSIS — F17200 Nicotine dependence, unspecified, uncomplicated: Secondary | ICD-10-CM

## 2020-04-13 DIAGNOSIS — E559 Vitamin D deficiency, unspecified: Secondary | ICD-10-CM

## 2020-04-13 DIAGNOSIS — S91102A Unspecified open wound of left great toe without damage to nail, initial encounter: Secondary | ICD-10-CM

## 2020-04-13 DIAGNOSIS — L03032 Cellulitis of left toe: Secondary | ICD-10-CM | POA: Insufficient documentation

## 2020-04-13 DIAGNOSIS — S91101A Unspecified open wound of right great toe without damage to nail, initial encounter: Secondary | ICD-10-CM

## 2020-04-13 DIAGNOSIS — R739 Hyperglycemia, unspecified: Secondary | ICD-10-CM | POA: Diagnosis not present

## 2020-04-13 DIAGNOSIS — M86141 Other acute osteomyelitis, right hand: Secondary | ICD-10-CM | POA: Diagnosis not present

## 2020-04-13 DIAGNOSIS — M86142 Other acute osteomyelitis, left hand: Secondary | ICD-10-CM | POA: Diagnosis not present

## 2020-04-13 DIAGNOSIS — I10 Essential (primary) hypertension: Secondary | ICD-10-CM

## 2020-04-13 MED ORDER — TRAMADOL HCL 50 MG PO TABS: 50.0000 mg | ORAL_TABLET | Freq: Four times a day (QID) | ORAL | 0 refills | Status: DC | PRN

## 2020-04-13 MED ORDER — DOXYCYCLINE HYCLATE 100 MG PO TABS: 100.0000 mg | ORAL_TABLET | Freq: Two times a day (BID) | ORAL | 0 refills | Status: DC

## 2020-04-13 NOTE — Telephone Encounter (Signed)
New message:   Pt is calling and states that doxycycline (VIBRA-TABS) 100 MG tablet refill was sent to [facility] but pt states he would like for it to go to [id number] - [place], [place]. Please advise.

## 2020-04-13 NOTE — Progress Notes (Signed)
Subjective:    Patient ID: Joshua Curtis, male    DOB: 03-10-1951, 69 y.o.   MRN: [id number]  HPI  Here with c/o 1 mo ongoing bilat distal tip great toe wounds, not sure of the origin but has older shoes and walks constantly every day for work, the right wound has seemed to improve by itself and slightly smaller now, less deep, and no worsening red, tender, swelling.  The left, however, has worsened now in the past several days such that the wound is slightly larger and the toe itself is diffusely more red, tender, swelling, without abscess or draiange or red streaks.  Denies high fever, chills.  No prior hx same, no prior hx of pAD.  Still smoking, maybe getting ready to quit.  Tolerating vit d well Past Medical History:  Diagnosis Date  . Anemia, iron deficiency 04/02/2016  . Aortic atherosclerosis ([place])   . Arthritis    rt knee  . BPH (benign prostatic hyperplasia) 04/10/2017  . COLONIC POLYPS, HX OF 11/24/2007  . COPD (chronic obstructive pulmonary disease) ([place])    pt denies this- no respiratory issues of any kind per pt  . Coronary artery calcification seen on CAT scan   . DIVERTICULOSIS, COLON 11/24/2007  . ED (erectile dysfunction)   . GERD (gastroesophageal reflux disease)   . HYPERTENSION 11/24/2007  . Increased prostate specific antigen (PSA) velocity 06/22/2011  . PAH (pulmonary artery hypertension) ([place])    Past Surgical History:  Procedure Laterality Date  . COLONOSCOPY    . NO PAST SURGERIES      reports that he has been smoking cigarettes. He has been smoking about 0.50 packs per day. He has never used smokeless tobacco. He reports current alcohol use. He reports that he does not use drugs. family history includes Anuerysm in his brother; Diabetes in his father and paternal grandfather; Hypertension in his sister; Kidney failure in his brother; Stroke in his mother. No Known Allergies Current Outpatient Medications on File Prior to Visit  Medication Sig Dispense Refill    . amLODipine-benazepril (LOTREL) 10-40 MG capsule TAKE 1 CAPSULE BY MOUTH  DAILY 90 capsule 3  . aspirin 81 MG tablet Take 81 mg by mouth daily.      . ferrous sulfate 325 (65 FE) MG tablet Take 1 tablet (325 mg total) by mouth daily with breakfast. 90 tablet 0  . hydrochlorothiazide (MICROZIDE) 12.5 MG capsule TAKE 1 CAPSULE BY MOUTH  DAILY 90 capsule 3  . meloxicam (MOBIC) 15 MG tablet Take 1 tablet (15 mg total) by mouth daily. 30 tablet 0  . methocarbamol (ROBAXIN) 500 MG tablet Take 1 tablet (500 mg total) by mouth every 8 (eight) hours as needed. 30 tablet 0  . omeprazole (PRILOSEC) 40 MG capsule Take 1 capsule (40 mg total) by mouth 2 (two) times daily. Please schedule a follow up visit with Dr. Havery Moros before the end of the year for further refills. 180 capsule 1  . SUTAB [phone] MG TABS See admin instructions.     No current facility-administered medications on file prior to visit.   Review of Systems All otherwise neg per pt     Objective:   Physical Exam BP (!) 142/72 (BP Location: Left Arm, Patient Position: Sitting, Cuff Size: Large)   Pulse 94   Temp (!) 97.5 F (36.4 C) (Oral)   Ht 5\' 10"  (1.778 m)   Wt 156 lb (70.8 kg)   SpO2 96%   BMI 22.38 kg/m  VS noted,  Constitutional: Pt appears in NAD HENT: Head: NCAT.  Right Ear: External ear normal.  Left Ear: External ear normal.  Eyes: . Pupils are equal, round, and reactive to light. Conjunctivae and EOM are normal Nose: without d/c or deformity Neck: Neck supple. Gross normal ROM Cardiovascular: Normal rate and regular rhythm.   Pulmonary/Chest: Effort normal and breath sounds without rales or wheezing.  Abd:  Soft, NT, ND, + BS, no organomegaly Neurological: Pt is alert. At baseline orientation, motor grossly intact Skin: no LE edema, tips of bilat great toes with approx 1 cm area shallow open wounds and left toe with mild diffuse red, tender swelling; dorsalis pedis trace to 1+ bilat Psychiatric: Pt  behavior is normal without agitation  All otherwise neg per pt Lab Results  Component Value Date   WBC 5.3 10/26/2019   HGB 14.4 10/26/2019   HCT 42.0 10/26/2019   PLT 241.0 10/26/2019   GLUCOSE 113 (H) 10/26/2019   CHOL 141 10/26/2019   TRIG 178.0 (H) 10/26/2019   HDL 42.10 10/26/2019   LDLDIRECT 74.0 04/21/2019   LDLCALC 63 10/26/2019   ALT 8 10/26/2019   AST 22 10/26/2019   NA 140 10/26/2019   K 3.9 10/26/2019   CL 100 10/26/2019   CREATININE 1.29 10/26/2019   BUN 17 10/26/2019   CO2 33 (H) 10/26/2019   TSH 1.61 10/26/2019   PSA 8.31 (H) 10/26/2019   HGBA1C 6.1 10/26/2019          Assessment & Plan:

## 2020-04-13 NOTE — Telephone Encounter (Signed)
New message:   Pt is calling and states that the doxycycline (VIBRA-TABS) 100 MG tablet is too expensive even with his insurance. Pt states he would like to know if something else can be called in for him that is more reasonable. Please advise.

## 2020-04-13 NOTE — Patient Instructions (Signed)
Please take all new medication as prescribed - the antibiotic, and pain medication as needed  You will be contacted regarding the referral for: podiatry, and the leg artery circulation testing  Please buy and wear shoes for work that are a HALF size larger to avoid hitting the toes  Please continue all other medications as before, including the OTC Vitamin D3 at 2000 units per day, indefinitely.  Please stop smoking  Please have the pharmacy call with any other refills you may need.  Please continue your efforts at being more active, low cholesterol diet, and weight control.  Please keep your appointments with your specialists as you may have planned  Please go to the XRAY Department in the first floor for the x-ray testing  You will be contacted by phone if any changes need to be made immediately.  Otherwise, you will receive a letter about your results with an explanation, but please check with MyChart first.  Please remember to sign up for MyChart if you have not done so, as this will be important to you in the future with finding out test results, communicating by private email, and scheduling acute appointments online when needed.  Ok to cancel the August visit  Please make an Appointment to return in 6 months, or sooner if needed

## 2020-04-13 NOTE — Telephone Encounter (Signed)
Rx sent in to correct pharmacy today as requested.

## 2020-04-14 ENCOUNTER — Encounter: Payer: Self-pay | Admitting: Internal Medicine

## 2020-04-14 ENCOUNTER — Other Ambulatory Visit: Payer: Self-pay | Admitting: Internal Medicine

## 2020-04-14 DIAGNOSIS — M869 Osteomyelitis, unspecified: Secondary | ICD-10-CM

## 2020-04-14 NOTE — Telephone Encounter (Signed)
Sounds like he is getting the rip off treatment from his pharmacy  Please let pt know that no one else complains about the cost, as this is generic.  Maybe the quantity is the issue, so taking 10 days medication may be the answer as well  Otherwise I would be happy to send to any other pharmacy that wont rip him off by trying to dispense a brand name med.   He is certainly free to ask at any pharmacy the cost of 10 days or even 21 days of treatment at any pharmacy.  [facility] does not require a membership and is usually less expensive, as well as [facility] and [facility]

## 2020-04-15 ENCOUNTER — Encounter: Payer: Self-pay | Admitting: Internal Medicine

## 2020-04-15 NOTE — Assessment & Plan Note (Signed)
Cont oral replacement 

## 2020-04-15 NOTE — Assessment & Plan Note (Signed)
No cellulitis but cant r/o osteo - for plain films today

## 2020-04-15 NOTE — Assessment & Plan Note (Addendum)
Mild to mod, for antibx course - doxy, pain control,  to f/u any worsening symptoms or concerns

## 2020-04-15 NOTE — Assessment & Plan Note (Addendum)
Cant r/o osteo - for plain films, also refer podiatry, also check bilat LE arterial dopplers; and needs newer shoes at least half size larger  I spent 41 minutes in preparing to see the patient by review of recent labs, imaging and procedures, obtaining and reviewing separately obtained history, communicating with the patient and family or caregiver, ordering medications, tests or procedures, and documenting clinical information in the EHR including the differential Dx, treatment, and any further evaluation and other management of bilateral great toe ulcer, left great toe cellulitis, vit d def, smoker, htn, hyperglycemia

## 2020-04-15 NOTE — Assessment & Plan Note (Signed)
Urged to quit, not ready 

## 2020-04-15 NOTE — Assessment & Plan Note (Signed)
stable overall by history and exam, recent data reviewed with pt, and pt to continue medical treatment as before,  to f/u any worsening symptoms or concerns  

## 2020-04-18 ENCOUNTER — Other Ambulatory Visit: Payer: Self-pay

## 2020-04-18 ENCOUNTER — Encounter: Payer: Self-pay | Admitting: Podiatry

## 2020-04-18 ENCOUNTER — Ambulatory Visit (INDEPENDENT_AMBULATORY_CARE_PROVIDER_SITE_OTHER): Payer: BC Managed Care – PPO | Admitting: Podiatry

## 2020-04-18 ENCOUNTER — Encounter: Payer: Self-pay | Admitting: Student

## 2020-04-18 VITALS — Temp 97.2°F

## 2020-04-18 DIAGNOSIS — L97524 Non-pressure chronic ulcer of other part of left foot with necrosis of bone: Secondary | ICD-10-CM

## 2020-04-18 DIAGNOSIS — I739 Peripheral vascular disease, unspecified: Secondary | ICD-10-CM

## 2020-04-18 DIAGNOSIS — M869 Osteomyelitis, unspecified: Secondary | ICD-10-CM

## 2020-04-18 NOTE — Progress Notes (Signed)
Subjective:   Patient ID: Joshua Curtis, male   DOB: 69 y.o.   MRN: [id number]   HPI 69 year old male presents the office today for concerns of ulceration of the distal aspect with his big toes.  This is been on for the last 1-1/2 months.  He states this started on the right side after he dropped a pallet at work on the toe stubbed his toe.  He has noticed some drainage coming from the area.  He follow-up with his primary care physician and he is on doxycycline at this time.  He is also referred to infectious disease due to osteomyelitis and he has arterial studies ordered.   Review of Systems  All other systems reviewed and are negative.  Past Medical History:  Diagnosis Date  . Anemia, iron deficiency 04/02/2016  . Aortic atherosclerosis ([place])   . Arthritis    rt knee  . BPH (benign prostatic hyperplasia) 04/10/2017  . COLONIC POLYPS, HX OF 11/24/2007  . COPD (chronic obstructive pulmonary disease) ([place])    pt denies this- no respiratory issues of any kind per pt  . Coronary artery calcification seen on CAT scan   . DIVERTICULOSIS, COLON 11/24/2007  . ED (erectile dysfunction)   . GERD (gastroesophageal reflux disease)   . HYPERTENSION 11/24/2007  . Increased prostate specific antigen (PSA) velocity 06/22/2011  . PAH (pulmonary artery hypertension) ([place])     Past Surgical History:  Procedure Laterality Date  . COLONOSCOPY    . NO PAST SURGERIES       Current Outpatient Medications:  .  amLODipine-benazepril (LOTREL) 10-40 MG capsule, TAKE 1 CAPSULE BY MOUTH  DAILY, Disp: 90 capsule, Rfl: 3 .  aspirin 81 MG tablet, Take 81 mg by mouth daily.  , Disp: , Rfl:  .  doxycycline (VIBRA-TABS) 100 MG tablet, Take 1 tablet (100 mg total) by mouth 2 (two) times daily., Disp: 42 tablet, Rfl: 0 .  ferrous sulfate 325 (65 FE) MG tablet, Take 1 tablet (325 mg total) by mouth daily with breakfast., Disp: 90 tablet, Rfl: 0 .  hydrochlorothiazide (MICROZIDE) 12.5 MG capsule, TAKE 1 CAPSULE  BY MOUTH  DAILY, Disp: 90 capsule, Rfl: 3 .  meloxicam (MOBIC) 15 MG tablet, Take 1 tablet (15 mg total) by mouth daily., Disp: 30 tablet, Rfl: 0 .  methocarbamol (ROBAXIN) 500 MG tablet, Take 1 tablet (500 mg total) by mouth every 8 (eight) hours as needed., Disp: 30 tablet, Rfl: 0 .  omeprazole (PRILOSEC) 40 MG capsule, Take 1 capsule (40 mg total) by mouth in the morning and at bedtime. Please schedule and office visit for further refills. Thank you, Disp: 60 capsule, Rfl: 1 .  SUTAB [phone] MG TABS, See admin instructions., Disp: , Rfl:  .  traMADol (ULTRAM) 50 MG tablet, Take 1 tablet (50 mg total) by mouth every 6 (six) hours as needed., Disp: 30 tablet, Rfl: 0  No Known Allergies       Objective:  Physical Exam  General: AAO x3, NAD  Dermatological: Ulcerations noted to the distal aspects of bilateral hallux with necrotic tissue present small no purulence is identified.  There is some fibrotic tissue present within the wound as well.  There is no ascending cellulitis there is no fluctuation or crepitation.       Vascular: Dorsalis Pedis artery and Posterior Tibial artery pedal pulses are decreased bilateral with immedate capillary fill time. There is no pain with calf compression, swelling, warmth, erythema.   Neruologic: Sensation decreased  with Thornell Mule monofilament  Musculoskeletal: No significant pain to the ulcerations.  Muscular strength 5/5 in all groups tested bilateral.  Gait: Unassisted, Nonantalgic.       Assessment:    Ulcerations with osteomyelitis bilateral hallux; PAD    Plan:  -Treatment options discussed including all alternatives, risks, and complications -Etiology of symptoms were discussed -I did review the x-rays he had previously as well as the chart.  He is already on doxycycline.  I did obtain a wound culture of the area today.  I ordered blood work with a CBC, ESR, CRP.  Also has arterial studies already ordered infectious disease  consultation pending.  Discussed with him he is at high risk of amputation of the toe.  Surgical shoes were dispensed bilaterally.  Recommended to hold off on returning to work at this point. -Monitor for any clinical signs or symptoms of infection and directed to call the office immediately should any occur or go to the ER.  Return in about 1 week (around 04/25/2020).  Trula Slade DPM

## 2020-04-19 ENCOUNTER — Other Ambulatory Visit: Payer: Self-pay | Admitting: Gastroenterology

## 2020-04-19 LAB — CBC WITH DIFFERENTIAL/PLATELET
Absolute Monocytes: 729 cells/uL (ref 200–950)
Basophils Absolute: 92 cells/uL (ref 0–200)
Basophils Relative: 1.7 %
Eosinophils Absolute: 178 cells/uL (ref 15–500)
Eosinophils Relative: 3.3 %
HCT: 45.8 % (ref 38.5–50.0)
Hemoglobin: 16.1 g/dL (ref 13.2–17.1)
Lymphs Abs: 1480 cells/uL (ref 850–3900)
MCH: 34.6 pg — ABNORMAL HIGH (ref 27.0–33.0)
MCHC: 35.2 g/dL (ref 32.0–36.0)
MCV: 98.5 fL (ref 80.0–100.0)
MPV: 10.7 fL (ref 7.5–12.5)
Monocytes Relative: 13.5 %
Neutro Abs: 2921 cells/uL (ref 1500–7800)
Neutrophils Relative %: 54.1 %
Platelets: 224 10*3/uL (ref 140–400)
RBC: 4.65 10*6/uL (ref 4.20–5.80)
RDW: 13.8 % (ref 11.0–15.0)
Total Lymphocyte: 27.4 %
WBC: 5.4 10*3/uL (ref 3.8–10.8)

## 2020-04-19 LAB — C-REACTIVE PROTEIN: CRP: 25 mg/L — ABNORMAL HIGH (ref <8.0)

## 2020-04-19 LAB — SEDIMENTATION RATE: Sed Rate: 2 mm/h (ref 0–20)

## 2020-04-20 NOTE — Telephone Encounter (Signed)
Spoe with pt and he has stated he was able to get his medication.  **Pt states has does not have any questions or concerns at this time.

## 2020-04-22 LAB — WOUND CULTURE
MICRO NUMBER:: 10836829
SPECIMEN QUALITY:: ADEQUATE

## 2020-04-25 ENCOUNTER — Other Ambulatory Visit: Payer: Self-pay

## 2020-04-25 ENCOUNTER — Ambulatory Visit (INDEPENDENT_AMBULATORY_CARE_PROVIDER_SITE_OTHER): Payer: BC Managed Care – PPO | Admitting: Podiatry

## 2020-04-25 ENCOUNTER — Encounter: Payer: Self-pay | Admitting: Podiatry

## 2020-04-25 ENCOUNTER — Ambulatory Visit: Payer: BC Managed Care – PPO | Admitting: Internal Medicine

## 2020-04-25 VITALS — Temp 97.4°F

## 2020-04-25 DIAGNOSIS — I739 Peripheral vascular disease, unspecified: Secondary | ICD-10-CM | POA: Diagnosis not present

## 2020-04-25 DIAGNOSIS — L97524 Non-pressure chronic ulcer of other part of left foot with necrosis of bone: Secondary | ICD-10-CM | POA: Diagnosis not present

## 2020-04-25 DIAGNOSIS — M869 Osteomyelitis, unspecified: Secondary | ICD-10-CM | POA: Diagnosis not present

## 2020-04-27 ENCOUNTER — Other Ambulatory Visit: Payer: Self-pay | Admitting: Internal Medicine

## 2020-04-30 NOTE — Progress Notes (Signed)
Subjective: 69 year old male presents the office today for evaluation of wound to the distal aspects of both of his big toes.  He states that they are doing somewhat better it seems.  Denies any odor but get some occasional clear drainage but no pus.  Denies any increase in swelling or redness to his foot.  He is awaiting vascular studies as well as infectious disease consultation. Denies any systemic complaints such as fevers, chills, nausea, vomiting. No acute changes since last appointment, and no other complaints at this time.   Objective: AAO x3, NAD Pulses decreased Ulcerations which are superficial with necrotic tissue present to distal aspect.  There is no drainage or pus identified and there is slight edema there is no warmth of the foot.  There is no ascending cellulitis.  There is no fluctuation crepitation. No pain with calf compression, swelling, warmth, erythema  Assessment: Ulcerations bilateral hallux with concern for osteomyelitis, PAD  Plan: -All treatment options discussed with the patient including all alternatives, risks, complications.  -Continue to clean the wound without any complications.  They appear to be stable today.  He is awaiting infectious aches consultation as well as arterial studies.  I tried contacting the lab to see if they can move up the testing.  We will continue to follow appointments.  Also encourage the patient to call the office to see if they can move this appointment up.  Continue surgical shoes and offloading.  Hold off return to work for now. -Patient encouraged to call the office with any questions, concerns, change in symptoms.   Trula Slade DPM

## 2020-05-02 ENCOUNTER — Other Ambulatory Visit: Payer: Self-pay

## 2020-05-02 ENCOUNTER — Encounter: Payer: Self-pay | Admitting: Podiatry

## 2020-05-02 ENCOUNTER — Ambulatory Visit (INDEPENDENT_AMBULATORY_CARE_PROVIDER_SITE_OTHER): Payer: BC Managed Care – PPO | Admitting: Podiatry

## 2020-05-02 ENCOUNTER — Other Ambulatory Visit: Payer: Self-pay | Admitting: Family

## 2020-05-02 ENCOUNTER — Encounter: Payer: Self-pay | Admitting: Internal Medicine

## 2020-05-02 ENCOUNTER — Ambulatory Visit (INDEPENDENT_AMBULATORY_CARE_PROVIDER_SITE_OTHER): Payer: BC Managed Care – PPO | Admitting: Internal Medicine

## 2020-05-02 DIAGNOSIS — M8610 Other acute osteomyelitis, unspecified site: Secondary | ICD-10-CM | POA: Diagnosis not present

## 2020-05-02 DIAGNOSIS — L97524 Non-pressure chronic ulcer of other part of left foot with necrosis of bone: Secondary | ICD-10-CM

## 2020-05-02 DIAGNOSIS — M869 Osteomyelitis, unspecified: Secondary | ICD-10-CM | POA: Diagnosis not present

## 2020-05-02 DIAGNOSIS — I739 Peripheral vascular disease, unspecified: Secondary | ICD-10-CM

## 2020-05-02 DIAGNOSIS — L97514 Non-pressure chronic ulcer of other part of right foot with necrosis of bone: Secondary | ICD-10-CM

## 2020-05-02 MED ORDER — AMOXICILLIN-POT CLAVULANATE 875-125 MG PO TABS: 1.0000 | ORAL_TABLET | Freq: Two times a day (BID) | ORAL | 0 refills | Status: DC

## 2020-05-02 NOTE — Progress Notes (Signed)
   Subjective: 69 year old male presents the office today for evaluation of wound to the distal aspects of both of his big toes.  He is some occasional discomfort to the toes but has not any significant drainage or pus.  He feels he may be doing somewhat better.  Still pending arterial studies as well as infectious disease consultation.  He is not sure when the appointments are scheduled. Denies any systemic complaints such as fevers, chills, nausea, vomiting. No acute changes since last appointment, and no other complaints at this time.   Objective: AAO x3, NAD Pulses decreased Ulcerations continue the distal aspects of bilateral hallux.  There is fibrotic, granular tissue present within the wound is smaller and necrotic tissue.  The necrotic tissue appears to be improved.  There is any cellulitis there is no fluctuation crepitation.  No probing to bone. No pain with calf compression, swelling, warmth, erythema  Assessment: Ulcerations bilateral hallux with concern for osteomyelitis, PAD  Plan: -All treatment options discussed with the patient including all alternatives, risks, complications.  -There is hypertrophy of the wound is utilizing the 312 with scalpel to remove some fibrotic tissue as well as necrotic tissue.  Wound continue Betadine to the toes daily.  Continue with surgical shoe and offloading.  Upon review of his chart he has an appointment this morning with infectious disease and I gave him the address and phone number to go there.  Also will follow on trying to the ultrasound to see if we can move up the appointment.  -Monitor for any clinical signs or symptoms of infection and directed to call the office immediately should any occur or go to the ER.  Return in about 2 weeks (around 05/16/2020).  Trula Slade DPM

## 2020-05-02 NOTE — Progress Notes (Signed)
[facility] for Infectious Disease  Reason for Consult: Acute osteomyelitis both great toes Referring Provider: Dr. Cathlean Cower  Assessment: I suspect that he has peripheral artery disease complicated by recent ulceration of both great toes and osteomyelitis.  Pictures taken today show significant improvement over pictures taken by Dr. Jacqualyn Posey on 04/18/2020.  I will repeat his C-reactive protein today.  I would like to broaden his antibiotic therapy to amoxicillin clavulanate since infections of the toes and feet are often mixed aerobic anaerobic affairs.  I will plan on 4 more weeks of therapy.  Plan: 1. Change doxycycline to amoxicillin clavulanate 2. Check C-reactive protein 3. Follow-up here in 6 weeks  Patient Active Problem List   Diagnosis Date Noted   Acute osteomyelitis ([place]) 05/02/2020    Priority: High   Open wound of right great toe 04/13/2020    Priority: High   Open wound of left great toe 04/13/2020    Priority: High   Cellulitis of toe, left 04/13/2020   Vitamin D deficiency 10/26/2019   Right knee pain 04/21/2019   Aortic atherosclerosis ([facility])    PAH (pulmonary artery hypertension) ([place])    Coronary artery calcification seen on CAT scan    Thoracic aortic aneurysm ([place]) 10/27/2017   Elevated PSA 10/14/2017   Pulmonary nodule 10/14/2017   Colon polyps 10/14/2017   BPH (benign prostatic hyperplasia) 04/10/2017   Abnormal CT of the abdomen 01/12/2017   Smoker 01/12/2017   Anemia, iron deficiency 04/02/2016   Racing heart beat 10/03/2015   Hyperglycemia 03/30/2015   Anemia 03/30/2015   Hypertension 09/25/2012   Increased prostate specific antigen (PSA) velocity 06/22/2011   Preventative health care 06/16/2011   DIVERTICULOSIS, COLON 11/24/2007   COLONIC POLYPS, HX OF 11/24/2007    Patient's Medications  New Prescriptions   AMOXICILLIN-CLAVULANATE (AUGMENTIN) 875-125 MG TABLET    Take 1 tablet by mouth 2 (two) times  daily.  Previous Medications   AMLODIPINE-BENAZEPRIL (LOTREL) 10-40 MG CAPSULE    TAKE 1 CAPSULE BY MOUTH  DAILY   ASPIRIN 81 MG TABLET    Take 81 mg by mouth daily.     FERROUS SULFATE 325 (65 FE) MG TABLET    Take 1 tablet (325 mg total) by mouth daily with breakfast.   HYDROCHLOROTHIAZIDE (MICROZIDE) 12.5 MG CAPSULE    TAKE 1 CAPSULE BY MOUTH  DAILY   MELOXICAM (MOBIC) 15 MG TABLET    Take 1 tablet (15 mg total) by mouth daily.   METHOCARBAMOL (ROBAXIN) 500 MG TABLET    Take 1 tablet (500 mg total) by mouth every 8 (eight) hours as needed.   OMEPRAZOLE (PRILOSEC) 40 MG CAPSULE    Take 1 capsule (40 mg total) by mouth in the morning and at bedtime. Please schedule and office visit for further refills. Thank you   SUTAB [phone] MG TABS    See admin instructions.   TRAMADOL (ULTRAM) 50 MG TABLET    Take 1 tablet (50 mg total) by mouth every 6 (six) hours as needed.  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  Several weeks later he noted a similar lesion on his left great toe.  He  denies any trauma.  He has not had any new shoes or boots.  He wears his old [employer] running shoes when he is at work.  He is on his feet for many hours each day.  He saw Dr. Jenny Reichmann on 04/13/2020.  X-rays were ordered that revealed bilateral osteomyelitis of the distal tuft of both great toes, right greater than left.  He was started on doxycycline on 04/15/2020 and referred to Dr. Celesta Gentile.  He noted diminished pulses in both feet and decreased sensation.  A culture was obtained which grew MSSA.  Sed rate was normal at 2 but C-reactive protein was elevated at 25.  Joshua Curtis feels like the wounds are improving and this is confirmed by Dr. Pasty Arch note from earlier  this morning.  Joshua Curtis smokes cigarettes.  Review of Systems: Review of Systems  Constitutional: Negative for chills, diaphoresis and fever.  Gastrointestinal: Negative for abdominal pain, diarrhea, nausea and vomiting.  Musculoskeletal: Positive for joint pain.      Past Medical History:  Diagnosis Date   Anemia, iron deficiency 04/02/2016   Aortic atherosclerosis ([facility])    Arthritis    rt knee   BPH (benign prostatic hyperplasia) 04/10/2017   COLONIC POLYPS, HX OF 11/24/2007   COPD (chronic obstructive pulmonary disease) ([facility])    pt denies this- no respiratory issues of any kind per pt   Coronary artery calcification seen on CAT scan    DIVERTICULOSIS, COLON 11/24/2007   ED (erectile dysfunction)    GERD (gastroesophageal reflux disease)    HYPERTENSION 11/24/2007   Increased prostate specific antigen (PSA) velocity 06/22/2011   PAH (pulmonary artery hypertension) ([facility])     Social History   Tobacco Use   Smoking status: Current Every Day Smoker    Packs/day: 0.25    Types: Cigarettes   Smokeless tobacco: Never Used  Vaping Use   Vaping Use: Never used  Substance Use Topics   Alcohol use: Yes    Comment: 1 liquor drink per day    Drug use: No    Family History  Problem Relation Age of Onset   Stroke Mother    Diabetes Father    Hypertension Sister    Kidney failure Brother        dialysis   Diabetes Paternal Grandfather    Anuerysm Brother    Colon cancer Neg Hx    Esophageal cancer Neg Hx    Rectal cancer Neg Hx    Stomach cancer Neg Hx    No Known Allergies  OBJECTIVE: Vitals:   05/02/20 0844  BP: (!) 149/77  Pulse: 91  Temp: 98.6 F (37 C)  SpO2: 98%  Weight: 152 lb (68.9 kg)  Height: 5\' 10"  (1.778 m)   Body mass index is 21.81 kg/m.   Physical Exam Constitutional:      Comments: He is very pleasant and in no distress.  Musculoskeletal:     Comments: Has ulcerations over the tip of both great toes.  There is  no significant drainage or malodor.  There is no surrounding cellulitis or fluctuance.  Note any exposed bone.       Microbiology: No results found for this or any previous visit (from the past 240 hour(s)).  Michel Bickers, MD [facility] for [facility] [phone] pager   [phone] cell 05/02/2020, 9:17 AM

## 2020-05-03 LAB — C-REACTIVE PROTEIN: CRP: 1.6 mg/L (ref <8.0)

## 2020-05-04 ENCOUNTER — Telehealth: Payer: Self-pay | Admitting: *Deleted

## 2020-05-04 NOTE — Telephone Encounter (Signed)
Called [facility] Dr Kennon Holter office today and asked if we could get our patient an appointment sooner than 05-15-2020 and I spoke with Abbe Amsterdam and he stated that this was the earliest appointment but they could put patient on a wait list and I stated that we appreciated that and to call patient if any. Lattie Haw

## 2020-05-15 ENCOUNTER — Other Ambulatory Visit (HOSPITAL_COMMUNITY): Payer: Self-pay | Admitting: Internal Medicine

## 2020-05-15 ENCOUNTER — Other Ambulatory Visit: Payer: Self-pay | Admitting: Internal Medicine

## 2020-05-15 ENCOUNTER — Ambulatory Visit (HOSPITAL_COMMUNITY)
Admission: RE | Admit: 2020-05-15 | Discharge: 2020-05-15 | Disposition: A | Discharge status: Home / Self Care | Payer: BC Managed Care – PPO | Source: Ambulatory Visit | Attending: Cardiology | Admitting: Cardiology

## 2020-05-15 ENCOUNTER — Encounter: Payer: Self-pay | Admitting: Internal Medicine

## 2020-05-15 ENCOUNTER — Other Ambulatory Visit: Payer: Self-pay

## 2020-05-15 DIAGNOSIS — L03032 Cellulitis of left toe: Secondary | ICD-10-CM | POA: Insufficient documentation

## 2020-05-15 DIAGNOSIS — I739 Peripheral vascular disease, unspecified: Secondary | ICD-10-CM

## 2020-05-15 DIAGNOSIS — L97519 Non-pressure chronic ulcer of other part of right foot with unspecified severity: Secondary | ICD-10-CM

## 2020-05-15 DIAGNOSIS — S91102A Unspecified open wound of left great toe without damage to nail, initial encounter: Secondary | ICD-10-CM | POA: Diagnosis not present

## 2020-05-15 DIAGNOSIS — S91101A Unspecified open wound of right great toe without damage to nail, initial encounter: Secondary | ICD-10-CM

## 2020-05-15 DIAGNOSIS — R739 Hyperglycemia, unspecified: Secondary | ICD-10-CM

## 2020-05-15 DIAGNOSIS — L97529 Non-pressure chronic ulcer of other part of left foot with unspecified severity: Secondary | ICD-10-CM

## 2020-05-15 DIAGNOSIS — F17200 Nicotine dependence, unspecified, uncomplicated: Secondary | ICD-10-CM

## 2020-05-18 ENCOUNTER — Ambulatory Visit (INDEPENDENT_AMBULATORY_CARE_PROVIDER_SITE_OTHER): Payer: BC Managed Care – PPO

## 2020-05-18 ENCOUNTER — Encounter: Payer: Self-pay | Admitting: Podiatry

## 2020-05-18 ENCOUNTER — Other Ambulatory Visit: Payer: Self-pay

## 2020-05-18 ENCOUNTER — Ambulatory Visit (INDEPENDENT_AMBULATORY_CARE_PROVIDER_SITE_OTHER): Payer: BC Managed Care – PPO | Admitting: Podiatry

## 2020-05-18 DIAGNOSIS — M869 Osteomyelitis, unspecified: Secondary | ICD-10-CM | POA: Diagnosis not present

## 2020-05-18 DIAGNOSIS — L0390 Cellulitis, unspecified: Secondary | ICD-10-CM | POA: Diagnosis not present

## 2020-05-18 DIAGNOSIS — I739 Peripheral vascular disease, unspecified: Secondary | ICD-10-CM | POA: Diagnosis not present

## 2020-05-18 MED ORDER — SANTYL 250 UNIT/GM EX OINT: 1.0000 "application " | TOPICAL_OINTMENT | Freq: Every day | CUTANEOUS | 0 refills | Status: DC

## 2020-05-21 NOTE — Progress Notes (Addendum)
   Subjective: 69 year old male presents the office today for evaluation of wound to the distal aspects of both of his big toes.  Reports some intermittent discomfort at the toes.  He has not seen any significant drainage or purulence.  No increase in swelling or redness to the toes or feet.  He is currently on antibiotics and he states that he has been having somewhat of an upset stomach but no significant diarrhea any fevers or chills.  Objective: AAO x3, NAD Pulses decreased Ulcerations continue the distal aspects of bilateral hallux which continue to be fibrotic with some regulation tissue present.  Some necrotic tissue as well.  There is no probing to bone, undermining or tunneling.  Minimal edema.  There is no erythema or warmth.  No fluctuation or crepitation.  There is no malodor. No pain with calf compression, swelling, warmth, erythema  Assessment: Ulcerations bilateral hallux with concern for osteomyelitis, PAD  Plan: -All treatment options discussed with the patient including all alternatives, risks, complications.  -Repeat x-rays obtained and reviewed.  Lucencies noted the distal aspects of bilateral hallux but appear to be stable.  No soft tissue edema. -Arterial studies reviewed.  Also placed vascular surgery consult. Referral also previously placed by Dr. Uvaldo Rising debrided the wound today without any complications.  We will switch to Santyl dressing changes. -Continue antibiotics per infectious disease.  Unlikely because of abscess clinically no other issues.  Recommend to follow-up with infectious disease about this.  -Monitor for any clinical signs or symptoms of infection and directed to call the office immediately should any occur or go to the ER.  Return in about 2 weeks (around 06/01/2020).  Trula Slade DPM

## 2020-05-26 ENCOUNTER — Other Ambulatory Visit: Payer: Self-pay | Admitting: Internal Medicine

## 2020-05-30 ENCOUNTER — Encounter: Payer: Self-pay | Admitting: Internal Medicine

## 2020-05-30 ENCOUNTER — Other Ambulatory Visit: Payer: Self-pay

## 2020-05-30 ENCOUNTER — Ambulatory Visit (HOSPITAL_COMMUNITY)
Admission: RE | Admit: 2020-05-30 | Discharge: 2020-05-30 | Disposition: A | Discharge status: Home / Self Care | Payer: BC Managed Care – PPO | Source: Ambulatory Visit | Attending: Cardiovascular Disease | Admitting: Cardiovascular Disease

## 2020-05-30 DIAGNOSIS — L97519 Non-pressure chronic ulcer of other part of right foot with unspecified severity: Secondary | ICD-10-CM | POA: Diagnosis not present

## 2020-05-30 DIAGNOSIS — I739 Peripheral vascular disease, unspecified: Secondary | ICD-10-CM | POA: Diagnosis not present

## 2020-05-30 DIAGNOSIS — L97529 Non-pressure chronic ulcer of other part of left foot with unspecified severity: Secondary | ICD-10-CM | POA: Diagnosis not present

## 2020-06-01 ENCOUNTER — Other Ambulatory Visit: Payer: Self-pay

## 2020-06-01 ENCOUNTER — Encounter: Payer: Self-pay | Admitting: Podiatry

## 2020-06-01 ENCOUNTER — Ambulatory Visit (INDEPENDENT_AMBULATORY_CARE_PROVIDER_SITE_OTHER): Payer: BC Managed Care – PPO | Admitting: Podiatry

## 2020-06-01 DIAGNOSIS — L97514 Non-pressure chronic ulcer of other part of right foot with necrosis of bone: Secondary | ICD-10-CM

## 2020-06-01 DIAGNOSIS — M869 Osteomyelitis, unspecified: Secondary | ICD-10-CM

## 2020-06-01 DIAGNOSIS — I739 Peripheral vascular disease, unspecified: Secondary | ICD-10-CM | POA: Diagnosis not present

## 2020-06-01 DIAGNOSIS — L97524 Non-pressure chronic ulcer of other part of left foot with necrosis of bone: Secondary | ICD-10-CM | POA: Diagnosis not present

## 2020-06-01 NOTE — Progress Notes (Signed)
Subjective: 69 year old male presents the office today for evaluation of wound to the distal aspects of both of his big toes.  He states the wounds are doing well the same.  He still gets pain to the toes.  He did have his vascular studies performed this will be has not been told the results yet.  He has no other concerns today.  Denies any drainage or pus or any increase in swelling or redness.  Still wearing surgical shoes for offloading.  Objective: AAO x3, NAD Difficult to palpate pulses. Ulcerations continue the distal aspects of bilateral hallux which continue to be fibrotic with some granulation tissue present.  There is no edema, erythema or signs of infection or ascending cellulitis noted.  There is no other open lesions. No pain with calf compression, swelling, warmth, erythema  Assessment: Ulcerations bilateral hallux with concern for osteomyelitis, PAD  Plan: -All treatment options discussed with the patient including all alternatives, risks, complications.  -I cleaned the wounds. Continue daily dressing changes.  Awaiting vascular surgery consult.  Referrals previously been placed.  I reviewed the arterial studies with him as well which showed significant decrease in circulation. -Continue as per infectious disease.  Trula Slade DPM

## 2020-06-06 ENCOUNTER — Telehealth: Payer: Self-pay | Admitting: Podiatry

## 2020-06-06 NOTE — Telephone Encounter (Signed)
Patient said he said he had not heard anything regarding vascular surgery consult.

## 2020-06-07 NOTE — Telephone Encounter (Signed)
I called to follow up on the vein and vascular referral. The earliest they can get him in is Oct 25th . I was informed they don't have a wait list and was advised to have the patient call daily to see if there are any cancellations. I informed the patient of this and of the appointment date/time/location.

## 2020-06-08 ENCOUNTER — Encounter: Payer: Self-pay | Admitting: Internal Medicine

## 2020-06-08 ENCOUNTER — Other Ambulatory Visit: Payer: Self-pay

## 2020-06-08 ENCOUNTER — Ambulatory Visit (INDEPENDENT_AMBULATORY_CARE_PROVIDER_SITE_OTHER): Payer: BC Managed Care – PPO | Admitting: Internal Medicine

## 2020-06-08 DIAGNOSIS — M8610 Other acute osteomyelitis, unspecified site: Secondary | ICD-10-CM

## 2020-06-08 NOTE — Progress Notes (Signed)
[place] [facility] Infectious Disease  Reason for Consult: Acute osteomyelitis both great toes Referring Provider: Dr. Cathlean Cower  Assessment: I suspect that he has peripheral artery disease complicated by recent ulceration of both great toes and osteomyelitis.  Pictures taken today show significant improvement over pictures taken by Dr. Jacqualyn Posey on 04/18/2020.  I will repeat his C-reactive protein today.  I would like to broaden his antibiotic therapy to amoxicillin clavulanate since infections of the toes and feet are often mixed aerobic anaerobic affairs.  I will plan on 4 more weeks of therapy.  Plan: 1. Change doxycycline to amoxicillin clavulanate 2. Check C-reactive protein 3. Follow-up here in 6 weeks  Patient Active Problem List   Diagnosis Date Noted  . Acute osteomyelitis ([place]) 05/02/2020    Priority: High  . Open wound of right great toe 04/13/2020    Priority: High  . Open wound of left great toe 04/13/2020    Priority: High  . Cellulitis of toe, left 04/13/2020  . Vitamin D deficiency 10/26/2019  . Right knee pain 04/21/2019  . Aortic atherosclerosis ([place])   . PAH (pulmonary artery hypertension) ([place])   . Coronary artery calcification seen on CAT scan   . Thoracic aortic aneurysm ([place]) 10/27/2017  . Elevated PSA 10/14/2017  . Pulmonary nodule 10/14/2017  . Colon polyps 10/14/2017  . BPH (benign prostatic hyperplasia) 04/10/2017  . Abnormal CT of the abdomen 01/12/2017  . Smoker 01/12/2017  . Anemia, iron deficiency 04/02/2016  . Racing heart beat 10/03/2015  . Hyperglycemia 03/30/2015  . Anemia 03/30/2015  . Hypertension 09/25/2012  . Increased prostate specific antigen (PSA) velocity 06/22/2011  . Preventative health care 06/16/2011  . DIVERTICULOSIS, COLON 11/24/2007  . COLONIC POLYPS, HX OF 11/24/2007    Patient's Medications  New Prescriptions   No medications on file  Previous Medications   AMLODIPINE-BENAZEPRIL (LOTREL) 10-40 MG CAPSULE     TAKE 1 CAPSULE BY MOUTH  DAILY   AMOXICILLIN-CLAVULANATE (AUGMENTIN) 875-125 MG TABLET    Take 1 tablet by mouth 2 (two) times daily.   ASPIRIN 81 MG TABLET    Take 81 mg by mouth daily.     COLLAGENASE (SANTYL) OINTMENT    Apply 1 application topically daily.   DOXYCYCLINE (DORYX) 100 MG EC TABLET    Take 100 mg by mouth 2 (two) times daily.   FERROUS SULFATE 325 (65 FE) MG TABLET    Take 1 tablet (325 mg total) by mouth daily with breakfast.   HYDROCHLOROTHIAZIDE (MICROZIDE) 12.5 MG CAPSULE    TAKE 1 CAPSULE BY MOUTH  DAILY   MELOXICAM (MOBIC) 15 MG TABLET    TAKE 1 TABLET BY MOUTH EVERY DAY   METHOCARBAMOL (ROBAXIN) 500 MG TABLET    Take 1 tablet (500 mg total) by mouth every 8 (eight) hours as needed.   OMEPRAZOLE (PRILOSEC) 40 MG CAPSULE    Take 1 capsule (40 mg total) by mouth in the morning and at bedtime. Please schedule and office visit for further refills. Thank you   SUTAB [phone] MG TABS    See admin instructions.    TRAMADOL (ULTRAM) 50 MG TABLET    Take 1 tablet (50 mg total) by mouth every 6 (six) hours as needed.  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  Several weeks later he noted a similar lesion on his left great toe.  He denies any trauma.  He has not had any new shoes or boots.  He wears his old Nike running shoes when he is at work.  He is on his feet for many hours each day.  He saw Dr. Jenny Reichmann on 04/13/2020.  X-rays were ordered that revealed bilateral osteomyelitis of the distal tuft of both great toes, right greater than left.  He was started on doxycycline on 04/15/2020 and referred to Dr. Celesta Gentile.  He noted diminished pulses in both feet and decreased sensation.  A culture was obtained which grew MSSA.  Sed rate was normal at 2 but C-reactive protein was  elevated at 25.  [name] feels like the wounds are improving and this is confirmed by Dr. Pasty Arch note from earlier this morning.  Mr. Tammen smokes cigarettes.  I broadened his antibiotic therapy to amoxicillin clavulanate and he completed 6 weeks of therapy on 05/19/2020.  Recent Doppler ultrasound did confirm peripheral artery disease.  He has been referred to vascular surgery.  Review of Systems: Review of Systems  Constitutional: Negative for chills, diaphoresis and fever.  Gastrointestinal: Negative for abdominal pain, diarrhea, nausea and vomiting.  Musculoskeletal: Positive for joint pain.       He has pain in both great toes.        Past Medical History:  Diagnosis Date  . Anemia, iron deficiency 04/02/2016  . Aortic atherosclerosis ([place])   . Arthritis    rt knee  . BPH (benign prostatic hyperplasia) 04/10/2017  . COLONIC POLYPS, HX OF 11/24/2007  . COPD (chronic obstructive pulmonary disease) ([place])    pt denies this- no respiratory issues of any kind per pt  . Coronary artery calcification seen on CAT scan   . DIVERTICULOSIS, COLON 11/24/2007  . ED (erectile dysfunction)   . GERD (gastroesophageal reflux disease)   . HYPERTENSION 11/24/2007  . Increased prostate specific antigen (PSA) velocity 06/22/2011  . PAH (pulmonary artery hypertension) ([facility])     Social History   Tobacco Use  . Smoking status: Current Every Day Smoker    Packs/day: 0.25    Types: Cigarettes  . Smokeless tobacco: Never Used  Vaping Use  . Vaping Use: Never used  Substance Use Topics  . Alcohol use: Yes    Comment: 1 liquor drink per day   . Drug use: No    Family History  Problem Relation Age of Onset  . Stroke Mother   . Diabetes Father   . Hypertension Sister   . Kidney failure Brother        dialysis  . Diabetes Paternal Grandfather   . Anuerysm Brother   . Colon cancer Neg Hx   . Esophageal cancer Neg Hx   . Rectal cancer Neg Hx   . Stomach cancer Neg Hx    No Known  Allergies  OBJECTIVE: Vitals:   06/08/20 0906  BP: 134/84  Pulse: (!) 130  Temp: 98.4 F (36.9 C)  TempSrc: Oral  Weight: 144 lb (65.3 kg)   Body mass index is 20.66 kg/m.   Physical Exam Constitutional:      Comments: He is very pleasant and in no distress.  Musculoskeletal:     Comments: Has ulcerations over the tip of both great toes.  These are unchanged from his previous exam.  There is no significant drainage or malodor.  There is no surrounding cellulitis or fluctuance.  There is no exposed bone.     Sed Rate (mm/h)  Date Value  04/18/2020 2   CRP (mg/L)  Date Value  05/02/2020 1.6  04/18/2020 25.0 (H)    Microbiology: No results found for this or any previous visit (from the past 240 hour(s)).  Michel Bickers, MD [facility] [place] [facility] [phone] pager   [phone] cell 06/08/2020, 9:26 AM

## 2020-06-08 NOTE — Assessment & Plan Note (Signed)
His inflammatory markers normalized on antibiotic therapy.  I am hopeful that his osteomyelitis has been cured.  He will follow-up here in 6 weeks.

## 2020-06-16 ENCOUNTER — Ambulatory Visit (INDEPENDENT_AMBULATORY_CARE_PROVIDER_SITE_OTHER): Payer: BC Managed Care – PPO | Admitting: Podiatry

## 2020-06-16 ENCOUNTER — Other Ambulatory Visit: Payer: Self-pay

## 2020-06-16 DIAGNOSIS — L97514 Non-pressure chronic ulcer of other part of right foot with necrosis of bone: Secondary | ICD-10-CM

## 2020-06-16 DIAGNOSIS — L97524 Non-pressure chronic ulcer of other part of left foot with necrosis of bone: Secondary | ICD-10-CM | POA: Diagnosis not present

## 2020-06-16 DIAGNOSIS — I739 Peripheral vascular disease, unspecified: Secondary | ICD-10-CM | POA: Diagnosis not present

## 2020-06-16 DIAGNOSIS — M869 Osteomyelitis, unspecified: Secondary | ICD-10-CM | POA: Diagnosis not present

## 2020-06-17 NOTE — Progress Notes (Signed)
°  Subjective:  Patient ID: Joshua Curtis, male    DOB: October 21, 1950,  MRN: [id number]  Chief Complaint  Patient presents with   Foot Pain    pt is here for a possible wound check of bil great toes.    69 y.o. male presents with the above complaint. History confirmed with patient.  Has an upcoming visit with vascular surgery on 10/25.  He is applying Santyl ointment at home.  Objective:  Physical Exam: Nonpalpable pedal pulses, delayed capillary fill time, bilateral hallux punctate ulcerations measuring 1.5 cm x 1 cm x 0.3 cm on the right and 1 cm x 1 cm x 2.3 cm on the left.  Fibrotic wound base.      Assessment:   1. PAD (peripheral artery disease) ([place])   2. Osteomyelitis of great toe ([place])   3. Toe ulcer, left, with necrosis of bone ([place])   4. Toe ulcer, right, with necrosis of bone ([place])      Plan:  Patient was evaluated and treated and all questions answered.   -Dressing applied consisting of povidone -Offload ulcer with surgical shoe -Wound cleansed and debrided -Continue Santyl administration at home -He has an upcoming appointment with vascular surgery on 06/26/2020, will await their recommendations   Procedure: Selective Debridement of Wound Rationale: Removal of devitalized tissue from the wound to promote healing.  Pre-Debridement Wound Measurements: 1.5 cm x 1 cm x 0.3 cm on the right and 1 cm x 1 cm x 2.3 cm on the left Post-Debridement Wound Measurements: same as pre-debridement. Type of Debridement: sharp selective Tissue Removed: Devitalized soft-tissue Dressing: Dry, sterile, compression dressing. Disposition: Patient tolerated procedure well.    Return in about 2 weeks (around 06/30/2020) for wound re-check with Dr Jacqualyn Posey.

## 2020-06-26 ENCOUNTER — Other Ambulatory Visit: Payer: Self-pay

## 2020-06-26 ENCOUNTER — Ambulatory Visit (INDEPENDENT_AMBULATORY_CARE_PROVIDER_SITE_OTHER): Payer: BC Managed Care – PPO | Admitting: Surgery

## 2020-06-26 ENCOUNTER — Encounter: Payer: Self-pay | Admitting: Surgery

## 2020-06-26 VITALS — BP 118/76 | HR 97 | Temp 98.5°F | Resp 20 | Ht 70.0 in | Wt 146.0 lb

## 2020-06-26 DIAGNOSIS — I7025 Atherosclerosis of native arteries of other extremities with ulceration: Secondary | ICD-10-CM

## 2020-06-26 NOTE — H&P (View-Only) (Signed)
[facility] [place]  Patient name: Joshua Curtis MRN: [id number] DOB: [birth year] Sex: male   REQUESTING PROVIDER:    Dr. Jenny Reichmann   REASON FOR CONSULT:    PAD with bilateral ulcers  HISTORY OF PRESENT ILLNESS:   Joshua Curtis is a 69 y.o. male, who is referred for evaluation of bilateral great toe ulcers.  The patient states that they have been present for approximately 2 months.  He is using topical dressing changes daily.  He also reports approximately a 1 block history of a calf claudication symptoms.  The patient is a current smoker.  He is trying to quit.  He suffers from COPD and is not on oxygen.  He is medically managed for hypertension.  He does not take a statin.  He does take a baby aspirin.  PAST MEDICAL HISTORY    Past Medical History:  Diagnosis Date   Anemia, iron deficiency 04/02/2016   Aortic atherosclerosis ([facility])    Arthritis    rt knee   BPH (benign prostatic hyperplasia) 04/10/2017   COLONIC POLYPS, HX OF 11/24/2007   COPD (chronic obstructive pulmonary disease) ([place])    pt denies this- no respiratory issues of any kind per pt   Coronary artery calcification seen on CAT scan    DIVERTICULOSIS, COLON 11/24/2007   ED (erectile dysfunction)    GERD (gastroesophageal reflux disease)    HYPERTENSION 11/24/2007   Increased prostate specific antigen (PSA) velocity 06/22/2011   PAH (pulmonary artery hypertension) ([place])      FAMILY HISTORY   Family History  Problem Relation Age of Onset   Stroke Mother    Diabetes Father    Hypertension Sister    Kidney failure Brother        dialysis   Diabetes Paternal Grandfather    Anuerysm Brother    Colon cancer Neg Hx    Esophageal cancer Neg Hx    Rectal cancer Neg Hx    Stomach cancer Neg Hx     SOCIAL HISTORY:   Social History   Socioeconomic History   Marital status: Married    Spouse name: Not on file   Number of  children: 2   Years of education: Not on file   Highest education level: Not on file  Occupational History   Not on file  Tobacco Use   Smoking status: Current Every Day Smoker    Packs/day: 0.25    Types: Cigarettes   Smokeless tobacco: Never Used  Vaping Use   Vaping Use: Never used  Substance and Sexual Activity   Alcohol use: Yes    Comment: 1 liquor drink per day    Drug use: No   Sexual activity: Not on file  Other Topics Concern   Not on file  Social History Narrative   Not on file   Social Determinants of Health   Financial Resource Strain:    Difficulty of Paying Living Expenses: Not on file  Food Insecurity:    Worried About Charity fundraiser in the Last Year: Not on file   [employer] of Food in the Last Year: Not on file  Transportation Needs:    Lack of Transportation (Medical): Not on file   Lack of Transportation (Non-Medical): Not on file  Physical Activity:    Days of Exercise per Week: Not on file   Minutes of Exercise per Session: Not on file  Stress:    Feeling of Stress : Not on file  Social Connections:    Frequency of Communication with Friends and Family: Not on file   Frequency of Social Gatherings with Friends and Family: Not on file   Attends Religious Services: Not on Electrical engineer or Organizations: Not on file   Attends Archivist Meetings: Not on file   Marital Status: Not on file  Intimate Partner Violence:    Fear of Current or Ex-Partner: Not on file   Emotionally Abused: Not on file   Physically Abused: Not on file   Sexually Abused: Not on file    ALLERGIES:    No Known Allergies  CURRENT MEDICATIONS:    Current Outpatient Medications  Medication Sig Dispense Refill   amLODipine-benazepril (LOTREL) 10-40 MG capsule TAKE 1 CAPSULE BY MOUTH  DAILY 90 capsule 3   aspirin 81 MG tablet Take 81 mg by mouth daily.       collagenase (SANTYL) ointment Apply 1 application  topically daily. 15 g 0   doxycycline (DORYX) 100 MG EC tablet Take 100 mg by mouth 2 (two) times daily.     ferrous sulfate 325 (65 FE) MG tablet Take 1 tablet (325 mg total) by mouth daily with breakfast. 90 tablet 0   hydrochlorothiazide (MICROZIDE) 12.5 MG capsule TAKE 1 CAPSULE BY MOUTH  DAILY 90 capsule 3   meloxicam (MOBIC) 15 MG tablet TAKE 1 TABLET BY MOUTH EVERY DAY 90 tablet 1   methocarbamol (ROBAXIN) 500 MG tablet Take 1 tablet (500 mg total) by mouth every 8 (eight) hours as needed. 30 tablet 0   omeprazole (PRILOSEC) 40 MG capsule Take 1 capsule (40 mg total) by mouth in the morning and at bedtime. Please schedule and office visit for further refills. Thank you 60 capsule 1   SUTAB [phone] MG TABS See admin instructions.      traMADol (ULTRAM) 50 MG tablet Take 1 tablet (50 mg total) by mouth every 6 (six) hours as needed. 30 tablet 0   No current facility-administered medications for this visit.    REVIEW OF SYSTEMS:   [X]  denotes positive finding, [ ]  denotes negative finding Cardiac  Comments:  Chest pain or chest pressure:    Shortness of breath upon exertion:    Short of breath when lying flat:    Irregular heart rhythm:        Vascular    Pain in calf, thigh, or hip brought on by ambulation: x   Pain in feet at night that wakes you up from your sleep:     Blood clot in your veins:    Leg swelling:         Pulmonary    Oxygen at home:    Productive cough:     Wheezing:         Neurologic    Sudden weakness in arms or legs:     Sudden numbness in arms or legs:     Sudden onset of difficulty speaking or slurred speech:    Temporary loss of vision in one eye:     Problems with dizziness:         Gastrointestinal    Blood in stool:      Vomited blood:         Genitourinary    Burning when urinating:     Blood in urine:        Psychiatric    Major depression:         Hematologic  Bleeding problems:    Problems with blood clotting too  easily:        Skin    Rashes or ulcers: x       Constitutional    Fever or chills:     PHYSICAL EXAM:   Vitals:   06/26/20 1046  BP: 118/76  Pulse: 97  Resp: 20  Temp: 98.5 F (36.9 C)  SpO2: 97%  Weight: 146 lb (66.2 kg)  Height: 5\' 10"  (1.778 m)    GENERAL: The patient is a well-nourished male, in no acute distress. The vital signs are documented above. CARDIAC: There is a regular rate and rhythm.  VASCULAR: Palpable femoral pulses bilaterally.  Nonpalpable pedal pulses PULMONARY: Nonlabored respirations ABDOMEN: Soft and non-tender with normal pitched bowel sounds.  MUSCULOSKELETAL: There are no major deformities or cyanosis. NEUROLOGIC: No focal weakness or paresthesias are detected. SKIN: See photo below PSYCHIATRIC: The patient has a normal affect.    STUDIES:   I have reviewed his u/s results: +-------+-----------+-----------+------------+------------+   ABI/TBI Today's ABI Today's TBI Previous ABI Previous TBI   +-------+-----------+-----------+------------+------------+   Right  .51     0                      +-------+-----------+-----------+------------+------------+   Left   .55     0                      +-------+-----------+-----------+------------+------------+  Right: Total occlusion noted in the popliteal artery. Total occlusion  noted in the tibio-peroneal trunk. Occlusion of the posterior tibial  artery. Reconstituted vs. collateral flow noted in the peroneal and  anterior tibial arteries.   Left: Total occlusion noted in the popliteal artery. Total occlusion noted  in the tibio-peroneal trunk. Segmental occlusion of the anterior tibial  artery. Reconstituted vs. collateral flow seen in the posterior tibial and  peroneal arteries.  ASSESSMENT and PLAN   Lower extremity atherosclerotic vascular disease with bilateral great toe ulcers: I discussed with the patient that this is a limb threatening  situation.  Duplex shows popliteal and tibioperoneal trunk occlusion.  I feel that he needs revascularization for limb salvage.  I am scheduling him for angiography on Tuesday, November 2.  This will be through a right femoral approach with aortogram and bilateral runoff and intervention on the left leg if indicated.  Because of the popliteal and tibioperoneal trunk occlusion, there is a high possibility that the patient will need surgical revascularization.  This was discussed with the patient and he is willing to proceed.   Leia Alf, MD, FACS [facility] [phone] Pager [phone]

## 2020-06-26 NOTE — H&P (View-Only) (Signed)
[facility] [place]  Patient name: Joshua Curtis MRN: [id number] DOB: 1951/03/24 Sex: male   REQUESTING PROVIDER:    Dr. Jenny Reichmann   REASON FOR CONSULT:    PAD with bilateral ulcers  HISTORY OF PRESENT ILLNESS:   Joshua Curtis is a 69 y.o. male, who is referred for evaluation of bilateral great toe ulcers.  The patient states that they have been present for approximately 2 months.  He is using topical dressing changes daily.  He also reports approximately a 1 block history of a calf claudication symptoms.  The patient is a current smoker.  He is trying to quit.  He suffers from COPD and is not on oxygen.  He is medically managed for hypertension.  He does not take a statin.  He does take a baby aspirin.  PAST MEDICAL HISTORY    Past Medical History:  Diagnosis Date  . Anemia, iron deficiency 04/02/2016  . Aortic atherosclerosis ([place])   . Arthritis    rt knee  . BPH (benign prostatic hyperplasia) 04/10/2017  . COLONIC POLYPS, HX OF 11/24/2007  . COPD (chronic obstructive pulmonary disease) ([place])    pt denies this- no respiratory issues of any kind per pt  . Coronary artery calcification seen on CAT scan   . DIVERTICULOSIS, COLON 11/24/2007  . ED (erectile dysfunction)   . GERD (gastroesophageal reflux disease)   . HYPERTENSION 11/24/2007  . Increased prostate specific antigen (PSA) velocity 06/22/2011  . PAH (pulmonary artery hypertension) ([facility])      FAMILY HISTORY   Family History  Problem Relation Age of Onset  . Stroke Mother   . Diabetes Father   . Hypertension Sister   . Kidney failure Brother        dialysis  . Diabetes Paternal Grandfather   . Anuerysm Brother   . Colon cancer Neg Hx   . Esophageal cancer Neg Hx   . Rectal cancer Neg Hx   . Stomach cancer Neg Hx     SOCIAL HISTORY:   Social History   Socioeconomic History  . Marital status: Married    Spouse name: Not on file  . Number of  children: 2  . Years of education: Not on file  . Highest education level: Not on file  Occupational History  . Not on file  Tobacco Use  . Smoking status: Current Every Day Smoker    Packs/day: 0.25    Types: Cigarettes  . Smokeless tobacco: Never Used  Vaping Use  . Vaping Use: Never used  Substance and Sexual Activity  . Alcohol use: Yes    Comment: 1 liquor drink per day   . Drug use: No  . Sexual activity: Not on file  Other Topics Concern  . Not on file  Social History Narrative  . Not on file   Social Determinants of Health   Financial Resource Strain:   . Difficulty of Paying Living Expenses: Not on file  Food Insecurity:   . Worried About Charity fundraiser in the Last Year: Not on file  . Ran Out of Food in the Last Year: Not on file  Transportation Needs:   . Lack of Transportation (Medical): Not on file  . Lack of Transportation (Non-Medical): Not on file  Physical Activity:   . Days of Exercise per Week: Not on file  . Minutes of Exercise per Session: Not on file  Stress:   . Feeling of Stress : Not on file  Social Connections:   . Frequency of Communication with Friends and Family: Not on file  . Frequency of Social Gatherings with Friends and Family: Not on file  . Attends Religious Services: Not on file  . Active Member of Clubs or Organizations: Not on file  . Attends Archivist Meetings: Not on file  . Marital Status: Not on file  Intimate Partner Violence:   . Fear of Current or Ex-Partner: Not on file  . Emotionally Abused: Not on file  . Physically Abused: Not on file  . Sexually Abused: Not on file    ALLERGIES:    No Known Allergies  CURRENT MEDICATIONS:    Current Outpatient Medications  Medication Sig Dispense Refill  . amLODipine-benazepril (LOTREL) 10-40 MG capsule TAKE 1 CAPSULE BY MOUTH  DAILY 90 capsule 3  . aspirin 81 MG tablet Take 81 mg by mouth daily.      . collagenase (SANTYL) ointment Apply 1 application  topically daily. 15 g 0  . doxycycline (DORYX) 100 MG EC tablet Take 100 mg by mouth 2 (two) times daily.    . ferrous sulfate 325 (65 FE) MG tablet Take 1 tablet (325 mg total) by mouth daily with breakfast. 90 tablet 0  . hydrochlorothiazide (MICROZIDE) 12.5 MG capsule TAKE 1 CAPSULE BY MOUTH  DAILY 90 capsule 3  . meloxicam (MOBIC) 15 MG tablet TAKE 1 TABLET BY MOUTH EVERY DAY 90 tablet 1  . methocarbamol (ROBAXIN) 500 MG tablet Take 1 tablet (500 mg total) by mouth every 8 (eight) hours as needed. 30 tablet 0  . omeprazole (PRILOSEC) 40 MG capsule Take 1 capsule (40 mg total) by mouth in the morning and at bedtime. Please schedule and office visit for further refills. Thank you 60 capsule 1  . SUTAB [phone] MG TABS See admin instructions.     . traMADol (ULTRAM) 50 MG tablet Take 1 tablet (50 mg total) by mouth every 6 (six) hours as needed. 30 tablet 0   No current facility-administered medications for this visit.    REVIEW OF SYSTEMS:   [X]  denotes positive finding, [ ]  denotes negative finding Cardiac  Comments:  Chest pain or chest pressure:    Shortness of breath upon exertion:    Short of breath when lying flat:    Irregular heart rhythm:        Vascular    Pain in calf, thigh, or hip brought on by ambulation: x   Pain in feet at night that wakes you up from your sleep:     Blood clot in your veins:    Leg swelling:         Pulmonary    Oxygen at home:    Productive cough:     Wheezing:         Neurologic    Sudden weakness in arms or legs:     Sudden numbness in arms or legs:     Sudden onset of difficulty speaking or slurred speech:    Temporary loss of vision in one eye:     Problems with dizziness:         Gastrointestinal    Blood in stool:      Vomited blood:         Genitourinary    Burning when urinating:     Blood in urine:        Psychiatric    Major depression:         Hematologic  Bleeding problems:    Problems with blood clotting too  easily:        Skin    Rashes or ulcers: x       Constitutional    Fever or chills:     PHYSICAL EXAM:   Vitals:   06/26/20 1046  BP: 118/76  Pulse: 97  Resp: 20  Temp: 98.5 F (36.9 C)  SpO2: 97%  Weight: 146 lb (66.2 kg)  Height: 5\' 10"  (1.778 m)    GENERAL: The patient is a well-nourished male, in no acute distress. The vital signs are documented above. CARDIAC: There is a regular rate and rhythm.  VASCULAR: Palpable femoral pulses bilaterally.  Nonpalpable pedal pulses PULMONARY: Nonlabored respirations ABDOMEN: Soft and non-tender with normal pitched bowel sounds.  MUSCULOSKELETAL: There are no major deformities or cyanosis. NEUROLOGIC: No focal weakness or paresthesias are detected. SKIN: See photo below PSYCHIATRIC: The patient has a normal affect.    STUDIES:   I have reviewed his u/s results: +-------+-----------+-----------+------------+------------+  ABI/TBIToday's ABIToday's TBIPrevious ABIPrevious TBI  +-------+-----------+-----------+------------+------------+  Right .51    0                   +-------+-----------+-----------+------------+------------+  Left  .55    0                   +-------+-----------+-----------+------------+------------+  Right: Total occlusion noted in the popliteal artery. Total occlusion  noted in the tibio-peroneal trunk. Occlusion of the posterior tibial  artery. Reconstituted vs. collateral flow noted in the peroneal and  anterior tibial arteries.   Left: Total occlusion noted in the popliteal artery. Total occlusion noted  in the tibio-peroneal trunk. Segmental occlusion of the anterior tibial  artery. Reconstituted vs. collateral flow seen in the posterior tibial and  peroneal arteries.  ASSESSMENT and PLAN   Lower extremity atherosclerotic vascular disease with bilateral great toe ulcers: I discussed with the patient that this is a limb threatening  situation.  Duplex shows popliteal and tibioperoneal trunk occlusion.  I feel that he needs revascularization for limb salvage.  I am scheduling him for angiography on Tuesday, November 2.  This will be through a right femoral approach with aortogram and bilateral runoff and intervention on the left leg if indicated.  Because of the popliteal and tibioperoneal trunk occlusion, there is a high possibility that the patient will need surgical revascularization.  This was discussed with the patient and he is willing to proceed.   Leia Alf, MD, FACS [facility] [phone] Pager [phone]

## 2020-06-26 NOTE — Progress Notes (Signed)
[facility] [place]  Patient name: Joshua Curtis MRN: [id number] DOB: 05/24/1951 Sex: male   REQUESTING PROVIDER:    Dr. Jenny Reichmann   REASON FOR CONSULT:    PAD with bilateral ulcers  HISTORY OF PRESENT ILLNESS:   Joshua Curtis is a 69 y.o. male, who is referred for evaluation of bilateral great toe ulcers.  The patient states that they have been present for approximately 2 months.  He is using topical dressing changes daily.  He also reports approximately a 1 block history of a calf claudication symptoms.  The patient is a current smoker.  He is trying to quit.  He suffers from COPD and is not on oxygen.  He is medically managed for hypertension.  He does not take a statin.  He does take a baby aspirin.  PAST MEDICAL HISTORY    Past Medical History:  Diagnosis Date  . Anemia, iron deficiency 04/02/2016  . Aortic atherosclerosis ([place])   . Arthritis    rt knee  . BPH (benign prostatic hyperplasia) 04/10/2017  . COLONIC POLYPS, HX OF 11/24/2007  . COPD (chronic obstructive pulmonary disease) ([place])    pt denies this- no respiratory issues of any kind per pt  . Coronary artery calcification seen on CAT scan   . DIVERTICULOSIS, COLON 11/24/2007  . ED (erectile dysfunction)   . GERD (gastroesophageal reflux disease)   . HYPERTENSION 11/24/2007  . Increased prostate specific antigen (PSA) velocity 06/22/2011  . PAH (pulmonary artery hypertension) ([facility])      FAMILY HISTORY   Family History  Problem Relation Age of Onset  . Stroke Mother   . Diabetes Father   . Hypertension Sister   . Kidney failure Brother        dialysis  . Diabetes Paternal Grandfather   . Anuerysm Brother   . Colon cancer Neg Hx   . Esophageal cancer Neg Hx   . Rectal cancer Neg Hx   . Stomach cancer Neg Hx     SOCIAL HISTORY:   Social History   Socioeconomic History  . Marital status: Married    Spouse name: Not on file  . Number of  children: 2  . Years of education: Not on file  . Highest education level: Not on file  Occupational History  . Not on file  Tobacco Use  . Smoking status: Current Every Day Smoker    Packs/day: 0.25    Types: Cigarettes  . Smokeless tobacco: Never Used  Vaping Use  . Vaping Use: Never used  Substance and Sexual Activity  . Alcohol use: Yes    Comment: 1 liquor drink per day   . Drug use: No  . Sexual activity: Not on file  Other Topics Concern  . Not on file  Social History Narrative  . Not on file   Social Determinants of Health   Financial Resource Strain:   . Difficulty of Paying Living Expenses: Not on file  Food Insecurity:   . Worried About Charity fundraiser in the Last Year: Not on file  . Ran Out of Food in the Last Year: Not on file  Transportation Needs:   . Lack of Transportation (Medical): Not on file  . Lack of Transportation (Non-Medical): Not on file  Physical Activity:   . Days of Exercise per Week: Not on file  . Minutes of Exercise per Session: Not on file  Stress:   . Feeling of Stress : Not on file  Social Connections:   . Frequency of Communication with Friends and Family: Not on file  . Frequency of Social Gatherings with Friends and Family: Not on file  . Attends Religious Services: Not on file  . Active Member of Clubs or Organizations: Not on file  . Attends Archivist Meetings: Not on file  . Marital Status: Not on file  Intimate Partner Violence:   . Fear of Current or Ex-Partner: Not on file  . Emotionally Abused: Not on file  . Physically Abused: Not on file  . Sexually Abused: Not on file    ALLERGIES:    No Known Allergies  CURRENT MEDICATIONS:    Current Outpatient Medications  Medication Sig Dispense Refill  . amLODipine-benazepril (LOTREL) 10-40 MG capsule TAKE 1 CAPSULE BY MOUTH  DAILY 90 capsule 3  . aspirin 81 MG tablet Take 81 mg by mouth daily.      . collagenase (SANTYL) ointment Apply 1 application  topically daily. 15 g 0  . doxycycline (DORYX) 100 MG EC tablet Take 100 mg by mouth 2 (two) times daily.    . ferrous sulfate 325 (65 FE) MG tablet Take 1 tablet (325 mg total) by mouth daily with breakfast. 90 tablet 0  . hydrochlorothiazide (MICROZIDE) 12.5 MG capsule TAKE 1 CAPSULE BY MOUTH  DAILY 90 capsule 3  . meloxicam (MOBIC) 15 MG tablet TAKE 1 TABLET BY MOUTH EVERY DAY 90 tablet 1  . methocarbamol (ROBAXIN) 500 MG tablet Take 1 tablet (500 mg total) by mouth every 8 (eight) hours as needed. 30 tablet 0  . omeprazole (PRILOSEC) 40 MG capsule Take 1 capsule (40 mg total) by mouth in the morning and at bedtime. Please schedule and office visit for further refills. Thank you 60 capsule 1  . SUTAB [phone] MG TABS See admin instructions.     . traMADol (ULTRAM) 50 MG tablet Take 1 tablet (50 mg total) by mouth every 6 (six) hours as needed. 30 tablet 0   No current facility-administered medications for this visit.    REVIEW OF SYSTEMS:   [X]  denotes positive finding, [ ]  denotes negative finding Cardiac  Comments:  Chest pain or chest pressure:    Shortness of breath upon exertion:    Short of breath when lying flat:    Irregular heart rhythm:        Vascular    Pain in calf, thigh, or hip brought on by ambulation: x   Pain in feet at night that wakes you up from your sleep:     Blood clot in your veins:    Leg swelling:         Pulmonary    Oxygen at home:    Productive cough:     Wheezing:         Neurologic    Sudden weakness in arms or legs:     Sudden numbness in arms or legs:     Sudden onset of difficulty speaking or slurred speech:    Temporary loss of vision in one eye:     Problems with dizziness:         Gastrointestinal    Blood in stool:      Vomited blood:         Genitourinary    Burning when urinating:     Blood in urine:        Psychiatric    Major depression:         Hematologic  Bleeding problems:    Problems with blood clotting too  easily:        Skin    Rashes or ulcers: x       Constitutional    Fever or chills:     PHYSICAL EXAM:   Vitals:   06/26/20 1046  BP: 118/76  Pulse: 97  Resp: 20  Temp: 98.5 F (36.9 C)  SpO2: 97%  Weight: 146 lb (66.2 kg)  Height: 5\' 10"  (1.778 m)    GENERAL: The patient is a well-nourished male, in no acute distress. The vital signs are documented above. CARDIAC: There is a regular rate and rhythm.  VASCULAR: Palpable femoral pulses bilaterally.  Nonpalpable pedal pulses PULMONARY: Nonlabored respirations ABDOMEN: Soft and non-tender with normal pitched bowel sounds.  MUSCULOSKELETAL: There are no major deformities or cyanosis. NEUROLOGIC: No focal weakness or paresthesias are detected. SKIN: See photo below PSYCHIATRIC: The patient has a normal affect.    STUDIES:   I have reviewed his u/s results: +-------+-----------+-----------+------------+------------+  ABI/TBIToday's ABIToday's TBIPrevious ABIPrevious TBI  +-------+-----------+-----------+------------+------------+  Right .51    0                   +-------+-----------+-----------+------------+------------+  Left  .55    0                   +-------+-----------+-----------+------------+------------+  Right: Total occlusion noted in the popliteal artery. Total occlusion  noted in the tibio-peroneal trunk. Occlusion of the posterior tibial  artery. Reconstituted vs. collateral flow noted in the peroneal and  anterior tibial arteries.   Left: Total occlusion noted in the popliteal artery. Total occlusion noted  in the tibio-peroneal trunk. Segmental occlusion of the anterior tibial  artery. Reconstituted vs. collateral flow seen in the posterior tibial and  peroneal arteries.  ASSESSMENT and PLAN   Lower extremity atherosclerotic vascular disease with bilateral great toe ulcers: I discussed with the patient that this is a limb threatening  situation.  Duplex shows popliteal and tibioperoneal trunk occlusion.  I feel that he needs revascularization for limb salvage.  I am scheduling him for angiography on Tuesday, November 2.  This will be through a right femoral approach with aortogram and bilateral runoff and intervention on the left leg if indicated.  Because of the popliteal and tibioperoneal trunk occlusion, there is a high possibility that the patient will need surgical revascularization.  This was discussed with the patient and he is willing to proceed.   Leia Alf, MD, FACS [facility] [phone] Pager [phone]

## 2020-06-30 ENCOUNTER — Other Ambulatory Visit (HOSPITAL_COMMUNITY)
Admission: RE | Admit: 2020-06-30 | Discharge: 2020-06-30 | Disposition: A | Discharge status: Home / Self Care | Payer: BC Managed Care – PPO | Source: Ambulatory Visit | Attending: Surgery | Admitting: Surgery

## 2020-06-30 DIAGNOSIS — Z20822 Contact with and (suspected) exposure to covid-19: Secondary | ICD-10-CM | POA: Diagnosis not present

## 2020-06-30 DIAGNOSIS — Z01812 Encounter for preprocedural laboratory examination: Secondary | ICD-10-CM | POA: Diagnosis not present

## 2020-07-01 LAB — SARS CORONAVIRUS 2 (TAT 6-24 HRS): SARS Coronavirus 2: NEGATIVE

## 2020-07-03 ENCOUNTER — Ambulatory Visit: Payer: BC Managed Care – PPO | Admitting: Podiatry

## 2020-07-04 ENCOUNTER — Encounter (HOSPITAL_COMMUNITY)
Admission: RE | Disposition: A | Discharge status: Home / Self Care | Payer: Self-pay | Source: Home / Self Care | Attending: Surgery

## 2020-07-04 ENCOUNTER — Ambulatory Visit (HOSPITAL_COMMUNITY)
Admission: RE | Admit: 2020-07-04 | Discharge: 2020-07-04 | Disposition: A | Discharge status: Home / Self Care | Payer: BC Managed Care – PPO | Attending: Surgery | Admitting: Surgery

## 2020-07-04 DIAGNOSIS — L97519 Non-pressure chronic ulcer of other part of right foot with unspecified severity: Secondary | ICD-10-CM | POA: Insufficient documentation

## 2020-07-04 DIAGNOSIS — I70213 Atherosclerosis of native arteries of extremities with intermittent claudication, bilateral legs: Secondary | ICD-10-CM | POA: Insufficient documentation

## 2020-07-04 DIAGNOSIS — L97529 Non-pressure chronic ulcer of other part of left foot with unspecified severity: Secondary | ICD-10-CM | POA: Diagnosis not present

## 2020-07-04 DIAGNOSIS — F17210 Nicotine dependence, cigarettes, uncomplicated: Secondary | ICD-10-CM | POA: Insufficient documentation

## 2020-07-04 DIAGNOSIS — I70245 Atherosclerosis of native arteries of left leg with ulceration of other part of foot: Secondary | ICD-10-CM | POA: Diagnosis not present

## 2020-07-04 DIAGNOSIS — I70235 Atherosclerosis of native arteries of right leg with ulceration of other part of foot: Secondary | ICD-10-CM | POA: Insufficient documentation

## 2020-07-04 HISTORY — PX: PERIPHERAL VASCULAR INTERVENTION: CATH118257

## 2020-07-04 HISTORY — PX: ABDOMINAL AORTOGRAM W/LOWER EXTREMITY: CATH118223

## 2020-07-04 LAB — POCT I-STAT, CHEM 8
BUN: 12 mg/dL (ref 8–23)
BUN: 12 mg/dL (ref 8–23)
Calcium, Ion: 1.01 mmol/L — ABNORMAL LOW (ref 1.15–1.40)
Calcium, Ion: 1.09 mmol/L — ABNORMAL LOW (ref 1.15–1.40)
Chloride: 94 mmol/L — ABNORMAL LOW (ref 98–111)
Chloride: 96 mmol/L — ABNORMAL LOW (ref 98–111)
Creatinine, Ser: 1.3 mg/dL — ABNORMAL HIGH (ref 0.61–1.24)
Creatinine, Ser: 1.5 mg/dL — ABNORMAL HIGH (ref 0.61–1.24)
Glucose, Bld: 110 mg/dL — ABNORMAL HIGH (ref 70–99)
Glucose, Bld: 115 mg/dL — ABNORMAL HIGH (ref 70–99)
HCT: 37 % — ABNORMAL LOW (ref 39.0–52.0)
HCT: 38 % — ABNORMAL LOW (ref 39.0–52.0)
Hemoglobin: 12.6 g/dL — ABNORMAL LOW (ref 13.0–17.0)
Hemoglobin: 12.9 g/dL — ABNORMAL LOW (ref 13.0–17.0)
Potassium: 2.5 mmol/L — CL (ref 3.5–5.1)
Potassium: 2.6 mmol/L — CL (ref 3.5–5.1)
Sodium: 138 mmol/L (ref 135–145)
Sodium: 140 mmol/L (ref 135–145)
TCO2: 28 mmol/L (ref 22–32)
TCO2: 30 mmol/L (ref 22–32)

## 2020-07-04 LAB — POCT ACTIVATED CLOTTING TIME
Activated Clotting Time: 208 seconds
Activated Clotting Time: 208 seconds

## 2020-07-04 SURGERY — ABDOMINAL AORTOGRAM W/LOWER EXTREMITY
Anesthesia: LOCAL

## 2020-07-04 MED ORDER — LIDOCAINE HCL (PF) 1 % IJ SOLN
INTRAMUSCULAR | Status: DC | PRN
  Administered 2020-07-04: 15 mL

## 2020-07-04 MED ORDER — HEPARIN SODIUM (PORCINE) 1000 UNIT/ML IJ SOLN
INTRAMUSCULAR | Status: DC | PRN
  Administered 2020-07-04: 7000 [IU] via INTRAVENOUS
  Administered 2020-07-04 (×2): 2000 [IU] via INTRAVENOUS

## 2020-07-04 MED ORDER — MIDAZOLAM HCL 2 MG/2ML IJ SOLN
INTRAMUSCULAR | Status: AC
  Filled 2020-07-04: qty 2

## 2020-07-04 MED ORDER — ROSUVASTATIN CALCIUM 10 MG PO TABS: 10.0000 mg | ORAL_TABLET | Freq: Every day | ORAL | 11 refills | Status: DC

## 2020-07-04 MED ORDER — HEPARIN SODIUM (PORCINE) 1000 UNIT/ML IJ SOLN
INTRAMUSCULAR | Status: AC
  Filled 2020-07-04: qty 1

## 2020-07-04 MED ORDER — ASPIRIN EC 81 MG PO TBEC: 81.0000 mg | DELAYED_RELEASE_TABLET | Freq: Every day | ORAL | Status: DC

## 2020-07-04 MED ORDER — LABETALOL HCL 5 MG/ML IV SOLN: 10.0000 mg | INTRAVENOUS | Status: DC | PRN

## 2020-07-04 MED ORDER — MORPHINE SULFATE (PF) 2 MG/ML IV SOLN: 2.0000 mg | INTRAVENOUS | Status: DC | PRN

## 2020-07-04 MED ORDER — CLOPIDOGREL BISULFATE 75 MG PO TABS: 75.0000 mg | ORAL_TABLET | Freq: Every day | ORAL | Status: DC

## 2020-07-04 MED ORDER — SODIUM CHLORIDE 0.9% FLUSH: 3.0000 mL | INTRAVENOUS | Status: DC | PRN

## 2020-07-04 MED ORDER — LIDOCAINE-EPINEPHRINE 1 %-1:100000 IJ SOLN
INTRAMUSCULAR | Status: AC
  Filled 2020-07-04: qty 1

## 2020-07-04 MED ORDER — SODIUM CHLORIDE 0.9% FLUSH: 3.0000 mL | Freq: Two times a day (BID) | INTRAVENOUS | Status: DC

## 2020-07-04 MED ORDER — CLOPIDOGREL BISULFATE 300 MG PO TABS
ORAL_TABLET | ORAL | Status: DC | PRN
  Administered 2020-07-04: 300 mg via ORAL

## 2020-07-04 MED ORDER — LIDOCAINE HCL (PF) 1 % IJ SOLN
INTRAMUSCULAR | Status: AC
  Filled 2020-07-04: qty 30

## 2020-07-04 MED ORDER — IODIXANOL 320 MG/ML IV SOLN
INTRAVENOUS | Status: DC | PRN
  Administered 2020-07-04: 140 mL

## 2020-07-04 MED ORDER — ACETAMINOPHEN 325 MG PO TABS: 650.0000 mg | ORAL_TABLET | ORAL | Status: DC | PRN

## 2020-07-04 MED ORDER — HEPARIN (PORCINE) IN NACL 1000-0.9 UT/500ML-% IV SOLN
INTRAVENOUS | Status: DC | PRN
  Administered 2020-07-04: 500 mL

## 2020-07-04 MED ORDER — MIDAZOLAM HCL 2 MG/2ML IJ SOLN
INTRAMUSCULAR | Status: DC | PRN
  Administered 2020-07-04: 1 mg via INTRAVENOUS
  Administered 2020-07-04: 2 mg via INTRAVENOUS

## 2020-07-04 MED ORDER — HEPARIN (PORCINE) IN NACL 1000-0.9 UT/500ML-% IV SOLN
INTRAVENOUS | Status: AC
  Filled 2020-07-04: qty 500

## 2020-07-04 MED ORDER — ONDANSETRON HCL 4 MG/2ML IJ SOLN: 4.0000 mg | Freq: Four times a day (QID) | INTRAMUSCULAR | Status: DC | PRN

## 2020-07-04 MED ORDER — SODIUM CHLORIDE 0.9 % IV SOLN: INTRAVENOUS | Status: DC

## 2020-07-04 MED ORDER — HYDRALAZINE HCL 20 MG/ML IJ SOLN: 5.0000 mg | INTRAMUSCULAR | Status: DC | PRN

## 2020-07-04 MED ORDER — FENTANYL CITRATE (PF) 100 MCG/2ML IJ SOLN
INTRAMUSCULAR | Status: DC | PRN
  Administered 2020-07-04: 50 ug via INTRAVENOUS
  Administered 2020-07-04: 25 ug via INTRAVENOUS

## 2020-07-04 MED ORDER — SODIUM CHLORIDE 0.9 % IV SOLN: 250.0000 mL | INTRAVENOUS | Status: DC | PRN

## 2020-07-04 MED ORDER — SODIUM CHLORIDE 0.9 % WEIGHT BASED INFUSION: 1.0000 mL/kg/h | INTRAVENOUS | Status: DC

## 2020-07-04 MED ORDER — CLOPIDOGREL BISULFATE 75 MG PO TABS: 75.0000 mg | ORAL_TABLET | Freq: Every day | ORAL | 11 refills | Status: DC

## 2020-07-04 MED ORDER — CLOPIDOGREL BISULFATE 300 MG PO TABS
ORAL_TABLET | ORAL | Status: AC
  Filled 2020-07-04: qty 1

## 2020-07-04 MED ORDER — OXYCODONE HCL 5 MG PO TABS: 5.0000 mg | ORAL_TABLET | ORAL | Status: DC | PRN

## 2020-07-04 MED ORDER — FENTANYL CITRATE (PF) 100 MCG/2ML IJ SOLN
INTRAMUSCULAR | Status: AC
  Filled 2020-07-04: qty 2

## 2020-07-04 SURGICAL SUPPLY — 31 items
BALLN JADE .018 3.0 X 100 (BALLOONS) ×3
BALLN MUSTANG 5X150X135 (BALLOONS) ×3
BALLOON JADE .018 3.0 X 100 (BALLOONS) IMPLANT
BALLOON MUSTANG 5X150X135 (BALLOONS) IMPLANT
CATH OMNI FLUSH 5F 65CM (CATHETERS) ×1 IMPLANT
CATH QUICKCROSS SUPP .035X90CM (MICROCATHETER) ×1 IMPLANT
CATH SOFT-VU ST 4F 90CM (CATHETERS) ×1 IMPLANT
CLOSURE MYNX CONTROL 6F/7F (Vascular Products) ×1 IMPLANT
DEVICE TORQUE .025-.038 (MISCELLANEOUS) ×1 IMPLANT
ELECT DEFIB PAD ADLT CADENCE (PAD) ×1 IMPLANT
FILTER CO2 0.2 MICRON (VASCULAR PRODUCTS) ×1 IMPLANT
GLIDEWIRE ADV .035X260CM (WIRE) ×1 IMPLANT
GUIDEWIRE ANGLED .035X150CM (WIRE) ×1 IMPLANT
KIT MICROPUNCTURE NIT STIFF (SHEATH) ×1 IMPLANT
KIT PV (KITS) ×3 IMPLANT
RESERVOIR CO2 (VASCULAR PRODUCTS) ×1 IMPLANT
SET FLUSH CO2 (MISCELLANEOUS) ×1 IMPLANT
SHEATH PINNACLE 5F 10CM (SHEATH) ×1 IMPLANT
SHEATH PINNACLE 6F 10CM (SHEATH) ×1 IMPLANT
SHEATH PINNACLE MP 6F 45CM (SHEATH) ×1 IMPLANT
SHEATH PROBE COVER 6X72 (BAG) ×1 IMPLANT
SHIELD RADPAD SCOOP 12X17 (MISCELLANEOUS) ×1 IMPLANT
STENT ELUVIA 6X120X130 (Permanent Stent) ×2 IMPLANT
STENT TIGRIS 5X100X120 (Permanent Stent) ×1 IMPLANT
SYR MEDRAD MARK V 150ML (SYRINGE) ×1 IMPLANT
TAPE VIPERTRACK RADIOPAQ (MISCELLANEOUS) IMPLANT
TAPE VIPERTRACK RADIOPAQUE (MISCELLANEOUS) ×3
TRANSDUCER W/STOPCOCK (MISCELLANEOUS) ×3 IMPLANT
TRAY PV CATH (CUSTOM PROCEDURE TRAY) ×3 IMPLANT
WIRE BENTSON .035X145CM (WIRE) ×1 IMPLANT
WIRE G V18X300CM (WIRE) ×2 IMPLANT

## 2020-07-04 NOTE — Progress Notes (Signed)
Patient and wife was given to give discharge instructions. Both verbalized understanding.

## 2020-07-04 NOTE — Op Note (Signed)
Patient name: Joshua Curtis MRN: [id number] DOB: 08-07-1951 Sex: male  07/04/2020 Pre-operative Diagnosis: Bilateral lower extremity ulcers Post-operative diagnosis:  Same Surgeon:  [name] Procedure Performed:  1.  Ultrasound-guided access, right femoral artery  2.  Abdominal aortogram with CO2  3.  Bilateral lower extremity runoff  4.  Stent, left superficial femoral and popliteal artery  5.  Failed angioplasty, left posterior tibial artery  6.  Conscious sedation, 103 minutes  7.  Closure device, Mynx   Indications: Patient presented to the office with bilateral great toe ulcers.  Noninvasive imaging showed significant lower extremity vascular disease.  He is here for arteriogram and possible intervention  Procedure:  The patient was identified in the holding area and taken to room 8.  The patient was then placed supine on the table and prepped and draped in the usual sterile fashion.  A time out was called.  Conscious sedation was administered with the use of IV fentanyl and Versed under continuous physician and nurse monitoring.  Heart rate, blood pressure, and oxygen saturation were continuously monitored.  Total sedation time was 103 minutes.  Ultrasound was used to evaluate the right common femoral artery.  It was patent .  A digital ultrasound image was acquired.  A micropuncture needle was used to access the right common femoral artery under ultrasound guidance.  An 018 wire was advanced without resistance and a micropuncture sheath was placed.  The 018 wire was removed and a benson wire was placed.  The micropuncture sheath was exchanged for a 5 french sheath.  An omniflush catheter was advanced over the wire to the level of L-1.  An abdominal angiogram was obtained.  Next, using the omniflush catheter and a benson wire, the aortic bifurcation was crossed and the catheter was placed into theleft external iliac artery and left runoff was obtained.  right runoff was performed  via retrograde sheath injections.  Findings:   Aortogram: No significant renal artery stenosis was identified.  The infrarenal abdominal aorta was patent without significant stenosis.  Bilateral common and external iliac arteries are patent throughout their course.  Right Lower Extremity: The right common femoral profundofemoral artery heavily calcified but patent with no stenosis.  The superficial femoral artery occludes at the adductor canal and reconstitutes a below-knee popliteal artery  Left Lower Extremity: The left common femoral and profundofemoral artery are calcified but patent without significant stenosis.  The superficial femoral artery has 2 tandem greater than 95% lesions one in the adductor canal and 1 at the level of the patella.  The below-knee popliteal artery is small in caliber but patent with single-vessel runoff via the peroneal artery which reconstitutes the dorsalis pedis at the ankle.  There is a faint posterior tibial at the ankle as well  Intervention: After the above images were acquired the decision was made to proceed with intervention.  A 6 French 45 cm sheath was placed in the left external iliac artery.  The patient was fully heparinized.  Using a 035 quick cross catheter and a Glidewire advantage, I was able to navigate across both 95% lesions and get the quick cross catheter into the popliteal artery at the joint space.  A contrast injection was performed confirming successful crossing of the lesions.  The Glidewire advantage was replaced.  Because of the calcified nature of the lesions, I had to predilate in order to get the stent in place.  I placed 2 overlapping 6 x 120 Elluvia stents which were  postdilated with a 5 mm balloon.  Completion imaging was performed that showed the stent to be widely patent however there was now a filling defect consistent with atherosclerotic plaque at the level of the joint space distal to the stents.  I felt that this needed to be treated  and so I inserted a 5 x 100 Tigris stent that crossed to the joint space.  It was deployed and then postdilated with a 5 mm balloon.  Completion imaging showed resolution of the stenosis above the knee.  At this point attention was turned towards the tibial disease.  A V-18 wire was then inserted with a 3 x 100 J balloon.  The wire was navigated into the posterior tibial artery.  Several attempts at subintimal recanalization were performed of the posterior tibial artery but were unsuccessful.  Ultimately I decided to stop at this time.  Catheters and wires were removed.  The groin was closed with a minx.  There were no immediate complications  Impression:  #1.  Tandem 95% heavily calcified lesions within the left superficial femoral-popliteal artery successfully stented.  A 5 mm Tigris stent was placed across the joint space followed by 6 x 120 Elluvia   #2  Single-vessel runoff on the left via the peroneal artery  #3  Failed attempt at recanalization of an occluded left posterior tibial artery  #4  Occlusion of the right popliteal artery behind the knee.  The patient will be brought back for an attempt at recanalization    V. [name], M.D., [facility] [place] Office: [phone] Pager:  [phone]

## 2020-07-04 NOTE — Progress Notes (Signed)
Notified Dr Trula Slade of K 2.5.  No new orders

## 2020-07-04 NOTE — Interval H&P Note (Signed)
History and Physical Interval Note:  07/04/2020 2:21 PM  Joshua Curtis  has presented today for surgery, with the diagnosis of ulceration.  The various methods of treatment have been discussed with the patient and family. After consideration of risks, benefits and other options for treatment, the patient has consented to  Procedure(s): ABDOMINAL AORTOGRAM W/LOWER EXTREMITY (N/A) as a surgical intervention.  The patient's history has been reviewed, patient examined, no change in status, stable for surgery.  I have reviewed the patient's chart and labs.  Questions were answered to the patient's satisfaction.     [name]

## 2020-07-04 NOTE — Discharge Instructions (Signed)
Femoral Site Care This sheet gives you information about how to care for yourself after your procedure. Your health care provider may also give you more specific instructions. If you have problems or questions, contact your health care provider. What can I expect after the procedure? After the procedure, it is common to have:  Bruising that usually fades within 1-2 weeks.  Tenderness at the site. Follow these instructions at home: Wound care  Follow instructions from your health care provider about how to take care of your insertion site. Make sure you: ? Wash your hands with soap and water before you change your bandage (dressing). If soap and water are not available, use hand sanitizer. ? Change your dressing as told by your health care provider. ? Leave stitches (sutures), skin glue, or adhesive strips in place. These skin closures may need to stay in place for 2 weeks or longer. If adhesive strip edges start to loosen and curl up, you may trim the loose edges. Do not remove adhesive strips completely unless your health care provider tells you to do that.  Do not take baths, swim, or use a hot tub until your health care provider approves.  You may shower 24-48 hours after the procedure or as told by your health care provider. ? Gently wash the site with plain soap and water. ? Pat the area dry with a clean towel. ? Do not rub the site. This may cause bleeding.  Do not apply powder or lotion to the site. Keep the site clean and dry.  Check your femoral site every day for signs of infection. Check for: ? Redness, swelling, or pain. ? Fluid or blood. ? Warmth. ? Pus or a bad smell. Activity  For the first 2-3 days after your procedure, or as long as directed: ? Avoid climbing stairs as much as possible. ? Do not squat.  Do not lift anything that is heavier than 10 lb (4.5 kg), or the limit that you are told, until your health care provider says that it is safe.  Rest as  directed. ? Avoid sitting for a long time without moving. Get up to take short walks every 1-2 hours.  Do not drive for 24 hours if you were given a medicine to help you relax (sedative). General instructions  Take over-the-counter and prescription medicines only as told by your health care provider.  Keep all follow-up visits as told by your health care provider. This is important. Contact a health care provider if you have:  A fever or chills.  You have redness, swelling, or pain around your insertion site. Get help right away if:  The catheter insertion area swells very fast.  You pass out.  You suddenly start to sweat or your skin gets clammy.  The catheter insertion area is bleeding, and the bleeding does not stop when you hold steady pressure on the area.  The area near or just beyond the catheter insertion site becomes pale, cool, tingly, or numb. These symptoms may represent a serious problem that is an emergency. Do not wait to see if the symptoms will go away. Get medical help right away. Call your local emergency services (911 in the [place] Do not drive yourself to the hospital. Summary  After the procedure, it is common to have bruising that usually fades within 1-2 weeks.  Check your femoral site every day for signs of infection.  Do not lift anything that is heavier than 10 lb (4.5 kg), or the   limit that you are told, until your health care provider says that it is safe. This information is not intended to replace advice given to you by your health care provider. Make sure you discuss any questions you have with your health care provider. Document Revised: 09/01/2017 Document Reviewed: 09/01/2017 Elsevier Patient Education  2020 [employer]  

## 2020-07-05 ENCOUNTER — Encounter (HOSPITAL_COMMUNITY): Payer: Self-pay | Admitting: Surgery

## 2020-07-10 DIAGNOSIS — N401 Enlarged prostate with lower urinary tract symptoms: Secondary | ICD-10-CM | POA: Diagnosis not present

## 2020-07-10 DIAGNOSIS — R351 Nocturia: Secondary | ICD-10-CM | POA: Diagnosis not present

## 2020-07-11 ENCOUNTER — Other Ambulatory Visit: Payer: Self-pay

## 2020-07-13 ENCOUNTER — Other Ambulatory Visit: Payer: Self-pay

## 2020-07-13 ENCOUNTER — Ambulatory Visit (INDEPENDENT_AMBULATORY_CARE_PROVIDER_SITE_OTHER): Payer: BC Managed Care – PPO | Admitting: Podiatry

## 2020-07-13 DIAGNOSIS — I739 Peripheral vascular disease, unspecified: Secondary | ICD-10-CM

## 2020-07-13 DIAGNOSIS — L97514 Non-pressure chronic ulcer of other part of right foot with necrosis of bone: Secondary | ICD-10-CM | POA: Diagnosis not present

## 2020-07-13 DIAGNOSIS — L97524 Non-pressure chronic ulcer of other part of left foot with necrosis of bone: Secondary | ICD-10-CM

## 2020-07-13 NOTE — Progress Notes (Signed)
Subjective: 69 year old male presents the office today for wound check of bilateral big toes.  He recently underwent injured his left side and scheduled for the right side next week.  He is continue with Santyl dressing changes.  He states the left side is doing better and he has no pain the left side he is having pain to the right.  Denies any drainage or pus any swelling or redness. Denies any systemic complaints such as fevers, chills, nausea, vomiting. No acute changes since last appointment, and no other complaints at this time.   Angio 07/04/2020 Impression:               #1.  Tandem 95% heavily calcified lesions within the left superficial femoral-popliteal artery successfully stented.  A 5 mm Tigris stent was placed across the joint space followed by 6 x 120 Elluvia                #2  Single-vessel runoff on the left via the peroneal artery               #3  Failed attempt at recanalization of an occluded left posterior tibial artery               #4  Occlusion of the right popliteal artery behind the knee.  The patient will be brought back for an attempt at recanalization   Objective: AAO x3, NAD Pulses decreased bilaterally.   Ulcerations within the distal aspects of bilateral hallux.  See picture below.  There is no surrounding erythema, ascending cellulitis but there is no drainage or pus any signs of infection.  Fibrotic aspect of the wound centrally with granulation tissue in the periphery.  There is no probing to bone, undermining or tunneling.  Tenderness palpation directly of the ulcers. No pain with calf compression, swelling, warmth, erythema      Assessment: 69 year old male with ulcerations bilaterally with PAD  Plan: -All treatment options discussed with the patient including all alternatives, risks, complications.  -Likely debrided the wounds today without any complications.  Wound continue with Santyl dressing changes daily.  Continue offloading.  He is wearing very  tight socks today and I advised against this. -Awaiting angio on right side. -Patient encouraged to call the office with any questions, concerns, change in symptoms.   Trula Slade DPM

## 2020-07-14 ENCOUNTER — Other Ambulatory Visit (HOSPITAL_COMMUNITY)
Admission: RE | Admit: 2020-07-14 | Discharge: 2020-07-14 | Disposition: A | Discharge status: Home / Self Care | Payer: BC Managed Care – PPO | Source: Ambulatory Visit | Attending: Surgery | Admitting: Surgery

## 2020-07-14 DIAGNOSIS — Z20822 Contact with and (suspected) exposure to covid-19: Secondary | ICD-10-CM | POA: Diagnosis not present

## 2020-07-14 DIAGNOSIS — Z01812 Encounter for preprocedural laboratory examination: Secondary | ICD-10-CM | POA: Insufficient documentation

## 2020-07-14 LAB — SARS CORONAVIRUS 2 (TAT 6-24 HRS): SARS Coronavirus 2: NEGATIVE

## 2020-07-17 DIAGNOSIS — R351 Nocturia: Secondary | ICD-10-CM | POA: Diagnosis not present

## 2020-07-17 DIAGNOSIS — N401 Enlarged prostate with lower urinary tract symptoms: Secondary | ICD-10-CM | POA: Diagnosis not present

## 2020-07-17 DIAGNOSIS — R350 Frequency of micturition: Secondary | ICD-10-CM | POA: Diagnosis not present

## 2020-07-17 DIAGNOSIS — R3914 Feeling of incomplete bladder emptying: Secondary | ICD-10-CM | POA: Diagnosis not present

## 2020-07-17 DIAGNOSIS — R972 Elevated prostate specific antigen [PSA]: Secondary | ICD-10-CM | POA: Diagnosis not present

## 2020-07-18 ENCOUNTER — Other Ambulatory Visit: Payer: Self-pay

## 2020-07-18 ENCOUNTER — Encounter (HOSPITAL_COMMUNITY)
Admission: RE | Disposition: A | Discharge status: Home / Self Care | Payer: Self-pay | Source: Home / Self Care | Attending: Surgery

## 2020-07-18 ENCOUNTER — Encounter (HOSPITAL_COMMUNITY): Payer: Self-pay | Admitting: Surgery

## 2020-07-18 ENCOUNTER — Observation Stay (HOSPITAL_COMMUNITY)
Admission: RE | Admit: 2020-07-18 | Discharge: 2020-07-19 | Disposition: A | Discharge status: Home / Self Care | Payer: BC Managed Care – PPO | Attending: Surgery | Admitting: Surgery

## 2020-07-18 ENCOUNTER — Ambulatory Visit (HOSPITAL_COMMUNITY): Payer: BC Managed Care – PPO

## 2020-07-18 DIAGNOSIS — I10 Essential (primary) hypertension: Secondary | ICD-10-CM | POA: Insufficient documentation

## 2020-07-18 DIAGNOSIS — I70235 Atherosclerosis of native arteries of right leg with ulceration of other part of foot: Secondary | ICD-10-CM | POA: Diagnosis not present

## 2020-07-18 DIAGNOSIS — I70245 Atherosclerosis of native arteries of left leg with ulceration of other part of foot: Secondary | ICD-10-CM | POA: Insufficient documentation

## 2020-07-18 DIAGNOSIS — S7001XA Contusion of right hip, initial encounter: Secondary | ICD-10-CM | POA: Diagnosis not present

## 2020-07-18 DIAGNOSIS — Z79899 Other long term (current) drug therapy: Secondary | ICD-10-CM | POA: Insufficient documentation

## 2020-07-18 DIAGNOSIS — M7981 Nontraumatic hematoma of soft tissue: Secondary | ICD-10-CM | POA: Diagnosis not present

## 2020-07-18 DIAGNOSIS — I739 Peripheral vascular disease, unspecified: Secondary | ICD-10-CM | POA: Diagnosis present

## 2020-07-18 DIAGNOSIS — F17210 Nicotine dependence, cigarettes, uncomplicated: Secondary | ICD-10-CM | POA: Insufficient documentation

## 2020-07-18 DIAGNOSIS — J449 Chronic obstructive pulmonary disease, unspecified: Secondary | ICD-10-CM | POA: Diagnosis not present

## 2020-07-18 DIAGNOSIS — Z7982 Long term (current) use of aspirin: Secondary | ICD-10-CM | POA: Diagnosis not present

## 2020-07-18 DIAGNOSIS — S7011XA Contusion of right thigh, initial encounter: Secondary | ICD-10-CM | POA: Diagnosis not present

## 2020-07-18 DIAGNOSIS — L97519 Non-pressure chronic ulcer of other part of right foot with unspecified severity: Secondary | ICD-10-CM | POA: Diagnosis not present

## 2020-07-18 DIAGNOSIS — L97529 Non-pressure chronic ulcer of other part of left foot with unspecified severity: Secondary | ICD-10-CM | POA: Insufficient documentation

## 2020-07-18 DIAGNOSIS — M7989 Other specified soft tissue disorders: Secondary | ICD-10-CM | POA: Diagnosis not present

## 2020-07-18 HISTORY — PX: ABDOMINAL AORTOGRAM W/LOWER EXTREMITY: CATH118223

## 2020-07-18 HISTORY — PX: PERIPHERAL VASCULAR INTERVENTION: CATH118257

## 2020-07-18 HISTORY — PX: PERIPHERAL VASCULAR BALLOON ANGIOPLASTY: CATH118281

## 2020-07-18 LAB — CBC
HCT: 24.7 % — ABNORMAL LOW (ref 39.0–52.0)
HCT: 23.7 % — ABNORMAL LOW (ref 39.0–52.0)
Hemoglobin: 7.9 g/dL — ABNORMAL LOW (ref 13.0–17.0)
Hemoglobin: 8.1 g/dL — ABNORMAL LOW (ref 13.0–17.0)
MCH: 34.2 pg — ABNORMAL HIGH (ref 26.0–34.0)
MCH: 33.3 pg (ref 26.0–34.0)
MCHC: 32 g/dL (ref 30.0–36.0)
MCHC: 34.2 g/dL (ref 30.0–36.0)
MCV: 106.9 fL — ABNORMAL HIGH (ref 80.0–100.0)
MCV: 97.5 fL (ref 80.0–100.0)
Platelets: 384 10*3/uL (ref 150–400)
Platelets: 319 10*3/uL (ref 150–400)
RBC: 2.31 MIL/uL — ABNORMAL LOW (ref 4.22–5.81)
RBC: 2.43 MIL/uL — ABNORMAL LOW (ref 4.22–5.81)
RDW: 19.3 % — ABNORMAL HIGH (ref 11.5–15.5)
RDW: 19.1 % — ABNORMAL HIGH (ref 11.5–15.5)
WBC: 8.2 10*3/uL (ref 4.0–10.5)
WBC: 8.5 10*3/uL (ref 4.0–10.5)
nRBC: 0.4 % — ABNORMAL HIGH (ref 0.0–0.2)
nRBC: 0 % (ref 0.0–0.2)

## 2020-07-18 LAB — POCT ACTIVATED CLOTTING TIME
Activated Clotting Time: 224 seconds
Activated Clotting Time: 213 seconds

## 2020-07-18 LAB — POCT I-STAT, CHEM 8
BUN: 13 mg/dL (ref 8–23)
BUN: 15 mg/dL (ref 8–23)
Calcium, Ion: 1.08 mmol/L — ABNORMAL LOW (ref 1.15–1.40)
Calcium, Ion: 1.1 mmol/L — ABNORMAL LOW (ref 1.15–1.40)
Chloride: 99 mmol/L (ref 98–111)
Chloride: 99 mmol/L (ref 98–111)
Creatinine, Ser: 1 mg/dL (ref 0.61–1.24)
Creatinine, Ser: 1.1 mg/dL (ref 0.61–1.24)
Glucose, Bld: 93 mg/dL (ref 70–99)
Glucose, Bld: 102 mg/dL — ABNORMAL HIGH (ref 70–99)
HCT: 31 % — ABNORMAL LOW (ref 39.0–52.0)
HCT: 22 % — ABNORMAL LOW (ref 39.0–52.0)
Hemoglobin: 10.5 g/dL — ABNORMAL LOW (ref 13.0–17.0)
Hemoglobin: 7.5 g/dL — ABNORMAL LOW (ref 13.0–17.0)
Potassium: 2.7 mmol/L — CL (ref 3.5–5.1)
Potassium: 3.1 mmol/L — ABNORMAL LOW (ref 3.5–5.1)
Sodium: 141 mmol/L (ref 135–145)
Sodium: 142 mmol/L (ref 135–145)
TCO2: 30 mmol/L (ref 22–32)
TCO2: 30 mmol/L (ref 22–32)

## 2020-07-18 LAB — ABO/RH: ABO/RH(D): O POS

## 2020-07-18 LAB — PREPARE RBC (CROSSMATCH)

## 2020-07-18 SURGERY — ABDOMINAL AORTOGRAM W/LOWER EXTREMITY
Anesthesia: LOCAL | Laterality: Right

## 2020-07-18 MED ORDER — LABETALOL HCL 5 MG/ML IV SOLN: 10.0000 mg | INTRAVENOUS | Status: DC | PRN

## 2020-07-18 MED ORDER — ONDANSETRON HCL 4 MG/2ML IJ SOLN: 4.0000 mg | Freq: Four times a day (QID) | INTRAMUSCULAR | Status: DC | PRN

## 2020-07-18 MED ORDER — PHENYLEPHRINE HCL-NACL 10-0.9 MG/250ML-% IV SOLN
0.0000 ug/min | INTRAVENOUS | Status: DC
  Filled 2020-07-18 (×2): qty 250

## 2020-07-18 MED ORDER — INSULIN ASPART 100 UNIT/ML ~~LOC~~ SOLN
SUBCUTANEOUS | Status: AC
  Filled 2020-07-18: qty 1

## 2020-07-18 MED ORDER — HEPARIN SODIUM (PORCINE) 1000 UNIT/ML IJ SOLN
INTRAMUSCULAR | Status: DC | PRN
  Administered 2020-07-18: 7000 [IU] via INTRAVENOUS
  Administered 2020-07-18: 1000 [IU] via INTRAVENOUS
  Administered 2020-07-18: 2000 [IU] via INTRAVENOUS

## 2020-07-18 MED ORDER — SODIUM CHLORIDE 0.9 % IV SOLN: 250.0000 mL | INTRAVENOUS | Status: DC | PRN

## 2020-07-18 MED ORDER — SODIUM CHLORIDE 0.9% FLUSH
3.0000 mL | Freq: Two times a day (BID) | INTRAVENOUS | Status: DC
  Administered 2020-07-18 – 2020-07-19 (×2): 3 mL via INTRAVENOUS

## 2020-07-18 MED ORDER — HEPARIN SODIUM (PORCINE) 1000 UNIT/ML IJ SOLN
INTRAMUSCULAR | Status: AC
  Filled 2020-07-18: qty 1

## 2020-07-18 MED ORDER — LIDOCAINE HCL (PF) 1 % IJ SOLN
INTRAMUSCULAR | Status: AC
  Filled 2020-07-18: qty 30

## 2020-07-18 MED ORDER — HEPARIN (PORCINE) IN NACL 1000-0.9 UT/500ML-% IV SOLN
INTRAVENOUS | Status: AC
  Filled 2020-07-18: qty 1000

## 2020-07-18 MED ORDER — HYDRALAZINE HCL 20 MG/ML IJ SOLN: 5.0000 mg | INTRAMUSCULAR | Status: DC | PRN

## 2020-07-18 MED ORDER — FENTANYL CITRATE (PF) 100 MCG/2ML IJ SOLN
INTRAMUSCULAR | Status: AC
  Filled 2020-07-18: qty 2

## 2020-07-18 MED ORDER — SODIUM CHLORIDE 0.9% IV SOLUTION: Freq: Once | INTRAVENOUS | Status: DC

## 2020-07-18 MED ORDER — SODIUM CHLORIDE 0.9 % IV SOLN: INTRAVENOUS | Status: DC

## 2020-07-18 MED ORDER — HEPARIN (PORCINE) IN NACL 1000-0.9 UT/500ML-% IV SOLN
INTRAVENOUS | Status: DC | PRN
  Administered 2020-07-18 (×2): 500 mL

## 2020-07-18 MED ORDER — MIDAZOLAM HCL 2 MG/2ML IJ SOLN
INTRAMUSCULAR | Status: AC
  Filled 2020-07-18: qty 2

## 2020-07-18 MED ORDER — CLOPIDOGREL BISULFATE 75 MG PO TABS
75.0000 mg | ORAL_TABLET | Freq: Every day | ORAL | Status: DC
  Administered 2020-07-19: 75 mg via ORAL
  Filled 2020-07-18: qty 1

## 2020-07-18 MED ORDER — ASPIRIN EC 81 MG PO TBEC
81.0000 mg | DELAYED_RELEASE_TABLET | Freq: Every day | ORAL | Status: DC
  Administered 2020-07-19: 81 mg via ORAL
  Filled 2020-07-18: qty 1

## 2020-07-18 MED ORDER — FENTANYL CITRATE (PF) 100 MCG/2ML IJ SOLN
INTRAMUSCULAR | Status: DC | PRN
  Administered 2020-07-18: 50 ug via INTRAVENOUS
  Administered 2020-07-18: 25 ug via INTRAVENOUS

## 2020-07-18 MED ORDER — MIDAZOLAM HCL 2 MG/2ML IJ SOLN
INTRAMUSCULAR | Status: DC | PRN
  Administered 2020-07-18 (×2): 1 mg via INTRAVENOUS

## 2020-07-18 MED ORDER — OXYCODONE-ACETAMINOPHEN 5-325 MG PO TABS
1.0000 | ORAL_TABLET | ORAL | Status: DC | PRN
  Administered 2020-07-18 – 2020-07-19 (×3): 1 via ORAL
  Filled 2020-07-18 (×3): qty 1

## 2020-07-18 MED ORDER — LIDOCAINE HCL (PF) 1 % IJ SOLN
INTRAMUSCULAR | Status: DC | PRN
  Administered 2020-07-18: 15 mL via INTRADERMAL

## 2020-07-18 MED ORDER — SODIUM CHLORIDE 0.9 % WEIGHT BASED INFUSION: 1.0000 mL/kg/h | INTRAVENOUS | Status: AC

## 2020-07-18 MED ORDER — SODIUM CHLORIDE 0.9% FLUSH: 3.0000 mL | INTRAVENOUS | Status: DC | PRN

## 2020-07-18 MED ORDER — ACETAMINOPHEN 325 MG PO TABS: 650.0000 mg | ORAL_TABLET | ORAL | Status: DC | PRN

## 2020-07-18 SURGICAL SUPPLY — 25 items
BAG SNAP BAND KOVER 36X36 (MISCELLANEOUS) ×1 IMPLANT
BALLN MUSTANG 4X100X135 (BALLOONS) ×3
BALLN STERLING OTW 3X100X150 (BALLOONS) ×3
BALLOON MUSTANG 4X100X135 (BALLOONS) IMPLANT
BALLOON STERLING OTW 3X100X150 (BALLOONS) IMPLANT
CATH OMNI FLUSH 5F 65CM (CATHETERS) ×1 IMPLANT
CATH QUICKCROSS .035X135CM (MICROCATHETER) ×1 IMPLANT
CLOSURE MYNX CONTROL 6F/7F (Vascular Products) ×1 IMPLANT
COVER DOME SNAP 22 D (MISCELLANEOUS) ×1 IMPLANT
DEVICE TORQUE .025-.038 (MISCELLANEOUS) ×1 IMPLANT
DEVICE TORQUE H2O (MISCELLANEOUS) ×1 IMPLANT
GLIDEWIRE ADV .035X260CM (WIRE) ×2 IMPLANT
KIT ENCORE 26 ADVANTAGE (KITS) ×1 IMPLANT
KIT MICROPUNCTURE NIT STIFF (SHEATH) ×1 IMPLANT
KIT PV (KITS) ×3 IMPLANT
SHEATH PINNACLE 6F 10CM (SHEATH) ×2 IMPLANT
SHEATH PINNACLE ST 6F 45CM (SHEATH) ×1 IMPLANT
SHIELD RADPAD SCOOP 12X17 (MISCELLANEOUS) ×1 IMPLANT
STENT ELUVIA 6X120X130 (Permanent Stent) ×2 IMPLANT
STENT TIGRIS 5X100X120 (Permanent Stent) ×1 IMPLANT
SYR MEDRAD MARK V 150ML (SYRINGE) ×1 IMPLANT
TRANSDUCER W/STOPCOCK (MISCELLANEOUS) ×3 IMPLANT
TRAY PV CATH (CUSTOM PROCEDURE TRAY) ×3 IMPLANT
WIRE BENTSON .035X145CM (WIRE) ×1 IMPLANT
WIRE G V18X300CM (WIRE) ×1 IMPLANT

## 2020-07-18 NOTE — Interval H&P Note (Signed)
History and Physical Interval Note:  07/18/2020 7:52 AM  Joshua Curtis  has presented today for surgery, with the diagnosis of ulceration.  The various methods of treatment have been discussed with the patient and family. After consideration of risks, benefits and other options for treatment, the patient has consented to  Procedure(s): ABDOMINAL AORTOGRAM W/LOWER EXTREMITY (N/A) as a surgical intervention.  The patient's history has been reviewed, patient examined, no change in status, stable for surgery.  I have reviewed the patient's chart and labs.  Questions were answered to the patient's satisfaction.     [name]

## 2020-07-18 NOTE — Op Note (Signed)
Patient name: Joshua Curtis MRN: [id number] DOB: 1950/12/15 Sex: male  07/18/2020 Pre-operative Diagnosis: Right toe ulcer Post-operative diagnosis:  Same Surgeon:  [name] Procedure Performed:  1.  Ultrasound-guided access, left femoral artery  2.  Right leg angiogram  3.  Angioplasty, right tibioperoneal trunk and peroneal artery  4.  Stent, right popliteal and superficial femoral artery  5.  Closure device, Mynx  6.  Conscious sedation, 107 minutes     Indications: The patient has bilateral toe ulcers.  He has previously undergone revascularization of the left leg.  Comes back for the right  Procedure:  The patient was identified in the holding area and taken to room 8.  The patient was then placed supine on the table and prepped and draped in the usual sterile fashion.  A time out was called.  Conscious sedation was administered with the use of IV fentanyl and Versed under continuous physician and nurse monitoring.  Heart rate, blood pressure, and oxygen saturation were continuously monitored.  Total sedation time was 107 minutes.  Ultrasound was used to evaluate the left common femoral artery.  It was patent .  A digital ultrasound image was acquired.  A micropuncture needle was used to access the left common femoral artery under ultrasound guidance.  An 018 wire was advanced without resistance and a micropuncture sheath was placed.  The 018 wire was removed and a benson wire was placed.  The micropuncture sheath was exchanged for a 6 french sheath.  An omniflush catheter was used to cross the aortic bifurcation.  A Glidewire advantage was inserted in the superficial femoral artery and a 6 French 45 cm sheath was placed.  The patient was then fully heparinized.  Contrast injections were performed through the sheath Findings:   Right Lower Extremity: The superficial femoral artery is patent through the adductor canal however there is a 60 to 70% stenosis within the adductor  canal.  The popliteal artery is occluded with reconstitution of the below-knee popliteal artery.  There is single-vessel runoff via the peroneal artery which collateralizes on the foot.  There is a stenosis within the tibioperoneal trunk and peroneal artery, greater than 50%.  Intervention: After above images were acquired the decision made to proceed with intervention.  Using a 035 quick cross and a Glidewire advantage, I was able to advance through the plaque and occlusion with some difficulty.  Reentry was then to the distal popliteal artery.  The wire was navigated into the peroneal artery.  A contrast injection was performed through the quick cross catheter in the peroneal artery to confirm successful crossing of the lesion.  I then inserted a the Dash 18 wire and performed balloon angioplasty of the posterior tibial artery all the way up through the superficial femoral artery.  I then selected a 5 x [place] stent.  I had difficulty getting the Tigris through the occlusion and so it was withdrawn and the occlusion was dilated with a 4 mm Mustang balloon and a 035 wire was inserted.  I then inserted a Tigris stent was able to get it into good position in the below-knee popliteal artery.  Unfortunately, the device did not deploy.  I have difficulty getting it out and therefore had to pull the device and the wire out together.  I then recross the lesion with the 035 quick cross and Glidewire advantage.  A 2nd 5 by [facility] stent was then inserted and then deployed in the below-knee popliteal artery crossing  the joint space up into the above-knee popliteal artery.  This was dilated with a 4 mm balloon.  I then placed 2 overlapping 6 x 120 Elluvia stents in the popliteal and superficial femoral artery which were postdilated with a 5 mm balloon.  Completion imaging showed inline flow through the peroneal artery with no residual stenosis.  Patient tolerated procedure well.  Left groin was successfully closed with a  minx device  Impression:  #1 occlusion of the right popliteal artery and high-grade stenosis of the right superficial femoral artery in the adductor canal with single-vessel runoff.  The origin of the peroneal artery had a greater than 50% stenosis.  #2 successful balloon angioplasty of the posterior tibial tibioperoneal trunk using a 3 mm balloon.  Stenting of the popliteal occlusion and superficial femoral artery stenosis using a 5 x 100 Tigris across the knee followed by overlapping 6 x 120 Elluvia    V. [name], M.D., [facility] [place] Office: [phone] Pager:  [phone]

## 2020-07-18 NOTE — Progress Notes (Signed)
Patient to CT scan

## 2020-07-18 NOTE — Progress Notes (Signed)
Report given to Katie, 4E- room is needing to be cleaned- they will call when ready.

## 2020-07-18 NOTE — Progress Notes (Signed)
Notified Panfilo Ketchum of K level 2.7. Anderson Malta stated she will message Dr. Trula Slade.

## 2020-07-18 NOTE — Significant Event (Signed)
Rapid Response Event Note   Reason for Call :  Patient is sp Right angiogram with angioplasty and stent placed.   Right thigh pain and swelling, hypotension  Initial Focused Assessment:  Patient is alert but nauseated and complaining of pain in his right thigh.  Staff was holding pressure to site. BP 59/44  HR 88   RR 22  O2 sat 99% He is cool to the touch Left groin site clean dry intact no hematoma noted Unable to Doppler pedal pulses  Dr Trula Slade and Dr Donzetta Matters at bedside  BP improved to 107/52-114/53  Interventions:  Manual pressure held to right thigh 1L NS bolus EKG done  CT right leg  Plan of Care:  Rn to call if patient becomes hypotensive again   Event Summary:   MD Notified:  Dr Trula Slade at bedside Call Time: [place] Time: 6606 End Time: 1215  Raliegh Ip, RN

## 2020-07-18 NOTE — Progress Notes (Signed)
Notified Dr Trula Slade of K 2.7. Ok to proceed, no new orders

## 2020-07-18 NOTE — Discharge Instructions (Signed)
   [facility]  Discharge Instructions  Lower Extremity Angiogram; Angioplasty/Stenting  Please refer to the following instructions for your post-procedure care. Your surgeon or physician assistant will discuss any changes with you.  Activity  Avoid lifting more than 8 pounds (1 gallons of milk) for 72 hours (3 days) after your procedure. You may walk as much as you can tolerate. It's OK to drive after 72 hours.  Bathing/Showering  You may shower the day after your procedure. If you have a bandage, you may remove it at 24- 48 hours. Clean your incision site with mild soap and water. Pat the area dry with a clean towel.  Diet  DRINK FLUIDS TO STAY HYDRATED FOR 2-3 DAYS. Resume your pre-procedure diet. There are no special food restrictions following this procedure. All patients with peripheral vascular disease should follow a low fat/low cholesterol diet. In order to heal from your surgery, it is CRITICAL to get adequate nutrition. Your body requires vitamins, minerals, and protein. Vegetables are the best source of vitamins and minerals. Vegetables also provide the perfect balance of protein. Processed food has little nutritional value, so try to avoid this.  Medications  Resume taking all of your medications unless your doctor tells you not to. If your incision is causing pain, you may take over-the-counter pain relievers such as acetaminophen (Tylenol)  Follow Up  Follow up will be arranged at the time of your procedure. You may have an office visit scheduled or may be scheduled for surgery. Ask your surgeon if you have any questions.  Please call us immediately for any of the following conditions: .Severe or worsening pain your legs or feet at rest or with walking. .Increased pain, redness, drainage at your groin puncture site. .Fever of 101 degrees or higher. .If you have any mild or slow bleeding from your puncture site: lie down, apply firm  constant pressure over the area with a piece of gauze or a clean wash cloth for 30 minutes- no peeking!, call 911 right away if you are still bleeding after 30 minutes, or if the bleeding is heavy and unmanageable.  Reduce your risk factors of vascular disease:  Stop smoking. If you would like help call [facility] at 1-800-QUIT-NOW (989)474-2347) or [place] at (215)750-7089. Manage your cholesterol Maintain a desired weight Control your diabetes Keep your blood pressure down  If you have any questions, please call the office at [phone]

## 2020-07-18 NOTE — Progress Notes (Signed)
BP decrease noted at 1015.  Lt groin level 0.denies pain or discomfort to lt groin and ABD. C/o pain rt leg. Swelling present to rt thigh. No pulses by doppler. NS bolus started.  MD paged. Pressure held to rt thigh 1030-1043.

## 2020-07-19 DIAGNOSIS — F17210 Nicotine dependence, cigarettes, uncomplicated: Secondary | ICD-10-CM | POA: Diagnosis not present

## 2020-07-19 DIAGNOSIS — I10 Essential (primary) hypertension: Secondary | ICD-10-CM | POA: Diagnosis not present

## 2020-07-19 DIAGNOSIS — L97519 Non-pressure chronic ulcer of other part of right foot with unspecified severity: Secondary | ICD-10-CM | POA: Diagnosis not present

## 2020-07-19 DIAGNOSIS — Z79899 Other long term (current) drug therapy: Secondary | ICD-10-CM | POA: Diagnosis not present

## 2020-07-19 DIAGNOSIS — L97529 Non-pressure chronic ulcer of other part of left foot with unspecified severity: Secondary | ICD-10-CM | POA: Diagnosis not present

## 2020-07-19 DIAGNOSIS — I70235 Atherosclerosis of native arteries of right leg with ulceration of other part of foot: Secondary | ICD-10-CM | POA: Diagnosis not present

## 2020-07-19 DIAGNOSIS — J449 Chronic obstructive pulmonary disease, unspecified: Secondary | ICD-10-CM | POA: Diagnosis not present

## 2020-07-19 DIAGNOSIS — I70245 Atherosclerosis of native arteries of left leg with ulceration of other part of foot: Secondary | ICD-10-CM | POA: Diagnosis not present

## 2020-07-19 DIAGNOSIS — Z7982 Long term (current) use of aspirin: Secondary | ICD-10-CM | POA: Diagnosis not present

## 2020-07-19 LAB — BASIC METABOLIC PANEL
Anion gap: 7 (ref 5–15)
Anion gap: 8 (ref 5–15)
BUN: 10 mg/dL (ref 8–23)
BUN: 8 mg/dL (ref 8–23)
CO2: 29 mmol/L (ref 22–32)
CO2: 28 mmol/L (ref 22–32)
Calcium: 7.8 mg/dL — ABNORMAL LOW (ref 8.9–10.3)
Calcium: 8.2 mg/dL — ABNORMAL LOW (ref 8.9–10.3)
Chloride: 104 mmol/L (ref 98–111)
Chloride: 101 mmol/L (ref 98–111)
Creatinine, Ser: 1.03 mg/dL (ref 0.61–1.24)
Creatinine, Ser: 1.02 mg/dL (ref 0.61–1.24)
GFR, Estimated: >60 mL/min (ref >60)
GFR, Estimated: >60 mL/min (ref >60)
Glucose, Bld: 106 mg/dL — ABNORMAL HIGH (ref 70–99)
Glucose, Bld: 86 mg/dL (ref 70–99)
Potassium: 2.5 mmol/L — CL (ref 3.5–5.1)
Potassium: 3.2 mmol/L — ABNORMAL LOW (ref 3.5–5.1)
Sodium: 140 mmol/L (ref 135–145)
Sodium: 137 mmol/L (ref 135–145)

## 2020-07-19 LAB — CBC
HCT: 23.4 % — ABNORMAL LOW (ref 39.0–52.0)
Hemoglobin: 7.7 g/dL — ABNORMAL LOW (ref 13.0–17.0)
MCH: 32.6 pg (ref 26.0–34.0)
MCHC: 32.9 g/dL (ref 30.0–36.0)
MCV: 99.2 fL (ref 80.0–100.0)
Platelets: 315 10*3/uL (ref 150–400)
RBC: 2.36 MIL/uL — ABNORMAL LOW (ref 4.22–5.81)
RDW: 19.5 % — ABNORMAL HIGH (ref 11.5–15.5)
WBC: 8 10*3/uL (ref 4.0–10.5)
nRBC: 0.5 % — ABNORMAL HIGH (ref 0.0–0.2)

## 2020-07-19 LAB — MAGNESIUM: Magnesium: 2 mg/dL (ref 1.7–2.4)

## 2020-07-19 LAB — PREPARE RBC (CROSSMATCH)

## 2020-07-19 MED ORDER — POTASSIUM CHLORIDE 10 MEQ/100ML IV SOLN
10.0000 meq | INTRAVENOUS | Status: AC
  Administered 2020-07-19 (×3): 10 meq via INTRAVENOUS
  Filled 2020-07-19 (×3): qty 100

## 2020-07-19 MED ORDER — OXYCODONE-ACETAMINOPHEN 5-325 MG PO TABS: 1.0000 | ORAL_TABLET | ORAL | 0 refills | Status: DC | PRN

## 2020-07-19 NOTE — Progress Notes (Signed)
K+ resulted 3.2, Dr. Trula Slade notified and pt has been cleared for discharge to home.  Orders received.  IV and telemetry removed.  CCMD notified.

## 2020-07-19 NOTE — Progress Notes (Signed)
CRITICAL VALUE ALERT  Critical Value:  Potassium 2.5  Date & Time Notied:  07/19/20 @0400   Provider Notified: Dr. Donnetta Hutching  Orders Received/Actions taken: 59mEqx3 KCL via PIV in 163mL each.

## 2020-07-19 NOTE — Discharge Summary (Signed)
[facility] Discharge Summary   Patient ID:  Joshua Curtis MRN: [id number] DOB/AGE: August 20, 1951 69 y.o.  Admit date: 07/18/2020 Discharge date: 07/20/2020 Date of Surgery: 07/18/2020 Surgeon: Surgeon(s): Serafina Mitchell, MD  Admission Diagnosis: PVD (peripheral vascular disease) [facility]) [I73.9]  Discharge Diagnoses:  PVD (peripheral vascular disease) ([place]) [I73.9]  Secondary Diagnoses: Past Medical History:  Diagnosis Date  . Anemia, iron deficiency 04/02/2016  . Aortic atherosclerosis ([place])   . Arthritis    rt knee  . BPH (benign prostatic hyperplasia) 04/10/2017  . COLONIC POLYPS, HX OF 11/24/2007  . COPD (chronic obstructive pulmonary disease) ([place])    pt denies this- no respiratory issues of any kind per pt  . Coronary artery calcification seen on CAT scan   . DIVERTICULOSIS, COLON 11/24/2007  . ED (erectile dysfunction)   . GERD (gastroesophageal reflux disease)   . HYPERTENSION 11/24/2007  . Increased prostate specific antigen (PSA) velocity 06/22/2011  . PAH (pulmonary artery hypertension) (HCC)     Procedure(s): ABDOMINAL AORTOGRAM W/LOWER EXTREMITY PERIPHERAL VASCULAR INTERVENTION PERIPHERAL VASCULAR BALLOON ANGIOPLASTY  Discharged Condition: good  HPI: Joshua Curtis is a 69 y.o. male, who is referred for evaluation of bilateral great toe ulcers.  The patient states that they have been present for approximately 2 months.  He is using topical dressing changes daily.  He also reports approximately a 1 block history of a calf claudication symptoms.  The patient is a current smoker.  He is trying to quit.  He suffers from COPD and is not on oxygen.  He is medically managed for hypertension.  He does not take a statin.  He does take a baby aspirin.  Lower extremity atherosclerotic vascular disease with bilateral great toe ulcers: I discussed with the patient that this is a limb threatening situation.  Duplex shows popliteal and tibioperoneal trunk  occlusion.  I feel that he needs revascularization for limb salvage.  I am scheduling him for angiography on Tuesday, November 2.  This will be through a right femoral approach with aortogram and bilateral runoff and intervention on the left leg if indicated.  Because of the popliteal and tibioperoneal trunk occlusion, there is a high possibility that the patient will need surgical revascularization.  This was discussed with the patient and he is willing to proceed.  Hospital Course:  Joshua Curtis is a 69 y.o. male is S/P Right lower extremity intervention for bilateral great toe ulcerations. Previously had undergone revascularization of the left leg and now returning for right lower extremity revascularization Procedure(s): ABDOMINAL AORTOGRAM W/LOWER EXTREMITY PERIPHERAL VASCULAR INTERVENTION PERIPHERAL VASCULAR BALLOON ANGIOPLASTY OF THE RIGHT TIBIOPERONEAL TRUNK AND PERONEAL ARTERY; STENT RIGHT POPLITEAL AND SUPERFICIAL FEMORAL ARTERY;MYNX CLOSURE LEFT FEMORAL ARTERY  Findings:  Right Lower Extremity: The superficial femoral artery is patent through the adductor canal however there is a 60 to 70% stenosis within the adductor canal.  The popliteal artery is occluded with reconstitution of the below-knee popliteal artery.  There is single-vessel runoff via the peroneal artery which collateralizes on the foot.  There is a stenosis within the tibioperoneal trunk and peroneal artery, greater than 50%.  Extubated: POD # 0   Post-op wounds clean, dry, intact or healing well Pt. Ambulating, voiding and taking PO diet without difficulty. Pt pain controlled with PO pain meds. Labs as below Complications: right thigh hematoma in the vastus intermedius muscle identified on CT. Patient also had associated hypotension. Also some hypokalemia but likely deficient at baseline. Potassium was administered with  improvement  He did well post angiogram. He was transfused with 2 units of PRBCs. His right thigh  hematoma remained soft and no expansion clinically. Right thigh pain improved. Bilateral lower extremities well perfused and warm with good doppler signals in both feet. He was felt to be stable for discharge home   PDMP was reviewed and pain medication was sent to patients pharmacy. He will otherwise continue his home medications as prescribed. Patient has follow up arranged on 08/21/20 with Dr. Trula Slade.  Consults: none   Significant Diagnostic Studies: CBC Lab Results  Component Value Date   WBC 8.0 07/19/2020   HGB 7.7 (L) 07/19/2020   HCT 23.4 (L) 07/19/2020   MCV 99.2 07/19/2020   PLT 315 07/19/2020    BMET    Component Value Date/Time   NA 137 07/19/2020 1613   K 3.2 (L) 07/19/2020 1613   CL 101 07/19/2020 1613   CO2 28 07/19/2020 1613   GLUCOSE 86 07/19/2020 1613   BUN 8 07/19/2020 1613   CREATININE 1.02 07/19/2020 1613   CALCIUM 8.2 (L) 07/19/2020 1613   GFRNONAA >60 07/19/2020 1613   GFRAA 100 11/24/2007 0956   COAG No results found for: INR, PROTIME   Disposition:  Discharge to :Home Discharge Instructions    Discharge patient   Complete by: As directed    Once labs resulted   Discharge disposition: 01-Home or Self Care   Discharge patient date: 07/19/2020     Allergies as of 07/19/2020   No Known Allergies     Medication List    TAKE these medications   amLODipine-benazepril 10-40 MG capsule Commonly known as: LOTREL TAKE 1 CAPSULE BY MOUTH  DAILY   aspirin EC 81 MG tablet Take 81 mg by mouth daily. Swallow whole.   clopidogrel 75 MG tablet Commonly known as: Plavix Take 1 tablet (75 mg total) by mouth daily.   ferrous sulfate 325 (65 FE) MG tablet Take 1 tablet (325 mg total) by mouth daily with breakfast.   hydrochlorothiazide 12.5 MG capsule Commonly known as: MICROZIDE TAKE 1 CAPSULE BY MOUTH  DAILY   meloxicam 15 MG tablet Commonly known as: MOBIC TAKE 1 TABLET BY MOUTH EVERY DAY   omeprazole 40 MG capsule Commonly known as:  PRILOSEC Take 1 capsule (40 mg total) by mouth in the morning and at bedtime. Please schedule and office visit for further refills. Thank you What changed:   when to take this  additional instructions   oxyCODONE-acetaminophen 5-325 MG tablet Commonly known as: PERCOCET/ROXICET Take 1 tablet by mouth every 4 (four) hours as needed for moderate pain.   rosuvastatin 10 MG tablet Commonly known as: Crestor Take 1 tablet (10 mg total) by mouth daily.   Santyl ointment Generic drug: collagenase Apply 1 application topically daily. What changed: additional instructions        [facility]  Discharge Instructions  Lower Extremity Angiogram; Angioplasty/Stenting  Please refer to the following instructions for your post-procedure care. Your surgeon or physician assistant will discuss any changes with you.  Activity  Avoid lifting more than 8 pounds (1 gallons of milk) for 72 hours (3 days) after your procedure. You may walk as much as you can tolerate. It's OK to drive after 72 hours.  Bathing/Showering  You may shower the day after your procedure. If you have a bandage, you may remove it at 24- 48 hours. Clean your incision site with mild soap and water. Pat the area dry  with a clean towel.  Diet  DRINK FLUIDS TO STAY HYDRATED FOR 2-3 DAYS. Resume your pre-procedure diet. There are no special food restrictions following this procedure. All patients with peripheral vascular disease should follow a low fat/low cholesterol diet. In order to heal from your surgery, it is CRITICAL to get adequate nutrition. Your body requires vitamins, minerals, and protein. Vegetables are the best source of vitamins and minerals. Vegetables also provide the perfect balance of protein. Processed food has little nutritional value, so try to avoid this.  Medications  Resume taking all of your medications unless your doctor tells you not to. If your incision is causing  pain, you may take over-the-counter pain relievers such as acetaminophen (Tylenol)  Follow Up  Follow up will be arranged at the time of your procedure. You may have an office visit scheduled or may be scheduled for surgery. Ask your surgeon if you have any questions.  Please call us immediately for any of the following conditions: .Severe or worsening pain your legs or feet at rest or with walking. .Increased pain, redness, drainage at your groin puncture site. .Fever of 101 degrees or higher. .If you have any mild or slow bleeding from your puncture site: lie down, apply firm constant pressure over the area with a piece of gauze or a clean wash cloth for 30 minutes- no peeking!, call 911 right away if you are still bleeding after 30 minutes, or if the bleeding is heavy and unmanageable.  Reduce your risk factors of vascular disease:  Stop smoking. If you would like help call [facility] at 1-800-QUIT-NOW [phone]) or [place] at [phone]. Manage your cholesterol Maintain a desired weight Control your diabetes Keep your blood pressure down  If you have any questions, please call the office at [phone]   Verbal and written Discharge instructions given to the patient. Wound care per Discharge AVS  Follow-up Information    Serafina Mitchell, MD Follow up in 4 week(s).   Specialties: Vascular Surgery, Cardiology Why: the office will contact the patient with their follow up (sent) Contact information: [place] [address] [phone]               Signed: Karoline Caldwell 07/20/2020, 8:37 AM

## 2020-07-19 NOTE — Progress Notes (Addendum)
  Progress Note    07/19/2020 8:22 AM 1 Day Post-Op  Subjective: no major complaints. Says right thigh with minimal tenderness. Otherwise says he feels good   Vitals:   07/18/20 2320 07/19/20 0340  BP: 109/64 117/65  Pulse: 96 94  Resp: 17 12  Temp: 98.8 F (37.1 C) 98.5 F (36.9 C)  SpO2: 92% 96%   Physical Exam: General: well appearing, well nourished, sitting up in chair, not in any distress Cardiac: regular Lungs: non labored Extremities: left femoral access site dressing clean and dry. 2+ femoral pulses bilaterally, brisk right PT doppler signal. Left brisk DP and PT signals Abdomen: soft, non tender Neurologic: alert and oriented  CBC    Component Value Date/Time   WBC 8.0 07/19/2020 0128   RBC 2.36 (L) 07/19/2020 0128   HGB 7.7 (L) 07/19/2020 0128   HCT 23.4 (L) 07/19/2020 0128   PLT 315 07/19/2020 0128   MCV 99.2 07/19/2020 0128   MCH 32.6 07/19/2020 0128   MCHC 32.9 07/19/2020 0128   RDW 19.5 (H) 07/19/2020 0128   LYMPHSABS 1,480 04/18/2020 1130   MONOABS 0.4 10/26/2019 1025   EOSABS 178 04/18/2020 1130   BASOSABS 92 04/18/2020 1130    BMET    Component Value Date/Time   NA 140 07/19/2020 0128   K 2.5 (LL) 07/19/2020 0128   CL 104 07/19/2020 0128   CO2 29 07/19/2020 0128   GLUCOSE 106 (H) 07/19/2020 0128   BUN 10 07/19/2020 0128   CREATININE 1.03 07/19/2020 0128   CALCIUM 7.8 (L) 07/19/2020 0128   GFRNONAA >60 07/19/2020 0128   GFRAA 100 11/24/2007 0956    INR No results found for: INR   Intake/Output Summary (Last 24 hours) at 07/19/2020 6160 Last data filed at 07/19/2020 0700 Gross per 24 hour  Intake 1164.67 ml  Output 550 ml  Net 614.67 ml     Assessment/Plan:  69 y.o. male is s/p angiogram of right lower extremity with angioplasty of the right TPT and peroneal arteries, stent of right popliteal and SFA 1 Day Post-Op. Post angiogram patient developed right thigh pain and hypotension. CT scan showed right thigh hematoma. Patient  transfused with 1 unit PRBCs. May need another unit as hgb 7.7. Hypokalemic k 2.5. 10 mEg x3 given. Pending repeat labs this morning.  Bp remains soft. Not symptomatic. Tolerating diet. Voiding without difficulty. Tolerating ambulation. Right thigh pain greatly improved. Right thigh is soft. Bilateral lower extremities well perfused with brisk signals bilaterally. Possible d/c later today pending labs   Karoline Caldwell, [place] [facility] [phone] 07/19/2020 8:22 AM    I agree with above.  Acute blood loss anemia from thigh hematoma.  I will give him 1 more unit of blood and d./c later today  [name]

## 2020-07-19 NOTE — Progress Notes (Signed)
Report received from Lamona Curl, RN.  One unit of blood completed at 1500, vascular PA notified.  STAT lab orders entered around noon do not appear to have been collected.  Vascular would like to see pt's current potassium before discharging.  PA placed another STAT order for potassium.  I have called phlebotomy and notified that discharge is pending and to please prioritize this draw.

## 2020-07-19 NOTE — Progress Notes (Signed)
Post angio, the patient developed left thigh pain and swelling and hypotension.  Pressure was held on the thigh, and IV fluids were given.  HE had no abdominal or back pain.  It was suspected that he had a thigh hematoma.  This was confirmed with CT.  He was transfused 1 unit of blood and his vitals improved.  He is to be admitted for observation.  Wife updated.   Joshua Curtis

## 2020-07-20 LAB — BPAM RBC
Blood Product Expiration Date: 202112212359
Blood Product Expiration Date: 202112222359
ISSUE DATE / TIME: 202111161403
ISSUE DATE / TIME: 202111171158
Unit Type and Rh: 5100
Unit Type and Rh: 5100

## 2020-07-20 LAB — TYPE AND SCREEN
ABO/RH(D): O POS
Antibody Screen: NEGATIVE
Unit division: 0
Unit division: 0

## 2020-07-25 ENCOUNTER — Encounter (HOSPITAL_COMMUNITY): Payer: Self-pay | Admitting: Surgery

## 2020-07-26 ENCOUNTER — Other Ambulatory Visit: Payer: Self-pay

## 2020-07-26 ENCOUNTER — Encounter: Payer: Self-pay | Admitting: Internal Medicine

## 2020-07-26 ENCOUNTER — Ambulatory Visit (INDEPENDENT_AMBULATORY_CARE_PROVIDER_SITE_OTHER): Payer: BC Managed Care – PPO | Admitting: Internal Medicine

## 2020-07-26 DIAGNOSIS — M8610 Other acute osteomyelitis, unspecified site: Secondary | ICD-10-CM | POA: Diagnosis not present

## 2020-07-26 NOTE — Assessment & Plan Note (Signed)
He continues to improve after treatment for acute osteomyelitis.  I believe his infection has been cured.  He will follow-up in 6 weeks.

## 2020-07-26 NOTE — Progress Notes (Signed)
[place] for Infectious Disease  Reason for Consult: Acute osteomyelitis both great toes Referring Provider: Dr. Cathlean Cower  Assessment: I suspect that he has peripheral artery disease complicated by recent ulceration of both great toes and osteomyelitis.  Pictures taken today show significant improvement over pictures taken by Dr. Jacqualyn Posey on 04/18/2020.  I will repeat his C-reactive protein today.  I would like to broaden his antibiotic therapy to amoxicillin clavulanate since infections of the toes and feet are often mixed aerobic anaerobic affairs.  I will plan on 4 more weeks of therapy.  Plan: 1. Change doxycycline to amoxicillin clavulanate 2. Check C-reactive protein 3. Follow-up here in 6 weeks  Patient Active Problem List   Diagnosis Date Noted  . Acute osteomyelitis ([name]) 05/02/2020    Priority: High  . Open wound of right great toe 04/13/2020    Priority: High  . Open wound of left great toe 04/13/2020    Priority: High  . PVD (peripheral vascular disease) ([place]) 07/18/2020  . Cellulitis of toe, left 04/13/2020  . Vitamin D deficiency 10/26/2019  . Right knee pain 04/21/2019  . Aortic atherosclerosis ([place])   . PAH (pulmonary artery hypertension) ([place])   . Coronary artery calcification seen on CAT scan   . Thoracic aortic aneurysm ([place]) 10/27/2017  . Elevated PSA 10/14/2017  . Pulmonary nodule 10/14/2017  . Colon polyps 10/14/2017  . BPH (benign prostatic hyperplasia) 04/10/2017  . Abnormal CT of the abdomen 01/12/2017  . Smoker 01/12/2017  . Anemia, iron deficiency 04/02/2016  . Racing heart beat 10/03/2015  . Hyperglycemia 03/30/2015  . Anemia 03/30/2015  . Hypertension 09/25/2012  . Increased prostate specific antigen (PSA) velocity 06/22/2011  . Preventative health care 06/16/2011  . DIVERTICULOSIS, COLON 11/24/2007  . COLONIC POLYPS, HX OF 11/24/2007    Patient's Medications  New Prescriptions   No medications on file  Previous  Medications   AMLODIPINE-BENAZEPRIL (LOTREL) 10-40 MG CAPSULE    TAKE 1 CAPSULE BY MOUTH  DAILY   ASPIRIN EC 81 MG TABLET    Take 81 mg by mouth daily. Swallow whole.   CLOPIDOGREL (PLAVIX) 75 MG TABLET    Take 1 tablet (75 mg total) by mouth daily.   COLLAGENASE (SANTYL) OINTMENT    Apply 1 application topically daily.   FERROUS SULFATE 325 (65 FE) MG TABLET    Take 1 tablet (325 mg total) by mouth daily with breakfast.   HYDROCHLOROTHIAZIDE (MICROZIDE) 12.5 MG CAPSULE    TAKE 1 CAPSULE BY MOUTH  DAILY   MELOXICAM (MOBIC) 15 MG TABLET    TAKE 1 TABLET BY MOUTH EVERY DAY   OMEPRAZOLE (PRILOSEC) 40 MG CAPSULE    Take 1 capsule (40 mg total) by mouth in the morning and at bedtime. Please schedule and office visit for further refills. Thank you   ROSUVASTATIN (CRESTOR) 10 MG TABLET    Take 1 tablet (10 mg total) by mouth daily.  Modified Medications   No medications on file  Discontinued Medications   OXYCODONE-ACETAMINOPHEN (PERCOCET/ROXICET) 5-325 MG TABLET    Take 1 tablet by mouth every 4 (four) hours as needed for moderate pain.    HPI: Joshua Curtis is in for his routine follow-up visit.  He developed ulceration on his right great toe in July.  Several weeks later he noted a similar lesion on his left great toe.  He denies any trauma.  He has not had any new shoes or boots.  He wears his  old [employer] running shoes when he is at work.  He is on his feet for many hours each day.  He saw Dr. Jenny Reichmann on 04/13/2020.  X-rays were ordered that revealed bilateral osteomyelitis of the distal tuft of both great toes, right greater than left.  He was started on doxycycline on 04/15/2020 and referred to Dr. Celesta Gentile.  He noted diminished pulses in both feet and decreased sensation.  A culture was obtained which grew MSSA.  Sed rate was normal at 2 but C-reactive protein was elevated at 25.  [name] feels like the wounds are improving and this is confirmed by Dr. Pasty Arch note from earlier this morning.  Mr.  Hussar smokes cigarettes.  I broadened his antibiotic therapy to amoxicillin clavulanate and he completed 6 weeks of therapy on 05/19/2020.  Recent Doppler ultrasound did confirm peripheral artery disease.  He recently underwent angioplasty and stenting in both legs.  His claudication has improved markedly and his ulcers are healing slowly.  Review of Systems: Review of Systems  Constitutional: Negative for chills, diaphoresis and fever.  Gastrointestinal: Negative for abdominal pain, diarrhea, nausea and vomiting.  Musculoskeletal: Negative for joint pain.        Past Medical History:  Diagnosis Date  . Anemia, iron deficiency 04/02/2016  . Aortic atherosclerosis ([place])   . Arthritis    rt knee  . BPH (benign prostatic hyperplasia) 04/10/2017  . COLONIC POLYPS, HX OF 11/24/2007  . COPD (chronic obstructive pulmonary disease) ([place])    pt denies this- no respiratory issues of any kind per pt  . Coronary artery calcification seen on CAT scan   . DIVERTICULOSIS, COLON 11/24/2007  . ED (erectile dysfunction)   . GERD (gastroesophageal reflux disease)   . HYPERTENSION 11/24/2007  . Increased prostate specific antigen (PSA) velocity 06/22/2011  . PAH (pulmonary artery hypertension) ([facility])     Social History   Tobacco Use  . Smoking status: Current Every Day Smoker    Packs/day: 0.25    Types: Cigarettes  . Smokeless tobacco: Never Used  Vaping Use  . Vaping Use: Never used  Substance Use Topics  . Alcohol use: Yes    Comment: 1 liquor drink per day   . Drug use: No    Family History  Problem Relation Age of Onset  . Stroke Mother   . Diabetes Father   . Hypertension Sister   . Kidney failure Brother        dialysis  . Diabetes Paternal Grandfather   . Anuerysm Brother   . Colon cancer Neg Hx   . Esophageal cancer Neg Hx   . Rectal cancer Neg Hx   . Stomach cancer Neg Hx    No Known Allergies  OBJECTIVE: Vitals:   07/26/20 1114  BP: 125/64  Pulse: (!) 116  Resp: 16   Temp: 98 F (36.7 C)  SpO2: 98%  Weight: 156 lb (70.8 kg)  Height: 5\' 10"  (1.778 m)   Body mass index is 22.38 kg/m.   Physical Exam Constitutional:      Comments: He is very pleasant and talkative.  Musculoskeletal:     Comments: The superficial ulcerations of both great toes are improving.  There is no surrounding cellulitis, significant drainage or odor.  There is no unusual swelling.       Sed Rate (mm/h)  Date Value  04/18/2020 2   CRP (mg/L)  Date Value  05/02/2020 1.6  04/18/2020 25.0 (H)    Microbiology: No results found  for this or any previous visit (from the past 240 hour(s)).  Michel Bickers, MD [facility] [phone] pager   [phone] cell 07/26/2020, 11:55 AM

## 2020-07-28 ENCOUNTER — Other Ambulatory Visit: Payer: Self-pay | Admitting: Internal Medicine

## 2020-07-29 NOTE — Telephone Encounter (Signed)
Please refill as per office routine med refill policy (all routine meds refilled for 3 mo or monthly per pt preference up to one year from last visit, then month to month grace period for 3 mo, then further med refills will have to be denied)  

## 2020-07-31 ENCOUNTER — Ambulatory Visit (INDEPENDENT_AMBULATORY_CARE_PROVIDER_SITE_OTHER): Payer: BC Managed Care – PPO | Admitting: Podiatry

## 2020-07-31 ENCOUNTER — Other Ambulatory Visit: Payer: Self-pay

## 2020-07-31 DIAGNOSIS — L97524 Non-pressure chronic ulcer of other part of left foot with necrosis of bone: Secondary | ICD-10-CM

## 2020-07-31 DIAGNOSIS — I739 Peripheral vascular disease, unspecified: Secondary | ICD-10-CM | POA: Diagnosis not present

## 2020-07-31 DIAGNOSIS — L97514 Non-pressure chronic ulcer of other part of right foot with necrosis of bone: Secondary | ICD-10-CM | POA: Diagnosis not present

## 2020-08-02 NOTE — Progress Notes (Signed)
Subjective: 69 year old male presents the office today for wound check of bilateral big toes.  Since last saw him he underwent a procedure on the right side.  He states that since he has had procedures ankle fracture with vascular surgery longer has pain to his foot.  He is continue with Santyl dressing changes to the wounds daily.  He denies any increase in swelling or redness or any drainage to the feet.  Currently denies any fevers, chills, nausea, vomiting.  No calf pain, chest pain, shortness of breath.  Angio 07/04/2020 Impression:               #1.  Tandem 95% heavily calcified lesions within the left superficial femoral-popliteal artery successfully stented.  A 5 mm Tigris stent was placed across the joint space followed by 6 x 120 Elluvia                #2  Single-vessel runoff on the left via the peroneal artery               #3  Failed attempt at recanalization of an occluded left posterior tibial artery               #4  Occlusion of the right popliteal artery behind the knee.  The patient will be brought back for an attempt at recanalization  Angio 07/18/2020 Impression:               #1 occlusion of the right popliteal artery and high-grade stenosis of the right superficial femoral artery in the adductor canal with single-vessel runoff.  The origin of the peroneal artery had a greater than 50% stenosis.               #2 successful balloon angioplasty of the posterior tibial tibioperoneal trunk using a 3 mm balloon.  Stenting of the popliteal occlusion and superficial femoral artery stenosis using a 5 x 100 Tigris across the knee followed by overlapping 6 x 120 Elluvia   Objective: AAO x3, NAD Ulcerations within the distal aspects of bilateral hallux.  See picture below.  The wound on the left side is doing much better and there is no mild fibrotic tissue present there is no drainage or pus.  On the right side there is still fibrotic wound presents currently for toenail nails lifting  up.  There is no probing to bone, undermining or tunneling either of ulcers there is no fluctuation crepitation.  There is no malodor. No pain with calf compression, swelling, warmth, erythema        Assessment: 69 year old male with ulcerations bilaterally with PAD  Plan: -All treatment options discussed with the patient including all alternatives, risks, complications.  -Other debrided the wound see the patient comfort.  Continue Santyl dressing changes daily.  I debrided the nail the right side with any complications or bleeding.  Continue surgical shoe.  Unfortunately still out of work until his wounds heal.  Trula Slade DPM

## 2020-08-10 ENCOUNTER — Other Ambulatory Visit: Payer: Self-pay | Admitting: Gastroenterology

## 2020-08-10 ENCOUNTER — Other Ambulatory Visit: Payer: Self-pay | Admitting: *Deleted

## 2020-08-10 DIAGNOSIS — I7025 Atherosclerosis of native arteries of other extremities with ulceration: Secondary | ICD-10-CM

## 2020-08-10 DIAGNOSIS — I739 Peripheral vascular disease, unspecified: Secondary | ICD-10-CM

## 2020-08-21 ENCOUNTER — Ambulatory Visit (HOSPITAL_COMMUNITY)
Admission: RE | Admit: 2020-08-21 | Discharge: 2020-08-21 | Disposition: A | Discharge status: Home / Self Care | Payer: BC Managed Care – PPO | Source: Ambulatory Visit | Attending: Surgery | Admitting: Surgery

## 2020-08-21 ENCOUNTER — Ambulatory Visit (INDEPENDENT_AMBULATORY_CARE_PROVIDER_SITE_OTHER)
Admission: RE | Admit: 2020-08-21 | Discharge: 2020-08-21 | Disposition: A | Discharge status: Home / Self Care | Payer: BC Managed Care – PPO | Source: Ambulatory Visit | Attending: Surgery | Admitting: Surgery

## 2020-08-21 ENCOUNTER — Ambulatory Visit (INDEPENDENT_AMBULATORY_CARE_PROVIDER_SITE_OTHER): Payer: BC Managed Care – PPO | Admitting: Surgery

## 2020-08-21 ENCOUNTER — Other Ambulatory Visit: Payer: Self-pay

## 2020-08-21 ENCOUNTER — Encounter: Payer: Self-pay | Admitting: Surgery

## 2020-08-21 VITALS — BP 105/62 | HR 87 | Temp 98.0°F | Resp 20 | Ht 70.0 in | Wt 159.0 lb

## 2020-08-21 DIAGNOSIS — I739 Peripheral vascular disease, unspecified: Secondary | ICD-10-CM

## 2020-08-21 DIAGNOSIS — I7025 Atherosclerosis of native arteries of other extremities with ulceration: Secondary | ICD-10-CM

## 2020-08-21 NOTE — Progress Notes (Signed)
[facility] [place]  Patient name: Joshua Curtis MRN: [id number] DOB: [birth year] Sex: male   REASON FOR VISIT:    Follow up  HISOTRY OF PRESENT ILLNESS:    Joshua Curtis is a 69 y.o. male who I saw in October 2021 with bilateral great toe ulcers that have been present for approximately 2 months.  He also reported a 1 block history of calf claudication.  On 07/04/2020 he underwent angiography.  He had tandem 95% calcified lesions in the left superficial femoral and popliteal artery which were successfully stented.  The right popliteal was found to be occluded.  He was brought back on 07/18/2020 and had angioplasty of the right tibioperoneal trunk and peroneal artery as well as stenting of the right popliteal and superficial femoral artery.  Post angio, he developed right thigh pain and swelling.  CT scan showed a thigh hematoma.  He received 1 unit of blood was ultimately able to be discharged.  He is back today for follow-up.  The patient is a current smoker.  He is trying to quit.  He suffers from COPD and is not on oxygen.  He is medically managed for hypertension.  He does not take a statin.  He does take a baby aspirin. PAST MEDICAL HISTORY:   Past Medical History:  Diagnosis Date  . Anemia, iron deficiency 04/02/2016  . Aortic atherosclerosis ([place])   . Arthritis    rt knee  . BPH (benign prostatic hyperplasia) 04/10/2017  . COLONIC POLYPS, HX OF 11/24/2007  . COPD (chronic obstructive pulmonary disease) ([place])    pt denies this- no respiratory issues of any kind per pt  . Coronary artery calcification seen on CAT scan   . DIVERTICULOSIS, COLON 11/24/2007  . ED (erectile dysfunction)   . GERD (gastroesophageal reflux disease)   . HYPERTENSION 11/24/2007  . Increased prostate specific antigen (PSA) velocity 06/22/2011  . PAH (pulmonary artery hypertension) ([place])      FAMILY HISTORY:   Family History  Problem Relation  Age of Onset  . Stroke Mother   . Diabetes Father   . Hypertension Sister   . Kidney failure Brother        dialysis  . Diabetes Paternal Grandfather   . Anuerysm Brother   . Colon cancer Neg Hx   . Esophageal cancer Neg Hx   . Rectal cancer Neg Hx   . Stomach cancer Neg Hx     SOCIAL HISTORY:   Social History   Tobacco Use  . Smoking status: Current Every Day Smoker    Packs/day: 0.25    Types: Cigarettes  . Smokeless tobacco: Never Used  Substance Use Topics  . Alcohol use: Yes    Comment: 1 liquor drink per day      ALLERGIES:   No Known Allergies   CURRENT MEDICATIONS:   Current Outpatient Medications  Medication Sig Dispense Refill  . amLODipine-benazepril (LOTREL) 10-40 MG capsule TAKE 1 CAPSULE BY MOUTH  DAILY 90 capsule 3  . aspirin EC 81 MG tablet Take 81 mg by mouth daily. Swallow whole.    . clopidogrel (PLAVIX) 75 MG tablet Take 1 tablet (75 mg total) by mouth daily. 30 tablet 11  . collagenase (SANTYL) ointment Apply 1 application topically daily. (Patient taking differently: Apply 1 application topically daily. Applied toes daily) 15 g 0  . ferrous sulfate 325 (65 FE) MG tablet Take 1 tablet (325 mg total) by mouth daily with breakfast. 90 tablet 0  .  hydrochlorothiazide (MICROZIDE) 12.5 MG capsule TAKE 1 CAPSULE BY MOUTH  DAILY 90 capsule 3  . meloxicam (MOBIC) 15 MG tablet TAKE 1 TABLET BY MOUTH EVERY DAY (Patient taking differently: Take 15 mg by mouth daily.) 90 tablet 1  . omeprazole (PRILOSEC) 40 MG capsule TAKE 1 CAPSULE IN THE MORNING AND AT BEDTIME. (INSURANCE WILL ONLY COVER ONE CAPSULE PER DAY) 30 capsule 1  . rosuvastatin (CRESTOR) 10 MG tablet Take 1 tablet (10 mg total) by mouth daily. 30 tablet 11   No current facility-administered medications for this visit.    REVIEW OF SYSTEMS:   [X]  denotes positive finding, [ ]  denotes negative finding Cardiac  Comments:  Chest pain or chest pressure:    Shortness of breath upon exertion:     Short of breath when lying flat:    Irregular heart rhythm:        Vascular    Pain in calf, thigh, or hip brought on by ambulation:    Pain in feet at night that wakes you up from your sleep:     Blood clot in your veins:    Leg swelling:         Pulmonary    Oxygen at home:    Productive cough:     Wheezing:         Neurologic    Sudden weakness in arms or legs:     Sudden numbness in arms or legs:     Sudden onset of difficulty speaking or slurred speech:    Temporary loss of vision in one eye:     Problems with dizziness:         Gastrointestinal    Blood in stool:     Vomited blood:         Genitourinary    Burning when urinating:     Blood in urine:        Psychiatric    Major depression:         Hematologic    Bleeding problems:    Problems with blood clotting too easily:        Skin    Rashes or ulcers:        Constitutional    Fever or chills:      PHYSICAL EXAM:   Vitals:   08/21/20 1337  BP: 105/62  Pulse: 87  Resp: 20  Temp: 98 F (36.7 C)  SpO2: 99%  Weight: 159 lb (72.1 kg)  Height: 5\' 10"  (1.778 m)    GENERAL: The patient is a well-nourished male, in no acute distress. The vital signs are documented above. CARDIAC: There is a regular rate and rhythm.  VASCULAR: Pedal pulses are not palpable PULMONARY: Non-labored respirations MUSCULOSKELETAL: There are no major deformities or cyanosis. NEUROLOGIC: No focal weakness or paresthesias are detected. SKIN: See photo below PSYCHIATRIC: The patient has a normal affect.    STUDIES:   I have reviewed the following ultrasound studies:  ABI/TBIToday's ABIToday's TBIPrevious ABIPrevious TBI  +-------+-----------+-----------+------------+------------+  Right 0.89    bandage  0.51    0.00      +-------+-----------+-----------+------------+------------+  Left  0.91    bandage  0.55    0.00       +-------+-----------+-----------+------------+------------+ Right: 50-74% stenosis noted in the deep femoral artery.  Widely patent mid superficial femoral to popliteal artery stent.  Irregular and non-hemodynamically significant plaque observed in the  common femoral artery.   Left: Widely patent mid superficial femoral to popliteal artery stent.  Irregular and non-hemodynamically significant plaque observed in the  common femoral artery.  MEDICAL ISSUES:   Bilateral lower extremity interventions which are widely patent by ultrasound imaging today.  His wounds on both great toes appear to be improving.  These are being managed by podiatry.  He will follow-up in 3 months for repeat imaging.  He will be maintained on aspirin and Plavix as well as a statin.    Leia Alf, MD, FACS [facility] [phone] Pager [phone]

## 2020-08-22 ENCOUNTER — Other Ambulatory Visit: Payer: Self-pay

## 2020-08-22 ENCOUNTER — Ambulatory Visit (INDEPENDENT_AMBULATORY_CARE_PROVIDER_SITE_OTHER): Payer: BC Managed Care – PPO | Admitting: Podiatry

## 2020-08-22 DIAGNOSIS — I7025 Atherosclerosis of native arteries of other extremities with ulceration: Secondary | ICD-10-CM

## 2020-08-22 DIAGNOSIS — L97524 Non-pressure chronic ulcer of other part of left foot with necrosis of bone: Secondary | ICD-10-CM

## 2020-08-22 DIAGNOSIS — I739 Peripheral vascular disease, unspecified: Secondary | ICD-10-CM | POA: Diagnosis not present

## 2020-08-22 DIAGNOSIS — L97514 Non-pressure chronic ulcer of other part of right foot with necrosis of bone: Secondary | ICD-10-CM | POA: Diagnosis not present

## 2020-08-23 DIAGNOSIS — L97524 Non-pressure chronic ulcer of other part of left foot with necrosis of bone: Secondary | ICD-10-CM | POA: Diagnosis not present

## 2020-08-23 NOTE — Progress Notes (Addendum)
Subjective: 69 year old male presents the office today for wound check of bilateral big toes.  Left toe is doing well the right foot about the same.  Been keeping Santyl on the wounds daily.  He has not seen any increasing drainage, swelling, erythema has not identified any purulence.  Denies any fevers, chills, nausea, vomiting.  No calf pain, chest pain, shortness of breath.  Angio 07/04/2020 Impression:               #1.  Tandem 95% heavily calcified lesions within the left superficial femoral-popliteal artery successfully stented.  A 5 mm Tigris stent was placed across the joint space followed by 6 x 120 Elluvia                #2  Single-vessel runoff on the left via the peroneal artery               #3  Failed attempt at recanalization of an occluded left posterior tibial artery               #4  Occlusion of the right popliteal artery behind the knee.  The patient will be brought back for an attempt at recanalization  Angio 07/18/2020 Impression:               #1 occlusion of the right popliteal artery and high-grade stenosis of the right superficial femoral artery in the adductor canal with single-vessel runoff.  The origin of the peroneal artery had a greater than 50% stenosis.               #2 successful balloon angioplasty of the posterior tibial tibioperoneal trunk using a 3 mm balloon.  Stenting of the popliteal occlusion and superficial femoral artery stenosis using a 5 x 100 Tigris across the knee followed by overlapping 6 x 120 Elluvia   Objective: AAO x3, NAD Ulcerations within the distal aspects of bilateral hallux.  Febrile given wound still present the right central granular ulceration present left hallux.  Left has been doing well is healed.  The right hallux is about the same with slight improvement.  There is no probing, undermining or tunneling.  Mild tenderness palpation of the wound.  There is no fluctuation crepitation there is no malodor. No pain with calf compression,  swelling, warmth, erythema  Assessment: 69 year old male with ulcerations bilaterally with PAD  Plan: -All treatment options discussed with the patient including all alternatives, risks, complications.  -Foot has been continue with Santyl dressing changes.  We will switch to collagen silver which I would order for him. Prisma  -Continue follow up with vascular surgery and ID  Trula Slade DPM

## 2020-08-24 ENCOUNTER — Telehealth: Payer: Self-pay | Admitting: Podiatry

## 2020-08-24 NOTE — Telephone Encounter (Signed)
Please go ahead and do an additional 6 weeks

## 2020-08-24 NOTE — Telephone Encounter (Signed)
Good Morning, how long will Joshua Curtis out of work be extended?

## 2020-09-05 ENCOUNTER — Other Ambulatory Visit: Payer: Self-pay

## 2020-09-05 ENCOUNTER — Ambulatory Visit (INDEPENDENT_AMBULATORY_CARE_PROVIDER_SITE_OTHER): Payer: BC Managed Care – PPO | Admitting: Podiatry

## 2020-09-05 DIAGNOSIS — L97524 Non-pressure chronic ulcer of other part of left foot with necrosis of bone: Secondary | ICD-10-CM

## 2020-09-05 DIAGNOSIS — I739 Peripheral vascular disease, unspecified: Secondary | ICD-10-CM

## 2020-09-05 DIAGNOSIS — L97514 Non-pressure chronic ulcer of other part of right foot with necrosis of bone: Secondary | ICD-10-CM | POA: Diagnosis not present

## 2020-09-06 ENCOUNTER — Encounter: Payer: Self-pay | Admitting: Internal Medicine

## 2020-09-06 ENCOUNTER — Ambulatory Visit (INDEPENDENT_AMBULATORY_CARE_PROVIDER_SITE_OTHER): Payer: BC Managed Care – PPO | Admitting: Internal Medicine

## 2020-09-06 VITALS — BP 120/70 | HR 90 | Temp 98.8°F | Ht 70.0 in | Wt 157.0 lb

## 2020-09-06 DIAGNOSIS — F17200 Nicotine dependence, unspecified, uncomplicated: Secondary | ICD-10-CM

## 2020-09-06 DIAGNOSIS — E559 Vitamin D deficiency, unspecified: Secondary | ICD-10-CM

## 2020-09-06 DIAGNOSIS — E7849 Other hyperlipidemia: Secondary | ICD-10-CM | POA: Diagnosis not present

## 2020-09-06 DIAGNOSIS — R972 Elevated prostate specific antigen [PSA]: Secondary | ICD-10-CM

## 2020-09-06 DIAGNOSIS — I10 Essential (primary) hypertension: Secondary | ICD-10-CM | POA: Diagnosis not present

## 2020-09-06 DIAGNOSIS — Z23 Encounter for immunization: Secondary | ICD-10-CM

## 2020-09-06 DIAGNOSIS — E538 Deficiency of other specified B group vitamins: Secondary | ICD-10-CM

## 2020-09-06 DIAGNOSIS — R739 Hyperglycemia, unspecified: Secondary | ICD-10-CM

## 2020-09-06 DIAGNOSIS — D509 Iron deficiency anemia, unspecified: Secondary | ICD-10-CM | POA: Diagnosis not present

## 2020-09-06 DIAGNOSIS — E785 Hyperlipidemia, unspecified: Secondary | ICD-10-CM

## 2020-09-06 HISTORY — DX: Hyperlipidemia, unspecified: E78.5

## 2020-09-06 LAB — CBC WITH DIFFERENTIAL/PLATELET
Basophils Absolute: 0.1 10*3/uL (ref 0.0–0.1)
Basophils Relative: 1.7 % (ref 0.0–3.0)
Eosinophils Absolute: 0.3 10*3/uL (ref 0.0–0.7)
Eosinophils Relative: 5.6 % — ABNORMAL HIGH (ref 0.0–5.0)
HCT: 34.9 % — ABNORMAL LOW (ref 39.0–52.0)
Hemoglobin: 11.5 g/dL — ABNORMAL LOW (ref 13.0–17.0)
Lymphocytes Relative: 24.6 % (ref 12.0–46.0)
Lymphs Abs: 1.2 10*3/uL (ref 0.7–4.0)
MCHC: 32.9 g/dL (ref 30.0–36.0)
MCV: 95.7 fl (ref 78.0–100.0)
Monocytes Absolute: 0.6 10*3/uL (ref 0.1–1.0)
Monocytes Relative: 13 % — ABNORMAL HIGH (ref 3.0–12.0)
Neutro Abs: 2.6 10*3/uL (ref 1.4–7.7)
Neutrophils Relative %: 55.1 % (ref 43.0–77.0)
Platelets: 301 10*3/uL (ref 150.0–400.0)
RBC: 3.64 Mil/uL — ABNORMAL LOW (ref 4.22–5.81)
RDW: 17.3 % — ABNORMAL HIGH (ref 11.5–15.5)
WBC: 4.7 10*3/uL (ref 4.0–10.5)

## 2020-09-06 LAB — VITAMIN D 25 HYDROXY (VIT D DEFICIENCY, FRACTURES): VITD: 16.07 ng/mL — ABNORMAL LOW (ref 30.00–100.00)

## 2020-09-06 LAB — URINALYSIS, ROUTINE W REFLEX MICROSCOPIC
Bilirubin Urine: NEGATIVE
Hgb urine dipstick: NEGATIVE
Ketones, ur: NEGATIVE
Leukocytes,Ua: NEGATIVE
Nitrite: NEGATIVE
RBC / HPF: NONE SEEN (ref 0–?)
Specific Gravity, Urine: 1.02 (ref 1.000–1.030)
Total Protein, Urine: NEGATIVE
Urine Glucose: NEGATIVE
Urobilinogen, UA: 0.2 (ref 0.0–1.0)
pH: 6 (ref 5.0–8.0)

## 2020-09-06 LAB — VITAMIN B12: Vitamin B-12: 317 pg/mL (ref 211–911)

## 2020-09-06 LAB — HEPATIC FUNCTION PANEL
ALT: 27 U/L (ref 0–53)
AST: 25 U/L (ref 0–37)
Albumin: 4 g/dL (ref 3.5–5.2)
Alkaline Phosphatase: 61 U/L (ref 39–117)
Bilirubin, Direct: 0.1 mg/dL (ref 0.0–0.3)
Total Bilirubin: 0.4 mg/dL (ref 0.2–1.2)
Total Protein: 6.8 g/dL (ref 6.0–8.3)

## 2020-09-06 LAB — BASIC METABOLIC PANEL
BUN: 13 mg/dL (ref 6–23)
CO2: 31 mEq/L (ref 19–32)
Calcium: 8.9 mg/dL (ref 8.4–10.5)
Chloride: 102 mEq/L (ref 96–112)
Creatinine, Ser: 1.05 mg/dL (ref 0.40–1.50)
GFR: 73.48 mL/min (ref >60.00)
Glucose, Bld: 94 mg/dL (ref 70–99)
Potassium: 3.7 mEq/L (ref 3.5–5.1)
Sodium: 139 mEq/L (ref 135–145)

## 2020-09-06 LAB — LIPID PANEL
Cholesterol: 110 mg/dL (ref 0–200)
HDL: 36 mg/dL — ABNORMAL LOW (ref >39.00)
LDL Cholesterol: 55 mg/dL (ref 0–99)
NonHDL: 73.55
Total CHOL/HDL Ratio: 3
Triglycerides: 92 mg/dL (ref 0.0–149.0)
VLDL: 18.4 mg/dL (ref 0.0–40.0)

## 2020-09-06 LAB — IBC PANEL
Iron: 104 ug/dL (ref 42–165)
Saturation Ratios: 31.9 % (ref 20.0–50.0)
Transferrin: 233 mg/dL (ref 212.0–360.0)

## 2020-09-06 LAB — HEMOGLOBIN A1C: Hgb A1c MFr Bld: 4.5 % — ABNORMAL LOW (ref 4.6–6.5)

## 2020-09-06 LAB — FERRITIN: Ferritin: 76.8 ng/mL (ref 22.0–322.0)

## 2020-09-06 LAB — TSH: TSH: 1.15 u[IU]/mL (ref 0.35–4.50)

## 2020-09-06 NOTE — Progress Notes (Signed)
Established Patient Office Visit  Subjective:  Patient ID: Joshua Curtis, male    DOB: 1951-04-21  Age: 70 y.o. MRN: [id number]       Chief Complaint: here to f/u elevated PSA, htn, hyperglycemia, anemia, smoker, vit defieicney and hld       HPI:  Joshua Curtis is a 70 y.o. male here for above, overall doing nicely, still smoking but trying to quit with nicorette.  No overt bleeding.  S/p 2 units PRBC about 2 mo ago for hgb 7.7 post op for ABL anemia, no f/u since then.  But no overt blood loss since then. Has prostate biopsy scheduled soon with Dr Abner Greenspan.   Denies urinary symptoms such as dysuria, frequency, urgency, flank pain, hematuria or n/v, fever, chills.  Pt denies chest pain, increased sob or doe, wheezing, orthopnea, PND, increased LE swelling, palpitations, dizziness or syncope.  Pt denies new neurological symptoms such as new headache, or facial or extremity weakness or numbness   Pt denies polydipsia, polyuria,.   Currently on LT disability.  Plans to completely retire soon. Wt Readings from Last 3 Encounters:  09/06/20 157 lb (71.2 kg)  08/21/20 159 lb (72.1 kg)  07/26/20 156 lb (70.8 kg)   BP Readings from Last 3 Encounters:  09/06/20 120/70  08/21/20 105/62  07/26/20 125/64     Past Medical History:  Diagnosis Date  . Anemia, iron deficiency 04/02/2016  . Aortic atherosclerosis ([place])   . Arthritis    rt knee  . BPH (benign prostatic hyperplasia) 04/10/2017  . COLONIC POLYPS, HX OF 11/24/2007  . COPD (chronic obstructive pulmonary disease) ([place])    pt denies this- no respiratory issues of any kind per pt  . Coronary artery calcification seen on CAT scan   . DIVERTICULOSIS, COLON 11/24/2007  . ED (erectile dysfunction)   . GERD (gastroesophageal reflux disease)   . HLD (hyperlipidemia) 09/06/2020  . HYPERTENSION 11/24/2007  . Increased prostate specific antigen (PSA) velocity 06/22/2011  . PAH (pulmonary artery hypertension) ([place])    Past Surgical History:  Procedure  Laterality Date  . ABDOMINAL AORTOGRAM W/LOWER EXTREMITY N/A 07/04/2020   Procedure: ABDOMINAL AORTOGRAM W/LOWER EXTREMITY;  Surgeon: Serafina Mitchell, MD;  Location: [facility];  Service: Cardiovascular;  Laterality: N/A;  . ABDOMINAL AORTOGRAM W/LOWER EXTREMITY N/A 07/18/2020   Procedure: ABDOMINAL AORTOGRAM W/LOWER EXTREMITY;  Surgeon: Serafina Mitchell, MD;  Location: [facility];  Service: Cardiovascular;  Laterality: N/A;  . COLONOSCOPY    . NO PAST SURGERIES    . PERIPHERAL VASCULAR BALLOON ANGIOPLASTY Right 07/18/2020   Procedure: PERIPHERAL VASCULAR BALLOON ANGIOPLASTY;  Surgeon: Serafina Mitchell, MD;  Location: [facility];  Service: Cardiovascular;  Laterality: Right;  tibioperoneal trunk and posterior tibial  . PERIPHERAL VASCULAR INTERVENTION  07/04/2020   Procedure: PERIPHERAL VASCULAR INTERVENTION;  Surgeon: Serafina Mitchell, MD;  Location: [facility];  Service: Cardiovascular;;  Lt. SFA and Popliteal  . PERIPHERAL VASCULAR INTERVENTION Right 07/18/2020   Procedure: PERIPHERAL VASCULAR INTERVENTION;  Surgeon: Serafina Mitchell, MD;  Location: [facility];  Service: Cardiovascular;  Laterality: Right;  Femoral Popliteal    reports that he has been smoking cigarettes. He has been smoking about 0.25 packs per day. He has never used smokeless tobacco. He reports current alcohol use. He reports that he does not use drugs. family history includes Anuerysm in his brother; Diabetes in his father and paternal grandfather; Hypertension in his sister; Kidney  failure in his brother; Stroke in his mother. No Known Allergies Current Outpatient Medications on File Prior to Visit  Medication Sig Dispense Refill  . amLODipine-benazepril (LOTREL) 10-40 MG capsule TAKE 1 CAPSULE BY MOUTH  DAILY 90 capsule 3  . aspirin EC 81 MG tablet Take 81 mg by mouth daily. Swallow whole.    . clopidogrel (PLAVIX) 75 MG tablet Take 1 tablet (75 mg total) by mouth daily. 30 tablet  11  . collagenase (SANTYL) ointment Apply 1 application topically daily. (Patient taking differently: Apply 1 application topically daily. Applied toes daily) 15 g 0  . ferrous sulfate 325 (65 FE) MG tablet Take 1 tablet (325 mg total) by mouth daily with breakfast. 90 tablet 0  . hydrochlorothiazide (MICROZIDE) 12.5 MG capsule TAKE 1 CAPSULE BY MOUTH  DAILY 90 capsule 3  . meloxicam (MOBIC) 15 MG tablet TAKE 1 TABLET BY MOUTH EVERY DAY (Patient taking differently: Take 15 mg by mouth daily.) 90 tablet 1  . omeprazole (PRILOSEC) 40 MG capsule TAKE 1 CAPSULE IN THE MORNING AND AT BEDTIME. (INSURANCE WILL ONLY COVER ONE CAPSULE PER DAY) 30 capsule 1  . rosuvastatin (CRESTOR) 10 MG tablet Take 1 tablet (10 mg total) by mouth daily. 30 tablet 11   No current facility-administered medications on file prior to visit.        ROS:  All others reviewed and negative.  Objective        PE:  BP 120/70 (BP Location: Left Arm, Patient Position: Sitting, Cuff Size: Large)   Pulse 90   Temp 98.8 F (37.1 C) (Oral)   Ht 5\' 10"  (1.778 m)   Wt 157 lb (71.2 kg)   SpO2 98%   BMI 22.53 kg/m                 Constitutional: Pt appears in NAD               HENT: Head: NCAT.                Right Ear: External ear normal.                 Left Ear: External ear normal.                Eyes: . Pupils are equal, round, and reactive to light. Conjunctivae and EOM are normal               Nose: without d/c or deformity               Neck: Neck supple. Gross normal ROM               Cardiovascular: Normal rate and regular rhythm.                 Pulmonary/Chest: Effort normal and breath sounds without rales or wheezing.                Abd:  Soft, NT, ND, + BS, no organomegaly               Neurological: Pt is alert. At baseline orientation, motor grossly intact               Skin: Skin is warm. No rashes, no other new lesions, LE edema - none               Psychiatric: Pt behavior is normal without agitation    Assessment/Plan:  [name] is a 70 y.o.  Black or African American [2] male with  has a past medical history of Anemia, iron deficiency (04/02/2016), Aortic atherosclerosis ([place]), Arthritis, BPH (benign prostatic hyperplasia) (04/10/2017), COLONIC POLYPS, HX OF (11/24/2007), COPD (chronic obstructive pulmonary disease) ([place]), Coronary artery calcification seen on CAT scan, DIVERTICULOSIS, COLON (11/24/2007), ED (erectile dysfunction), GERD (gastroesophageal reflux disease), HLD (hyperlipidemia) (09/06/2020), HYPERTENSION (11/24/2007), Increased prostate specific antigen (PSA) velocity (06/22/2011), and PAH (pulmonary artery hypertension) ([place]).  Assessment Plan  See notes Labs reviewed for each problem: Lab Results  Component Value Date   WBC 4.7 09/06/2020   HGB 11.5 (L) 09/06/2020   HCT 34.9 (L) 09/06/2020   PLT 301.0 09/06/2020   GLUCOSE 94 09/06/2020   CHOL 110 09/06/2020   TRIG 92.0 09/06/2020   HDL 36.00 (L) 09/06/2020   LDLDIRECT 74.0 04/21/2019   LDLCALC 55 09/06/2020   ALT 27 09/06/2020   AST 25 09/06/2020   NA 139 09/06/2020   K 3.7 09/06/2020   CL 102 09/06/2020   CREATININE 1.05 09/06/2020   BUN 13 09/06/2020   CO2 31 09/06/2020   TSH 1.15 09/06/2020   PSA 8.31 (H) 10/26/2019   HGBA1C 4.5 Repeated and verified X2. (L) 09/06/2020    Micro: none  Cardiac tracings I have personally interpreted today:  none  Pertinent Radiological findings (summarize): none    There are no preventive care reminders to display for this patient.  There are no preventive care reminders to display for this patient.  Lab Results  Component Value Date   TSH 1.15 09/06/2020   Lab Results  Component Value Date   WBC 4.7 09/06/2020   HGB 11.5 (L) 09/06/2020   HCT 34.9 (L) 09/06/2020   MCV 95.7 09/06/2020   PLT 301.0 09/06/2020   Lab Results  Component Value Date   NA 139 09/06/2020   K 3.7 09/06/2020   CO2 31 09/06/2020   GLUCOSE 94 09/06/2020   BUN 13 09/06/2020    CREATININE 1.05 09/06/2020   BILITOT 0.4 09/06/2020   ALKPHOS 61 09/06/2020   AST 25 09/06/2020   ALT 27 09/06/2020   PROT 6.8 09/06/2020   ALBUMIN 4.0 09/06/2020   CALCIUM 8.9 09/06/2020   ANIONGAP 8 07/19/2020   GFR 73.48 09/06/2020   Lab Results  Component Value Date   CHOL 110 09/06/2020   Lab Results  Component Value Date   HDL 36.00 (L) 09/06/2020   Lab Results  Component Value Date   LDLCALC 55 09/06/2020   Lab Results  Component Value Date   TRIG 92.0 09/06/2020   Lab Results  Component Value Date   CHOLHDL 3 09/06/2020   Lab Results  Component Value Date   HGBA1C 4.5 Repeated and verified X2. (L) 09/06/2020      Assessment & Plan:   Problem List Items Addressed This Visit      Medium   Vitamin D deficiency    Last vitamin D Lab Results  Component Value Date   VD25OH 16.07 (L) 09/06/2020   Stable, to start oral replacement      Relevant Orders   VITAMIN D 25 Hydroxy (Vit-D Deficiency, Fractures) (Completed)   Smoker    Trying to quit, declines chantix, cont nicorette      Increased prostate specific antigen (PSA) velocity    For repeat biopsy soon with Dr Abner Greenspan, asympt,  to f/u any worsening symptoms or concerns      Hypertension    BP Readings from Last 3 Encounters:  09/06/20 120/70  08/21/20 105/62  07/26/20 125/64   Stable, pt to continue medical treatment lotrel, microzide   Current Outpatient Medications (Cardiovascular):  .  amLODipine-benazepril (LOTREL) 10-40 MG capsule, TAKE 1 CAPSULE BY MOUTH  DAILY .  hydrochlorothiazide (MICROZIDE) 12.5 MG capsule, TAKE 1 CAPSULE BY MOUTH  DAILY .  rosuvastatin (CRESTOR) 10 MG tablet, Take 1 tablet (10 mg total) by mouth daily.   Current Outpatient Medications (Analgesics):  .  aspirin EC 81 MG tablet, Take 81 mg by mouth daily. Swallow whole. .  meloxicam (MOBIC) 15 MG tablet, TAKE 1 TABLET BY MOUTH EVERY DAY (Patient taking differently: Take 15 mg by mouth daily.)  Current Outpatient  Medications (Hematological):  .  clopidogrel (PLAVIX) 75 MG tablet, Take 1 tablet (75 mg total) by mouth daily. .  ferrous sulfate 325 (65 FE) MG tablet, Take 1 tablet (325 mg total) by mouth daily with breakfast.  Current Outpatient Medications (Other):  .  collagenase (SANTYL) ointment, Apply 1 application topically daily. (Patient taking differently: Apply 1 application topically daily. Applied toes daily) .  omeprazole (PRILOSEC) 40 MG capsule, TAKE 1 CAPSULE IN THE MORNING AND AT BEDTIME. (INSURANCE WILL ONLY COVER ONE CAPSULE PER DAY)       Hyperglycemia - Primary    Lab Results  Component Value Date   HGBA1C 4.5 Repeated and verified X2. (L) 09/06/2020   Stable, pt to continue current medical treatment  - diet      Relevant Orders   Hemoglobin A1c (Completed)   HLD (hyperlipidemia)    Lab Results  Component Value Date   LDLCALC 55 09/06/2020   Stable, pt to continue current statin crestor 10       Relevant Orders   Lipid panel (Completed)   Hepatic function panel (Completed)   CBC with Differential/Platelet (Completed)   TSH (Completed)   Urinalysis, Routine w reflex microscopic (Completed)   Basic metabolic panel (Completed)   Anemia, iron deficiency    Complicated by ABL anemia and 2 u prbc early nov 2021 post op vascular surgury, will need f/u lab today      Relevant Orders   IBC panel (Completed)   Ferritin (Completed)    Other Visit Diagnoses    B12 deficiency       Relevant Orders   Vitamin B12 (Completed)   Flu vaccine need       Relevant Orders   Flu Vaccine QUAD High Dose(Fluad) (Completed)   Need for Tdap vaccination       Relevant Orders   Tdap vaccine greater than or equal to 7yo IM (Completed)      No orders of the defined types were placed in this encounter.   Follow-up: Return in about 6 months (around 03/06/2021).   Oliver Barre, MD 09/06/2020 9:50 AM [facility]

## 2020-09-06 NOTE — Assessment & Plan Note (Signed)
For repeat biopsy soon with Dr Cardell Peach, asympt,  to f/u any worsening symptoms or concerns

## 2020-09-06 NOTE — Patient Instructions (Addendum)
You had the flu shot today, and the Tdap ushot today  Please stop smoking  Please continue all other medications as before, and refills have been done if requested.  Please have the pharmacy call with any other refills you may need.  Please continue your efforts at being more active, low cholesterol diet, and weight control.  Please keep your appointments with your specialists as you may have planned  Please go to the LAB at the blood drawing area for the tests to be done  You will be contacted by phone if any changes need to be made immediately.  Otherwise, you will receive a letter about your results with an explanation, but please check with MyChart first.  Please remember to sign up for MyChart if you have not done so, as this will be important to you in the future with finding out test results, communicating by private email, and scheduling acute appointments online when needed.  Please make an Appointment to return in 6 months, or sooner if needed

## 2020-09-06 NOTE — Assessment & Plan Note (Signed)
Complicated by ABL anemia and 2 u prbc early nov 2021 post op vascular surgury, will need f/u lab today

## 2020-09-07 NOTE — Progress Notes (Signed)
Subjective: 70 year old male presents the office today for wound check of bilateral big toes.  States her left toes been doing very well.  The right wound is doing somewhat better.  There is no drainage or pus or any swelling.  Denies any fevers, chills, nausea, vomiting.  No calf pain, chest pain, shortness of breath.  Angio 07/04/2020 Impression:               #1.  Tandem 95% heavily calcified lesions within the left superficial femoral-popliteal artery successfully stented.  A 5 mm Tigris stent was placed across the joint space followed by 6 x 120 Elluvia                #2  Single-vessel runoff on the left via the peroneal artery               #3  Failed attempt at recanalization of an occluded left posterior tibial artery               #4  Occlusion of the right popliteal artery behind the knee.  The patient will be brought back for an attempt at recanalization  Angio 07/18/2020 Impression:               #1 occlusion of the right popliteal artery and high-grade stenosis of the right superficial femoral artery in the adductor canal with single-vessel runoff.  The origin of the peroneal artery had a greater than 50% stenosis.               #2 successful balloon angioplasty of the posterior tibial tibioperoneal trunk using a 3 mm balloon.  Stenting of the popliteal occlusion and superficial femoral artery stenosis using a 5 x 100 Tigris across the knee followed by overlapping 6 x 120 Elluvia   Objective: AAO x3, NAD Ulcerations within the distal aspects of bilateral hallux.  The wound of the left sinus almost completely healed.  There is only a small area of superficial skin breakdown there is no drainage or pus, edema, erythema or signs of infection.  On the right foot lateral granular wound base is still evident.  Again there is no surrounding erythema, ascending cellulitis there is no fluctuation crepitation.  There is no malodor. No pain with calf compression, swelling, warmth,  erythema      Assessment: 70 year old male with ulcerations bilaterally with PAD  Plan: -All treatment options discussed with the patient including all alternatives, risks, complications.  -On the left side continue with the collagen dressing daily.  On the right side we will switch back to using Santyl. -Sharply debrided the wound of the right foot without any complications to remove nonviable devitalized tissue to help promote wound healing. -Continue offloading. Monitor for any clinical signs or symptoms of infection and directed to call the office immediately should any occur or go to the ER. -Continue follow up with vascular surgery and ID  *x-ray bilateral feet next appointment   Vivi Barrack DPM

## 2020-09-09 ENCOUNTER — Other Ambulatory Visit: Payer: Self-pay | Admitting: Gastroenterology

## 2020-09-18 ENCOUNTER — Encounter: Payer: Self-pay | Admitting: Internal Medicine

## 2020-09-18 NOTE — Assessment & Plan Note (Signed)
Lab Results  Component Value Date   HGBA1C 4.5 Repeated and verified X2. (L) 09/06/2020   Stable, pt to continue current medical treatment  - diet  

## 2020-09-18 NOTE — Assessment & Plan Note (Signed)
Last vitamin D Lab Results  Component Value Date   VD25OH 16.07 (L) 09/06/2020   Stable, to start oral replacement

## 2020-09-18 NOTE — Assessment & Plan Note (Signed)
BP Readings from Last 3 Encounters:  09/06/20 120/70  08/21/20 105/62  07/26/20 125/64   Stable, pt to continue medical treatment lotrel, microzide   Current Outpatient Medications (Cardiovascular):  .  amLODipine-benazepril (LOTREL) 10-40 MG capsule, TAKE 1 CAPSULE BY MOUTH  DAILY .  hydrochlorothiazide (MICROZIDE) 12.5 MG capsule, TAKE 1 CAPSULE BY MOUTH  DAILY .  rosuvastatin (CRESTOR) 10 MG tablet, Take 1 tablet (10 mg total) by mouth daily.   Current Outpatient Medications (Analgesics):  .  aspirin EC 81 MG tablet, Take 81 mg by mouth daily. Swallow whole. .  meloxicam (MOBIC) 15 MG tablet, TAKE 1 TABLET BY MOUTH EVERY DAY (Patient taking differently: Take 15 mg by mouth daily.)  Current Outpatient Medications (Hematological):  .  clopidogrel (PLAVIX) 75 MG tablet, Take 1 tablet (75 mg total) by mouth daily. .  ferrous sulfate 325 (65 FE) MG tablet, Take 1 tablet (325 mg total) by mouth daily with breakfast.  Current Outpatient Medications (Other):  .  collagenase (SANTYL) ointment, Apply 1 application topically daily. (Patient taking differently: Apply 1 application topically daily. Applied toes daily) .  omeprazole (PRILOSEC) 40 MG capsule, TAKE 1 CAPSULE IN THE MORNING AND AT BEDTIME. (INSURANCE WILL ONLY COVER ONE CAPSULE PER DAY)

## 2020-09-18 NOTE — Assessment & Plan Note (Signed)
Trying to quit, declines chantix, cont nicorette

## 2020-09-18 NOTE — Assessment & Plan Note (Signed)
Lab Results  Component Value Date   LDLCALC 55 09/06/2020   Stable, pt to continue current statin crestor 10

## 2020-09-21 ENCOUNTER — Ambulatory Visit (INDEPENDENT_AMBULATORY_CARE_PROVIDER_SITE_OTHER): Payer: BC Managed Care – PPO | Admitting: Podiatry

## 2020-09-21 ENCOUNTER — Other Ambulatory Visit: Payer: Self-pay

## 2020-09-21 DIAGNOSIS — I739 Peripheral vascular disease, unspecified: Secondary | ICD-10-CM | POA: Diagnosis not present

## 2020-09-21 DIAGNOSIS — L97514 Non-pressure chronic ulcer of other part of right foot with necrosis of bone: Secondary | ICD-10-CM | POA: Diagnosis not present

## 2020-09-26 DIAGNOSIS — R972 Elevated prostate specific antigen [PSA]: Secondary | ICD-10-CM | POA: Diagnosis not present

## 2020-09-26 DIAGNOSIS — C61 Malignant neoplasm of prostate: Secondary | ICD-10-CM | POA: Diagnosis not present

## 2020-09-27 ENCOUNTER — Ambulatory Visit: Payer: BC Managed Care – PPO | Admitting: Internal Medicine

## 2020-09-27 NOTE — Progress Notes (Signed)
Subjective: 70 year old male presents the office today for wound check of bilateral big toes.  States that the whole foot is doing well he is back to regular shoe.  Still using Santyl on the wound on the right foot.  Denies any drainage or pus or swelling or redness.  States her left toes been doing very well.  The right wound is doing somewhat better.  There is no drainage or pus or any swelling.  Denies any fevers, chills, nausea, vomiting.  No calf pain, chest pain, shortness of breath.    Angio 07/04/2020 Impression:               #1.  Tandem 95% heavily calcified lesions within the left superficial femoral-popliteal artery successfully stented.  A 5 mm Tigris stent was placed across the joint space followed by 6 x 120 Elluvia                #2  Single-vessel runoff on the left via the peroneal artery               #3  Failed attempt at recanalization of an occluded left posterior tibial artery               #4  Occlusion of the right popliteal artery behind the knee.  The patient will be brought back for an attempt at recanalization  Angio 07/18/2020 Impression:               #1 occlusion of the right popliteal artery and high-grade stenosis of the right superficial femoral artery in the adductor canal with single-vessel runoff.  The origin of the peroneal artery had a greater than 50% stenosis.               #2 successful balloon angioplasty of the posterior tibial tibioperoneal trunk using a 3 mm balloon.  Stenting of the popliteal occlusion and superficial femoral artery stenosis using a 5 x 100 Tigris across the knee followed by overlapping 6 x 120 Elluvia   Objective: AAO x3, NAD LEFT foot: On the left foot the wound is healed the distal aspect of the toe.  No ulceration of the left foot. RIGHT foot: 5 again the wound base is still present along the distal dorsal portion of the hallux.  Appears to be somewhat improved.  There is no surrounding erythema, ascending cellulitis.  No  fluctuation, crepitation, malodor. No pain with calf compression, swelling, warmth, erythema  Assessment: 70 year old male with ulcerations bilaterally with PAD  Plan: -All treatment options discussed with the patient including all alternatives, risks, complications.  -Left foot ulceration is healed.  Continue offloading and he can continue wearing regular shoe -On the right side continue surgical shoe.  Regarding continue with Santyl dressing changes as well. -Due to the continued ulceration on the right foot will hold off returning back to work for now until the wound heals. -Follow with vascular surgery as scheduled  Return in about 2 weeks (around 10/05/2020). x-ray right foot  Trula Slade DPM

## 2020-10-03 DIAGNOSIS — C61 Malignant neoplasm of prostate: Secondary | ICD-10-CM | POA: Diagnosis not present

## 2020-10-03 DIAGNOSIS — R350 Frequency of micturition: Secondary | ICD-10-CM | POA: Diagnosis not present

## 2020-10-03 DIAGNOSIS — R3914 Feeling of incomplete bladder emptying: Secondary | ICD-10-CM | POA: Diagnosis not present

## 2020-10-04 ENCOUNTER — Ambulatory Visit (INDEPENDENT_AMBULATORY_CARE_PROVIDER_SITE_OTHER): Payer: BC Managed Care – PPO | Admitting: Internal Medicine

## 2020-10-04 ENCOUNTER — Other Ambulatory Visit: Payer: Self-pay

## 2020-10-04 ENCOUNTER — Encounter: Payer: Self-pay | Admitting: Internal Medicine

## 2020-10-04 DIAGNOSIS — M8610 Other acute osteomyelitis, unspecified site: Secondary | ICD-10-CM

## 2020-10-04 NOTE — Progress Notes (Signed)
[place] [facility] for Infectious Disease  Patient Active Problem List   Diagnosis Date Noted  . Acute osteomyelitis ([place]) 05/02/2020    Priority: High  . Open wound of right great toe 04/13/2020    Priority: High  . Open wound of left great toe 04/13/2020    Priority: High  . HLD (hyperlipidemia) 09/06/2020  . PVD (peripheral vascular disease) ([place]) 07/18/2020  . Cellulitis of toe, left 04/13/2020  . Vitamin D deficiency 10/26/2019  . Right knee pain 04/21/2019  . Aortic atherosclerosis ([place])   . PAH (pulmonary artery hypertension) ([place])   . Coronary artery calcification seen on CAT scan   . Thoracic aortic aneurysm ([place]) 10/27/2017  . Elevated PSA 10/14/2017  . Pulmonary nodule 10/14/2017  . Colon polyps 10/14/2017  . BPH (benign prostatic hyperplasia) 04/10/2017  . Abnormal CT of the abdomen 01/12/2017  . Smoker 01/12/2017  . Anemia, iron deficiency 04/02/2016  . Racing heart beat 10/03/2015  . Hyperglycemia 03/30/2015  . Anemia 03/30/2015  . Hypertension 09/25/2012  . Increased prostate specific antigen (PSA) velocity 06/22/2011  . Preventative health care 06/16/2011  . DIVERTICULOSIS, COLON 11/24/2007  . COLONIC POLYPS, HX OF 11/24/2007    Patient's Medications  New Prescriptions   No medications on file  Previous Medications   AMLODIPINE-BENAZEPRIL (LOTREL) 10-40 MG CAPSULE    TAKE 1 CAPSULE BY MOUTH  DAILY   ASPIRIN EC 81 MG TABLET    Take 81 mg by mouth daily. Swallow whole.   CLOPIDOGREL (PLAVIX) 75 MG TABLET    Take 1 tablet (75 mg total) by mouth daily.   COLLAGENASE (SANTYL) OINTMENT    Apply 1 application topically daily.   FERROUS SULFATE 325 (65 FE) MG TABLET    Take 1 tablet (325 mg total) by mouth daily with breakfast.   HYDROCHLOROTHIAZIDE (MICROZIDE) 12.5 MG CAPSULE    TAKE 1 CAPSULE BY MOUTH  DAILY   MELOXICAM (MOBIC) 15 MG TABLET    TAKE 1 TABLET BY MOUTH EVERY DAY   OMEPRAZOLE (PRILOSEC) 40 MG CAPSULE    TAKE 1 CAPSULE IN THE MORNING AND  AT BEDTIME. (INSURANCE WILL ONLY COVER ONE CAPSULE PER DAY)   ROSUVASTATIN (CRESTOR) 10 MG TABLET    Take 1 tablet (10 mg total) by mouth daily.  Modified Medications   No medications on file  Discontinued Medications   No medications on file    Subjective: Joshua Curtis is seen on a working basis.  He completed a 1 month course of amoxicillin clavulanate in December.  His bilateral great toe infections were healing well.  There was no evidence of any infection noted when he saw his podiatrist, Dr. Celesta Gentile 2 weeks ago.  Shortly after that visit he developed a little bit of swelling on his medial left great toe followed by some spontaneous drainage of pus from the distal medial nail bed.  The swelling resolved and he has not had any further drainage, unusual redness or other problems.  Review of Systems: Review of Systems  Constitutional: Negative for fever.  Musculoskeletal: Negative for joint pain.    Past Medical History:  Diagnosis Date  . Anemia, iron deficiency 04/02/2016  . Aortic atherosclerosis ([employer])   . Arthritis    rt knee  . BPH (benign prostatic hyperplasia) 04/10/2017  . COLONIC POLYPS, HX OF 11/24/2007  . COPD (chronic obstructive pulmonary disease) ([place])    pt denies this- no respiratory issues of any kind per pt  . Coronary  artery calcification seen on CAT scan   . DIVERTICULOSIS, COLON 11/24/2007  . ED (erectile dysfunction)   . GERD (gastroesophageal reflux disease)   . HLD (hyperlipidemia) 09/06/2020  . HYPERTENSION 11/24/2007  . Increased prostate specific antigen (PSA) velocity 06/22/2011  . PAH (pulmonary artery hypertension) ([facility])     Social History   Tobacco Use  . Smoking status: Current Every Day Smoker    Packs/day: 0.25    Types: Cigarettes  . Smokeless tobacco: Never Used  Vaping Use  . Vaping Use: Never used  Substance Use Topics  . Alcohol use: Yes    Comment: 1 liquor drink per day   . Drug use: No    Family History  Problem Relation Age of  Onset  . Stroke Mother   . Diabetes Father   . Hypertension Sister   . Kidney failure Brother        dialysis  . Diabetes Paternal Grandfather   . Anuerysm Brother   . Colon cancer Neg Hx   . Esophageal cancer Neg Hx   . Rectal cancer Neg Hx   . Stomach cancer Neg Hx     No Known Allergies  Objective: Vitals:   10/04/20 1113  BP: (!) 148/87  Pulse: (!) 102  Temp: 97.9 F (36.6 C)  TempSrc: Oral  Weight: 149 lb 9.6 oz (67.9 kg)   Body mass index is 21.47 kg/m.  Physical Exam Constitutional:      Comments: He is in good spirits.  Musculoskeletal:     Comments: His bilateral great toenails are still dystrophic and he has bilateral open ulcers that are still healing.  No unusual redness, swelling, warmth or drainage from either toe.       Lab Results Sed Rate (mm/h)  Date Value  04/18/2020 2   CRP (mg/L)  Date Value  05/02/2020 1.6  04/18/2020 25.0 (H)    Problem List Items Addressed This Visit      High   Acute osteomyelitis ([place])    I suspect that he may have had a simple paronychia around his left great toenail that drained spontaneously and resolved.  I suspect that the deep osteomyelitis has been cured.  Recommend simple observation for now.  He will follow-up in 2 weeks.          Michel Bickers, MD [facility] for Infectious [facility] [phone] pager   [phone] cell 10/04/2020, 11:38 AM

## 2020-10-04 NOTE — Assessment & Plan Note (Signed)
I suspect that he may have had a simple paronychia around his left great toenail that drained spontaneously and resolved.  I suspect that the deep osteomyelitis has been cured.  Recommend simple observation for now.  He will follow-up in 2 weeks.

## 2020-10-05 ENCOUNTER — Ambulatory Visit (INDEPENDENT_AMBULATORY_CARE_PROVIDER_SITE_OTHER): Payer: BC Managed Care – PPO | Admitting: Podiatry

## 2020-10-05 ENCOUNTER — Ambulatory Visit: Payer: BC Managed Care – PPO | Admitting: Internal Medicine

## 2020-10-05 ENCOUNTER — Ambulatory Visit (INDEPENDENT_AMBULATORY_CARE_PROVIDER_SITE_OTHER): Payer: BC Managed Care – PPO

## 2020-10-05 DIAGNOSIS — I739 Peripheral vascular disease, unspecified: Secondary | ICD-10-CM | POA: Diagnosis not present

## 2020-10-05 DIAGNOSIS — L97512 Non-pressure chronic ulcer of other part of right foot with fat layer exposed: Secondary | ICD-10-CM | POA: Diagnosis not present

## 2020-10-05 DIAGNOSIS — L97522 Non-pressure chronic ulcer of other part of left foot with fat layer exposed: Secondary | ICD-10-CM | POA: Diagnosis not present

## 2020-10-05 DIAGNOSIS — L97514 Non-pressure chronic ulcer of other part of right foot with necrosis of bone: Secondary | ICD-10-CM

## 2020-10-05 MED ORDER — AMOXICILLIN-POT CLAVULANATE 875-125 MG PO TABS: 1.0000 | ORAL_TABLET | Freq: Two times a day (BID) | ORAL | 0 refills | Status: DC

## 2020-10-08 NOTE — Progress Notes (Signed)
Subjective: 70 year old male presents the office today for wound check of bilateral big toes.  He states that since last appointment the left side is opened back up.  He is continuing Santyl on the wound bilaterally.  He has no pain to the toes at this time denies any significant drainage or swelling or redness.  Denies any fevers, chills, nausea, vomiting.  No calf pain, chest pain, shortness of breath.   Angio 07/04/2020 Impression:               #1.  Tandem 95% heavily calcified lesions within the left superficial femoral-popliteal artery successfully stented.  A 5 mm Tigris stent was placed across the joint space followed by 6 x 120 Elluvia                #2  Single-vessel runoff on the left via the peroneal artery               #3  Failed attempt at recanalization of an occluded left posterior tibial artery               #4  Occlusion of the right popliteal artery behind the knee.  The patient will be brought back for an attempt at recanalization  Angio 07/18/2020 Impression:               #1 occlusion of the right popliteal artery and high-grade stenosis of the right superficial femoral artery in the adductor canal with single-vessel runoff.  The origin of the peroneal artery had a greater than 50% stenosis.               #2 successful balloon angioplasty of the posterior tibial tibioperoneal trunk using a 3 mm balloon.  Stenting of the popliteal occlusion and superficial femoral artery stenosis using a 5 x 100 Tigris across the knee followed by overlapping 6 x 120 Elluvia   Objective: AAO x3, NAD LEFT foot: The wound is opened back up with a fibrogranular wound base.  There is no exposed bone or probing to bone.  No undermining or tunneling.  No edema, erythema. RIGHT foot: Again the wound base is still present along the distal dorsal portion of the hallux.  Minimal improvement in the wound today.  There is no surrounding erythema, ascending cellulitis.  No fluctuation, crepitation,  malodor. No pain with calf compression, swelling, warmth, erythema  Assessment: 70 year old male with ulcerations bilaterally with PAD  Plan: -All treatment options discussed with the patient including all alternatives, risks, complications.  -Repeat x-rays obtained and reviewed.  Some chronic changes present at the distal phalanx bilaterally but no evidence of acute osteomyelitis.  No soft tissue edema. -Continue Santyl dressing changes bilaterally. -Given the worsening of the left foot, to prescribe Augmentin. -Continue surgical shoe, elevation of the -Offloading  -Referral to the wound care center.    Trula Slade DPM

## 2020-10-09 ENCOUNTER — Other Ambulatory Visit: Payer: Self-pay

## 2020-10-10 ENCOUNTER — Encounter: Payer: Self-pay | Admitting: Internal Medicine

## 2020-10-10 ENCOUNTER — Ambulatory Visit (INDEPENDENT_AMBULATORY_CARE_PROVIDER_SITE_OTHER): Payer: BC Managed Care – PPO | Admitting: Internal Medicine

## 2020-10-10 DIAGNOSIS — I10 Essential (primary) hypertension: Secondary | ICD-10-CM

## 2020-10-10 DIAGNOSIS — C61 Malignant neoplasm of prostate: Secondary | ICD-10-CM

## 2020-10-10 DIAGNOSIS — R739 Hyperglycemia, unspecified: Secondary | ICD-10-CM | POA: Diagnosis not present

## 2020-10-10 NOTE — Assessment & Plan Note (Signed)
Lab Results  Component Value Date   HGBA1C 4.5 Repeated and verified X2. (L) 09/06/2020   Stable, pt to continue current medical treatment  - diet

## 2020-10-10 NOTE — Progress Notes (Signed)
Patient ID: Joshua Curtis, male   DOB: 11-23-1950, 70 y.o.   MRN: [id number]        Chief Complaint: follow up recent dx prostate cancer, hyperglycemia, htn, hld       HPI:  Joshua Curtis is a 70 y.o. male here with wife with c/o recent dx prostate ca, trying to decide essentially between robot laparoscopic prostatectomy vs seed radiation vs other.  Pt denies chest pain, increased sob or doe, wheezing, orthopnea, PND, increased LE swelling, palpitations, dizziness or syncope.   Pt denies polydipsia, polyuria, Pt denies new neurological symptoms such as new headache, or facial or extremity weakness or numbness     .        Wt Readings from Last 3 Encounters:  10/10/20 151 lb (68.5 kg)  10/04/20 149 lb 9.6 oz (67.9 kg)  09/06/20 157 lb (71.2 kg)   BP Readings from Last 3 Encounters:  10/10/20 124/68  10/04/20 (!) 148/87  09/06/20 120/70         Past Medical History:  Diagnosis Date  . Anemia, iron deficiency 04/02/2016  . Aortic atherosclerosis ([place])   . Arthritis    rt knee  . BPH (benign prostatic hyperplasia) 04/10/2017  . COLONIC POLYPS, HX OF 11/24/2007  . COPD (chronic obstructive pulmonary disease) ([place])    pt denies this- no respiratory issues of any kind per pt  . Coronary artery calcification seen on CAT scan   . DIVERTICULOSIS, COLON 11/24/2007  . ED (erectile dysfunction)   . GERD (gastroesophageal reflux disease)   . HLD (hyperlipidemia) 09/06/2020  . HYPERTENSION 11/24/2007  . Increased prostate specific antigen (PSA) velocity 06/22/2011  . PAH (pulmonary artery hypertension) ([place])    Past Surgical History:  Procedure Laterality Date  . ABDOMINAL AORTOGRAM W/LOWER EXTREMITY N/A 07/04/2020   Procedure: ABDOMINAL AORTOGRAM W/LOWER EXTREMITY;  Surgeon: Serafina Mitchell, MD;  Location: [facility];  Service: Cardiovascular;  Laterality: N/A;  . ABDOMINAL AORTOGRAM W/LOWER EXTREMITY N/A 07/18/2020   Procedure: ABDOMINAL AORTOGRAM W/LOWER EXTREMITY;  Surgeon:  Serafina Mitchell, MD;  Location: [facility];  Service: Cardiovascular;  Laterality: N/A;  . COLONOSCOPY    . NO PAST SURGERIES    . PERIPHERAL VASCULAR BALLOON ANGIOPLASTY Right 07/18/2020   Procedure: PERIPHERAL VASCULAR BALLOON ANGIOPLASTY;  Surgeon: Serafina Mitchell, MD;  Location: [facility];  Service: Cardiovascular;  Laterality: Right;  tibioperoneal trunk and posterior tibial  . PERIPHERAL VASCULAR INTERVENTION  07/04/2020   Procedure: PERIPHERAL VASCULAR INTERVENTION;  Surgeon: Serafina Mitchell, MD;  Location: [facility];  Service: Cardiovascular;;  Lt. SFA and Popliteal  . PERIPHERAL VASCULAR INTERVENTION Right 07/18/2020   Procedure: PERIPHERAL VASCULAR INTERVENTION;  Surgeon: Serafina Mitchell, MD;  Location: [facility];  Service: Cardiovascular;  Laterality: Right;  Femoral Popliteal    reports that he has been smoking cigarettes. He has been smoking about 0.25 packs per day. He has never used smokeless tobacco. He reports current alcohol use. He reports that he does not use drugs. family history includes Anuerysm in his brother; Diabetes in his father and paternal grandfather; Hypertension in his sister; Kidney failure in his brother; Stroke in his mother. No Known Allergies Current Outpatient Medications on File Prior to Visit  Medication Sig Dispense Refill  . amLODipine-benazepril (LOTREL) 10-40 MG capsule TAKE 1 CAPSULE BY MOUTH  DAILY 90 capsule 3  . amoxicillin-clavulanate (AUGMENTIN) 875-125 MG tablet Take 1 tablet by mouth 2 (two) times  daily. 20 tablet 0  . aspirin EC 81 MG tablet Take 81 mg by mouth daily. Swallow whole.    . clopidogrel (PLAVIX) 75 MG tablet Take 1 tablet (75 mg total) by mouth daily. 30 tablet 11  . collagenase (SANTYL) ointment Apply 1 application topically daily. (Patient taking differently: Apply 1 application topically daily. Applied toes daily) 15 g 0  . ferrous sulfate 325 (65 FE) MG tablet Take 1 tablet (325 mg total)  by mouth daily with breakfast. 90 tablet 0  . hydrochlorothiazide (MICROZIDE) 12.5 MG capsule TAKE 1 CAPSULE BY MOUTH  DAILY 90 capsule 3  . meloxicam (MOBIC) 15 MG tablet TAKE 1 TABLET BY MOUTH EVERY DAY (Patient taking differently: Take 15 mg by mouth daily.) 90 tablet 1  . omeprazole (PRILOSEC) 40 MG capsule TAKE 1 CAPSULE IN THE MORNING AND AT BEDTIME. (INSURANCE WILL ONLY COVER ONE CAPSULE PER DAY) 30 capsule 1  . rosuvastatin (CRESTOR) 10 MG tablet Take 1 tablet (10 mg total) by mouth daily. 30 tablet 11   No current facility-administered medications on file prior to visit.        ROS:  All others reviewed and negative.  Objective        PE:  BP 124/68   Pulse 96   Temp 98 F (36.7 C) (Oral)   Ht 5\' 10"  (1.778 m)   Wt 151 lb (68.5 kg)   SpO2 99%   BMI 21.67 kg/m                 Constitutional: Pt appears in NAD               HENT: Head: NCAT.                Right Ear: External ear normal.                 Left Ear: External ear normal.                Eyes: . Pupils are equal, round, and reactive to light. Conjunctivae and EOM are normal               Nose: without d/c or deformity               Neck: Neck supple. Gross normal ROM               Cardiovascular: Normal rate and regular rhythm.                 Pulmonary/Chest: Effort normal and breath sounds without rales or wheezing.                Abd:  Soft, NT, ND, + BS, no organomegaly               Neurological: Pt is alert. At baseline orientation, motor grossly intact               Skin: Skin is warm. No rashes, no other new lesions, LE edema - none               Psychiatric: Pt behavior is normal without agitation   Micro: none  Cardiac tracings I have personally interpreted today:  none  Pertinent Radiological findings (summarize): none   Lab Results  Component Value Date   WBC 4.7 09/06/2020   HGB 11.5 (L) 09/06/2020   HCT 34.9 (L) 09/06/2020   PLT 301.0 09/06/2020   GLUCOSE 94 09/06/2020   CHOL 110  09/06/2020   TRIG 92.0 09/06/2020   HDL 36.00 (L) 09/06/2020   LDLDIRECT 74.0 04/21/2019   LDLCALC 55 09/06/2020   ALT 27 09/06/2020   AST 25 09/06/2020   NA 139 09/06/2020   K 3.7 09/06/2020   CL 102 09/06/2020   CREATININE 1.05 09/06/2020   BUN 13 09/06/2020   CO2 31 09/06/2020   TSH 1.15 09/06/2020   PSA 8.31 (H) 10/26/2019   HGBA1C 4.5 Repeated and verified X2. (L) 09/06/2020   Assessment/Plan:  Joshua Curtis is a 70 y.o. Black or African American [2] male with  has a past medical history of Anemia, iron deficiency (04/02/2016), Aortic atherosclerosis ([place]), Arthritis, BPH (benign prostatic hyperplasia) (04/10/2017), COLONIC POLYPS, HX OF (11/24/2007), COPD (chronic obstructive pulmonary disease) ([place]), Coronary artery calcification seen on CAT scan, DIVERTICULOSIS, COLON (11/24/2007), ED (erectile dysfunction), GERD (gastroesophageal reflux disease), HLD (hyperlipidemia) (09/06/2020), HYPERTENSION (11/24/2007), Increased prostate specific antigen (PSA) velocity (06/22/2011), and PAH (pulmonary artery hypertension) ([place]).  Prostate CA [facility]) D/w pt, advised to further f/u with radiation oncology and urology as planned  Hyperglycemia Lab Results  Component Value Date   HGBA1C 4.5 Repeated and verified X2. (L) 09/06/2020   Stable, pt to continue current medical treatment  - diet   Hypertension BP Readings from Last 3 Encounters:  10/10/20 124/68  10/04/20 (!) 148/87  09/06/20 120/70   Stable, pt to continue medical treatment  - lotrel, hct   Followup: Return if symptoms worsen or fail to improve.  Cathlean Cower, MD 10/10/2020 8:53 PM [facility]

## 2020-10-10 NOTE — Assessment & Plan Note (Signed)
D/w pt, advised to further f/u with radiation oncology and urology as planned

## 2020-10-10 NOTE — Assessment & Plan Note (Signed)
BP Readings from Last 3 Encounters:  10/10/20 124/68  10/04/20 (!) 148/87  09/06/20 120/70   Stable, pt to continue medical treatment  - lotrel, hct

## 2020-10-10 NOTE — Patient Instructions (Signed)
Please continue all other medications as before, and refills have been done if requested.  Please have the pharmacy call with any other refills you may need.  Please continue your efforts at being more active, low cholesterol diet, and weight control.  Please keep your appointments with your specialists as you may have planned     

## 2020-10-18 ENCOUNTER — Other Ambulatory Visit: Payer: Self-pay

## 2020-10-18 ENCOUNTER — Telehealth (INDEPENDENT_AMBULATORY_CARE_PROVIDER_SITE_OTHER): Payer: BC Managed Care – PPO | Admitting: Internal Medicine

## 2020-10-18 ENCOUNTER — Encounter: Payer: Self-pay | Admitting: Internal Medicine

## 2020-10-18 DIAGNOSIS — M8610 Other acute osteomyelitis, unspecified site: Secondary | ICD-10-CM

## 2020-10-18 NOTE — Progress Notes (Signed)
Virtual Visit via Telephone Note  I connected with Joshua Curtis on 10/18/20 at 10:30 AM EST by telephone and verified that I am speaking with the correct person using two identifiers.  Location: Patient: Home Provider: [facility]   I discussed the limitations, risks, security and privacy concerns of performing an evaluation and management service by telephone and the availability of in person appointments. I also discussed with the patient that there may be a patient responsible charge related to this service. The patient expressed understanding and agreed to proceed.   History of Present Illness: I called and spoke with Joshua Curtis by phone today.  He tells me that the ulcers on his bilateral great toes are healing slowly.  He has had no more drainage of pus since just before his last visit with me.  He continues to use topical ointment under the direction of his podiatrist, Dr. Jacqualyn Posey.   Observations/Objective:   Assessment and Plan: He is not having any signs or symptoms to suggest active infection.  Follow Up Instructions: Follow-up here as needed   I discussed the assessment and treatment plan with the patient. The patient was provided an opportunity to ask questions and all were answered. The patient agreed with the plan and demonstrated an understanding of the instructions.   The patient was advised to call back or seek an in-person evaluation if the symptoms worsen or if the condition fails to improve as anticipated.  I provided 15 minutes of non-face-to-face time during this encounter.   Michel Bickers, MD

## 2020-10-19 ENCOUNTER — Encounter (HOSPITAL_BASED_OUTPATIENT_CLINIC_OR_DEPARTMENT_OTHER): Payer: BC Managed Care – PPO | Attending: Internal Medicine | Admitting: Internal Medicine

## 2020-10-19 ENCOUNTER — Other Ambulatory Visit: Payer: Self-pay

## 2020-10-19 DIAGNOSIS — L97524 Non-pressure chronic ulcer of other part of left foot with necrosis of bone: Secondary | ICD-10-CM | POA: Diagnosis not present

## 2020-10-19 DIAGNOSIS — F17210 Nicotine dependence, cigarettes, uncomplicated: Secondary | ICD-10-CM | POA: Diagnosis not present

## 2020-10-19 DIAGNOSIS — I70235 Atherosclerosis of native arteries of right leg with ulceration of other part of foot: Secondary | ICD-10-CM | POA: Insufficient documentation

## 2020-10-19 DIAGNOSIS — L97512 Non-pressure chronic ulcer of other part of right foot with fat layer exposed: Secondary | ICD-10-CM | POA: Diagnosis not present

## 2020-10-19 DIAGNOSIS — I70245 Atherosclerosis of native arteries of left leg with ulceration of other part of foot: Secondary | ICD-10-CM | POA: Insufficient documentation

## 2020-10-20 ENCOUNTER — Telehealth: Payer: Self-pay | Admitting: Radiation Oncology

## 2020-10-20 NOTE — Progress Notes (Signed)
Joshua Curtis, Joshua Curtis ([id number]) Visit Report for 10/19/2020 Allergy List Details Patient Name: Date of Service: TYCEN, DOCKTER 10/19/2020 2:45 PM Medical Record Number: [id number] Patient Account Number: [id number] Date of Birth/Sex: Treating RN: 21-Nov-1950 (70 y.o. [name] Primary Care Aysiah Jurado: Cathlean Cower Other Clinician: Referring Grace Haggart: Treating Annikah Lovins/Extender: Jeri Modena in Treatment: 0 Allergies Active Allergies No Known Drug Allergies Type: Allergen Allergy Notes Electronic Signature(s) Signed: 10/20/2020 4:25:56 PM By: Levan Hurst RN, BSN Entered By: Levan Hurst on 10/19/2020 15:38:58 -------------------------------------------------------------------------------- Arrival Information Details Patient Name: Date of Service: Joshua Curtis 10/19/2020 2:45 PM Medical Record Number: [id number] Patient Account Number: [id number] Date of Birth/Sex: Treating RN: Mar 18, 1951 (70 y.o. [name] Primary Care Chioma Mukherjee: Cathlean Cower Other Clinician: Referring Nuriyah Hanline: Treating Vaniyah Lansky/Extender: Jeri Modena in Treatment: 0 Visit Information Patient Arrived: Ambulatory Arrival Time: 15:24 Accompanied By: alone Transfer Assistance: None Patient Identification Verified: Yes Secondary Verification Process Completed: Yes Patient Requires Transmission-Based Precautions: No Patient Has Alerts: Yes Patient Alerts: Patient on Blood Thinner L ABI: 0.91 R ABI: 0.89 (08/2020) Electronic Signature(s) Signed: 10/20/2020 4:25:56 PM By: Levan Hurst RN, BSN Entered By: Levan Hurst on 10/19/2020 15:37:47 -------------------------------------------------------------------------------- Clinic Level of Care Assessment Details Patient Name: Date of Service: Joshua Curtis 10/19/2020 2:45 PM Medical Record Number: [id number] Patient Account Number: [id number] Date of Birth/Sex: Treating RN: 1950/10/08 (70 y.o.  [name] Primary Care Hoa Deriso: Cathlean Cower Other Clinician: Referring Aarsh Fristoe: Treating Chai Verdejo/Extender: Jeri Modena in Treatment: 0 Clinic Level of Care Assessment Items TOOL 2 Quantity Score X- 1 0 Use when only an EandM is performed on the INITIAL visit ASSESSMENTS - Nursing Assessment / Reassessment X- 1 20 General Physical Exam (combine w/ comprehensive assessment (listed just below) when performed on new pt. evals) X- 1 25 Comprehensive Assessment (HX, ROS, Risk Assessments, Wounds Hx, etc.) ASSESSMENTS - Wound and Skin A ssessment / Reassessment []  - 0 Simple Wound Assessment / Reassessment - one wound X- 2 5 Complex Wound Assessment / Reassessment - multiple wounds X- 1 10 Dermatologic / Skin Assessment (not related to wound area) ASSESSMENTS - Ostomy and/or Continence Assessment and Care []  - 0 Incontinence Assessment and Management []  - 0 Ostomy Care Assessment and Management (repouching, etc.) PROCESS - Coordination of Care []  - 0 Simple Patient / Family Education for ongoing care X- 1 20 Complex (extensive) Patient / Family Education for ongoing care X- 1 10 Staff obtains Programmer, systems, Records, T Results / Process Orders est []  - 0 Staff telephones HHA, Nursing Homes / Clarify orders / etc []  - 0 Routine Transfer to another Facility (non-emergent condition) []  - 0 Routine Hospital Admission (non-emergent condition) []  - 0 New Admissions / Biomedical engineer / Ordering NPWT Apligraf, etc. , []  - 0 Emergency Hospital Admission (emergent condition) []  - 0 Simple Discharge Coordination X- 1 15 Complex (extensive) Discharge Coordination PROCESS - Special Needs []  - 0 Pediatric / Minor Patient Management []  - 0 Isolation Patient Management []  - 0 Hearing / Language / Visual special needs []  - 0 Assessment of Community assistance (transportation, D/C planning, etc.) []  - 0 Additional assistance / Altered  mentation []  - 0 Support Surface(s) Assessment (bed, cushion, seat, etc.) INTERVENTIONS - Wound Cleansing / Measurement X- 1 5 Wound Imaging (photographs - any number of wounds) []  - 0 Wound Tracing (instead of photographs) []  - 0 Simple Wound Measurement - one wound X- 2 5 Complex Wound Measurement - multiple wounds []  -  0 Simple Wound Cleansing - one wound X- 2 5 Complex Wound Cleansing - multiple wounds INTERVENTIONS - Wound Dressings X - Small Wound Dressing one or multiple wounds 2 10 []  - 0 Medium Wound Dressing one or multiple wounds []  - 0 Large Wound Dressing one or multiple wounds []  - 0 Application of Medications - injection INTERVENTIONS - Miscellaneous []  - 0 External ear exam []  - 0 Specimen Collection (cultures, biopsies, blood, body fluids, etc.) []  - 0 Specimen(s) / Culture(s) sent or taken to Lab for analysis []  - 0 Patient Transfer (multiple staff / Civil Service fast streamer / Similar devices) []  - 0 Simple Staple / Suture removal (25 or less) []  - 0 Complex Staple / Suture removal (26 or more) []  - 0 Hypo / Hyperglycemic Management (close monitor of Blood Glucose) []  - 0 Ankle / Brachial Index (ABI) - do not check if billed separately Has the patient been seen at the hospital within the last three years: Yes Total Score: 155 Level Of Care: New/Established - Level 4 Electronic Signature(s) Signed: 10/19/2020 5:51:10 PM By: Deon Pilling Entered By: Deon Pilling on 10/19/2020 17:24:30 -------------------------------------------------------------------------------- Encounter Discharge Information Details Patient Name: Date of Service: Joshua Curtis 10/19/2020 2:45 PM Medical Record Number: [id number] Patient Account Number: [id number] Date of Birth/Sex: Treating RN: Jul 29, 1951 (70 y.o. [name] Primary Care Samy Ryner: Cathlean Cower Other Clinician: Referring Avonna Iribe: Treating Ismelda Weatherman/Extender: Jeri Modena in Treatment:  0 Encounter Discharge Information Items Post Procedure Vitals Discharge Condition: Stable Temperature (F): 98.7 Ambulatory Status: Ambulatory Pulse (bpm): 101 Discharge Destination: Home Respiratory Rate (breaths/min): 16 Transportation: Private Auto Blood Pressure (mmHg): 117/71 Accompanied By: alone Schedule Follow-up Appointment: Yes Clinical Summary of Care: Patient Declined Electronic Signature(s) Signed: 10/20/2020 4:25:56 PM By: Levan Hurst RN, BSN Entered By: Levan Hurst on 10/19/2020 16:54:17 -------------------------------------------------------------------------------- Lower Extremity Assessment Details Patient Name: Date of Service: Joshua Curtis 10/19/2020 2:45 PM Medical Record Number: [id number] Patient Account Number: [id number] Date of Birth/Sex: Treating RN: 24-Aug-1951 (70 y.o. [name] Primary Care Remi Rester: Cathlean Cower Other Clinician: Referring Terrion Gencarelli: Treating Angelize Ryce/Extender: Jeri Modena in Treatment: 0 Edema Assessment Assessed: Shirlyn Goltz: No] Patrice Paradise: No] Edema: [Left: No] [Right: No] Calf Left: Right: Point of Measurement: 33 cm From Medial Instep 28 cm 30 cm Ankle Left: Right: Point of Measurement: 11 cm From Medial Instep 20.4 cm 20 cm Vascular Assessment Pulses: Dorsalis Pedis Palpable: [Left:Yes] [Right:Yes] Electronic Signature(s) Signed: 10/20/2020 4:25:56 PM By: Levan Hurst RN, BSN Entered By: Levan Hurst on 10/19/2020 15:51:02 -------------------------------------------------------------------------------- Multi Wound Chart Details Patient Name: Date of Service: Joshua Curtis 10/19/2020 2:45 PM Medical Record Number: [id number] Patient Account Number: [id number] Date of Birth/Sex: Treating RN: 03/27/1951 (70 y.o. [name] Primary Care Aadith Raudenbush: Cathlean Cower Other Clinician: Referring Shakelia Scrivner: Treating Peityn Payton/Extender: Jeri Modena in Treatment:  0 Vital Signs Height(in): 70 Pulse(bpm): 101 Weight(lbs): 150 Blood Pressure(mmHg): 117/71 Body Mass Index(BMI): 22 Temperature(F): 98.7 Respiratory Rate(breaths/min): 16 Photos: [1:No Photos Right T Great oe] [2:No Photos Left T Great oe] [N/A:N/A N/A] Wound Location: [1:Gradually Appeared] [2:Gradually Appeared] [N/A:N/A] Wounding Event: [1:Arterial Insufficiency Ulcer] [2:Arterial Insufficiency Ulcer] [N/A:N/A] Primary Etiology: [1:Hypertension, Peripheral Arterial] [2:Hypertension, Peripheral Arterial] [N/A:N/A] Comorbid History: [1:Disease, Peripheral Venous Disease 03/02/2020] [2:Disease, Peripheral Venous Disease 03/02/2020] [N/A:N/A] Date Acquired: [1:0] [2:0] [N/A:N/A] Weeks of Treatment: [1:Open] [2:Open] [N/A:N/A] Wound Status: [1:0.9x0.4x0.1] [2:0.4x0.4x0.2] [N/A:N/A] Measurements L x W x D (cm) [1:0.283] [2:0.126] [N/A:N/A] A (cm) : rea [1:0.028] [2:0.025] [N/A:N/A] Volume (cm) : [1:N/A] [  2:0.00%] [N/A:N/A] % Reduction in Area: [1:N/A] [2:0.00%] [N/A:N/A] % Reduction in Volume: [1:Full Thickness Without Exposed] [2:Full Thickness With Exposed Support] [N/A:N/A] Classification: [1:Support Structures Medium] [2:Structures Medium] [N/A:N/A] Exudate Amount: [1:Serosanguineous] [2:Serosanguineous] [N/A:N/A] Exudate Type: [1:red, brown] [2:red, brown] [N/A:N/A] Exudate Color: [1:Flat and Intact] [2:Flat and Intact] [N/A:N/A] Wound Margin: [1:Large (67-100%)] [2:Large (67-100%)] [N/A:N/A] Granulation Amount: [1:Pink] [2:Red] [N/A:N/A] Granulation Quality: [1:Small (1-33%)] [2:Small (1-33%)] [N/A:N/A] Necrotic Amount: [1:Fat Layer (Subcutaneous Tissue): Yes Fat Layer (Subcutaneous Tissue): Yes N/A] Exposed Structures: [1:Fascia: No Tendon: No Muscle: No Joint: No Bone: No None] [2:Bone: Yes Fascia: No Tendon: No Muscle: No Joint: No None] [N/A:N/A] Epithelialization: [1:Chemical/Enzymatic/Mechanical] [2:Chemical/Enzymatic/Mechanical] [N/A:N/A] Debridement: [1:N/A] [2:N/A]  [N/A:N/A] Instrument: [1:None] [2:None] [N/A:N/A] Bleeding: Debridement Treatment Response: Procedure was tolerated well [2:Procedure was tolerated well] [N/A:N/A] Post Debridement Measurements L x 0.9x0.4x0.1 [2:0.4x0.4x0.2] [N/A:N/A] W x D (cm) [1:0.028] [2:0.025] [N/A:N/A] Post Debridement Volume: (cm) [1:Debridement] [2:Debridement] [N/A:N/A] Treatment Notes Electronic Signature(s) Signed: 10/19/2020 5:29:55 PM By: Linton Ham MD Signed: 10/19/2020 5:51:10 PM By: Deon Pilling Entered By: Linton Ham on 10/19/2020 16:33:56 -------------------------------------------------------------------------------- Multi-Disciplinary Care Plan Details Patient Name: Date of Service: Joshua Curtis 10/19/2020 2:45 PM Medical Record Number: [id number] Patient Account Number: [id number] Date of Birth/Sex: Treating RN: 1950/10/28 (70 y.o. [name], [name] Primary Care Sally Menard: Cathlean Cower Other Clinician: Referring Casey Maxfield: Treating Kris No/Extender: Jeri Modena in Treatment: 0 Active Inactive Abuse / Safety / Falls / Self Care Management Nursing Diagnoses: Potential for falls Goals: Patient will remain injury free related to falls Date Initiated: 10/19/2020 Target Resolution Date: 12/15/2020 Goal Status: Active Interventions: Provide education on fall prevention Notes: Orientation to the Wound Care Program Nursing Diagnoses: Knowledge deficit related to the wound healing center program Goals: Patient/caregiver will verbalize understanding of the [place] Date Initiated: 10/19/2020 Target Resolution Date: 11/09/2020 Goal Status: Active Interventions: Provide education on orientation to the wound center Notes: Pain, Acute or Chronic Nursing Diagnoses: Pain, acute or chronic: actual or potential Potential alteration in comfort, pain Goals: Patient will verbalize adequate pain control and receive pain control interventions during  procedures as needed Date Initiated: 10/19/2020 Target Resolution Date: 11/10/2020 Goal Status: Active Patient/caregiver will verbalize comfort level met Date Initiated: 10/19/2020 Target Resolution Date: 11/10/2020 Goal Status: Active Interventions: Encourage patient to take pain medications as prescribed Provide education on pain management Notes: Wound/Skin Impairment Nursing Diagnoses: Knowledge deficit related to ulceration/compromised skin integrity Goals: Patient/caregiver will verbalize understanding of skin care regimen Date Initiated: 10/19/2020 Target Resolution Date: 11/10/2020 Goal Status: Active Interventions: Assess patient/caregiver ability to obtain necessary supplies Assess patient/caregiver ability to perform ulcer/skin care regimen upon admission and as needed Assess ulceration(s) every visit Provide education on smoking Provide education on ulcer and skin care Treatment Activities: Skin care regimen initiated : 10/19/2020 Topical wound management initiated : 10/19/2020 Notes: Electronic Signature(s) Signed: 10/19/2020 5:51:10 PM By: Deon Pilling Entered By: Deon Pilling on 10/19/2020 16:17:55 -------------------------------------------------------------------------------- Pain Assessment Details Patient Name: Date of Service: DESHAY, BLUMENFELD 10/19/2020 2:45 PM Medical Record Number: [id number] Patient Account Number: [id number] Date of Birth/Sex: Treating RN: May 19, 1951 (70 y.o. [name] Primary Care Nneoma Harral: Cathlean Cower Other Clinician: Referring Ramia Sidney: Treating Appollonia Klee/Extender: Jeri Modena in Treatment: 0 Active Problems Location of Pain Severity and Description of Pain Patient Has Paino No Site Locations Pain Management and Medication Current Pain Management: Electronic Signature(s) Signed: 10/20/2020 4:25:56 PM By: Levan Hurst RN, BSN Entered By: Levan Hurst on 10/19/2020  15:55:14 -------------------------------------------------------------------------------- Patient/Caregiver Education Details Patient Name:  Date of Service: DARRIN, KOMAN 2/17/2022andnbsp2:45 PM Medical Record Number: [id number] Patient Account Number: [id number] Date of Birth/Gender: Treating RN: 1951/03/06 (70 y.o. [name] Primary Care Physician: Cathlean Cower Other Clinician: Referring Physician: Treating Physician/Extender: Jeri Modena in Treatment: 0 Education Assessment Education Provided To: Patient Education Topics Provided Welcome T [facility] [place]: o Handouts: Welcome T [facility] [place] o Methods: Explain/Verbal, Printed Responses: Reinforcements needed Wound/Skin Impairment: Handouts: Caring for Your Ulcer, Skin Care Do's and Dont's, Smoking and Wound Healing Methods: Explain/Verbal, Printed Responses: Reinforcements needed Electronic Signature(s) Signed: 10/19/2020 5:51:10 PM By: Deon Pilling Entered By: Deon Pilling on 10/19/2020 16:18:27 -------------------------------------------------------------------------------- Wound Assessment Details Patient Name: Date of Service: DEZMAN, GRANDA 10/19/2020 2:45 PM Medical Record Number: [id number] Patient Account Number: [id number] Date of Birth/Sex: Treating RN: 1951-07-27 (70 y.o. [name] Primary Care Twana Wileman: Cathlean Cower Other Clinician: Referring Zaia Carre: Treating Ronne Savoia/Extender: Jeri Modena in Treatment: 0 Wound Status Wound Number: 1 Primary Arterial Insufficiency Ulcer Etiology: Wound Location: Right T Great oe Wound Status: Open Wounding Event: Gradually Appeared Comorbid Hypertension, Peripheral Arterial Disease, Peripheral Venous Date Acquired: 03/02/2020 History: Disease Weeks Of Treatment: 0 Clustered Wound: No Wound Measurements Length: (cm) 0.9 Width: (cm) 0.4 Depth: (cm) 0.1 Area: (cm) 0.283 Volume:  (cm) 0.028 % Reduction in Area: % Reduction in Volume: Epithelialization: None Tunneling: No Undermining: No Wound Description Classification: Full Thickness Without Exposed Support Structures Wound Margin: Flat and Intact Exudate Amount: Medium Exudate Type: Serosanguineous Exudate Color: red, brown Foul Odor After Cleansing: No Slough/Fibrino Yes Wound Bed Granulation Amount: Large (67-100%) Exposed Structure Granulation Quality: Pink Fascia Exposed: No Necrotic Amount: Small (1-33%) Fat Layer (Subcutaneous Tissue) Exposed: Yes Necrotic Quality: Adherent Slough Tendon Exposed: No Muscle Exposed: No Joint Exposed: No Bone Exposed: No Treatment Notes Wound #1 (Toe Great) Wound Laterality: Right Cleanser Soap and Water Discharge Instruction: May shower and wash wound with dial antibacterial soap and water prior to dressing change. Peri-Wound Care Topical Primary Dressing Endoform 2x2 in Discharge Instruction: Moisten with [place] Jelly. Secondary Dressing Woven Gauze Sponges 2x2 in Discharge Instruction: Apply over primary dressing as directed. Secured With Conforming Stretch Gauze Bandage, Sterile 2x75 (in/in) Discharge Instruction: Secure with stretch gauze as directed. Compression Wrap Compression Stockings Add-Ons Electronic Signature(s) Signed: 10/20/2020 4:25:56 PM By: Levan Hurst RN, BSN Entered By: Levan Hurst on 10/19/2020 15:54:11 -------------------------------------------------------------------------------- Wound Assessment Details Patient Name: Date of Service: Joshua Curtis 10/19/2020 2:45 PM Medical Record Number: [id number] Patient Account Number: [id number] Date of Birth/Sex: Treating RN: [birth year] (70 y.o. [name] Primary Care Ingeborg Fite: Cathlean Cower Other Clinician: Referring Hallee Mckenny: Treating Tatjana Turcott/Extender: Jeri Modena in Treatment: 0 Wound Status Wound Number: 2 Primary Arterial Insufficiency  Ulcer Etiology: Wound Location: Left T Great oe Wound Status: Open Wounding Event: Gradually Appeared Comorbid Hypertension, Peripheral Arterial Disease, Peripheral Venous Date Acquired: 03/02/2020 History: Disease Weeks Of Treatment: 0 Clustered Wound: No Wound Measurements Length: (cm) 0.4 Width: (cm) 0.4 Depth: (cm) 0.2 Area: (cm) 0.126 Volume: (cm) 0.025 % Reduction in Area: 0% % Reduction in Volume: 0% Epithelialization: None Tunneling: No Undermining: No Wound Description Classification: Full Thickness With Exposed Support Structures Wound Margin: Flat and Intact Exudate Amount: Medium Exudate Type: Serosanguineous Exudate Color: red, brown Foul Odor After Cleansing: No Slough/Fibrino Yes Wound Bed Granulation Amount: Large (67-100%) Exposed Structure Granulation Quality: Red Fascia Exposed: No Necrotic Amount: Small (1-33%) Fat Layer (Subcutaneous Tissue) Exposed: Yes Necrotic Quality: Adherent Slough Tendon  Exposed: No Muscle Exposed: No Joint Exposed: No Bone Exposed: Yes Treatment Notes Wound #2 (Toe Great) Wound Laterality: Left Cleanser Soap and Water Discharge Instruction: May shower and wash wound with dial antibacterial soap and water prior to dressing change. Peri-Wound Care Topical Primary Dressing Endoform 2x2 in Discharge Instruction: Moisten with [place] Jelly. Secondary Dressing Woven Gauze Sponges 2x2 in Discharge Instruction: Apply over primary dressing as directed. Secured With Conforming Stretch Gauze Bandage, Sterile 2x75 (in/in) Discharge Instruction: Secure with stretch gauze as directed. Compression Wrap Compression Stockings Add-Ons Electronic Signature(s) Signed: 10/19/2020 5:51:10 PM By: Deon Pilling Signed: 10/20/2020 4:25:56 PM By: Levan Hurst RN, BSN Entered By: Deon Pilling on 10/19/2020 16:17:11 -------------------------------------------------------------------------------- Vitals Details Patient Name: Date of  Service: Joshua Curtis 10/19/2020 2:45 PM Medical Record Number: [id number] Patient Account Number: [id number] Date of Birth/Sex: Treating RN: 02-13-1951 (70 y.o. [name] Primary Care Kiah Vanalstine: Cathlean Cower Other Clinician: Referring Itzell Bendavid: Treating Dylan Ruotolo/Extender: Jeri Modena in Treatment: 0 Vital Signs Time Taken: 15:25 Temperature (F): 98.7 Height (in): 70 Pulse (bpm): 101 Source: Stated Respiratory Rate (breaths/min): 16 Weight (lbs): 150 Blood Pressure (mmHg): 117/71 Source: Stated Reference Range: 80 - 120 mg / dl Body Mass Index (BMI): 21.5 Electronic Signature(s) Signed: 10/20/2020 4:25:56 PM By: Levan Hurst RN, BSN Entered By: Levan Hurst on 10/19/2020 15:38:49

## 2020-10-20 NOTE — Telephone Encounter (Signed)
Patient had a consult appt with Dr. Tammi Klippel on 2/22 at 8am. He has just now called. Patient has decided on a prostatectomy. I have let our team know and will send an email to [employer] at [employer].

## 2020-10-20 NOTE — Progress Notes (Signed)
Joshua Curtis, Joshua Curtis ([id number]) Visit Report for 10/19/2020 Chief Complaint Document Details Patient Name: Date of Service: Joshua Curtis, Joshua Curtis 10/19/2020 2:45 PM Medical Record Number: [id number] Patient Account Number: [id number] Date of Birth/Sex: Treating RN: Feb 08, 1951 (70 y.o. Joshua Curtis Primary Care Provider: Cathlean Cower Other Clinician: Referring Provider: Treating Provider/Extender: Jeri Modena in Treatment: 0 Information Obtained from: Patient Chief Complaint 10/19/2020; patient is here for review of wounds on his bilateral anterior great toes in the setting of PAD Electronic Signature(s) Signed: 10/19/2020 5:29:55 PM By: Linton Ham MD Entered By: Linton Ham on 10/19/2020 16:34:31 -------------------------------------------------------------------------------- Debridement Details Patient Name: Date of Service: Joshua Curtis 10/19/2020 2:45 PM Medical Record Number: [id number] Patient Account Number: [id number] Date of Birth/Sex: Treating RN: December 22, 1950 (70 y.o. Joshua Curtis Primary Care Provider: Cathlean Cower Other Clinician: Referring Provider: Treating Provider/Extender: Jeri Modena in Treatment: 0 Debridement Performed for Assessment: Wound #1 Right T Great oe Performed By: Clinician Levan Hurst, RN Debridement Type: Chemical/Enzymatic/Mechanical Agent Used: gauze and wound cleanser Severity of Tissue Pre Debridement: Fat layer exposed Level of Consciousness (Pre-procedure): Awake and Alert Pre-procedure Verification/Time Out No Taken: Bleeding: None Response to Treatment: Procedure was tolerated well Level of Consciousness (Post- Awake and Alert procedure): Post Debridement Measurements of Total Wound Length: (cm) 0.9 Width: (cm) 0.4 Depth: (cm) 0.1 Volume: (cm) 0.028 Character of Wound/Ulcer Post Debridement: Improved Severity of Tissue Post Debridement: Fat layer exposed Post Procedure  Diagnosis Same as Pre-procedure Electronic Signature(s) Signed: 10/19/2020 5:29:55 PM By: Linton Ham MD Signed: 10/19/2020 5:51:10 PM By: Deon Pilling Entered By: Deon Pilling on 10/19/2020 16:21:06 -------------------------------------------------------------------------------- Debridement Details Patient Name: Date of Service: Joshua Curtis 10/19/2020 2:45 PM Medical Record Number: [id number] Patient Account Number: [id number] Date of Birth/Sex: Treating RN: 1950/11/15 (70 y.o. Joshua Curtis Primary Care Provider: Cathlean Cower Other Clinician: Referring Provider: Treating Provider/Extender: Jeri Modena in Treatment: 0 Debridement Performed for Assessment: Wound #2 Left T Great oe Performed By: Clinician Levan Hurst, RN Debridement Type: Chemical/Enzymatic/Mechanical Agent Used: gauze and wound cleanser Severity of Tissue Pre Debridement: Bone involvement without necrosis Level of Consciousness (Pre-procedure): Awake and Alert Pre-procedure Verification/Time Out No Taken: Bleeding: None Response to Treatment: Procedure was tolerated well Level of Consciousness (Post- Awake and Alert procedure): Post Debridement Measurements of Total Wound Length: (cm) 0.4 Width: (cm) 0.4 Depth: (cm) 0.2 Volume: (cm) 0.025 Character of Wound/Ulcer Post Debridement: Improved Severity of Tissue Post Debridement: Fat layer exposed Post Procedure Diagnosis Same as Pre-procedure Electronic Signature(s) Signed: 10/19/2020 5:29:55 PM By: Linton Ham MD Signed: 10/19/2020 5:51:10 PM By: Deon Pilling Entered By: Deon Pilling on 10/19/2020 16:21:30 -------------------------------------------------------------------------------- HPI Details Patient Name: Date of Service: Joshua Curtis 10/19/2020 2:45 PM Medical Record Number: [id number] Patient Account Number: [id number] Date of Birth/Sex: Treating RN: [birth year] (70 y.o. Joshua Curtis Primary Care  Provider: Cathlean Cower Other Clinician: Referring Provider: Treating Provider/Extender: Jeri Modena in Treatment: 0 History of Present Illness HPI Description: ADMISSION 10/19/2020 This is a 70 year old nondiabetic man developed wounds on his bilateral great toes in the summer 2021. Eventually referred to Dr. Earleen Newport who first noted these wounds I think in November. Started him on Santyl noted poor arterial exam and referred him to Dr. Trula Slade. On 07/04/2020 the patient had angiography had stents placed in the left SFA and popliteal and a failed angioplasty on the left PTA noted that he had single-vessel runoff via the peroneal. On 07/18/2020 he had an  angiogram on the right with angioplasty of the right tibial peroneal trunk and peroneal artery and a stent in the right popliteal and SFA. Since then he has been followed mostly by Dr. Earleen Newport using Annitta Needs and Prisma more recently Prisma. It was noted that the left first toe healed at some point but reopened earlier this month. The patient also saw Dr. Megan Salon with a history of osteomyelitis in both of these areas. I have not reviewed the x-rays. He received 1 month of Augmentin in December and I think that Augmentin was actually extended. His arterial evaluation prior to intervention showed an ABI on the right of 0.51 on the left 0.55. In December these were repeated with an ABI in the right of 0.89 on the left 0.91 much improved. The patient states that he was having clear claudication symptoms beforehand now he does not have any pain in his legs when he walks although his activity is still somewhat limited. We did not repeat his ABIs in the clinic Past medical history the patient is not a diabetic but he is a smoker stating he is trying to quit and smoked 2 cigarettes today. He has a history of hypertension, prostate CA currently trying to make a decision between surgery and radiation, pulmonary hypertension and a history of  PAD Electronic Signature(s) Signed: 10/19/2020 5:29:55 PM By: Linton Ham MD Entered By: Linton Ham on 10/19/2020 16:39:34 -------------------------------------------------------------------------------- Physical Exam Details Patient Name: Date of Service: Joshua Curtis 10/19/2020 2:45 PM Medical Record Number: [id number] Patient Account Number: [id number] Date of Birth/Sex: Treating RN: [birth year] (70 y.o. Joshua Curtis Primary Care Provider: Cathlean Cower Other Clinician: Referring Provider: Treating Provider/Extender: Jeri Modena in Treatment: 0 Constitutional Sitting or standing Blood Pressure is within target range for patient.. Pulse regular and within target range for patient.Marland Kitchen Respirations regular, non-labored and within target range.. Temperature is normal and within the target range for the patient.Marland Kitchen Appears in no distress. Respiratory work of breathing is normal. Cardiovascular Popliteal pulses were easily palpable.. Faint dorsalis pedis pulse I could not feel the posterior tibial pulses.. Notes Wound exam; The area on the right just medial and distal to the nailbed has a nice healthy surface and it looks as though this is actually progressing towards closure The area on the left is small with healthy granulation however with a skinny it easily probes to bone Electronic Signature(s) Signed: 10/19/2020 5:29:55 PM By: Linton Ham MD Entered By: Linton Ham on 10/19/2020 16:41:50 -------------------------------------------------------------------------------- Physician Orders Details Patient Name: Date of Service: Joshua Curtis 10/19/2020 2:45 PM Medical Record Number: [id number] Patient Account Number: [id number] Date of Birth/Sex: Treating RN: May 22, 1951 (70 y.o. Joshua Curtis Primary Care Provider: Cathlean Cower Other Clinician: Referring Provider: Treating Provider/Extender: Jeri Modena in Treatment:  0 Verbal / Phone Orders: No Diagnosis Coding Follow-up Appointments Return Appointment in 1 week. Bathing/ Shower/ Hygiene May shower with protection but do not get wound dressing(s) wet. - when not changing dressings. May shower and wash wound with soap and water. - with dressing changes only. Edema Control - Lymphedema / SCD / Other Moisturize legs daily. - every night before bed. Off-Loading Open toe surgical shoe to: - patient to wear daily. no pressure from regular shoes. Wound Treatment Wound #1 - T Great oe Wound Laterality: Right Cleanser: Soap and Water Every Other Day/30 Days Discharge Instructions: May shower and wash wound with dial antibacterial soap and water prior to dressing change. Prim Dressing: Endoform 2x2  in Every Other Day/30 Days ary Discharge Instructions: Moisten with [place] Jelly. Secondary Dressing: Woven Gauze Sponges 2x2 in Every Other Day/30 Days Discharge Instructions: Apply over primary dressing as directed. Secured With: Child psychotherapist, Sterile 2x75 (in/in) Every Other Day/30 Days Discharge Instructions: Secure with stretch gauze as directed. Wound #2 - T Great oe Wound Laterality: Left Cleanser: Soap and Water Every Other Day/30 Days Discharge Instructions: May shower and wash wound with dial antibacterial soap and water prior to dressing change. Prim Dressing: Endoform 2x2 in Every Other Day/30 Days ary Discharge Instructions: Moisten with [place] Jelly. Secondary Dressing: Woven Gauze Sponges 2x2 in Every Other Day/30 Days Discharge Instructions: Apply over primary dressing as directed. Secured With: Child psychotherapist, Sterile 2x75 (in/in) Every Other Day/30 Days Discharge Instructions: Secure with stretch gauze as directed. Patient Medications llergies: No Known Drug Allergies A Notifications Medication Indication Start End lidocaine DOSE topical 4 % gel - gel topical applied for debridement only by MD. Electronic  Signature(s) Signed: 10/19/2020 5:29:55 PM By: Linton Ham MD Signed: 10/19/2020 5:51:10 PM By: Deon Pilling Entered By: Deon Pilling on 10/19/2020 16:24:11 Prescription 10/19/2020 -------------------------------------------------------------------------------- Schnurr, Samiel A. Linton Ham MD Patient Name: Provider: 10/08/1950 [id number] Date of Birth: NPI#: Jerilynn Mages [id number] Sex: DEA #: [id number] Phone #: License #: [place] Patient Address: [facility] [address] [place], [place] [address] [place],  [address] [phone] Allergies No Known Drug Allergies Medication Medication: Route: Strength: Form: lidocaine topical 4% gel Class: TOPICAL LOCAL ANESTHETICS Dose: Frequency / Time: Indication: gel topical applied for debridement only by MD. Number of Refills: Number of Units: 0 Generic Substitution: Start Date: End Date: Administered at Facility: Substitution Permitted Yes Time Administered: Time Discontinued: Note to Pharmacy: Hand Signature: Date(s): Electronic Signature(s) Signed: 10/19/2020 5:29:55 PM By: Linton Ham MD Signed: 10/19/2020 5:51:10 PM By: Deon Pilling Entered By: Deon Pilling on 10/19/2020 16:24:12 -------------------------------------------------------------------------------- Problem List Details Patient Name: Date of Service: Joshua Curtis 10/19/2020 2:45 PM Medical Record Number: [id number] Patient Account Number: [id number] Date of Birth/Sex: Treating RN: August 04, 1951 (69 y.o. [name], [name] Primary Care Provider: Cathlean Cower Other Clinician: Referring Provider: Treating Provider/Extender: Jeri Modena in Treatment: 0 Active Problems ICD-10 Encounter Code Description Active Date MDM Diagnosis I70.235 Atherosclerosis of native arteries of right leg with ulceration of other part of 10/19/2020 No Yes foot I70.245  Atherosclerosis of native arteries of left leg with ulceration of other part of 10/19/2020 No Yes foot L97.512 Non-pressure chronic ulcer of other part of right foot with fat layer exposed 10/19/2020 No Yes L97.524 Non-pressure chronic ulcer of other part of left foot with necrosis of bone 10/19/2020 No Yes Inactive Problems Resolved Problems Electronic Signature(s) Signed: 10/19/2020 5:29:55 PM By: Linton Ham MD Entered By: Linton Ham on 10/19/2020 16:26:51 -------------------------------------------------------------------------------- Progress Note Details Patient Name: Date of Service: Joshua Curtis 10/19/2020 2:45 PM Medical Record Number: [id number] Patient Account Number: [id number] Date of Birth/Sex: Treating RN: [birth year] (70 y.o. Joshua Curtis Primary Care Provider: Cathlean Cower Other Clinician: Referring Provider: Treating Provider/Extender: Jeri Modena in Treatment: 0 Subjective Chief Complaint Information obtained from Patient 10/19/2020; patient is here for review of wounds on his bilateral anterior great toes in the setting of PAD History of Present Illness (HPI) ADMISSION 10/19/2020 This is a 70 year old nondiabetic man developed wounds on his bilateral great toes in the summer 2021. Eventually referred to Dr. Earleen Newport who first  noted these wounds I think in November. Started him on Santyl noted poor arterial exam and referred him to Dr. Trula Slade. On 07/04/2020 the patient had angiography had stents placed in the left SFA and popliteal and a failed angioplasty on the left PTA noted that he had single-vessel runoff via the peroneal. On 07/18/2020 he had an angiogram on the right with angioplasty of the right tibial peroneal trunk and peroneal artery and a stent in the right popliteal and SFA. Since then he has been followed mostly by Dr. Earleen Newport using Annitta Needs and Prisma more recently Prisma. It was noted that the left first toe healed at some  point but reopened earlier this month. The patient also saw Dr. Megan Salon with a history of osteomyelitis in both of these areas. I have not reviewed the x-rays. He received 1 month of Augmentin in December and I think that Augmentin was actually extended. His arterial evaluation prior to intervention showed an ABI on the right of 0.51 on the left 0.55. In December these were repeated with an ABI in the right of 0.89 on the left 0.91 much improved. The patient states that he was having clear claudication symptoms beforehand now he does not have any pain in his legs when he walks although his activity is still somewhat limited. We did not repeat his ABIs in the clinic Past medical history the patient is not a diabetic but he is a smoker stating he is trying to quit and smoked 2 cigarettes today. He has a history of hypertension, prostate CA currently trying to make a decision between surgery and radiation, pulmonary hypertension and a history of PAD Patient History Information obtained from Patient. Allergies No Known Drug Allergies Family History Diabetes - Father,Paternal Grandparents, Hypertension - Siblings, Kidney Disease - Siblings, Stroke - Mother, No family history of Cancer, Heart Disease, Hereditary Spherocytosis, Seizures, Thyroid Problems, Tuberculosis. Social History Current every day smoker - 2 cigarettes a day, Marital Status - Married, Alcohol Use - Never, Drug Use - No History, Caffeine Use - Rarely. Medical History Cardiovascular Patient has history of Hypertension, Peripheral Arterial Disease, Peripheral Venous Disease Denies history of Congestive Heart Failure Medical A Surgical History Notes nd Cardiovascular Thoracic aortic aneurysm, pulmonary artery HTN Oncologic prostate cancer Review of Systems (ROS) Constitutional Symptoms (General Health) Denies complaints or symptoms of Fatigue, Fever, Chills, Marked Weight Change. Eyes Denies complaints or symptoms of Dry  Eyes, Vision Changes, Glasses / Contacts. Ear/Nose/Mouth/Throat Denies complaints or symptoms of Chronic sinus problems or rhinitis. Respiratory Denies complaints or symptoms of Chronic or frequent coughs, Shortness of Breath. Gastrointestinal Denies complaints or symptoms of Frequent diarrhea, Nausea, Vomiting. Endocrine Denies complaints or symptoms of Heat/cold intolerance. Genitourinary Denies complaints or symptoms of Frequent urination. Integumentary (Skin) Complains or has symptoms of Wounds - bilateral great toe wounds. Musculoskeletal Denies complaints or symptoms of Muscle Pain, Muscle Weakness. Neurologic Denies complaints or symptoms of Numbness/parasthesias. Psychiatric Denies complaints or symptoms of Claustrophobia, Suicidal. Objective Constitutional Sitting or standing Blood Pressure is within target range for patient.. Pulse regular and within target range for patient.Marland Kitchen Respirations regular, non-labored and within target range.. Temperature is normal and within the target range for the patient.Marland Kitchen Appears in no distress. Vitals Time Taken: 3:25 PM, Height: 70 in, Source: Stated, Weight: 150 lbs, Source: Stated, BMI: 21.5, Temperature: 98.7 F, Pulse: 101 bpm, Respiratory Rate: 16 breaths/min, Blood Pressure: 117/71 mmHg. Respiratory work of breathing is normal. Cardiovascular Popliteal pulses were easily palpable.. Faint dorsalis pedis pulse I could not  feel the posterior tibial pulses.. General Notes: Wound exam; ooThe area on the right just medial and distal to the nailbed has a nice healthy surface and it looks as though this is actually progressing towards closure ooThe area on the left is small with healthy granulation however with a skinny it easily probes to bone Integumentary (Hair, Skin) Wound #1 status is Open. Original cause of wound was Gradually Appeared. The wound is located on the Right T Great. The wound measures 0.9cm length x oe 0.4cm width x  0.1cm depth; 0.283cm^2 area and 0.028cm^3 volume. There is Fat Layer (Subcutaneous Tissue) exposed. There is no tunneling or undermining noted. There is a medium amount of serosanguineous drainage noted. The wound margin is flat and intact. There is large (67-100%) pink granulation within the wound bed. There is a small (1-33%) amount of necrotic tissue within the wound bed including Adherent Slough. Wound #2 status is Open. Original cause of wound was Gradually Appeared. The wound is located on the Left T Great. The wound measures 0.4cm length x oe 0.4cm width x 0.2cm depth; 0.126cm^2 area and 0.025cm^3 volume. There is bone and Fat Layer (Subcutaneous Tissue) exposed. There is no tunneling or undermining noted. There is a medium amount of serosanguineous drainage noted. The wound margin is flat and intact. There is large (67-100%) red granulation within the wound bed. There is a small (1-33%) amount of necrotic tissue within the wound bed including Adherent Slough. Assessment Active Problems ICD-10 Atherosclerosis of native arteries of right leg with ulceration of other part of foot Atherosclerosis of native arteries of left leg with ulceration of other part of foot Non-pressure chronic ulcer of other part of right foot with fat layer exposed Non-pressure chronic ulcer of other part of left foot with necrosis of bone Procedures Wound #1 Pre-procedure diagnosis of Wound #1 is an Arterial Insufficiency Ulcer located on the Right T Great .Severity of Tissue Pre Debridement is: Fat layer oe exposed. There was a Chemical/Enzymatic/Mechanical debridement performed by Levan Hurst, RN.. Other agent used was gauze and wound cleanser. There was no bleeding. The procedure was tolerated well. Post Debridement Measurements: 0.9cm length x 0.4cm width x 0.1cm depth; 0.028cm^3 volume. Character of Wound/Ulcer Post Debridement is improved. Severity of Tissue Post Debridement is: Fat layer exposed. Post  procedure Diagnosis Wound #1: Same as Pre-Procedure Wound #2 Pre-procedure diagnosis of Wound #2 is an Arterial Insufficiency Ulcer located on the Left T Great .Severity of Tissue Pre Debridement is: Bone oe involvement without necrosis. There was a Chemical/Enzymatic/Mechanical debridement performed by Levan Hurst, RN.. Other agent used was gauze and wound cleanser. There was no bleeding. The procedure was tolerated well. Post Debridement Measurements: 0.4cm length x 0.4cm width x 0.2cm depth; 0.025cm^3 volume. Character of Wound/Ulcer Post Debridement is improved. Severity of Tissue Post Debridement is: Fat layer exposed. Post procedure Diagnosis Wound #2: Same as Pre-Procedure Plan Follow-up Appointments: Return Appointment in 1 week. Bathing/ Shower/ Hygiene: May shower with protection but do not get wound dressing(s) wet. - when not changing dressings. May shower and wash wound with soap and water. - with dressing changes only. Edema Control - Lymphedema / SCD / Other: Moisturize legs daily. - every night before bed. Off-Loading: Open toe surgical shoe to: - patient to wear daily. no pressure from regular shoes. The following medication(s) was prescribed: lidocaine topical 4 % gel gel topical applied for debridement only by MD. was prescribed at facility WOUND #1: - T Great Wound Laterality: Right oe Cleanser:  Soap and Water Every Other Day/30 Days Discharge Instructions: May shower and wash wound with dial antibacterial soap and water prior to dressing change. Prim Dressing: Endoform 2x2 in Every Other Day/30 Days ary Discharge Instructions: Moisten with [place] Jelly. Secondary Dressing: Woven Gauze Sponges 2x2 in Every Other Day/30 Days Discharge Instructions: Apply over primary dressing as directed. Secured With: Child psychotherapist, Sterile 2x75 (in/in) Every Other Day/30 Days Discharge Instructions: Secure with stretch gauze as directed. WOUND #2: - T Great Wound  Laterality: Left oe Cleanser: Soap and Water Every Other Day/30 Days Discharge Instructions: May shower and wash wound with dial antibacterial soap and water prior to dressing change. Prim Dressing: Endoform 2x2 in Every Other Day/30 Days ary Discharge Instructions: Moisten with [place] Jelly. Secondary Dressing: Woven Gauze Sponges 2x2 in Every Other Day/30 Days Discharge Instructions: Apply over primary dressing as directed. Secured With: Child psychotherapist, Sterile 2x75 (in/in) Every Other Day/30 Days Discharge Instructions: Secure with stretch gauze as directed. 1. I am going to use endoform in both of these areas these will gently be probed into the wound moistened and changed every second 2. The most worrisome area is the area on the left which probes to bone with a skinny. 3. There is no gross purulence in any of these areas 4. I will review hip his x-rays. He is no longer on antibiotics but had trouble determining the exact duration of antibiotic therapy. 5. He has been revascularized. This is obviously been successful by a noticeable increase in his ABIs in December. He is also no longer obviously symptomatic. Electronic Signature(s) Signed: 10/19/2020 5:29:55 PM By: Linton Ham MD Entered By: Linton Ham on 10/19/2020 16:43:31 -------------------------------------------------------------------------------- HxROS Details Patient Name: Date of Service: Joshua Curtis 10/19/2020 2:45 PM Medical Record Number: [id number] Patient Account Number: [id number] Date of Birth/Sex: Treating RN: [birth year] (70 y.o. [name] Primary Care Provider: Cathlean Cower Other Clinician: Referring Provider: Treating Provider/Extender: Jeri Modena in Treatment: 0 Information Obtained From Patient Constitutional Symptoms (General Health) Complaints and Symptoms: Negative for: Fatigue; Fever; Chills; Marked Weight Change Eyes Complaints and  Symptoms: Negative for: Dry Eyes; Vision Changes; Glasses / Contacts Ear/Nose/Mouth/Throat Complaints and Symptoms: Negative for: Chronic sinus problems or rhinitis Respiratory Complaints and Symptoms: Negative for: Chronic or frequent coughs; Shortness of Breath Gastrointestinal Complaints and Symptoms: Negative for: Frequent diarrhea; Nausea; Vomiting Endocrine Complaints and Symptoms: Negative for: Heat/cold intolerance Genitourinary Complaints and Symptoms: Negative for: Frequent urination Integumentary (Skin) Complaints and Symptoms: Positive for: Wounds - bilateral great toe wounds Musculoskeletal Complaints and Symptoms: Negative for: Muscle Pain; Muscle Weakness Neurologic Complaints and Symptoms: Negative for: Numbness/parasthesias Psychiatric Complaints and Symptoms: Negative for: Claustrophobia; Suicidal Hematologic/Lymphatic Cardiovascular Medical History: Positive for: Hypertension; Peripheral Arterial Disease; Peripheral Venous Disease Negative for: Congestive Heart Failure Past Medical History Notes: Thoracic aortic aneurysm, pulmonary artery HTN Immunological Oncologic Medical History: Past Medical History Notes: prostate cancer Immunizations Pneumococcal Vaccine: Received Pneumococcal Vaccination: Yes Implantable Devices None Family and Social History Cancer: No; Diabetes: Yes - Father,Paternal Grandparents; Heart Disease: No; Hereditary Spherocytosis: No; Hypertension: Yes - Siblings; Kidney Disease: Yes - Siblings; Seizures: No; Stroke: Yes - Mother; Thyroid Problems: No; Tuberculosis: No; Current every day smoker - 2 cigarettes a day; Marital Status - Married; Alcohol Use: Never; Drug Use: No History; Caffeine Use: Rarely; Financial Concerns: No; Food, Clothing or Shelter Needs: No; Support System Lacking: No; Transportation Concerns: No Engineer, maintenance) Signed: 10/19/2020 5:29:55 PM By: Linton Ham MD Signed: 10/20/2020  4:25:56 PM By:  Levan Hurst RN, BSN Entered By: Levan Hurst on 10/19/2020 15:49:03 -------------------------------------------------------------------------------- [place] Details Patient Name: Date of Service: Joshua Curtis 10/19/2020 Medical Record Number: [id number] Patient Account Number: [id number] Date of Birth/Sex: Treating RN: Mar 31, 1951 (70 y.o. 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 Facility Procedures CPT4 Code: [id number] Description: 99214 - WOUND CARE VISIT-LEV 4 EST PT Modifier: Quantity: 1 Physician Procedures : CPT4 Code Description Modifier [id number] WC PHYS LEVEL 3 NEW PT ICD-10 Diagnosis Description I70.235 Atherosclerosis of native arteries of right leg with ulceration of other part of foot I70.245 Atherosclerosis of native arteries of left leg with  ulceration of other part of foot 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

## 2020-10-20 NOTE — Progress Notes (Signed)
Joshua Curtis, Joshua Curtis ([id number]) Visit Report for 10/19/2020 Abuse/Suicide Risk Screen Details Patient Name: Date of Service: Joshua Curtis, Joshua Curtis 10/19/2020 2:45 PM Medical Record Number: [id number] Patient Account Number: [id number] Date of Birth/Sex: Treating RN: 1951-05-22 (70 y.o. [name] Primary Care Lettie Czarnecki: Cathlean Cower Other Clinician: Referring Mary-Ann Pennella: Treating Brysen Shankman/Extender: Jeri Modena in Treatment: 0 Abuse/Suicide Risk Screen Items Answer ABUSE RISK SCREEN: Has anyone close to you tried to hurt or harm you recentlyo No Do you feel uncomfortable with anyone in your familyo No Has anyone forced you do things that you didnt want to doo No Electronic Signature(s) Signed: 10/20/2020 4:25:56 PM By: Levan Hurst RN, BSN Entered By: Levan Hurst on 10/19/2020 15:49:11 -------------------------------------------------------------------------------- Activities of Daily Living Details Patient Name: Date of Service: Joshua Curtis, Joshua Curtis 10/19/2020 2:45 PM Medical Record Number: [id number] Patient Account Number: [id number] Date of Birth/Sex: Treating RN: 09/27/1950 (70 y.o. [name] Primary Care Gennesis Hogland: Cathlean Cower Other Clinician: Referring Jabaree Mercado: Treating Shareta Fishbaugh/Extender: Jeri Modena in Treatment: 0 Activities of Daily Living Items Answer Activities of Daily Living (Please select one for each item) Drive Automobile Completely Able T Medications ake Completely Able Use T elephone Completely Able Care for Appearance Completely Able Use T oilet Completely Able Bath / Shower Completely Able Dress Self Completely Able Feed Self Completely Able Walk Completely Able Get In / Out Bed Completely Able Housework Completely Able Prepare Meals Completely Able Handle Money Completely Able Shop for Self Completely Able Electronic Signature(s) Signed: 10/20/2020 4:25:56 PM By: Levan Hurst RN, BSN Entered By:  Levan Hurst on 10/19/2020 15:49:28 -------------------------------------------------------------------------------- Education Screening Details Patient Name: Date of Service: Joshua Curtis 10/19/2020 2:45 PM Medical Record Number: [id number] Patient Account Number: [id number] Date of Birth/Sex: Treating RN: 02-02-1951 (70 y.o. [name] Primary Care Yoali Conry: Cathlean Cower Other Clinician: Referring Srija Southard: Treating Arriana Lohmann/Extender: Jeri Modena in Treatment: 0 Primary Learner Assessed: Patient Learning Preferences/Education Level/Primary Language Learning Preference: Explanation, Demonstration, Printed Material Highest Education Level: High School Preferred Language: English Cognitive Barrier Language Barrier: No Translator Needed: No Memory Deficit: No Emotional Barrier: No Cultural/Religious Beliefs Affecting Medical Care: No Physical Barrier Impaired Vision: No Impaired Hearing: No Decreased Hand dexterity: No Knowledge/Comprehension Knowledge Level: High Comprehension Level: High Ability to understand written instructions: High Ability to understand verbal instructions: High Motivation Anxiety Level: Calm Cooperation: Cooperative Education Importance: Acknowledges Need Interest in Health Problems: Asks Questions Perception: Coherent Willingness to Engage in Self-Management High Activities: Readiness to Engage in Self-Management High Activities: Electronic Signature(s) Signed: 10/20/2020 4:25:56 PM By: Levan Hurst RN, BSN Entered By: Levan Hurst on 10/19/2020 15:49:47 -------------------------------------------------------------------------------- Fall Risk Assessment Details Patient Name: Date of Service: Joshua Curtis 10/19/2020 2:45 PM Medical Record Number: [id number] Patient Account Number: [id number] Date of Birth/Sex: Treating RN: 1951/01/15 (69 y.o. [name] Primary Care Myreon Wimer: Cathlean Cower Other Clinician: Referring Jayvien Rowlette: Treating Roann Merk/Extender: Jeri Modena in Treatment: 0 Fall Risk Assessment Items Have you had 2 or more falls in the last 12 monthso 0 No Have you had any fall that resulted in injury in the last 12 monthso 0 No FALLS RISK SCREEN History of falling - immediate or within 3 months 0 No Secondary diagnosis (Do you have 2 or more medical diagnoseso) 15 Yes Ambulatory aid None/bed rest/wheelchair/nurse 0 Yes Crutches/cane/walker 0 No Furniture 0 No Intravenous therapy Access/Saline/Heparin Lock 0 No Gait/Transferring Normal/ bed rest/ wheelchair 0 Yes Weak (short steps with or without shuffle, stooped  but able to lift head while walking, may seek 0 No support from furniture) Impaired (short steps with shuffle, may have difficulty arising from chair, head down, impaired 0 No balance) Mental Status Oriented to own ability 0 Yes Electronic Signature(s) Signed: 10/20/2020 4:25:56 PM By: Levan Hurst RN, BSN Entered By: Levan Hurst on 10/19/2020 15:49:57 -------------------------------------------------------------------------------- Foot Assessment Details Patient Name: Date of Service: Joshua Curtis 10/19/2020 2:45 PM Medical Record Number: [id number] Patient Account Number: [id number] Date of Birth/Sex: Treating RN: [birth year] (70 y.o. [name] Primary Care Calliope Delangel: Cathlean Cower Other Clinician: Referring Arrian Manson: Treating Raymonda Pell/Extender: Jeri Modena in Treatment: 0 Foot Assessment Items Site Locations + = Sensation present, - = Sensation absent, C = Callus, U = Ulcer R = Redness, W = Warmth, M = Maceration, PU = Pre-ulcerative lesion F = Fissure, S = Swelling, D = Dryness Assessment Right: Left: Other Deformity: No No Prior Foot Ulcer: No No Prior Amputation: No No Charcot Joint: No No Ambulatory Status: Ambulatory Without Help Gait: Steady Electronic  Signature(s) Signed: 10/20/2020 4:25:56 PM By: Levan Hurst RN, BSN Entered By: Levan Hurst on 10/19/2020 15:50:26 -------------------------------------------------------------------------------- Nutrition Risk Screening Details Patient Name: Date of Service: Joshua Curtis 10/19/2020 2:45 PM Medical Record Number: [id number] Patient Account Number: [id number] Date of Birth/Sex: Treating RN: 1951-05-04 (70 y.o. [name] Primary Care Surya Folden: Cathlean Cower Other Clinician: Referring Handsome Anglin: Treating Tangie Stay/Extender: Jeri Modena in Treatment: 0 Height (in): 70 Weight (lbs): 150 Body Mass Index (BMI): 21.5 Nutrition Risk Screening Items Score Screening NUTRITION RISK SCREEN: I have an illness or condition that made me change the kind and/or amount of food I eat 0 No I eat fewer than two meals per day 0 No I eat few fruits and vegetables, or milk products 0 No I have three or more drinks of beer, liquor or wine almost every day 0 No I have tooth or mouth problems that make it hard for me to eat 0 No I don't always have enough money to buy the food I need 0 No I eat alone most of the time 0 No I take three or more different prescribed or over-the-counter drugs a day 1 Yes Without wanting to, I have lost or gained 10 pounds in the last six months 0 No I am not always physically able to shop, cook and/or feed myself 0 No Nutrition Protocols Good Risk Protocol Moderate Risk Protocol 0 Provide education on nutrition High Risk Proctocol Risk Level: Good Risk Score: 1 Electronic Signature(s) Signed: 10/20/2020 4:25:56 PM By: Levan Hurst RN, BSN Entered By: Levan Hurst on 10/19/2020 15:50:04

## 2020-10-24 ENCOUNTER — Other Ambulatory Visit: Payer: Self-pay

## 2020-10-24 ENCOUNTER — Ambulatory Visit: Payer: BC Managed Care – PPO

## 2020-10-24 ENCOUNTER — Ambulatory Visit (INDEPENDENT_AMBULATORY_CARE_PROVIDER_SITE_OTHER): Payer: BC Managed Care – PPO | Admitting: Podiatry

## 2020-10-24 ENCOUNTER — Institutional Professional Consult (permissible substitution): Payer: BC Managed Care – PPO | Admitting: Radiation Oncology

## 2020-10-24 DIAGNOSIS — I739 Peripheral vascular disease, unspecified: Secondary | ICD-10-CM

## 2020-10-24 DIAGNOSIS — L97512 Non-pressure chronic ulcer of other part of right foot with fat layer exposed: Secondary | ICD-10-CM | POA: Diagnosis not present

## 2020-10-24 DIAGNOSIS — L97522 Non-pressure chronic ulcer of other part of left foot with fat layer exposed: Secondary | ICD-10-CM | POA: Diagnosis not present

## 2020-10-26 ENCOUNTER — Encounter (HOSPITAL_BASED_OUTPATIENT_CLINIC_OR_DEPARTMENT_OTHER): Payer: BC Managed Care – PPO | Admitting: Internal Medicine

## 2020-10-26 ENCOUNTER — Other Ambulatory Visit: Payer: Self-pay

## 2020-10-26 DIAGNOSIS — L97524 Non-pressure chronic ulcer of other part of left foot with necrosis of bone: Secondary | ICD-10-CM | POA: Diagnosis not present

## 2020-10-26 DIAGNOSIS — F17210 Nicotine dependence, cigarettes, uncomplicated: Secondary | ICD-10-CM | POA: Diagnosis not present

## 2020-10-26 DIAGNOSIS — I70245 Atherosclerosis of native arteries of left leg with ulceration of other part of foot: Secondary | ICD-10-CM | POA: Diagnosis not present

## 2020-10-26 DIAGNOSIS — L97512 Non-pressure chronic ulcer of other part of right foot with fat layer exposed: Secondary | ICD-10-CM | POA: Diagnosis not present

## 2020-10-26 DIAGNOSIS — I70235 Atherosclerosis of native arteries of right leg with ulceration of other part of foot: Secondary | ICD-10-CM | POA: Diagnosis not present

## 2020-10-26 NOTE — Progress Notes (Signed)
RYEN, RHAMES ([id number]) Visit Report for 10/26/2020 HPI Details Patient Name: Date of Service: Joshua Curtis, Joshua Curtis 10/26/2020 12:30 PM Medical Record Number: [id number] Patient Account Number: [id number] Date of Birth/Sex: Treating RN: 01/03/1951 (70 y.o. Joshua Curtis Primary Care Provider: Cathlean Cower Other Clinician: Referring Provider: Treating Provider/Extender: Jeri Modena in Treatment: 1 History of Present Illness HPI Description: ADMISSION 10/19/2020 This is a 70 year old nondiabetic man developed wounds on his bilateral great toes in the summer 2021. Eventually referred to Dr. Earleen Newport who first noted these wounds I think in November. Started him on Santyl noted poor arterial exam and referred him to Dr. Trula Slade. On 07/04/2020 the patient had angiography had stents placed in the left SFA and popliteal and a failed angioplasty on the left PTA noted that he had single-vessel runoff via the peroneal. On 07/18/2020 he had an angiogram on the right with angioplasty of the right tibial peroneal trunk and peroneal artery and a stent in the right popliteal and SFA. Since then he has been followed mostly by Dr. Earleen Newport using Annitta Needs and Prisma more recently Prisma. It was noted that the left first toe healed at some point but reopened earlier this month. The patient also saw Dr. Megan Salon with a history of osteomyelitis in both of these areas. I have not reviewed the x-rays. He received 1 month of Augmentin in December and I think that Augmentin was actually extended. His arterial evaluation prior to intervention showed an ABI on the right of 0.51 on the left 0.55. In December these were repeated with an ABI in the right of 0.89 on the left 0.91 much improved. The patient states that he was having clear claudication symptoms beforehand now he does not have any pain in his legs when he walks although his activity is still somewhat limited. We did not repeat his ABIs in  the clinic Past medical history the patient is not a diabetic but he is a smoker stating he is trying to quit and smoked 2 cigarettes today. He has a history of hypertension, prostate CA currently trying to make a decision between surgery and radiation, pulmonary hypertension and a history of PAD 2/24; patient is using endoform. His wounds look better today. I did review his x-rays subtle changes of osteomyelitis. He was treated by Dr. Megan Salon with least a month of Augmentin possibly more. Electronic Signature(s) Signed: 10/26/2020 5:09:37 PM By: Linton Ham MD Entered By: Linton Ham on 10/26/2020 13:19:31 -------------------------------------------------------------------------------- Physical Exam Details Patient Name: Date of Service: Joshua Curtis 10/26/2020 12:30 PM Medical Record Number: [id number] Patient Account Number: [id number] Date of Birth/Sex: Treating RN: 1951-06-18 (70 y.o. Joshua Curtis Primary Care Provider: Cathlean Cower Other Clinician: Referring Provider: Treating Provider/Extender: Jeri Modena in Treatment: 1 Constitutional Sitting or standing Blood Pressure is within target range for patient.. Pulse regular and within target range for patient.Marland Kitchen Respirations regular, non-labored and within target range.. Temperature is normal and within the target range for the patient.Marland Kitchen Appears in no distress. Notes Wound exams The area on the right just medial distal to the nailbed has a healthy looking surface. No palpable bone. The area on the left healthy looking granulation but again this did not probe to bone today which was an improvement. No cultures were done Electronic Signature(s) Signed: 10/26/2020 5:09:37 PM By: Linton Ham MD Entered By: Linton Ham on 10/26/2020 13:20:15 -------------------------------------------------------------------------------- Physician Orders Details Patient Name: Date of Service: Joshua Curtis 10/26/2020 12:30 PM Medical Record  Number: [id number] Patient Account Number: [id number] Date of Birth/Sex: Treating RN: 21-Oct-1950 (70 y.o. [name], [name] Primary Care Provider: Cathlean Cower Other Clinician: Referring Provider: Treating Provider/Extender: Jeri Modena in Treatment: 1 Verbal / Phone Orders: No Diagnosis Coding ICD-10 Coding Code Description [phone] Atherosclerosis of native arteries of right leg with ulceration of other part of foot I70.245 Atherosclerosis of native arteries of left leg with ulceration of other part of foot L97.512 Non-pressure chronic ulcer of other part of right foot with fat layer exposed L97.524 Non-pressure chronic ulcer of other part of left foot with necrosis of bone Follow-up Appointments ppointment in 2 weeks. - 11/09/2020 Return A Bathing/ Shower/ Hygiene May shower with protection but do not get wound dressing(s) wet. - when not changing dressings. May shower and wash wound with soap and water. - with dressing changes only. Edema Control - Lymphedema / SCD / Other Moisturize legs daily. - every night before bed. Off-Loading Open toe surgical shoe to: - patient to wear daily. no pressure from regular shoes. Wound Treatment Wound #1 - T Great oe Wound Laterality: Right Cleanser: Soap and Water Every Other Day/30 Days Discharge Instructions: May shower and wash wound with dial antibacterial soap and water prior to dressing change. Prim Dressing: Endoform 2x2 in Every Other Day/30 Days ary Discharge Instructions: Moisten with [place] Jelly. Secondary Dressing: Woven Gauze Sponges 2x2 in Every Other Day/30 Days Discharge Instructions: Apply over primary dressing as directed. Secured With: Child psychotherapist, Sterile 2x75 (in/in) Every Other Day/30 Days Discharge Instructions: Secure with stretch gauze as directed. Wound #2 - T Great oe Wound Laterality: Left Cleanser: Soap and Water Every Other Day/30  Days Discharge Instructions: May shower and wash wound with dial antibacterial soap and water prior to dressing change. Prim Dressing: Endoform 2x2 in Every Other Day/30 Days ary Discharge Instructions: Moisten with [place] Jelly. Secondary Dressing: Woven Gauze Sponges 2x2 in Every Other Day/30 Days Discharge Instructions: Apply over primary dressing as directed. Secured With: Child psychotherapist, Sterile 2x75 (in/in) Every Other Day/30 Days Discharge Instructions: Secure with stretch gauze as directed. Electronic Signature(s) Signed: 10/26/2020 5:09:37 PM By: Linton Ham MD Signed: 10/26/2020 5:39:27 PM By: Deon Pilling Entered By: Deon Pilling on 10/26/2020 13:16:53 -------------------------------------------------------------------------------- Problem List Details Patient Name: Date of Service: Joshua Curtis 10/26/2020 12:30 PM Medical Record Number: [id number] Patient Account Number: [id number] Date of Birth/Sex: Treating RN: 04-06-1951 (70 y.o. [name], [name] Primary Care Provider: Cathlean Cower Other Clinician: Referring Provider: Treating Provider/Extender: Jeri Modena in Treatment: 1 Active Problems ICD-10 Encounter Code Description Active Date MDM Diagnosis I70.235 Atherosclerosis of native arteries of right leg with ulceration of other part of 10/19/2020 No Yes foot I70.245 Atherosclerosis of native arteries of left leg with ulceration of other part of 10/19/2020 No Yes foot L97.512 Non-pressure chronic ulcer of other part of right foot with fat layer exposed 10/19/2020 No Yes L97.524 Non-pressure chronic ulcer of other part of left foot with necrosis of bone 10/19/2020 No Yes Inactive Problems Resolved Problems Electronic Signature(s) Signed: 10/26/2020 5:09:37 PM By: Linton Ham MD Entered By: Linton Ham on 10/26/2020 13:18:27 -------------------------------------------------------------------------------- Progress  Note Details Patient Name: Date of Service: Joshua Curtis 10/26/2020 12:30 PM Medical Record Number: [id number] Patient Account Number: [id number] Date of Birth/Sex: Treating RN: 1950-10-19 (70 y.o. Joshua Curtis Primary Care Provider: Cathlean Cower Other Clinician: Referring Provider: Treating Provider/Extender: Jeri Modena in Treatment: 1 Subjective History of Present Illness (  HPI) ADMISSION 10/19/2020 This is a 70 year old nondiabetic man developed wounds on his bilateral great toes in the summer 2021. Eventually referred to Dr. Earleen Newport who first noted these wounds I think in November. Started him on Santyl noted poor arterial exam and referred him to Dr. Trula Slade. On 07/04/2020 the patient had angiography had stents placed in the left SFA and popliteal and a failed angioplasty on the left PTA noted that he had single-vessel runoff via the peroneal. On 07/18/2020 he had an angiogram on the right with angioplasty of the right tibial peroneal trunk and peroneal artery and a stent in the right popliteal and SFA. Since then he has been followed mostly by Dr. Earleen Newport using Annitta Needs and Prisma more recently Prisma. It was noted that the left first toe healed at some point but reopened earlier this month. The patient also saw Dr. Megan Salon with a history of osteomyelitis in both of these areas. I have not reviewed the x-rays. He received 1 month of Augmentin in December and I think that Augmentin was actually extended. His arterial evaluation prior to intervention showed an ABI on the right of 0.51 on the left 0.55. In December these were repeated with an ABI in the right of 0.89 on the left 0.91 much improved. The patient states that he was having clear claudication symptoms beforehand now he does not have any pain in his legs when he walks although his activity is still somewhat limited. We did not repeat his ABIs in the clinic Past medical history the patient is not a  diabetic but he is a smoker stating he is trying to quit and smoked 2 cigarettes today. He has a history of hypertension, prostate CA currently trying to make a decision between surgery and radiation, pulmonary hypertension and a history of PAD 2/24; patient is using endoform. His wounds look better today. I did review his x-rays subtle changes of osteomyelitis. He was treated by Dr. Megan Salon with least a month of Augmentin possibly more. Objective Constitutional Sitting or standing Blood Pressure is within target range for patient.. Pulse regular and within target range for patient.Marland Kitchen Respirations regular, non-labored and within target range.. Temperature is normal and within the target range for the patient.Marland Kitchen Appears in no distress. Vitals Time Taken: 12:50 PM, Height: 70 in, Weight: 150 lbs, BMI: 21.5, Temperature: 97.8 F, Pulse: 77 bpm, Respiratory Rate: 16 breaths/min, Blood Pressure: 116/68 mmHg. General Notes: Wound exams ooThe area on the right just medial distal to the nailbed has a healthy looking surface. No palpable bone. ooThe area on the left healthy looking granulation but again this did not probe to bone today which was an improvement. ooNo cultures were done Integumentary (Hair, Skin) Wound #1 status is Open. Original cause of wound was Gradually Appeared. The date acquired was: 03/02/2020. The wound has been in treatment 1 weeks. The wound is located on the Right T Great. The wound measures 0.7cm length x 0.4cm width x 0.2cm depth; 0.22cm^2 area and 0.044cm^3 volume. There is Fat oe Layer (Subcutaneous Tissue) exposed. There is no tunneling or undermining noted. There is a medium amount of serosanguineous drainage noted. The wound margin is flat and intact. There is large (67-100%) pink granulation within the wound bed. There is no necrotic tissue within the wound bed. Wound #2 status is Open. Original cause of wound was Gradually Appeared. The date acquired was: 03/02/2020. The  wound has been in treatment 1 weeks. The wound is located on the Left T Great. The wound  measures 0.4cm length x 0.4cm width x 0.3cm depth; 0.126cm^2 area and 0.038cm^3 volume. There is bone oe and Fat Layer (Subcutaneous Tissue) exposed. There is no tunneling or undermining noted. There is a medium amount of serosanguineous drainage noted. The wound margin is flat and intact. There is large (67-100%) pink granulation within the wound bed. There is a small (1-33%) amount of necrotic tissue within the wound bed including Adherent Slough. Assessment Active Problems ICD-10 Atherosclerosis of native arteries of right leg with ulceration of other part of foot Atherosclerosis of native arteries of left leg with ulceration of other part of foot Non-pressure chronic ulcer of other part of right foot with fat layer exposed Non-pressure chronic ulcer of other part of left foot with necrosis of bone Plan Follow-up Appointments: Return Appointment in 2 weeks. - 11/09/2020 Bathing/ Shower/ Hygiene: May shower with protection but do not get wound dressing(s) wet. - when not changing dressings. May shower and wash wound with soap and water. - with dressing changes only. Edema Control - Lymphedema / SCD / Other: Moisturize legs daily. - every night before bed. Off-Loading: Open toe surgical shoe to: - patient to wear daily. no pressure from regular shoes. WOUND #1: - T Great Wound Laterality: Right oe Cleanser: Soap and Water Every Other Day/30 Days Discharge Instructions: May shower and wash wound with dial antibacterial soap and water prior to dressing change. Prim Dressing: Endoform 2x2 in Every Other Day/30 Days ary Discharge Instructions: Moisten with [place] Jelly. Secondary Dressing: Woven Gauze Sponges 2x2 in Every Other Day/30 Days Discharge Instructions: Apply over primary dressing as directed. Secured With: Child psychotherapist, Sterile 2x75 (in/in) Every Other Day/30 Days Discharge  Instructions: Secure with stretch gauze as directed. WOUND #2: - T Great Wound Laterality: Left oe Cleanser: Soap and Water Every Other Day/30 Days Discharge Instructions: May shower and wash wound with dial antibacterial soap and water prior to dressing change. Prim Dressing: Endoform 2x2 in Every Other Day/30 Days ary Discharge Instructions: Moisten with [place] Jelly. Secondary Dressing: Woven Gauze Sponges 2x2 in Every Other Day/30 Days Discharge Instructions: Apply over primary dressing as directed. Secured With: Child psychotherapist, Sterile 2x75 (in/in) Every Other Day/30 Days Discharge Instructions: Secure with stretch gauze as directed. .1. Small wounds on the tips of both great toes. The more worrisome areas on the left 2. I continued with endoform we seem to have been able to granulate the area on the left to cover any exposed bone. 3. He has completed his antibiotics I think sometime in January although I do not have a good sense of just how long it he was on these. I will have to review Dr. Hale Bogus notes 4. At this point no additional cultures or antibiotics but careful offload Electronic Signature(s) Signed: 10/26/2020 5:09:37 PM By: Linton Ham MD Entered By: Linton Ham on 10/26/2020 13:21:09 -------------------------------------------------------------------------------- SuperBill Details Patient Name: Date of Service: Joshua Curtis 10/26/2020 Medical Record Number: [id number] Patient Account Number: [id number] Date of Birth/Sex: Treating RN: 1950/09/25 (70 y.o. [name], [name] Primary Care Provider: Cathlean Cower Other Clinician: Referring Provider: Treating Provider/Extender: Jeri Modena in Treatment: 1 Diagnosis Coding ICD-10 Codes Code Description [phone] Atherosclerosis of native arteries of right leg with ulceration of other part of foot I70.245 Atherosclerosis of native arteries of left leg with ulceration of other  part of foot L97.512 Non-pressure chronic ulcer of other part of right foot with fat layer exposed L97.524 Non-pressure chronic ulcer of other part  of left foot with necrosis of bone Facility Procedures CPT4 Code: [id number] Description: 99214 - WOUND CARE VISIT-LEV 4 EST PT Modifier: Quantity: 1 Physician Procedures : CPT4 Code Description Modifier [id number] - WC PHYS LEVEL 3 - EST PT ICD-10 Diagnosis Description L97.512 Non-pressure chronic ulcer of other part of right foot with fat layer exposed L97.524 Non-pressure chronic ulcer of other part of left foot  with necrosis of bone Quantity: 1 Electronic Signature(s) Signed: 10/26/2020 5:09:37 PM By: Linton Ham MD Entered By: Linton Ham on 10/26/2020 13:21:36

## 2020-10-29 NOTE — Progress Notes (Signed)
Subjective: 70 year old male presents the office today for wound check of bilateral big toes.  Center.  Continue with Prisma every other day dressing changes.  Denies any drainage or pus any swelling or redness.  Has no new concerns today.  Denies any fevers, chills, nausea, vomiting.  No calf pain, chest pain, shortness of breath.    Angio 07/04/2020 Impression:               #1.  Tandem 95% heavily calcified lesions within the left superficial femoral-popliteal artery successfully stented.  A 5 mm Tigris stent was placed across the joint space followed by 6 x 120 [place]                #2  Single-vessel runoff on the left via the peroneal artery               #3  Failed attempt at recanalization of an occluded left posterior tibial artery               #4  Occlusion of the right popliteal artery behind the knee.  The patient will be brought back for an attempt at recanalization  Angio 07/18/2020 Impression:               #1 occlusion of the right popliteal artery and high-grade stenosis of the right superficial femoral artery in the adductor canal with single-vessel runoff.  The origin of the peroneal artery had a greater than 50% stenosis.               #2 successful balloon angioplasty of the posterior tibial tibioperoneal trunk using a 3 mm balloon.  Stenting of the popliteal occlusion and superficial femoral artery stenosis using a 5 x 100 Tigris across the knee followed by overlapping 6 x 120 Elluvia   Objective: AAO x3, NAD LEFT foot: Wound is present with clean wound base.  There is no exposed bone or probing to bone.  No undermining or tunneling.  No edema, erythema. RIGHT foot: Ulceration still present along the distal dorsal portion of the hallux.  There is improvement in the wound today.  There is no surrounding erythema, ascending cellulitis.  No fluctuation, crepitation, malodor. No pain with calf compression, swelling, warmth, erythema  Assessment: 70 year old male with  ulcerations bilaterally with PAD  Plan: -All treatment options discussed with the patient including all alternatives, risks, complications.  -Continue with dressing changes as directed by the wound care center.  Continue surgical shoes and offloading.  Will defer to the wound care center at this point.  Trula Slade DPM

## 2020-10-30 NOTE — Progress Notes (Signed)
BAYLOR, CORTEZ ([id number]) Visit Report for 10/26/2020 Arrival Information Details Patient Name: Date of Service: Joshua Curtis, Joshua Curtis 10/26/2020 12:30 PM Medical Record Number: [id number] Patient Account Number: [id number] Date of Birth/Sex: Treating RN: [birth year] (70 y.o. [name] Primary Care Nevae Pinnix: Cathlean Cower Other Clinician: Referring Norah Fick: Treating Noami Bove/Extender: Jeri Modena in Treatment: 1 Visit Information History Since Last Visit Added or deleted any medications: No Patient Arrived: Ambulatory Any new allergies or adverse reactions: No Arrival Time: 12:50 Had a fall or experienced change in No Accompanied By: alone activities of daily living that may affect Transfer Assistance: None risk of falls: Patient Identification Verified: Yes Signs or symptoms of abuse/neglect since No Secondary Verification Process Completed: Yes last visito Patient Requires Transmission-Based Precautions: No Hospitalized since last visit: No Patient Has Alerts: Yes Implantable device outside of the clinic No Patient Alerts: Patient on Blood Thinner excluding L ABI: 0.91 cellular tissue based products placed in the R ABI: 0.89 (08/2020) center since last visit: Has Dressing in Place as Prescribed: Yes Has Footwear/Offloading in Place as Yes Prescribed: Left: Surgical Shoe with Pressure Relief Insole Right: Surgical Shoe with Pressure Relief Insole Pain Present Now: No Electronic Signature(s) Signed: 10/30/2020 5:56:44 PM By: Levan Hurst RN, BSN Entered By: Levan Hurst on 10/26/2020 13:01:24 -------------------------------------------------------------------------------- Clinic Level of Care Assessment Details Patient Name: Date of Service: Joshua Curtis, Joshua Curtis 10/26/2020 12:30 PM Medical Record Number: [id number] Patient Account Number: [id number] Date of Birth/Sex: Treating RN: Jun 19, 1951 (70 y.o. [name], [name] Primary Care Tamberlyn Midgley:  Cathlean Cower Other Clinician: Referring Kaion Tisdale: Treating Kenyan Karnes/Extender: Jeri Modena in Treatment: 1 Clinic Level of Care Assessment Items TOOL 4 Quantity Score X- 1 0 Use when only an EandM is performed on FOLLOW-UP visit ASSESSMENTS - Nursing Assessment / Reassessment X- 1 10 Reassessment of Co-morbidities (includes updates in patient status) X- 1 5 Reassessment of Adherence to Treatment Plan ASSESSMENTS - Wound and Skin A ssessment / Reassessment []  - 0 Simple Wound Assessment / Reassessment - one wound X- 2 5 Complex Wound Assessment / Reassessment - multiple wounds X- 1 10 Dermatologic / Skin Assessment (not related to wound area) ASSESSMENTS - Focused Assessment X- 2 5 Circumferential Edema Measurements - multi extremities X- 1 10 Nutritional Assessment / Counseling / Intervention []  - 0 Lower Extremity Assessment (monofilament, tuning fork, pulses) []  - 0 Peripheral Arterial Disease Assessment (using hand held doppler) ASSESSMENTS - Ostomy and/or Continence Assessment and Care []  - 0 Incontinence Assessment and Management []  - 0 Ostomy Care Assessment and Management (repouching, etc.) PROCESS - Coordination of Care []  - 0 Simple Patient / Family Education for ongoing care X- 1 20 Complex (extensive) Patient / Family Education for ongoing care X- 1 10 Staff obtains Programmer, systems, Records, T Results / Process Orders est []  - 0 Staff telephones HHA, Nursing Homes / Clarify orders / etc []  - 0 Routine Transfer to another Facility (non-emergent condition) []  - 0 Routine Hospital Admission (non-emergent condition) []  - 0 New Admissions / Biomedical engineer / Ordering NPWT Apligraf, etc. , []  - 0 Emergency Hospital Admission (emergent condition) []  - 0 Simple Discharge Coordination X- 1 15 Complex (extensive) Discharge Coordination PROCESS - Special Needs []  - 0 Pediatric / Minor Patient Management []  - 0 Isolation Patient  Management []  - 0 Hearing / Language / Visual special needs []  - 0 Assessment of Community assistance (transportation, D/C planning, etc.) []  - 0 Additional assistance / Altered mentation []  - 0 Support Surface(s)  Assessment (bed, cushion, seat, etc.) INTERVENTIONS - Wound Cleansing / Measurement []  - 0 Simple Wound Cleansing - one wound X- 2 5 Complex Wound Cleansing - multiple wounds X- 1 5 Wound Imaging (photographs - any number of wounds) []  - 0 Wound Tracing (instead of photographs) []  - 0 Simple Wound Measurement - one wound X- 2 5 Complex Wound Measurement - multiple wounds INTERVENTIONS - Wound Dressings X - Small Wound Dressing one or multiple wounds 2 10 []  - 0 Medium Wound Dressing one or multiple wounds []  - 0 Large Wound Dressing one or multiple wounds []  - 0 Application of Medications - topical []  - 0 Application of Medications - injection INTERVENTIONS - Miscellaneous []  - 0 External ear exam []  - 0 Specimen Collection (cultures, biopsies, blood, body fluids, etc.) []  - 0 Specimen(s) / Culture(s) sent or taken to Lab for analysis []  - 0 Patient Transfer (multiple staff / Civil Service fast streamer / Similar devices) []  - 0 Simple Staple / Suture removal (25 or less) []  - 0 Complex Staple / Suture removal (26 or more) []  - 0 Hypo / Hyperglycemic Management (close monitor of Blood Glucose) []  - 0 Ankle / Brachial Index (ABI) - do not check if billed separately X- 1 5 Vital Signs Has the patient been seen at the hospital within the last three years: Yes Total Score: 150 Level Of Care: New/Established - Level 4 Electronic Signature(s) Signed: 10/26/2020 5:39:27 PM By: Deon Pilling Entered By: Deon Pilling on 10/26/2020 13:17:33 -------------------------------------------------------------------------------- Encounter Discharge Information Details Patient Name: Date of Service: Joshua Curtis 10/26/2020 12:30 PM Medical Record Number: [id number] Patient  Account Number: [id number] Date of Birth/Sex: Treating RN: July 13, 1951 (70 y.o. Ernestene Mention Primary Care Keianna Signer: Cathlean Cower Other Clinician: Referring Terianne Thaker: Treating Andreya Lacks/Extender: Jeri Modena in Treatment: 1 Encounter Discharge Information Items Discharge Condition: Stable Ambulatory Status: Ambulatory Discharge Destination: Home Transportation: Private Auto Accompanied By: self Schedule Follow-up Appointment: Yes Clinical Summary of Care: Patient Declined Electronic Signature(s) Signed: 10/26/2020 5:40:23 PM By: Baruch Gouty RN, BSN Entered By: Baruch Gouty on 10/26/2020 13:28:10 -------------------------------------------------------------------------------- Lower Extremity Assessment Details Patient Name: Date of Service: Joshua Curtis, Joshua Curtis 10/26/2020 12:30 PM Medical Record Number: [id number] Patient Account Number: [id number] Date of Birth/Sex: Treating RN: 11-05-1950 (70 y.o. [name] Primary Care Layden Caterino: Cathlean Cower Other Clinician: Referring Appolonia Ackert: Treating Yvonnia Tango/Extender: Jeri Modena in Treatment: 1 Edema Assessment Assessed: Shirlyn Goltz: No] Patrice Paradise: No] Edema: [Left: No] [Right: No] Calf Left: Right: Point of Measurement: 33 cm From Medial Instep 28 cm 30 cm Ankle Left: Right: Point of Measurement: 11 cm From Medial Instep 20.4 cm 20 cm Vascular Assessment Pulses: Dorsalis Pedis Palpable: [Left:Yes] [Right:Yes] Electronic Signature(s) Signed: 10/30/2020 5:56:44 PM By: Levan Hurst RN, BSN Entered By: Levan Hurst on 10/26/2020 13:02:27 -------------------------------------------------------------------------------- Multi Wound Chart Details Patient Name: Date of Service: Joshua Curtis 10/26/2020 12:30 PM Medical Record Number: [id number] Patient Account Number: [id number] Date of Birth/Sex: Treating RN: November 06, 1950 (70 y.o. [name] Primary Care Avalynne Diver: Cathlean Cower Other Clinician: Referring Judene Logue: Treating Kalon Erhardt/Extender: Jeri Modena in Treatment: 1 Vital Signs Height(in): 70 Pulse(bpm): 68 Weight(lbs): 150 Blood Pressure(mmHg): 116/68 Body Mass Index(BMI): 22 Temperature(F): 97.8 Respiratory Rate(breaths/min): 16 Photos: [1:No Photos Right T Great oe] [2:No Photos Left T Great oe] [N/A:N/A N/A] Wound Location: [1:Gradually Appeared] [2:Gradually Appeared] [N/A:N/A] Wounding Event: [1:Arterial Insufficiency Ulcer] [2:Arterial Insufficiency Ulcer] [N/A:N/A] Primary Etiology: [1:Hypertension, Peripheral Arterial] [2:Hypertension, Peripheral Arterial] [N/A:N/A] Comorbid History: [1:Disease, Peripheral  Venous Disease Disease, Peripheral Venous Disease 03/02/2020] [2:03/02/2020] [N/A:N/A] Date Acquired: [1:1] [2:1] [N/A:N/A] Weeks of Treatment: [1:Open] [2:Open] [N/A:N/A] Wound Status: [1:0.7x0.4x0.2] [2:0.4x0.4x0.3] [N/A:N/A] Measurements L x W x D (cm) [1:0.22] [2:0.126] [N/A:N/A] A (cm) : rea [1:0.044] [2:0.038] [N/A:N/A] Volume (cm) : [1:22.30%] [2:0.00%] [N/A:N/A] % Reduction in Area: [1:-57.10%] [2:-52.00%] [N/A:N/A] % Reduction in Volume: [1:Full Thickness Without Exposed] [2:Full Thickness With Exposed Support N/A] Classification: [1:Support Structures Medium] [2:Structures Medium] [N/A:N/A] Exudate Amount: [1:Serosanguineous] [2:Serosanguineous] [N/A:N/A] Exudate Type: [1:red, brown] [2:red, brown] [N/A:N/A] Exudate Color: [1:Flat and Intact] [2:Flat and Intact] [N/A:N/A] Wound Margin: [1:Large (67-100%)] [2:Large (67-100%)] [N/A:N/A] Granulation Amount: [1:Pink] [2:Pink] [N/A:N/A] Granulation Quality: [1:None Present (0%)] [2:Small (1-33%)] [N/A:N/A] Necrotic Amount: [1:Fat Layer (Subcutaneous Tissue): Yes Fat Layer (Subcutaneous Tissue): Yes N/A] Exposed Structures: [1:Fascia: No Tendon: No Muscle: No Joint: No Bone: No Small (1-33%)] [2:Bone: Yes Fascia: No Tendon: No Muscle: No Joint: No Small  (1-33%)] [N/A:N/A] Treatment Notes Electronic Signature(s) Signed: 10/26/2020 5:09:37 PM By: Linton Ham MD Signed: 10/26/2020 5:39:27 PM By: Deon Pilling Entered By: Linton Ham on 10/26/2020 13:18:35 -------------------------------------------------------------------------------- Multi-Disciplinary Care Plan Details Patient Name: Date of Service: Joshua Curtis 10/26/2020 12:30 PM Medical Record Number: [id number] Patient Account Number: [id number] Date of Birth/Sex: Treating RN: [birth year] (70 y.o. [name], [name] Primary Care Yuri Fana: Cathlean Cower Other Clinician: Referring Haylen Shelnutt: Treating Creston Klas/Extender: Jeri Modena in Treatment: 1 Active Inactive Abuse / Safety / Falls / Self Care Management Nursing Diagnoses: Potential for falls Goals: Patient will remain injury free related to falls Date Initiated: 10/19/2020 Target Resolution Date: 12/15/2020 Goal Status: Active Interventions: Provide education on fall prevention Notes: Pain, Acute or Chronic Nursing Diagnoses: Pain, acute or chronic: actual or potential Potential alteration in comfort, pain Goals: Patient will verbalize adequate pain control and receive pain control interventions during procedures as needed Date Initiated: 10/19/2020 Target Resolution Date: 11/10/2020 Goal Status: Active Patient/caregiver will verbalize comfort level met Date Initiated: 10/19/2020 Target Resolution Date: 11/10/2020 Goal Status: Active Interventions: Encourage patient to take pain medications as prescribed Provide education on pain management Notes: Wound/Skin Impairment Nursing Diagnoses: Knowledge deficit related to ulceration/compromised skin integrity Goals: Patient/caregiver will verbalize understanding of skin care regimen Date Initiated: 10/19/2020 Target Resolution Date: 11/10/2020 Goal Status: Active Interventions: Assess patient/caregiver ability to obtain necessary  supplies Assess patient/caregiver ability to perform ulcer/skin care regimen upon admission and as needed Assess ulceration(s) every visit Provide education on smoking Provide education on ulcer and skin care Treatment Activities: Skin care regimen initiated : 10/19/2020 Topical wound management initiated : 10/19/2020 Notes: Electronic Signature(s) Signed: 10/26/2020 5:39:27 PM By: Deon Pilling Entered By: Deon Pilling on 10/26/2020 12:55:50 -------------------------------------------------------------------------------- Pain Assessment Details Patient Name: Date of Service: Joshua Curtis, Joshua Curtis 10/26/2020 12:30 PM Medical Record Number: [id number] Patient Account Number: [id number] Date of Birth/Sex: Treating RN: 11-29-1950 (70 y.o. [name] Primary Care Gray Doering: Cathlean Cower Other Clinician: Referring Wasim Hurlbut: Treating Raney Antwine/Extender: Jeri Modena in Treatment: 1 Active Problems Location of Pain Severity and Description of Pain Patient Has Paino No Site Locations Pain Management and Medication Current Pain Management: Electronic Signature(s) Signed: 10/30/2020 5:56:44 PM By: Levan Hurst RN, BSN Entered By: Levan Hurst on 10/26/2020 13:02:14 -------------------------------------------------------------------------------- Patient/Caregiver Education Details Patient Name: Date of Service: Joshua Curtis 2/24/2022andnbsp12:30 PM Medical Record Number: [id number] Patient Account Number: [id number] Date of Birth/Gender: Treating RN: [birth year] (70 y.o. [name] Primary Care Physician: Cathlean Cower Other Clinician: Referring Physician: Treating Physician/Extender: Jeri Modena in Treatment: 1 Education  Assessment Education Provided To: Patient Education Topics Provided Wound/Skin Impairment: Handouts: Skin Care Do's and Dont's Methods: Explain/Verbal Responses: Reinforcements needed Electronic  Signature(s) Signed: 10/26/2020 5:39:27 PM By: Deon Pilling Entered By: Deon Pilling on 10/26/2020 12:56:02 -------------------------------------------------------------------------------- Wound Assessment Details Patient Name: Date of Service: Joshua Curtis, Joshua Curtis 10/26/2020 12:30 PM Medical Record Number: [id number] Patient Account Number: [id number] Date of Birth/Sex: Treating RN: 1950-10-02 (70 y.o. [name] Primary Care Azaliah Carrero: Cathlean Cower Other Clinician: Referring Rakiya Krawczyk: Treating Joani Cosma/Extender: Jeri Modena in Treatment: 1 Wound Status Wound Number: 1 Primary Arterial Insufficiency Ulcer Etiology: Wound Location: Right T Great oe Wound Status: Open Wounding Event: Gradually Appeared Comorbid Hypertension, Peripheral Arterial Disease, Peripheral Venous Date Acquired: 03/02/2020 History: Disease Weeks Of Treatment: 1 Clustered Wound: No Photos Photo Uploaded By: Mikeal Hawthorne on 10/27/2020 14:26:46 Wound Measurements Length: (cm) 0.7 Width: (cm) 0.4 Depth: (cm) 0.2 Area: (cm) 0.22 Volume: (cm) 0.044 % Reduction in Area: 22.3% % Reduction in Volume: -57.1% Epithelialization: Small (1-33%) Tunneling: No Undermining: No Wound Description Classification: Full Thickness Without Exposed Support Structures Wound Margin: Flat and Intact Exudate Amount: Medium Exudate Type: Serosanguineous Exudate Color: red, brown Foul Odor After Cleansing: No Slough/Fibrino No Wound Bed Granulation Amount: Large (67-100%) Exposed Structure Granulation Quality: Pink Fascia Exposed: No Necrotic Amount: None Present (0%) Fat Layer (Subcutaneous Tissue) Exposed: Yes Tendon Exposed: No Muscle Exposed: No Joint Exposed: No Bone Exposed: No Treatment Notes Wound #1 (Toe Great) Wound Laterality: Right Cleanser Soap and Water Discharge Instruction: May shower and wash wound with dial antibacterial soap and water prior to dressing  change. Peri-Wound Care Topical Primary Dressing Endoform 2x2 in Discharge Instruction: Moisten with [place] Jelly. Secondary Dressing Woven Gauze Sponges 2x2 in Discharge Instruction: Apply over primary dressing as directed. Secured With Conforming Stretch Gauze Bandage, Sterile 2x75 (in/in) Discharge Instruction: Secure with stretch gauze as directed. Compression Wrap Compression Stockings Add-Ons Electronic Signature(s) Signed: 10/30/2020 5:56:44 PM By: Levan Hurst RN, BSN Entered By: Levan Hurst on 10/26/2020 13:03:08 -------------------------------------------------------------------------------- Wound Assessment Details Patient Name: Date of Service: Joshua Curtis 10/26/2020 12:30 PM Medical Record Number: [id number] Patient Account Number: [id number] Date of Birth/Sex: Treating RN: [birth year] (70 y.o. [name] Primary Care Roshanda Balazs: Cathlean Cower Other Clinician: Referring Bibi Economos: Treating Stephaie Dardis/Extender: Jeri Modena in Treatment: 1 Wound Status Wound Number: 2 Primary Arterial Insufficiency Ulcer Etiology: Wound Location: Left T Great oe Wound Status: Open Wounding Event: Gradually Appeared Comorbid Hypertension, Peripheral Arterial Disease, Peripheral Venous Date Acquired: 03/02/2020 History: Disease Weeks Of Treatment: 1 Clustered Wound: No Photos Photo Uploaded By: Mikeal Hawthorne on 10/27/2020 14:26:47 Wound Measurements Length: (cm) 0.4 Width: (cm) 0.4 Depth: (cm) 0.3 Area: (cm) 0.126 Volume: (cm) 0.038 % Reduction in Area: 0% % Reduction in Volume: -52% Epithelialization: Small (1-33%) Tunneling: No Undermining: No Wound Description Classification: Full Thickness With Exposed Support Structures Wound Margin: Flat and Intact Exudate Amount: Medium Exudate Type: Serosanguineous Exudate Color: red, brown Foul Odor After Cleansing: No Slough/Fibrino Yes Wound Bed Granulation Amount: Large (67-100%) Exposed  Structure Granulation Quality: Pink Fascia Exposed: No Necrotic Amount: Small (1-33%) Fat Layer (Subcutaneous Tissue) Exposed: Yes Necrotic Quality: Adherent Slough Tendon Exposed: No Muscle Exposed: No Joint Exposed: No Bone Exposed: Yes Treatment Notes Wound #2 (Toe Great) Wound Laterality: Left Cleanser Soap and Water Discharge Instruction: May shower and wash wound with dial antibacterial soap and water prior to dressing change. Peri-Wound Care Topical Primary Dressing Endoform 2x2 in Discharge Instruction: Moisten with [place] Jelly. Secondary Dressing  Woven Gauze Sponges 2x2 in Discharge Instruction: Apply over primary dressing as directed. Secured With Conforming Stretch Gauze Bandage, Sterile 2x75 (in/in) Discharge Instruction: Secure with stretch gauze as directed. Compression Wrap Compression Stockings Add-Ons Electronic Signature(s) Signed: 10/30/2020 5:56:44 PM By: Levan Hurst RN, BSN Entered By: Levan Hurst on 10/26/2020 13:03:20 -------------------------------------------------------------------------------- Vitals Details Patient Name: Date of Service: Joshua Curtis 10/26/2020 12:30 PM Medical Record Number: [id number] Patient Account Number: [id number] Date of Birth/Sex: Treating RN: 22-Nov-1950 (70 y.o. [name] Primary Care Deloise Marchant: Cathlean Cower Other Clinician: Referring Kairi Harshbarger: Treating Latandra Loureiro/Extender: Jeri Modena in Treatment: 1 Vital Signs Time Taken: 12:50 Temperature (F): 97.8 Height (in): 70 Pulse (bpm): 77 Weight (lbs): 150 Respiratory Rate (breaths/min): 16 Body Mass Index (BMI): 21.5 Blood Pressure (mmHg): 116/68 Reference Range: 80 - 120 mg / dl Electronic Signature(s) Signed: 10/30/2020 5:56:44 PM By: Levan Hurst RN, BSN Entered By: Levan Hurst on 10/26/2020 13:01:51

## 2020-10-31 ENCOUNTER — Telehealth: Payer: Self-pay | Admitting: Podiatry

## 2020-10-31 NOTE — Telephone Encounter (Signed)
That is fine to put out to that date. I have him at the wound care center. He can come back to see me before release to go to work if needed

## 2020-10-31 NOTE — Telephone Encounter (Signed)
Good Morning Dr. Jacqualyn Posey  I spoke with Joshua Curtis and he said to have him returning to work 02/21/2021, I wanted to confirm that with you. I dont see that he was any returning appts. Is he coming back to see you after he is done with [place]. He also talked to me about his procedure, I just wanted to know when to have him returning to work for his Disability paperwork/

## 2020-11-03 ENCOUNTER — Telehealth: Payer: Self-pay | Admitting: Podiatry

## 2020-11-03 NOTE — Telephone Encounter (Signed)
Patient called inquiring about FMLA/Disabilty paperwork for employer and wanted to know if it was ready for pick up today. Patient is aware you are not available and will call back next week

## 2020-11-07 ENCOUNTER — Ambulatory Visit: Payer: BC Managed Care – PPO | Admitting: Radiation Oncology

## 2020-11-07 ENCOUNTER — Ambulatory Visit: Payer: BC Managed Care – PPO

## 2020-11-07 DIAGNOSIS — R3912 Poor urinary stream: Secondary | ICD-10-CM | POA: Diagnosis not present

## 2020-11-07 DIAGNOSIS — C61 Malignant neoplasm of prostate: Secondary | ICD-10-CM | POA: Diagnosis not present

## 2020-11-09 ENCOUNTER — Other Ambulatory Visit: Payer: Self-pay

## 2020-11-09 ENCOUNTER — Encounter (HOSPITAL_BASED_OUTPATIENT_CLINIC_OR_DEPARTMENT_OTHER): Payer: BC Managed Care – PPO | Attending: Internal Medicine | Admitting: Physician Assistant

## 2020-11-09 DIAGNOSIS — L97512 Non-pressure chronic ulcer of other part of right foot with fat layer exposed: Secondary | ICD-10-CM | POA: Insufficient documentation

## 2020-11-09 DIAGNOSIS — L97522 Non-pressure chronic ulcer of other part of left foot with fat layer exposed: Secondary | ICD-10-CM | POA: Diagnosis not present

## 2020-11-09 DIAGNOSIS — I70235 Atherosclerosis of native arteries of right leg with ulceration of other part of foot: Secondary | ICD-10-CM | POA: Insufficient documentation

## 2020-11-09 DIAGNOSIS — L97524 Non-pressure chronic ulcer of other part of left foot with necrosis of bone: Secondary | ICD-10-CM | POA: Insufficient documentation

## 2020-11-09 DIAGNOSIS — I70245 Atherosclerosis of native arteries of left leg with ulceration of other part of foot: Secondary | ICD-10-CM | POA: Insufficient documentation

## 2020-11-09 NOTE — Progress Notes (Addendum)
LEGEND, TUMMINELLO ([id number]) Visit Report for 11/09/2020 Chief Complaint Document Details Patient Name: Date of Service: Joshua Curtis, Joshua Curtis 11/09/2020 1:30 PM Medical Record Number: [id number] Patient Account Number: [id number] Date of Birth/Sex: Treating RN: [birth year] (70 y.o. [name] Primary Care Provider: Cathlean Cower Other Clinician: Referring Provider: Treating Provider/Extender: Roque Cash in Treatment: 3 Information Obtained from: Patient Chief Complaint 10/19/2020; patient is here for review of wounds on his bilateral anterior great toes in the setting of PAD Electronic Signature(s) Signed: 11/09/2020 1:56:07 PM By: Worthy Keeler PA-C Entered By: Worthy Keeler on 11/09/2020 13:56:06 -------------------------------------------------------------------------------- Debridement Details Patient Name: Date of Service: Joshua [place] A. 11/09/2020 1:30 PM Medical Record Number: [id number] Patient Account Number: [id number] Date of Birth/Sex: Treating RN: 1951/04/09 (70 y.o. [name], [name] Primary Care Provider: Cathlean Cower Other Clinician: Referring Provider: Treating Provider/Extender: Roque Cash in Treatment: 3 Debridement Performed for Assessment: Wound #2 Left T Great oe Performed By: Physician Worthy Keeler, PA Debridement Type: Debridement Severity of Tissue Pre Debridement: Fat layer exposed Level of Consciousness (Pre-procedure): Awake and Alert Pre-procedure Verification/Time Out Yes - 14:30 Taken: Start Time: 14:31 Pain Control: Lidocaine 4% T opical Solution T Area Debrided (L x W): otal 0.5 (cm) x 0.5 (cm) = 0.25 (cm) Tissue and other material debrided: Viable, Non-Viable, Callus, Skin: Dermis , Skin: Epidermis, Fibrin/Exudate Level: Skin/Epidermis Debridement Description: Selective/Open Wound Instrument: Curette Bleeding: Minimum Hemostasis Achieved: Pressure End Time: 14:36 Procedural Pain:  0 Post Procedural Pain: 0 Response to Treatment: Procedure was tolerated well Level of Consciousness (Post- Awake and Alert procedure): Post Debridement Measurements of Total Wound Length: (cm) 0.3 Width: (cm) 0.3 Depth: (cm) 0.1 Volume: (cm) 0.007 Character of Wound/Ulcer Post Debridement: Improved Severity of Tissue Post Debridement: Fat layer exposed Post Procedure Diagnosis Same as Pre-procedure Electronic Signature(s) Signed: 11/09/2020 5:28:00 PM By: Worthy Keeler PA-C Signed: 11/09/2020 5:47:26 PM By: Deon Pilling Entered By: Deon Pilling on 11/09/2020 14:38:05 -------------------------------------------------------------------------------- HPI Details Patient Name: Date of Service: Joshua Curtis 11/09/2020 1:30 PM Medical Record Number: [id number] Patient Account Number: [id number] Date of Birth/Sex: Treating RN: 12-Sep-1950 (70 y.o. [name] Primary Care Provider: Cathlean Cower Other Clinician: Referring Provider: Treating Provider/Extender: Roque Cash in Treatment: 3 History of Present Illness HPI Description: ADMISSION 10/19/2020 This is a 70 year old nondiabetic man developed wounds on his bilateral great toes in the summer 2021. Eventually referred to Dr. Earleen Newport who first noted these wounds I think in November. Started him on Santyl noted poor arterial exam and referred him to Dr. Trula Slade. On 07/04/2020 the patient had angiography had stents placed in the left SFA and popliteal and a failed angioplasty on the left PTA noted that he had single-vessel runoff via the peroneal. On 07/18/2020 he had an angiogram on the right with angioplasty of the right tibial peroneal trunk and peroneal artery and a stent in the right popliteal and SFA. Since then he has been followed mostly by Dr. Earleen Newport using Annitta Needs and Prisma more recently Prisma. It was noted that the left first toe healed at some point but reopened earlier this month. The patient  also saw Dr. Megan Salon with a history of osteomyelitis in both of these areas. I have not reviewed the x-rays. He received 1 month of Augmentin in December and I think that Augmentin was actually extended. His arterial evaluation prior to intervention showed an ABI on the right of 0.51 on the left 0.55.  In December these were repeated with an ABI in the right of 0.89 on the left 0.91 much improved. The patient states that he was having clear claudication symptoms beforehand now he does not have any pain in his legs when he walks although his activity is still somewhat limited. We did not repeat his ABIs in the clinic Past medical history the patient is not a diabetic but he is a smoker stating he is trying to quit and smoked 2 cigarettes today. He has a history of hypertension, prostate CA currently trying to make a decision between surgery and radiation, pulmonary hypertension and a history of PAD 2/24; patient is using endoform. His wounds look better today. I did review his x-rays subtle changes of osteomyelitis. He was treated by Dr. Megan Salon with least a month of Augmentin possibly more. 11/09/2020 on evaluation today patient actually appears to be doing well with his wounds. Both are showing signs of improvement. The right great toe actually is fairly clean today on evaluation. The left toes probably can require some debridement. Neither wound shows any signs of infection we have been using endoform effectively up to this point. He has been managed due to some subtle changes with regard to osteomyelitis noted on his x-rays by Dr. Megan Salon. He has been taking Augmentin. Electronic Signature(s) Signed: 11/09/2020 2:46:29 PM By: Worthy Keeler PA-C Entered By: Worthy Keeler on 11/09/2020 14:46:29 -------------------------------------------------------------------------------- Physical Exam Details Patient Name: Date of Service: Joshua Curtis, Joshua Curtis 11/09/2020 1:30 PM Medical Record Number:  [id number] Patient Account Number: [id number] Date of Birth/Sex: Treating RN: [birth year] (70 y.o. [name] Primary Care Provider: Cathlean Cower Other Clinician: Referring Provider: Treating Provider/Extender: Roque Cash in Treatment: 3 Constitutional Well-nourished and well-hydrated in no acute distress. Respiratory normal breathing without difficulty. Psychiatric this patient is able to make decisions and demonstrates good insight into disease process. Alert and Oriented x 3. pleasant and cooperative. Notes Patient's wound on the right great toe did not require any debridement mechanically I did clean this with gauze and post mechanical debridement that appeared to be doing excellent the wound bed was great and required no further sharp debridement. In regard to the left great toe I did perform sharp debridement here to clear away some of the potential old dressing, skin, and drainage buildup. This did appear to be doing much better post debridement the wound bed is healthy and I think both are healing quite nicely. Electronic Signature(s) Signed: 11/09/2020 2:48:00 PM By: Worthy Keeler PA-C Entered By: Worthy Keeler on 11/09/2020 14:48:00 -------------------------------------------------------------------------------- Physician Orders Details Patient Name: Date of Service: Joshua Curtis 11/09/2020 1:30 PM Medical Record Number: [id number] Patient Account Number: [id number] Date of Birth/Sex: Treating RN: 1951-04-23 (70 y.o. [name], [name] Primary Care Provider: Cathlean Cower Other Clinician: Referring Provider: Treating Provider/Extender: Roque Cash in Treatment: 3 Verbal / Phone Orders: No Diagnosis Coding ICD-10 Coding Code Description [id number] Atherosclerosis of native arteries of right leg with ulceration of other part of foot I70.245 Atherosclerosis of native arteries of left leg with ulceration of other part of  foot L97.512 Non-pressure chronic ulcer of other part of right foot with fat layer exposed L97.524 Non-pressure chronic ulcer of other part of left foot with necrosis of bone Follow-up Appointments Return Appointment in 2 weeks. Bathing/ Shower/ Hygiene May shower with protection but do not get wound dressing(s) wet. - when not changing dressings. May shower and wash wound with soap and  water. - with dressing changes only. Edema Control - Lymphedema / SCD / Other Moisturize legs daily. - every night before bed. Off-Loading Open toe surgical shoe to: - patient to wear daily. no pressure from regular shoes. Wound Treatment Wound #1 - T Great oe Wound Laterality: Right Cleanser: Soap and Water Every Other Day/30 Days Discharge Instructions: May shower and wash wound with dial antibacterial soap and water prior to dressing change. Prim Dressing: Endoform 2x2 in Every Other Day/30 Days ary Discharge Instructions: Moisten with [place] Jelly. Secondary Dressing: Woven Gauze Sponges 2x2 in (DME) (Generic) Every Other Day/30 Days Discharge Instructions: Apply over primary dressing as directed. Secured With: Child psychotherapist, Sterile 2x75 (in/in) (DME) (Generic) Every Other Day/30 Days Discharge Instructions: Secure with stretch gauze as directed. Secured With: 87M Medipore H Soft Cloth Surgical Tape, 2x2 (in/yd) (DME) (Generic) Every Other Day/30 Days Discharge Instructions: Secure dressing with tape as directed. Wound #2 - T Great oe Wound Laterality: Left Cleanser: Soap and Water Every Other Day/30 Days Discharge Instructions: May shower and wash wound with dial antibacterial soap and water prior to dressing change. Prim Dressing: Endoform 2x2 in Every Other Day/30 Days ary Discharge Instructions: Moisten with [place] Jelly. Secondary Dressing: Woven Gauze Sponges 2x2 in (DME) (Generic) Every Other Day/30 Days Discharge Instructions: Apply over primary dressing as directed. Secured  With: Child psychotherapist, Sterile 2x75 (in/in) (DME) (Generic) Every Other Day/30 Days Discharge Instructions: Secure with stretch gauze as directed. Secured With: 87M Medipore H Soft Cloth Surgical Tape, 2x2 (in/yd) (DME) (Generic) Every Other Day/30 Days Discharge Instructions: Secure dressing with tape as directed. Electronic Signature(s) Signed: 11/09/2020 5:28:00 PM By: Worthy Keeler PA-C Signed: 11/09/2020 5:47:26 PM By: Deon Pilling Entered By: Deon Pilling on 11/09/2020 14:41:21 -------------------------------------------------------------------------------- Problem List Details Patient Name: Date of Service: Joshua Curtis 11/09/2020 1:30 PM Medical Record Number: [id number] Patient Account Number: [id number] Date of Birth/Sex: Treating RN: [birth year] (70 y.o. [name], [name] Primary Care Provider: Cathlean Cower Other Clinician: Referring Provider: Treating Provider/Extender: Roque Cash in Treatment: 3 Active Problems ICD-10 Encounter Code Description Active Date MDM Diagnosis I70.235 Atherosclerosis of native arteries of right leg with ulceration of other part of 10/19/2020 No Yes foot I70.245 Atherosclerosis of native arteries of left leg with ulceration of other part of 10/19/2020 No Yes foot L97.512 Non-pressure chronic ulcer of other part of right foot with fat layer exposed 10/19/2020 No Yes L97.524 Non-pressure chronic ulcer of other part of left foot with necrosis of bone 10/19/2020 No Yes Inactive Problems Resolved Problems Electronic Signature(s) Signed: 11/09/2020 1:56:01 PM By: Worthy Keeler PA-C Entered By: Worthy Keeler on 11/09/2020 13:56:01 -------------------------------------------------------------------------------- Progress Note Details Patient Name: Date of Service: Joshua Curtis 11/09/2020 1:30 PM Medical Record Number: [id number] Patient Account Number: [id number] Date of Birth/Sex: Treating  RN: 1950/12/22 (70 y.o. [name] Primary Care Provider: Cathlean Cower Other Clinician: Referring Provider: Treating Provider/Extender: Roque Cash in Treatment: 3 Subjective Chief Complaint Information obtained from Patient 10/19/2020; patient is here for review of wounds on his bilateral anterior great toes in the setting of PAD History of Present Illness (HPI) ADMISSION 10/19/2020 This is a 70 year old nondiabetic man developed wounds on his bilateral great toes in the summer 2021. Eventually referred to Dr. Earleen Newport who first noted these wounds I think in November. Started him on Santyl noted poor arterial exam and referred him to Dr. Trula Slade. On 07/04/2020 the patient had angiography  had stents placed in the left SFA and popliteal and a failed angioplasty on the left PTA noted that he had single-vessel runoff via the peroneal. On 07/18/2020 he had an angiogram on the right with angioplasty of the right tibial peroneal trunk and peroneal artery and a stent in the right popliteal and SFA. Since then he has been followed mostly by Dr. Earleen Newport using Annitta Needs and Prisma more recently Prisma. It was noted that the left first toe healed at some point but reopened earlier this month. The patient also saw Dr. Megan Salon with a history of osteomyelitis in both of these areas. I have not reviewed the x-rays. He received 1 month of Augmentin in December and I think that Augmentin was actually extended. His arterial evaluation prior to intervention showed an ABI on the right of 0.51 on the left 0.55. In December these were repeated with an ABI in the right of 0.89 on the left 0.91 much improved. The patient states that he was having clear claudication symptoms beforehand now he does not have any pain in his legs when he walks although his activity is still somewhat limited. We did not repeat his ABIs in the clinic Past medical history the patient is not a diabetic but he is a smoker  stating he is trying to quit and smoked 2 cigarettes today. He has a history of hypertension, prostate CA currently trying to make a decision between surgery and radiation, pulmonary hypertension and a history of PAD 2/24; patient is using endoform. His wounds look better today. I did review his x-rays subtle changes of osteomyelitis. He was treated by Dr. Megan Salon with least a month of Augmentin possibly more. 11/09/2020 on evaluation today patient actually appears to be doing well with his wounds. Both are showing signs of improvement. The right great toe actually is fairly clean today on evaluation. The left toes probably can require some debridement. Neither wound shows any signs of infection we have been using endoform effectively up to this point. He has been managed due to some subtle changes with regard to osteomyelitis noted on his x-rays by Dr. Megan Salon. He has been taking Augmentin. Objective Constitutional Well-nourished and well-hydrated in no acute distress. Vitals Time Taken: 1:38 PM, Height: 70 in, Weight: 150 lbs, BMI: 21.5, Temperature: 98.7 F, Pulse: 101 bpm, Respiratory Rate: 17 breaths/min, Blood Pressure: 149/74 mmHg. Respiratory normal breathing without difficulty. Psychiatric this patient is able to make decisions and demonstrates good insight into disease process. Alert and Oriented x 3. pleasant and cooperative. General Notes: Patient's wound on the right great toe did not require any debridement mechanically I did clean this with gauze and post mechanical debridement that appeared to be doing excellent the wound bed was great and required no further sharp debridement. In regard to the left great toe I did perform sharp debridement here to clear away some of the potential old dressing, skin, and drainage buildup. This did appear to be doing much better post debridement the wound bed is healthy and I think both are healing quite nicely. Integumentary (Hair, Skin) Wound  #1 status is Open. Original cause of wound was Gradually Appeared. The date acquired was: 03/02/2020. The wound has been in treatment 3 weeks. The wound is located on the Right T Great. The wound measures 0.4cm length x 0.3cm width x 0.2cm depth; 0.094cm^2 area and 0.019cm^3 volume. There is Fat oe Layer (Subcutaneous Tissue) exposed. There is no tunneling or undermining noted. There is a medium amount of serosanguineous  drainage noted. The wound margin is flat and intact. There is large (67-100%) pink granulation within the wound bed. There is no necrotic tissue within the wound bed. Wound #2 status is Open. Original cause of wound was Gradually Appeared. The date acquired was: 03/02/2020. The wound has been in treatment 3 weeks. The wound is located on the Left T Great. The wound measures 0.3cm length x 0.3cm width x 0.1cm depth; 0.071cm^2 area and 0.007cm^3 volume. There is Fat oe Layer (Subcutaneous Tissue) exposed. There is no tunneling or undermining noted. There is a medium amount of serosanguineous drainage noted. The wound margin is flat and intact. There is large (67-100%) pink granulation within the wound bed. There is no necrotic tissue within the wound bed. Assessment Active Problems ICD-10 Atherosclerosis of native arteries of right leg with ulceration of other part of foot Atherosclerosis of native arteries of left leg with ulceration of other part of foot Non-pressure chronic ulcer of other part of right foot with fat layer exposed Non-pressure chronic ulcer of other part of left foot with necrosis of bone Procedures Wound #2 Pre-procedure diagnosis of Wound #2 is an Arterial Insufficiency Ulcer located on the Left T Great .Severity of Tissue Pre Debridement is: Fat layer oe exposed. There was a Selective/Open Wound Skin/Epidermis Debridement with a total area of 0.25 sq cm performed by Worthy Keeler, PA. With the following instrument(s): Curette to remove Viable and Non-Viable  tissue/material. Material removed includes Callus, Skin: Dermis, Skin: Epidermis, and Fibrin/Exudate after achieving pain control using Lidocaine 4% Topical Solution. A time out was conducted at 14:30, prior to the start of the procedure. A Minimum amount of bleeding was controlled with Pressure. The procedure was tolerated well with a pain level of 0 throughout and a pain level of 0 following the procedure. Post Debridement Measurements: 0.3cm length x 0.3cm width x 0.1cm depth; 0.007cm^3 volume. Character of Wound/Ulcer Post Debridement is improved. Severity of Tissue Post Debridement is: Fat layer exposed. Post procedure Diagnosis Wound #2: Same as Pre-Procedure Plan Follow-up Appointments: Return Appointment in 2 weeks. Bathing/ Shower/ Hygiene: May shower with protection but do not get wound dressing(s) wet. - when not changing dressings. May shower and wash wound with soap and water. - with dressing changes only. Edema Control - Lymphedema / SCD / Other: Moisturize legs daily. - every night before bed. Off-Loading: Open toe surgical shoe to: - patient to wear daily. no pressure from regular shoes. WOUND #1: - T Great Wound Laterality: Right oe Cleanser: Soap and Water Every Other Day/30 Days Discharge Instructions: May shower and wash wound with dial antibacterial soap and water prior to dressing change. Prim Dressing: Endoform 2x2 in Every Other Day/30 Days ary Discharge Instructions: Moisten with [place] Jelly. Secondary Dressing: Woven Gauze Sponges 2x2 in (DME) (Generic) Every Other Day/30 Days Discharge Instructions: Apply over primary dressing as directed. Secured With: Child psychotherapist, Sterile 2x75 (in/in) (DME) (Generic) Every Other Day/30 Days Discharge Instructions: Secure with stretch gauze as directed. Secured With: 33M Medipore H Soft Cloth Surgical T ape, 2x2 (in/yd) (DME) (Generic) Every Other Day/30 Days Discharge Instructions: Secure dressing with tape as  directed. WOUND #2: - T Great Wound Laterality: Left oe Cleanser: Soap and Water Every Other Day/30 Days Discharge Instructions: May shower and wash wound with dial antibacterial soap and water prior to dressing change. Prim Dressing: Endoform 2x2 in Every Other Day/30 Days ary Discharge Instructions: Moisten with [place] Jelly. Secondary Dressing: Woven Gauze Sponges 2x2 in (DME) (  Generic) Every Other Day/30 Days Discharge Instructions: Apply over primary dressing as directed. Secured With: Child psychotherapist, Sterile 2x75 (in/in) (DME) (Generic) Every Other Day/30 Days Discharge Instructions: Secure with stretch gauze as directed. Secured With: 37M Medipore H Soft Cloth Surgical T ape, 2x2 (in/yd) (DME) (Generic) Every Other Day/30 Days Discharge Instructions: Secure dressing with tape as directed. 1. I would recommend currently that we continue with endoform per above I think that is doing well for the patient. 2. I am also can recommend that we continue to monitor for any signs of infection or worsening in general. Obviously I do not see anything right now that appears to be a significant issue. We will definitely keep eyes on this however. We will see patient back for reevaluation in 2 weeks here in the clinic. If anything worsens or changes patient will contact our office for additional recommendations. Electronic Signature(s) Signed: 11/09/2020 5:23:35 PM By: Worthy Keeler PA-C Entered By: Worthy Keeler on 11/09/2020 17:23:34 -------------------------------------------------------------------------------- SuperBill Details Patient Name: Date of Service: Joshua Curtis 11/09/2020 Medical Record Number: [id number] Patient Account Number: [id number] Date of Birth/Sex: Treating RN: [birth year] (70 y.o. [name], [name] Primary Care Provider: Cathlean Cower Other Clinician: Referring Provider: Treating Provider/Extender: Roque Cash in Treatment:  3 Diagnosis Coding ICD-10 Codes Code Description [phone] Atherosclerosis of native arteries of right leg with ulceration of other part of foot I70.245 Atherosclerosis of native arteries of left leg with ulceration of other part of foot L97.512 Non-pressure chronic ulcer of other part of right foot with fat layer exposed L97.524 Non-pressure chronic ulcer of other part of left foot with necrosis of bone Facility Procedures CPT4 Code: [id number] Description: [phone] - DEBRIDE WOUND 1ST 20 SQ CM OR < ICD-10 Diagnosis Description L97.524 Non-pressure chronic ulcer of other part of left foot with necrosis of bone Modifier: Quantity: 1 Physician Procedures : CPT4 Code Description Modifier [id number] - WC PHYS DEBR WO ANESTH 20 SQ CM ICD-10 Diagnosis Description L97.524 Non-pressure chronic ulcer of other part of left foot with necrosis of bone Quantity: 1 Electronic Signature(s) Signed: 11/09/2020 5:25:04 PM By: Worthy Keeler PA-C Entered By: Worthy Keeler on 11/09/2020 17:25:03

## 2020-11-09 NOTE — Progress Notes (Addendum)
APOLO, CUTSHAW ([id number]) Visit Report for 11/09/2020 Arrival Information Details Patient Name: Date of Service: Joshua Curtis, Joshua Curtis 11/09/2020 1:30 PM Medical Record Number: [id number] Patient Account Number: [id number] Date of Birth/Sex: Treating RN: [birth year] (70 y.o. [name], [name] Primary Care Rosalin Buster: Cathlean Cower Other Clinician: Referring Zakariah Urwin: Treating Mannie Wineland/Extender: Roque Cash in Treatment: 3 Visit Information History Since Last Visit Added or deleted any medications: No Patient Arrived: Ambulatory Any new allergies or adverse reactions: No Arrival Time: 13:38 Had a fall or experienced change in No Accompanied By: self activities of daily living that may affect Transfer Assistance: None risk of falls: Patient Identification Verified: Yes Signs or symptoms of abuse/neglect since last visito No Secondary Verification Process Completed: Yes Hospitalized since last visit: No Patient Requires Transmission-Based Precautions: No Implantable device outside of the clinic excluding No Patient Has Alerts: Yes cellular tissue based products placed in the center Patient Alerts: Patient on Blood Thinner since last visit: L ABI: 0.91 Has Dressing in Place as Prescribed: Yes R ABI: 0.89 (08/2020) Pain Present Now: No Electronic Signature(s) Signed: 11/09/2020 5:24:28 PM By: Rhae Hammock RN Entered By: Rhae Hammock on 11/09/2020 13:38:43 -------------------------------------------------------------------------------- Encounter Discharge Information Details Patient Name: Date of Service: Joshua Curtis 11/09/2020 1:30 PM Medical Record Number: [id number] Patient Account Number: [id number] Date of Birth/Sex: Treating RN: [birth year] (70 y.o. [name] Primary Care Wreatha Sturgeon: Cathlean Cower Other Clinician: Referring Blondell Laperle: Treating Alastor Kneale/Extender: Roque Cash in Treatment: 3 Encounter Discharge  Information Items Post Procedure Vitals Discharge Condition: Stable Temperature (F): 98.7 Ambulatory Status: Ambulatory Pulse (bpm): 101 Discharge Destination: Home Respiratory Rate (breaths/min): 17 Transportation: Private Auto Blood Pressure (mmHg): 149/74 Accompanied By: self Schedule Follow-up Appointment: Yes Clinical Summary of Care: Electronic Signature(s) Signed: 11/09/2020 5:47:26 PM By: Deon Pilling Entered By: Deon Pilling on 11/09/2020 15:44:01 -------------------------------------------------------------------------------- Lower Extremity Assessment Details Patient Name: Date of Service: Joshua Curtis, Joshua Curtis 11/09/2020 1:30 PM Medical Record Number: [id number] Patient Account Number: [id number] Date of Birth/Sex: Treating RN: 1951/02/21 (70 y.o. [name], [name] Primary Care Ambry Dix: Cathlean Cower Other Clinician: Referring Ulisses Vondrak: Treating Jayanth Szczesniak/Extender: Roque Cash in Treatment: 3 Edema Assessment Assessed: Shirlyn Goltz: No] Patrice Paradise: No] Edema: [Left: No] [Right: No] Calf Left: Right: Point of Measurement: 33 cm From Medial Instep 28 cm 30 cm Ankle Left: Right: Point of Measurement: 11 cm From Medial Instep 20.5 cm 21.5 cm Vascular Assessment Pulses: Dorsalis Pedis Palpable: [Left:Yes] [Right:Yes] Posterior Tibial Palpable: [Left:Yes] [Right:Yes] Electronic Signature(s) Signed: 11/09/2020 5:24:28 PM By: Rhae Hammock RN Entered By: Rhae Hammock on 11/09/2020 13:42:15 -------------------------------------------------------------------------------- Emily Details Patient Name: Date of Service: Joshua Curtis 11/09/2020 1:30 PM Medical Record Number: [id number] Patient Account Number: [id number] Date of Birth/Sex: Treating RN: July 15, 1951 (70 y.o. [name] Primary Care Capri Raben: Cathlean Cower Other Clinician: Referring Cashis Rill: Treating Malcom Selmer/Extender: Roque Cash  in Treatment: 3 Active Inactive Abuse / Safety / Falls / Self Care Management Nursing Diagnoses: Potential for falls Goals: Patient will remain injury free related to falls Date Initiated: 10/19/2020 Target Resolution Date: 12/15/2020 Goal Status: Active Interventions: Provide education on fall prevention Notes: Pain, Acute or Chronic Nursing Diagnoses: Pain, acute or chronic: actual or potential Potential alteration in comfort, pain Goals: Patient will verbalize adequate pain control and receive pain control interventions during procedures as needed Date Initiated: 10/19/2020 Target Resolution Date: 11/10/2020 Goal Status: Active Patient/caregiver will verbalize comfort level met Date Initiated: 10/19/2020 Target Resolution Date: 11/10/2020  Goal Status: Active Interventions: Encourage patient to take pain medications as prescribed Provide education on pain management Notes: Wound/Skin Impairment Nursing Diagnoses: Knowledge deficit related to ulceration/compromised skin integrity Goals: Patient/caregiver will verbalize understanding of skin care regimen Date Initiated: 10/19/2020 Target Resolution Date: 11/10/2020 Goal Status: Active Interventions: Assess patient/caregiver ability to obtain necessary supplies Assess patient/caregiver ability to perform ulcer/skin care regimen upon admission and as needed Assess ulceration(s) every visit Provide education on smoking Provide education on ulcer and skin care Treatment Activities: Skin care regimen initiated : 10/19/2020 Topical wound management initiated : 10/19/2020 Notes: Electronic Signature(s) Signed: 11/09/2020 5:47:26 PM By: Deon Pilling Entered By: Deon Pilling on 11/09/2020 14:36:08 -------------------------------------------------------------------------------- Pain Assessment Details Patient Name: Date of Service: Joshua Curtis, Joshua Curtis 11/09/2020 1:30 PM Medical Record Number: [id number] Patient Account Number:  [id number] Date of Birth/Sex: Treating RN: Jun 16, 1951 (70 y.o. [name], [name] Primary Care Provider: Cathlean Cower Other Clinician: Referring Provider: Treating Provider/Extender: Roque Cash in Treatment: 3 Active Problems Location of Pain Severity and Description of Pain Patient Has Paino No Site Locations Pain Management and Medication Current Pain Management: Electronic Signature(s) Signed: 11/09/2020 5:24:28 PM By: Rhae Hammock RN Entered By: Rhae Hammock on 11/09/2020 13:41:51 -------------------------------------------------------------------------------- Patient/Caregiver Education Details Patient Name: Date of Service: Joshua Curtis 3/10/2022andnbsp1:30 PM Medical Record Number: [id number] Patient Account Number: [id number] Date of Birth/Gender: Treating RN: [birth year] (70 y.o. [name] Primary Care Physician: Cathlean Cower Other Clinician: Referring Physician: Treating Physician/Extender: Roque Cash in Treatment: 3 Education Assessment Education Provided To: Patient Education Topics Provided Wound/Skin Impairment: Handouts: Skin Care Do's and Dont's Methods: Explain/Verbal Responses: Reinforcements needed Electronic Signature(s) Signed: 11/09/2020 5:47:26 PM By: Deon Pilling Entered By: Deon Pilling on 11/09/2020 14:36:28 -------------------------------------------------------------------------------- Wound Assessment Details Patient Name: Date of Service: Joshua Curtis, Joshua Curtis 11/09/2020 1:30 PM Medical Record Number: [id number] Patient Account Number: [id number] Date of Birth/Sex: Treating RN: 01/10/1951 (70 y.o. 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 T  Great oe Wound Status: Open Wounding Event: Gradually Appeared Comorbid Hypertension, Peripheral Arterial Disease, Peripheral Venous Date Acquired: 03/02/2020 History: Disease Weeks Of Treatment: 3 Clustered Wound: No Photos Wound Measurements Length: (cm) 0.4 Width: (cm) 0.3 Depth: (cm) 0.2 Area: (cm) 0.094 Volume: (cm) 0.019 % Reduction in Area: 66.8% % Reduction in Volume: 32.1% Epithelialization: Medium (34-66%) Tunneling: No Undermining: No Wound Description Classification: Full Thickness Without Exposed Support Structures Wound Margin: Flat and Intact Exudate Amount: Medium Exudate Type: Serosanguineous Exudate Color: red, brown Foul Odor After Cleansing: No Slough/Fibrino No Wound Bed Granulation Amount: Large (67-100%) Exposed Structure Granulation Quality: Pink Fascia Exposed: No Necrotic Amount: None Present (0%) Fat Layer (Subcutaneous Tissue) Exposed: Yes Tendon Exposed: No Muscle Exposed: No Joint Exposed: No Bone Exposed: No Treatment Notes Wound #1 (Toe Great) Wound Laterality: Right Cleanser Soap and Water Discharge Instruction: May shower and wash wound with dial antibacterial soap and water prior to dressing change. Peri-Wound Care Topical Primary Dressing Endoform 2x2 in Discharge Instruction: Moisten with [place] Jelly. Secondary Dressing Woven Gauze Sponges 2x2 in Discharge Instruction: Apply over primary dressing as directed. Secured With Conforming Stretch Gauze Bandage, Sterile 2x75 (in/in) Discharge Instruction: Secure with stretch gauze as directed. 13M Medipore H Soft Cloth Surgical T ape, 2x2 (in/yd) Discharge Instruction: Secure dressing with tape as directed. Compression Wrap Compression Stockings Add-Ons  Electronic Signature(s) Signed: 11/10/2020 11:17:21 AM By: Sandre Kitty Signed: 11/10/2020 5:22:03 PM By: Rhae Hammock RN Previous Signature: 11/09/2020 5:24:28 PM Version By: Rhae Hammock RN Entered By: Sandre Kitty on 11/10/2020 10:53:05 -------------------------------------------------------------------------------- Wound Assessment Details Patient Name: Date of Service: Joshua Curtis 11/09/2020 1:30 PM Medical Record Number: [id number] Patient Account Number: [id number] Date of Birth/Sex: Treating RN: 04/12/1951 (70 y.o. [name], [name] Primary Care Sherial Ebrahim: Cathlean Cower Other Clinician: Referring Carlon Chaloux: Treating Jream Broyles/Extender: Roque Cash in Treatment: 3 Wound Status Wound Number: 2 Primary Arterial Insufficiency Ulcer Etiology: Wound Location: Left T Great oe Wound Status: Open Wounding Event: Gradually Appeared Comorbid Hypertension, Peripheral Arterial Disease, Peripheral Venous Date Acquired: 03/02/2020 History: Disease Weeks Of Treatment: 3 Clustered Wound: No Photos Wound Measurements Length: (cm) 0.3 Width: (cm) 0.3 Depth: (cm) 0.1 Area: (cm) 0.071 Volume: (cm) 0.007 % Reduction in Area: 43.7% % Reduction in Volume: 72% Epithelialization: Large (67-100%) Tunneling: No Undermining: No Wound Description Classification: Full Thickness With Exposed Support Structures Wound Margin: Flat and Intact Exudate Amount: Medium Exudate Type: Serosanguineous Exudate Color: red, brown Foul Odor After Cleansing: No Slough/Fibrino No Wound Bed Granulation Amount: Large (67-100%) Exposed Structure Granulation Quality: Pink Fascia Exposed: No Necrotic Amount: None Present (0%) Fat Layer (Subcutaneous Tissue) Exposed: Yes Tendon Exposed: No Muscle Exposed: No Joint Exposed: No Bone Exposed: No Treatment Notes Wound #2 (Toe Great) Wound Laterality: Left Cleanser Soap and Water Discharge Instruction: May shower and wash wound with dial antibacterial soap and water prior to dressing change. Peri-Wound Care Topical Primary Dressing Endoform 2x2 in Discharge Instruction: Moisten with [place] Jelly. Secondary Dressing Woven Gauze Sponges  2x2 in Discharge Instruction: Apply over primary dressing as directed. Secured With Conforming Stretch Gauze Bandage, Sterile 2x75 (in/in) Discharge Instruction: Secure with stretch gauze as directed. 67M Medipore H Soft Cloth Surgical T ape, 2x2 (in/yd) Discharge Instruction: Secure dressing with tape as directed. Compression Wrap Compression Stockings Add-Ons Electronic Signature(s) Signed: 11/10/2020 11:17:21 AM By: Sandre Kitty Signed: 11/10/2020 5:29:13 PM By: Deon Pilling Previous Signature: 11/09/2020 5:47:26 PM Version By: Deon Pilling Entered By: Sandre Kitty on 11/10/2020 10:51:28 -------------------------------------------------------------------------------- Vitals Details Patient Name: Date of Service: Joshua [place] A. 11/09/2020 1:30 PM Medical Record Number: [id number] Patient Account Number: [id number] Date of Birth/Sex: Treating RN: 09/10/1950 (70 y.o. [name], [name] Primary Care Tanita Palinkas: Cathlean Cower Other Clinician: Referring Kaelen Caughlin: Treating Jameil Whitmoyer/Extender: Roque Cash in Treatment: 3 Vital Signs Time Taken: 13:38 Temperature (F): 98.7 Height (in): 70 Pulse (bpm): 101 Weight (lbs): 150 Respiratory Rate (breaths/min): 17 Body Mass Index (BMI): 21.5 Blood Pressure (mmHg): 149/74 Reference Range: 80 - 120 mg / dl Electronic Signature(s) Signed: 11/09/2020 5:24:28 PM By: Rhae Hammock RN Entered By: Rhae Hammock on 11/09/2020 13:41:46

## 2020-11-16 DIAGNOSIS — S91301A Unspecified open wound, right foot, initial encounter: Secondary | ICD-10-CM | POA: Diagnosis not present

## 2020-11-17 DIAGNOSIS — C61 Malignant neoplasm of prostate: Secondary | ICD-10-CM | POA: Diagnosis not present

## 2020-11-20 ENCOUNTER — Ambulatory Visit: Payer: BC Managed Care – PPO

## 2020-11-20 ENCOUNTER — Ambulatory Visit (HOSPITAL_COMMUNITY): Payer: BC Managed Care – PPO

## 2020-11-20 ENCOUNTER — Ambulatory Visit (HOSPITAL_COMMUNITY): Payer: BC Managed Care – PPO | Attending: Surgery

## 2020-11-23 ENCOUNTER — Encounter (HOSPITAL_BASED_OUTPATIENT_CLINIC_OR_DEPARTMENT_OTHER): Payer: BC Managed Care – PPO | Admitting: Internal Medicine

## 2020-11-23 ENCOUNTER — Other Ambulatory Visit: Payer: Self-pay

## 2020-11-23 DIAGNOSIS — L97512 Non-pressure chronic ulcer of other part of right foot with fat layer exposed: Secondary | ICD-10-CM | POA: Diagnosis not present

## 2020-11-23 DIAGNOSIS — I70235 Atherosclerosis of native arteries of right leg with ulceration of other part of foot: Secondary | ICD-10-CM | POA: Diagnosis not present

## 2020-11-23 DIAGNOSIS — L97524 Non-pressure chronic ulcer of other part of left foot with necrosis of bone: Secondary | ICD-10-CM | POA: Diagnosis not present

## 2020-11-23 DIAGNOSIS — I70245 Atherosclerosis of native arteries of left leg with ulceration of other part of foot: Secondary | ICD-10-CM | POA: Diagnosis not present

## 2020-11-23 NOTE — Progress Notes (Signed)
Joshua, Curtis ([id number]) Visit Report for 11/23/2020 HPI Details Patient Name: Date of Service: Joshua Curtis, Joshua Curtis 11/23/2020 12:30 PM Medical Record Number: [id number] Patient Account Number: [id number] Date of Birth/Sex: Treating RN: 02-08-1951 (70 y.o. [name] Primary Care Provider: Cathlean Cower Other Clinician: Referring Provider: Treating Provider/Extender: Jeri Modena in Treatment: 5 History of Present Illness HPI Description: ADMISSION 10/19/2020 This is a 70 year old nondiabetic man developed wounds on his bilateral great toes in the summer 2021. Eventually referred to Dr. Earleen Newport who first noted these wounds I think in November. Started him on Santyl noted poor arterial exam and referred him to Dr. Trula Slade. On 07/04/2020 the patient had angiography had stents placed in the left SFA and popliteal and a failed angioplasty on the left PTA noted that he had single-vessel runoff via the peroneal. On 07/18/2020 he had an angiogram on the right with angioplasty of the right tibial peroneal trunk and peroneal artery and a stent in the right popliteal and SFA. Since then he has been followed mostly by Dr. Earleen Newport using Annitta Needs and Prisma more recently Prisma. It was noted that the left first toe healed at some point but reopened earlier this month. The patient also saw Dr. Megan Salon with a history of osteomyelitis in both of these areas. I have not reviewed the x-rays. He received 1 month of Augmentin in December and I think that Augmentin was actually extended. His arterial evaluation prior to intervention showed an ABI on the right of 0.51 on the left 0.55. In December these were repeated with an ABI in the right of 0.89 on the left 0.91 much improved. The patient states that he was having clear claudication symptoms beforehand now he does not have any pain in his legs when he walks although his activity is still somewhat limited. We did not repeat his ABIs in  the clinic Past medical history the patient is not a diabetic but he is a smoker stating he is trying to quit and smoked 2 cigarettes today. He has a history of hypertension, prostate CA currently trying to make a decision between surgery and radiation, pulmonary hypertension and a history of PAD 2/24; patient is using endoform. His wounds look better today. I did review his x-rays subtle changes of osteomyelitis. He was treated by Dr. Megan Salon with least a month of Augmentin possibly more. 11/09/2020 on evaluation today patient actually appears to be doing well with his wounds. Both are showing signs of improvement. The right great toe actually is fairly clean today on evaluation. The left toes probably can require some debridement. Neither wound shows any signs of infection we have been using endoform effectively up to this point. He has been managed due to some subtle changes with regard to osteomyelitis noted on his x-rays by Dr. Megan Salon. He has been taking Augmentin. 3/24; both wounds are showing improvement today. Under illumination the surfaces look satisfactory and the dimensions are smaller bilaterally. Electronic Signature(s) Signed: 11/23/2020 4:40:49 PM By: Linton Ham MD Entered By: Linton Ham on 11/23/2020 13:06:34 -------------------------------------------------------------------------------- Physical Exam Details Patient Name: Date of Service: Joshua Curtis 11/23/2020 12:30 PM Medical Record Number: [id number] Patient Account Number: [id number] Date of Birth/Sex: Treating RN: May 19, 1951 (70 y.o. [name] Primary Care Provider: Cathlean Cower Other Clinician: Referring Provider: Treating Provider/Extender: Jeri Modena in Treatment: 5 Constitutional Sitting or standing Blood Pressure is within target range for patient.. Pulse regular and within target range for patient.Marland Kitchen Respirations regular, non-labored and  within target range..  Temperature is normal and within the target range for the patient.Marland Kitchen Appears in no distress. Cardiovascular Dorsalis pedis pulses palpable but reduced. Notes Wound exam; small area on the left great toe at the tip of where the nailbed should be enough slightly larger area on the right. Both wound beds are in roughly the same condition may be not quite as vibrant as I would like to see however I did not debride these today. Electronic Signature(s) Signed: 11/23/2020 4:40:49 PM By: Linton Ham MD Entered By: Linton Ham on 11/23/2020 13:07:44 -------------------------------------------------------------------------------- Physician Orders Details Patient Name: Date of Service: Joshua Curtis 11/23/2020 12:30 PM Medical Record Number: [id number] Patient Account Number: [id number] Date of Birth/Sex: Treating RN: [birth year] (70 y.o. [name], [name] Primary Care Provider: Cathlean Cower Other Clinician: Referring Provider: Treating Provider/Extender: Jeri Modena in Treatment: 5 Verbal / Phone Orders: No Diagnosis Coding ICD-10 Coding Code Description [phone] Atherosclerosis of native arteries of right leg with ulceration of other part of foot I70.245 Atherosclerosis of native arteries of left leg with ulceration of other part of foot L97.512 Non-pressure chronic ulcer of other part of right foot with fat layer exposed L97.524 Non-pressure chronic ulcer of other part of left foot with necrosis of bone Follow-up Appointments Return Appointment in 2 weeks. Bathing/ Shower/ Hygiene May shower with protection but do not get wound dressing(s) wet. - when not changing dressings. May shower and wash wound with soap and water. - with dressing changes only. Edema Control - Lymphedema / SCD / Other Moisturize legs daily. - every night before bed. Off-Loading Open toe surgical shoe to: - patient to wear daily. no pressure from regular shoes. Wound Treatment Wound #1 -  T Great oe Wound Laterality: Right Cleanser: Soap and Water Every Other Day/30 Days Discharge Instructions: May shower and wash wound with dial antibacterial soap and water prior to dressing change. Prim Dressing: Endoform 2x2 in Every Other Day/30 Days ary Discharge Instructions: Moisten with [place] Jelly. Secondary Dressing: Woven Gauze Sponges 2x2 in (Generic) Every Other Day/30 Days Discharge Instructions: Apply over primary dressing as directed. Secured With: Child psychotherapist, Sterile 2x75 (in/in) (Generic) Every Other Day/30 Days Discharge Instructions: Secure with stretch gauze as directed. Secured With: 36M Medipore H Soft Cloth Surgical Tape, 2x2 (in/yd) (Generic) Every Other Day/30 Days Discharge Instructions: Secure dressing with tape as directed. Wound #2 - T Great oe Wound Laterality: Left Cleanser: Soap and Water Every Other Day/30 Days Discharge Instructions: May shower and wash wound with dial antibacterial soap and water prior to dressing change. Prim Dressing: Endoform 2x2 in Every Other Day/30 Days ary Discharge Instructions: Moisten with [place] Jelly. Secondary Dressing: Woven Gauze Sponges 2x2 in (Generic) Every Other Day/30 Days Discharge Instructions: Apply over primary dressing as directed. Secured With: Child psychotherapist, Sterile 2x75 (in/in) (Generic) Every Other Day/30 Days Discharge Instructions: Secure with stretch gauze as directed. Secured With: 36M Medipore H Soft Cloth Surgical Tape, 2x2 (in/yd) (Generic) Every Other Day/30 Days Discharge Instructions: Secure dressing with tape as directed. Electronic Signature(s) Signed: 11/23/2020 4:40:49 PM By: Linton Ham MD Signed: 11/23/2020 4:43:39 PM By: Deon Pilling Entered By: Deon Pilling on 11/23/2020 13:04:06 -------------------------------------------------------------------------------- Problem List Details Patient Name: Date of Service: Joshua Curtis 11/23/2020 12:30  PM Medical Record Number: [id number] Patient Account Number: [id number] Date of Birth/Sex: Treating RN: May 30, 1951 (70 y.o. [name] Primary Care Provider: Cathlean Cower Other Clinician: Referring Provider: Treating Provider/Extender: Neville Route  Weeks in Treatment: 5 Active Problems ICD-10 Encounter Code Description Active Date MDM Diagnosis I70.235 Atherosclerosis of native arteries of right leg with ulceration of other part of 10/19/2020 No Yes foot I70.245 Atherosclerosis of native arteries of left leg with ulceration of other part of 10/19/2020 No Yes foot L97.512 Non-pressure chronic ulcer of other part of right foot with fat layer exposed 10/19/2020 No Yes L97.524 Non-pressure chronic ulcer of other part of left foot with necrosis of bone 10/19/2020 No Yes Inactive Problems Resolved Problems Electronic Signature(s) Signed: 11/23/2020 4:40:49 PM By: Linton Ham MD Entered By: Linton Ham on 11/23/2020 13:04:44 -------------------------------------------------------------------------------- Progress Note Details Patient Name: Date of Service: Joshua Curtis 11/23/2020 12:30 PM Medical Record Number: [id number] Patient Account Number: [id number] Date of Birth/Sex: Treating RN: 27-Mar-1951 (70 y.o. [name] Primary Care Provider: Cathlean Cower Other Clinician: Referring Provider: Treating Provider/Extender: Jeri Modena in Treatment: 5 Subjective History of Present Illness (HPI) ADMISSION 10/19/2020 This is a 70 year old nondiabetic man developed wounds on his bilateral great toes in the summer 2021. Eventually referred to Dr. Earleen Newport who first noted these wounds I think in November. Started him on Santyl noted poor arterial exam and referred him to Dr. Trula Slade. On 07/04/2020 the patient had angiography had stents placed in the left SFA and popliteal and a failed angioplasty on the left PTA noted that he had single-vessel  runoff via the peroneal. On 07/18/2020 he had an angiogram on the right with angioplasty of the right tibial peroneal trunk and peroneal artery and a stent in the right popliteal and SFA. Since then he has been followed mostly by Dr. Earleen Newport using Annitta Needs and Prisma more recently Prisma. It was noted that the left first toe healed at some point but reopened earlier this month. The patient also saw Dr. Megan Salon with a history of osteomyelitis in both of these areas. I have not reviewed the x-rays. He received 1 month of Augmentin in December and I think that Augmentin was actually extended. His arterial evaluation prior to intervention showed an ABI on the right of 0.51 on the left 0.55. In December these were repeated with an ABI in the right of 0.89 on the left 0.91 much improved. The patient states that he was having clear claudication symptoms beforehand now he does not have any pain in his legs when he walks although his activity is still somewhat limited. We did not repeat his ABIs in the clinic Past medical history the patient is not a diabetic but he is a smoker stating he is trying to quit and smoked 2 cigarettes today. He has a history of hypertension, prostate CA currently trying to make a decision between surgery and radiation, pulmonary hypertension and a history of PAD 2/24; patient is using endoform. His wounds look better today. I did review his x-rays subtle changes of osteomyelitis. He was treated by Dr. Megan Salon with least a month of Augmentin possibly more. 11/09/2020 on evaluation today patient actually appears to be doing well with his wounds. Both are showing signs of improvement. The right great toe actually is fairly clean today on evaluation. The left toes probably can require some debridement. Neither wound shows any signs of infection we have been using endoform effectively up to this point. He has been managed due to some subtle changes with regard to osteomyelitis noted on  his x-rays by Dr. Megan Salon. He has been taking Augmentin. 3/24; both wounds are showing improvement today. Under illumination the surfaces look satisfactory  and the dimensions are smaller bilaterally. Objective Constitutional Sitting or standing Blood Pressure is within target range for patient.. Pulse regular and within target range for patient.Marland Kitchen Respirations regular, non-labored and within target range.. Temperature is normal and within the target range for the patient.Marland Kitchen Appears in no distress. Vitals Time Taken: 12:42 PM, Height: 70 in, Weight: 150 lbs, BMI: 21.5, Temperature: 98.7 F, Pulse: 93 bpm, Respiratory Rate: 18 breaths/min, Blood Pressure: 126/70 mmHg. Cardiovascular Dorsalis pedis pulses palpable but reduced. General Notes: Wound exam; small area on the left great toe at the tip of where the nailbed should be enough slightly larger area on the right. Both wound beds are in roughly the same condition may be not quite as vibrant as I would like to see however I did not debride these today. Integumentary (Hair, Skin) Wound #1 status is Open. Original cause of wound was Gradually Appeared. The date acquired was: 03/02/2020. The wound has been in treatment 5 weeks. The wound is located on the Right T Great. The wound measures 0.3cm length x 0.3cm width x 0.1cm depth; 0.071cm^2 area and 0.007cm^3 volume. There is Fat oe Layer (Subcutaneous Tissue) exposed. There is no tunneling or undermining noted. There is a medium amount of serosanguineous drainage noted. The wound margin is distinct with the outline attached to the wound base. There is large (67-100%) pink granulation within the wound bed. There is no necrotic tissue within the wound bed. Wound #2 status is Open. Original cause of wound was Gradually Appeared. The date acquired was: 03/02/2020. The wound has been in treatment 5 weeks. The wound is located on the Left T Great. The wound measures 0.2cm length x 0.2cm width x 0.1cm depth;  0.031cm^2 area and 0.003cm^3 volume. There is Fat oe Layer (Subcutaneous Tissue) exposed. There is no tunneling or undermining noted. There is a medium amount of serosanguineous drainage noted. The wound margin is flat and intact. There is large (67-100%) pink granulation within the wound bed. There is no necrotic tissue within the wound bed. Assessment Active Problems ICD-10 Atherosclerosis of native arteries of right leg with ulceration of other part of foot Atherosclerosis of native arteries of left leg with ulceration of other part of foot Non-pressure chronic ulcer of other part of right foot with fat layer exposed Non-pressure chronic ulcer of other part of left foot with necrosis of bone Plan Follow-up Appointments: Return Appointment in 2 weeks. Bathing/ Shower/ Hygiene: May shower with protection but do not get wound dressing(s) wet. - when not changing dressings. May shower and wash wound with soap and water. - with dressing changes only. Edema Control - Lymphedema / SCD / Other: Moisturize legs daily. - every night before bed. Off-Loading: Open toe surgical shoe to: - patient to wear daily. no pressure from regular shoes. WOUND #1: - T Great Wound Laterality: Right oe Cleanser: Soap and Water Every Other Day/30 Days Discharge Instructions: May shower and wash wound with dial antibacterial soap and water prior to dressing change. Prim Dressing: Endoform 2x2 in Every Other Day/30 Days ary Discharge Instructions: Moisten with [place] Jelly. Secondary Dressing: Woven Gauze Sponges 2x2 in (Generic) Every Other Day/30 Days Discharge Instructions: Apply over primary dressing as directed. Secured With: Child psychotherapist, Sterile 2x75 (in/in) (Generic) Every Other Day/30 Days Discharge Instructions: Secure with stretch gauze as directed. Secured With: 66M Medipore H Soft Cloth Surgical T ape, 2x2 (in/yd) (Generic) Every Other Day/30 Days Discharge Instructions: Secure  dressing with tape as directed. WOUND #2: -  T Great Wound Laterality: Left oe Cleanser: Soap and Water Every Other Day/30 Days Discharge Instructions: May shower and wash wound with dial antibacterial soap and water prior to dressing change. Prim Dressing: Endoform 2x2 in Every Other Day/30 Days ary Discharge Instructions: Moisten with [place] Jelly. Secondary Dressing: Woven Gauze Sponges 2x2 in (Generic) Every Other Day/30 Days Discharge Instructions: Apply over primary dressing as directed. Secured With: Child psychotherapist, Sterile 2x75 (in/in) (Generic) Every Other Day/30 Days Discharge Instructions: Secure with stretch gauze as directed. Secured With: 42M Medipore H Soft Cloth Surgical T ape, 2x2 (in/yd) (Generic) Every Other Day/30 Days Discharge Instructions: Secure dressing with tape as directed. 1. Continue with endoform bilaterally. The patient's family is changing the dressings 2. If the wound stalls he is likely to need debridement although I did not think it was severe enough to consider doing that today especially being both wounds improved in terms of surface area Electronic Signature(s) Signed: 11/23/2020 4:40:49 PM By: Linton Ham MD Entered By: Linton Ham on 11/23/2020 13:08:37 -------------------------------------------------------------------------------- SuperBill Details Patient Name: Date of Service: Joshua Curtis 11/23/2020 Medical Record Number: [id number] Patient Account Number: [id number] Date of Birth/Sex: Treating RN: 1951/01/24 (70 y.o. [name], [name] Primary Care Provider: Cathlean Cower Other Clinician: Referring Provider: Treating Provider/Extender: Jeri Modena in Treatment: 5 Diagnosis Coding ICD-10 Codes Code Description [phone] Atherosclerosis of native arteries of right leg with ulceration of other part of foot I70.245 Atherosclerosis of native arteries of left leg with ulceration of other part of  foot L97.512 Non-pressure chronic ulcer of other part of right foot with fat layer exposed L97.524 Non-pressure chronic ulcer of other part of left foot with necrosis of bone Facility Procedures CPT4 Code: [id number] Description: 99213 - WOUND CARE VISIT-LEV 3 EST PT Modifier: Quantity: 1 Physician Procedures : CPT4 Code Description Modifier [id number] - WC PHYS LEVEL 3 - EST PT ICD-10 Diagnosis Description L97.512 Non-pressure chronic ulcer of other part of right foot with fat layer exposed L97.524 Non-pressure chronic ulcer of other part of left foot  with necrosis of bone Quantity: 1 Electronic Signature(s) Signed: 11/23/2020 4:40:49 PM By: Linton Ham MD Signed: 11/23/2020 4:43:39 PM By: Deon Pilling Entered By: Deon Pilling on 11/23/2020 13:11:21

## 2020-11-24 ENCOUNTER — Other Ambulatory Visit: Payer: Self-pay | Admitting: Internal Medicine

## 2020-11-29 NOTE — Progress Notes (Signed)
Joshua, Curtis ([id number]) Visit Report for 11/23/2020 Arrival Information Details Patient Name: Date of Service: Joshua Curtis, Joshua Curtis 11/23/2020 12:30 PM Medical Record Number: [id number] Patient Account Number: [id number] Date of Birth/Sex: Treating RN: Apr 06, 1951 (70 y.o. Joshua Curtis Primary Care Joshua Curtis: Joshua Curtis Other Clinician: Referring Joshua Curtis: Treating Joshua Curtis/Extender: Joshua Curtis in Treatment: 5 Visit Information History Since Last Visit Added or deleted any medications: No Patient Arrived: Ambulatory Any new allergies or adverse reactions: No Arrival Time: 12:42 Had a fall or experienced change in No Transfer Assistance: None activities of daily living that may affect Patient Identification Verified: Yes risk of falls: Secondary Verification Process Completed: Yes Signs or symptoms of abuse/neglect since last visito No Patient Requires Transmission-Based Precautions: No Hospitalized since last visit: No Patient Has Alerts: Yes Implantable device outside of the clinic excluding No Patient Alerts: Patient on Blood Thinner cellular tissue based products placed in the center L ABI: 0.91 since last visit: R ABI: 0.89 (08/2020) Has Dressing in Place as Prescribed: Yes Pain Present Now: No Electronic Signature(s) Signed: 11/24/2020 6:16:52 PM By: Joshua Curtis Entered By: Joshua Curtis on 11/23/2020 12:42:57 -------------------------------------------------------------------------------- Clinic Level of Care Assessment Details Patient Name: Date of Service: Joshua, Curtis 11/23/2020 12:30 PM Medical Record Number: [id number] Patient Account Number: [id number] Date of Birth/Sex: Treating RN: Jan 03, 1951 (70 y.o. Joshua Curtis, [name] Primary Care Kalijah Westfall: Joshua Curtis Other Clinician: Referring Aveya Beal: Treating Alyza Artiaga/Extender: Joshua Curtis in Treatment: 5 Clinic Level of Care Assessment Items TOOL 4 Quantity  Score X- 1 0 Use when only an EandM is performed on FOLLOW-UP visit ASSESSMENTS - Nursing Assessment / Reassessment X- 1 10 Reassessment of Co-morbidities (includes updates in patient status) X- 1 5 Reassessment of Adherence to Treatment Plan ASSESSMENTS - Wound and Skin A ssessment / Reassessment []  - 0 Simple Wound Assessment / Reassessment - one wound X- 2 5 Complex Wound Assessment / Reassessment - multiple wounds X- 1 10 Dermatologic / Skin Assessment (not related to wound area) ASSESSMENTS - Focused Assessment X- 2 5 Circumferential Edema Measurements - multi extremities []  - 0 Nutritional Assessment / Counseling / Intervention []  - 0 Lower Extremity Assessment (monofilament, tuning fork, pulses) []  - 0 Peripheral Arterial Disease Assessment (using hand held doppler) ASSESSMENTS - Ostomy and/or Continence Assessment and Care []  - 0 Incontinence Assessment and Management []  - 0 Ostomy Care Assessment and Management (repouching, etc.) PROCESS - Coordination of Care []  - 0 Simple Patient / Family Education for ongoing care X- 1 20 Complex (extensive) Patient / Family Education for ongoing care X- 1 10 Staff obtains Programmer, systems, Records, T Results / Process Orders est []  - 0 Staff telephones HHA, Nursing Homes / Clarify orders / etc []  - 0 Routine Transfer to another Facility (non-emergent condition) []  - 0 Routine Hospital Admission (non-emergent condition) []  - 0 New Admissions / Biomedical engineer / Ordering NPWT Apligraf, etc. , []  - 0 Emergency Hospital Admission (emergent condition) []  - 0 Simple Discharge Coordination X- 1 15 Complex (extensive) Discharge Coordination PROCESS - Special Needs []  - 0 Pediatric / Minor Patient Management []  - 0 Isolation Patient Management []  - 0 Hearing / Language / Visual special needs []  - 0 Assessment of Community assistance (transportation, D/C planning, etc.) []  - 0 Additional assistance / Altered  mentation []  - 0 Support Surface(s) Assessment (bed, cushion, seat, etc.) INTERVENTIONS - Wound Cleansing / Measurement []  - 0 Simple Wound Cleansing - one wound X- 2 5 Complex Wound Cleansing -  multiple wounds X- 1 5 Wound Imaging (photographs - any number of wounds) []  - 0 Wound Tracing (instead of photographs) []  - 0 Simple Wound Measurement - one wound X- 2 5 Complex Wound Measurement - multiple wounds INTERVENTIONS - Wound Dressings X - Small Wound Dressing one or multiple wounds 2 10 []  - 0 Medium Wound Dressing one or multiple wounds []  - 0 Large Wound Dressing one or multiple wounds []  - 0 Application of Medications - topical []  - 0 Application of Medications - injection INTERVENTIONS - Miscellaneous []  - 0 External ear exam []  - 0 Specimen Collection (cultures, biopsies, blood, body fluids, etc.) []  - 0 Specimen(s) / Culture(s) sent or taken to Lab for analysis []  - 0 Patient Transfer (multiple staff / Civil Service fast streamer / Similar devices) []  - 0 Simple Staple / Suture removal (25 or less) []  - 0 Complex Staple / Suture removal (26 or more) []  - 0 Hypo / Hyperglycemic Management (close monitor of Blood Glucose) []  - 0 Ankle / Brachial Index (ABI) - do not check if billed separately X- 1 5 Vital Signs Has the patient been seen at the hospital within the last three years: Yes Total Score: 140 Level Of Care: New/Established - Level 4 Electronic Signature(s) Signed: 11/23/2020 4:43:39 PM By: Joshua Curtis Entered By: Joshua Curtis on 11/23/2020 13:11:15 -------------------------------------------------------------------------------- Encounter Discharge Information Details Patient Name: Date of Service: Joshua Curtis 11/23/2020 12:30 PM Medical Record Number: [id number] Patient Account Number: [id number] Date of Birth/Sex: Treating RN: 1951-08-20 (70 y.o. Joshua Curtis, Joshua Curtis Primary Care Joshua Curtis: Joshua Curtis Other Clinician: Referring Joshua Curtis: Treating  Joshua Curtis/Extender: Joshua Curtis in Treatment: 5 Encounter Discharge Information Items Discharge Condition: Stable Ambulatory Status: Ambulatory Discharge Destination: Home Transportation: Private Auto Accompanied By: self Schedule Follow-up Appointment: Yes Clinical Summary of Care: Patient Declined Electronic Signature(s) Signed: 11/23/2020 5:01:28 PM By: Rhae Hammock RN Entered By: Rhae Hammock on 11/23/2020 13:21:52 -------------------------------------------------------------------------------- Lower Extremity Assessment Details Patient Name: Date of Service: Joshua Curtis 11/23/2020 12:30 PM Medical Record Number: [id number] Patient Account Number: [id number] Date of Birth/Sex: Treating RN: 07/02/1951 (70 y.o. Joshua Curtis Primary Care Kolson Chovanec: Joshua Curtis Other Clinician: Referring Darcy Barbara: Treating Ameliya Nicotra/Extender: Joshua Curtis in Treatment: 5 Edema Assessment Assessed: Shirlyn Goltz: Yes] Patrice Paradise: Yes] Edema: [Left: No] [Right: No] Calf Left: Right: Point of Measurement: 33 cm From Medial Instep 30 cm 29 cm Ankle Left: Right: Point of Measurement: 11 cm From Medial Instep 20 cm 20 cm Vascular Assessment Pulses: Dorsalis Pedis Palpable: [Left:Yes] [Right:Yes] Electronic Signature(s) Signed: 11/24/2020 6:16:52 PM By: Joshua Curtis Entered By: Joshua Curtis on 11/23/2020 12:52:56 -------------------------------------------------------------------------------- Multi Wound Chart Details Patient Name: Date of Service: Joshua Curtis 11/23/2020 12:30 PM Medical Record Number: [id number] Patient Account Number: [id number] Date of Birth/Sex: Treating RN: Jan 19, 1951 (70 y.o. Joshua Curtis Primary Care Rayyan Burley: Joshua Curtis Other Clinician: Referring Gerardine Peltz: Treating Destini Cambre/Extender: Joshua Curtis in Treatment: 5 Vital Signs Height(in): 70 Pulse(bpm): 59 Weight(lbs): 150 Blood  Pressure(mmHg): 126/70 Body Mass Index(BMI): 22 Temperature(F): 98.7 Respiratory Rate(breaths/min): 18 Photos: [1:No Photos Right T Great oe] [2:No Photos Left T Great oe] [N/A:N/A N/A] Wound Location: [1:Gradually Appeared] [2:Gradually Appeared] [N/A:N/A] Wounding Event: [1:Arterial Insufficiency Ulcer] [2:Arterial Insufficiency Ulcer] [N/A:N/A] Primary Etiology: [1:Hypertension, Peripheral Arterial] [2:Hypertension, Peripheral Arterial] [N/A:N/A] Comorbid History: [1:Disease, Peripheral Venous Disease Disease, Peripheral Venous Disease 03/02/2020] [2:03/02/2020] [N/A:N/A] Date Acquired: [1:5] [2:5] [N/A:N/A] Weeks of Treatment: [1:Open] [2:Open] [N/A:N/A] Wound Status: [1:0.3x0.3x0.1] [2:0.2x0.2x0.1] [N/A:N/A] Measurements L x W x  D (cm) [1:0.071] [2:0.031] [N/A:N/A] A (cm) : rea [1:0.007] [2:0.003] [N/A:N/A] Volume (cm) : [1:74.90%] [2:75.40%] [N/A:N/A] % Reduction in Area: [1:75.00%] [2:88.00%] [N/A:N/A] % Reduction in Volume: [1:Full Thickness Without Exposed] [2:Full Thickness With Exposed Support N/A] Classification: [1:Support Structures Medium] [2:Structures Medium] [N/A:N/A] Exudate Amount: [1:Serosanguineous] [2:Serosanguineous] [N/A:N/A] Exudate Type: [1:red, brown] [2:red, brown] [N/A:N/A] Exudate Color: [1:Distinct, outline attached] [2:Flat and Intact] [N/A:N/A] Wound Margin: [1:Large (67-100%)] [2:Large (67-100%)] [N/A:N/A] Granulation Amount: [1:Pink] [2:Pink] [N/A:N/A] Granulation Quality: [1:None Present (0%)] [2:None Present (0%)] [N/A:N/A] Necrotic Amount: [1:Fat Layer (Subcutaneous Tissue): Yes Fat Layer (Subcutaneous Tissue): Yes N/A] Exposed Structures: [1:Fascia: No Tendon: No Muscle: No Joint: No Bone: No Medium (34-66%)] [2:Fascia: No Tendon: No Muscle: No Joint: No Bone: No Large (67-100%)] [N/A:N/A] Treatment Notes Electronic Signature(s) Signed: 11/23/2020 4:40:49 PM By: Linton Ham MD Signed: 11/23/2020 4:43:39 PM By: Joshua Curtis Entered By:  Linton Ham on 11/23/2020 13:05:44 -------------------------------------------------------------------------------- Multi-Disciplinary Care Plan Details Patient Name: Date of Service: Joshua Curtis 11/23/2020 12:30 PM Medical Record Number: [id number] Patient Account Number: [id number] Date of Birth/Sex: Treating RN: 01-29-1951 (70 y.o. Joshua Curtis, [name] Primary Care Jahara Dail: Joshua Curtis Other Clinician: Referring Ceejay Kegley: Treating Oshen Wlodarczyk/Extender: Joshua Curtis in Treatment: 5 Active Inactive Abuse / Safety / Falls / Self Care Management Nursing Diagnoses: Potential for falls Goals: Patient will remain injury free related to falls Date Initiated: 10/19/2020 Target Resolution Date: 12/15/2020 Goal Status: Active Interventions: Provide education on fall prevention Notes: Pain, Acute or Chronic Nursing Diagnoses: Pain, acute or chronic: actual or potential Potential alteration in comfort, pain Goals: Patient will verbalize adequate pain control and receive pain control interventions during procedures as needed Date Initiated: 10/19/2020 Target Resolution Date: 12/01/2020 Goal Status: Active Patient/caregiver will verbalize comfort level met Date Initiated: 10/19/2020 Target Resolution Date: 12/01/2020 Goal Status: Active Interventions: Encourage patient to take pain medications as prescribed Provide education on pain management Notes: Wound/Skin Impairment Nursing Diagnoses: Knowledge deficit related to ulceration/compromised skin integrity Goals: Patient/caregiver will verbalize understanding of skin care regimen Date Initiated: 10/19/2020 Target Resolution Date: 12/01/2020 Goal Status: Active Interventions: Assess patient/caregiver ability to obtain necessary supplies Assess patient/caregiver ability to perform ulcer/skin care regimen upon admission and as needed Assess ulceration(s) every visit Provide education on smoking Provide education  on ulcer and skin care Treatment Activities: Skin care regimen initiated : 10/19/2020 Topical wound management initiated : 10/19/2020 Notes: Electronic Signature(s) Signed: 11/23/2020 4:43:39 PM By: Joshua Curtis Entered By: Joshua Curtis on 11/23/2020 12:50:39 -------------------------------------------------------------------------------- Pain Assessment Details Patient Name: Date of Service: Joshua Curtis, Joshua Curtis 11/23/2020 12:30 PM Medical Record Number: [id number] Patient Account Number: [id number] Date of Birth/Sex: Treating RN: 1951-08-16 (70 y.o. Joshua Curtis Primary Care Alexandru Moorer: Joshua Curtis Other Clinician: Referring Rowin Bayron: Treating [place]/Extender: Joshua Curtis in Treatment: 5 Active Problems Location of Pain Severity and Description of Pain Patient Has Paino No Site Locations Pain Management and Medication Current Pain Management: Electronic Signature(s) Signed: 11/24/2020 6:16:52 PM By: Joshua Curtis Entered By: Joshua Curtis on 11/23/2020 12:45:51 -------------------------------------------------------------------------------- Patient/Caregiver Education Details Patient Name: Date of Service: Joshua Curtis 3/24/2022andnbsp12:30 PM Medical Record Number: [id number] Patient Account Number: [id number] Date of Birth/Gender: Treating RN: May 16, 1951 (70 y.o. Joshua Curtis Primary Care Physician: Joshua Curtis Other Clinician: Referring Physician: Treating Physician/Extender: Joshua Curtis in Treatment: 5 Education Assessment Education Provided To: Patient Education Topics Provided Wound/Skin Impairment: Handouts: Skin Care Do's and Dont's Methods: Explain/Verbal Responses: Reinforcements needed Electronic Signature(s) Signed: 11/23/2020 4:43:39 PM By: Joshua Curtis Entered  By: Joshua Curtis on 11/23/2020 12:50:49 -------------------------------------------------------------------------------- Wound  Assessment Details Patient Name: Date of Service: Joshua Curtis, Joshua Curtis 11/23/2020 12:30 PM Medical Record Number: [id number] Patient Account Number: [id number] Date of Birth/Sex: Treating RN: [birth year] (70 y.o. Joshua Curtis Primary Care Rosann Gorum: Joshua Curtis Other Clinician: Referring Tashya Alberty: Treating Charlsey Moragne/Extender: Joshua Curtis in Treatment: 5 Wound Status Wound Number: 1 Primary Arterial Insufficiency Ulcer Etiology: Wound Location: Right T Great oe Wound Status: Open Wounding Event: Gradually Appeared Comorbid Hypertension, Peripheral Arterial Disease, Peripheral Venous Date Acquired: 03/02/2020 History: Disease Weeks Of Treatment: 5 Clustered Wound: No Photos Wound Measurements Length: (cm) 0.3 Width: (cm) 0.3 Depth: (cm) 0.1 Area: (cm) 0.071 Volume: (cm) 0.007 % Reduction in Area: 74.9% % Reduction in Volume: 75% Epithelialization: Medium (34-66%) Tunneling: No Undermining: No Wound Description Classification: Full Thickness Without Exposed Support Structures Wound Margin: Distinct, outline attached Exudate Amount: Medium Exudate Type: Serosanguineous Exudate Color: red, brown Foul Odor After Cleansing: No Slough/Fibrino No Wound Bed Granulation Amount: Large (67-100%) Exposed Structure Granulation Quality: Pink Fascia Exposed: No Necrotic Amount: None Present (0%) Fat Layer (Subcutaneous Tissue) Exposed: Yes Tendon Exposed: No Muscle Exposed: No Joint Exposed: No Bone Exposed: No Treatment Notes Wound #1 (Toe Great) Wound Laterality: Right Cleanser Soap and Water Discharge Instruction: May shower and wash wound with dial antibacterial soap and water prior to dressing change. Peri-Wound Care Topical Primary Dressing Endoform 2x2 in Discharge Instruction: Moisten with [place] Jelly. Secondary Dressing Woven Gauze Sponges 2x2 in Discharge Instruction: Apply over primary dressing as directed. Secured With Conforming  Stretch Gauze Bandage, Sterile 2x75 (in/in) Discharge Instruction: Secure with stretch gauze as directed. 2M Medipore H Soft Cloth Surgical T ape, 2x2 (in/yd) Discharge Instruction: Secure dressing with tape as directed. Compression Wrap Compression Stockings Add-Ons Electronic Signature(s) Signed: 11/24/2020 6:16:52 PM By: Joshua Curtis Signed: 11/29/2020 9:05:34 AM By: Sandre Kitty Entered By: Sandre Kitty on 11/23/2020 16:30:11 -------------------------------------------------------------------------------- Wound Assessment Details Patient Name: Date of Service: Joshua Curtis 11/23/2020 12:30 PM Medical Record Number: [id number] Patient Account Number: [id number] Date of Birth/Sex: Treating RN: 05-02-1951 (70 y.o. Joshua Curtis Primary Care Jamieka Royle: Joshua Curtis Other Clinician: Referring Ashelyn Mccravy: Treating Adynn Caseres/Extender: Joshua Curtis in Treatment: 5 Wound Status Wound Number: 2 Primary Arterial Insufficiency Ulcer Etiology: Wound Location: Left T Great oe Wound Status: Open Wounding Event: Gradually Appeared Comorbid Hypertension, Peripheral Arterial Disease, Peripheral Venous Date Acquired: 03/02/2020 History: Disease Weeks Of Treatment: 5 Clustered Wound: No Photos Wound Measurements Length: (cm) 0.2 Width: (cm) 0.2 Depth: (cm) 0.1 Area: (cm) 0.031 Volume: (cm) 0.003 % Reduction in Area: 75.4% % Reduction in Volume: 88% Epithelialization: Large (67-100%) Tunneling: No Undermining: No Wound Description Classification: Full Thickness With Exposed Support Structures Wound Margin: Flat and Intact Exudate Amount: Medium Exudate Type: Serosanguineous Exudate Color: red, brown Foul Odor After Cleansing: No Slough/Fibrino No Wound Bed Granulation Amount: Large (67-100%) Exposed Structure Granulation Quality: Pink Fascia Exposed: No Necrotic Amount: None Present (0%) Fat Layer (Subcutaneous Tissue) Exposed: Yes Tendon  Exposed: No Muscle Exposed: No Joint Exposed: No Bone Exposed: No Treatment Notes Wound #2 (Toe Great) Wound Laterality: Left Cleanser Soap and Water Discharge Instruction: May shower and wash wound with dial antibacterial soap and water prior to dressing change. Peri-Wound Care Topical Primary Dressing Endoform 2x2 in Discharge Instruction: Moisten with [place] Jelly. Secondary Dressing Woven Gauze Sponges 2x2 in Discharge Instruction: Apply over primary dressing as directed. Secured With Conforming Stretch Gauze Bandage, Sterile 2x75 (in/in) Discharge Instruction: Secure with stretch  gauze as directed. 31M Medipore H Soft Cloth Surgical T ape, 2x2 (in/yd) Discharge Instruction: Secure dressing with tape as directed. Compression Wrap Compression Stockings Add-Ons Electronic Signature(s) Signed: 11/24/2020 6:16:52 PM By: Joshua Curtis Signed: 11/29/2020 9:05:34 AM By: Sandre Kitty Entered By: Sandre Kitty on 11/23/2020 16:30:28 -------------------------------------------------------------------------------- Vitals Details Patient Name: Date of Service: Joshua Curtis 11/23/2020 12:30 PM Medical Record Number: [id number] Patient Account Number: [id number] Date of Birth/Sex: Treating RN: 1951/07/17 (70 y.o. Joshua Curtis Primary Care Morgane Joerger: Joshua Curtis Other Clinician: Referring Ripley Bogosian: Treating Marissa Weaver/Extender: Joshua Curtis in Treatment: 5 Vital Signs Time Taken: 12:42 Temperature (F): 98.7 Height (in): 70 Pulse (bpm): 93 Weight (lbs): 150 Respiratory Rate (breaths/min): 18 Body Mass Index (BMI): 21.5 Blood Pressure (mmHg): 126/70 Reference Range: 80 - 120 mg / dl Electronic Signature(s) Signed: 11/24/2020 6:16:52 PM By: Joshua Curtis Entered By: Joshua Curtis on 11/23/2020 12:45:33

## 2020-12-06 ENCOUNTER — Other Ambulatory Visit: Payer: Self-pay | Admitting: Urology

## 2020-12-07 ENCOUNTER — Encounter (HOSPITAL_BASED_OUTPATIENT_CLINIC_OR_DEPARTMENT_OTHER): Payer: BC Managed Care – PPO | Attending: Internal Medicine | Admitting: Internal Medicine

## 2020-12-07 ENCOUNTER — Other Ambulatory Visit: Payer: Self-pay

## 2020-12-07 DIAGNOSIS — I70235 Atherosclerosis of native arteries of right leg with ulceration of other part of foot: Secondary | ICD-10-CM | POA: Diagnosis not present

## 2020-12-07 DIAGNOSIS — Z95820 Peripheral vascular angioplasty status with implants and grafts: Secondary | ICD-10-CM | POA: Diagnosis not present

## 2020-12-07 DIAGNOSIS — F17210 Nicotine dependence, cigarettes, uncomplicated: Secondary | ICD-10-CM | POA: Insufficient documentation

## 2020-12-07 DIAGNOSIS — L97512 Non-pressure chronic ulcer of other part of right foot with fat layer exposed: Secondary | ICD-10-CM | POA: Diagnosis not present

## 2020-12-07 DIAGNOSIS — I70245 Atherosclerosis of native arteries of left leg with ulceration of other part of foot: Secondary | ICD-10-CM | POA: Insufficient documentation

## 2020-12-07 DIAGNOSIS — L97524 Non-pressure chronic ulcer of other part of left foot with necrosis of bone: Secondary | ICD-10-CM | POA: Diagnosis not present

## 2020-12-07 DIAGNOSIS — L97519 Non-pressure chronic ulcer of other part of right foot with unspecified severity: Secondary | ICD-10-CM | POA: Diagnosis not present

## 2020-12-07 DIAGNOSIS — C61 Malignant neoplasm of prostate: Secondary | ICD-10-CM | POA: Insufficient documentation

## 2020-12-08 NOTE — Progress Notes (Signed)
Joshua, Curtis ([id number]) Visit Report for 12/07/2020 HPI Details Patient Name: Date of Service: Joshua Curtis, Joshua Curtis 12/07/2020 12:30 PM Medical Record Number: [id number] Patient Account Number: [id number] Date of Birth/Sex: Treating RN: 14-May-1951 (70 y.o. [name] Primary Care Provider: Cathlean Cower Other Clinician: Referring Provider: Treating Provider/Extender: Jeri Modena in Treatment: 7 History of Present Illness HPI Description: ADMISSION 10/19/2020 This is a 70 year old nondiabetic man developed wounds on his bilateral great toes in the summer 2021. Eventually referred to Dr. Earleen Newport who first noted these wounds I think in November. Started him on Santyl noted poor arterial exam and referred him to Dr. Trula Slade. On 07/04/2020 the patient had angiography had stents placed in the left SFA and popliteal and a failed angioplasty on the left PTA noted that he had single-vessel runoff via the peroneal. On 07/18/2020 he had an angiogram on the right with angioplasty of the right tibial peroneal trunk and peroneal artery and a stent in the right popliteal and SFA. Since then he has been followed mostly by Dr. Earleen Newport using Annitta Needs and Prisma more recently Prisma. It was noted that the left first toe healed at some point but reopened earlier this month. The patient also saw Dr. Megan Salon with a history of osteomyelitis in both of these areas. I have not reviewed the x-rays. He received 1 month of Augmentin in December and I think that Augmentin was actually extended. His arterial evaluation prior to intervention showed an ABI on the right of 0.51 on the left 0.55. In December these were repeated with an ABI in the right of 0.89 on the left 0.91 much improved. The patient states that he was having clear claudication symptoms beforehand now he does not have any pain in his legs when he walks although his activity is still somewhat limited. We did not repeat his ABIs in the  clinic Past medical history the patient is not a diabetic but he is a smoker stating he is trying to quit and smoked 2 cigarettes today. He has a history of hypertension, prostate CA currently trying to make a decision between surgery and radiation, pulmonary hypertension and a history of PAD 2/24; patient is using endoform. His wounds look better today. I did review his x-rays subtle changes of osteomyelitis. He was treated by Dr. Megan Salon with least a month of Augmentin possibly more. 11/09/2020 on evaluation today patient actually appears to be doing well with his wounds. Both are showing signs of improvement. The right great toe actually is fairly clean today on evaluation. The left toes probably can require some debridement. Neither wound shows any signs of infection we have been using endoform effectively up to this point. He has been managed due to some subtle changes with regard to osteomyelitis noted on his x-rays by Dr. Megan Salon. He has been taking Augmentin. 3/24; both wounds are showing improvement today. Under illumination the surfaces look satisfactory and the dimensions are smaller bilaterally. 4/7; 2-week follow-up. Still continuing to improve the left toe wound is healed he still has an area on the tip of his right great toe right at the top of the nail edge. But this looks better as well. He is wearing surgical shoes. Electronic Signature(s) Signed: 12/07/2020 5:22:04 PM By: Linton Ham MD Entered By: Linton Ham on 12/07/2020 13:29:04 -------------------------------------------------------------------------------- Physical Exam Details Patient Name: Date of Service: Joshua Curtis 12/07/2020 12:30 PM Medical Record Number: [id number] Patient Account Number: [id number] Date of Birth/Sex: Treating RN: [birth year] (70 y.o. M)  Deon Pilling Primary Care Provider: Cathlean Cower Other Clinician: Referring Provider: Treating Provider/Extender: Jeri Modena  in Treatment: 7 Constitutional Sitting or standing Blood Pressure is within target range for patient.. Pulse regular and within target range for patient.Marland Kitchen Respirations regular, non-labored and within target range.. Temperature is normal and within the target range for the patient.Marland Kitchen Appears in no distress. Cardiovascular Palpable at the dorsalis pedis but faintly. Notes Wound exam; the area on the left great toe is healed the area on the right is still open but much better than last time. There is no evidence of surrounding infection no palpable bone Electronic Signature(s) Signed: 12/07/2020 5:22:04 PM By: Linton Ham MD Entered By: Linton Ham on 12/07/2020 13:30:02 -------------------------------------------------------------------------------- Physician Orders Details Patient Name: Date of Service: Joshua Curtis 12/07/2020 12:30 PM Medical Record Number: [id number] Patient Account Number: [id number] Date of Birth/Sex: Treating RN: 04/11/1951 (70 y.o. [name], [name] Primary Care Provider: Cathlean Cower Other Clinician: Referring Provider: Treating Provider/Extender: Jeri Modena in Treatment: 7 Verbal / Phone Orders: No Diagnosis Coding ICD-10 Coding Code Description [phone] Atherosclerosis of native arteries of right leg with ulceration of other part of foot I70.245 Atherosclerosis of native arteries of left leg with ulceration of other part of foot L97.512 Non-pressure chronic ulcer of other part of right foot with fat layer exposed L97.524 Non-pressure chronic ulcer of other part of left foot with necrosis of bone Follow-up Appointments Return Appointment in 2 weeks. Bathing/ Shower/ Hygiene May shower with protection but do not get wound dressing(s) wet. - when not changing dressings. May shower and wash wound with soap and water. - with dressing changes only. Edema Control - Lymphedema / SCD / Other Moisturize legs daily. - every night before  bed. Off-Loading Open toe surgical shoe to: - patient to wear daily to right foot. Additional Orders / Instructions Other: - Pad left toe for protection x2 weeks. Patient to ensure no rubbing in shoes to tips of toe. May wear a regular shoe to left foot. Wound Treatment Wound #1 - T Great oe Wound Laterality: Right Cleanser: Soap and Water Every Other Day/30 Days Discharge Instructions: May shower and wash wound with dial antibacterial soap and water prior to dressing change. Prim Dressing: Endoform 2x2 in Every Other Day/30 Days ary Discharge Instructions: Moisten with [place] Jelly. Secondary Dressing: Woven Gauze Sponges 2x2 in (Generic) Every Other Day/30 Days Discharge Instructions: Apply over primary dressing as directed. Secured With: Child psychotherapist, Sterile 2x75 (in/in) (Generic) Every Other Day/30 Days Discharge Instructions: Secure with stretch gauze as directed. Secured With: 33M Medipore H Soft Cloth Surgical Tape, 2x2 (in/yd) (Generic) Every Other Day/30 Days Discharge Instructions: Secure dressing with tape as directed. Electronic Signature(s) Signed: 12/07/2020 5:22:04 PM By: Linton Ham MD Signed: 12/08/2020 4:53:51 PM By: Deon Pilling Entered By: Deon Pilling on 12/07/2020 13:26:12 -------------------------------------------------------------------------------- Problem List Details Patient Name: Date of Service: Joshua Curtis 12/07/2020 12:30 PM Medical Record Number: [id number] Patient Account Number: [id number] Date of Birth/Sex: Treating RN: 1950-11-08 (70 y.o. [name] Primary Care Provider: Cathlean Cower Other Clinician: Referring Provider: Treating Provider/Extender: Jeri Modena in Treatment: 7 Active Problems ICD-10 Encounter Code Description Active Date MDM Diagnosis I70.235 Atherosclerosis of native arteries of right leg with ulceration of other part of 10/19/2020 No Yes foot I70.245 Atherosclerosis of  native arteries of left leg with ulceration of other part of 10/19/2020 No Yes foot L97.512 Non-pressure chronic ulcer of other part of  right foot with fat layer exposed 10/19/2020 No Yes L97.524 Non-pressure chronic ulcer of other part of left foot with necrosis of bone 10/19/2020 No Yes Inactive Problems Resolved Problems Electronic Signature(s) Signed: 12/07/2020 5:22:04 PM By: Linton Ham MD Entered By: Linton Ham on 12/07/2020 13:28:25 -------------------------------------------------------------------------------- Progress Note Details Patient Name: Date of Service: Joshua Curtis 12/07/2020 12:30 PM Medical Record Number: [id number] Patient Account Number: [id number] Date of Birth/Sex: Treating RN: 10-Jun-1951 (70 y.o. [name] Primary Care Provider: Cathlean Cower Other Clinician: Referring Provider: Treating Provider/Extender: Jeri Modena in Treatment: 7 Subjective History of Present Illness (HPI) ADMISSION 10/19/2020 This is a 70 year old nondiabetic man developed wounds on his bilateral great toes in the summer 2021. Eventually referred to Dr. Earleen Newport who first noted these wounds I think in November. Started him on Santyl noted poor arterial exam and referred him to Dr. Trula Slade. On 07/04/2020 the patient had angiography had stents placed in the left SFA and popliteal and a failed angioplasty on the left PTA noted that he had single-vessel runoff via the peroneal. On 07/18/2020 he had an angiogram on the right with angioplasty of the right tibial peroneal trunk and peroneal artery and a stent in the right popliteal and SFA. Since then he has been followed mostly by Dr. Earleen Newport using Annitta Needs and Prisma more recently Prisma. It was noted that the left first toe healed at some point but reopened earlier this month. The patient also saw Dr. Megan Salon with a history of osteomyelitis in both of these areas. I have not reviewed the x-rays. He received 1  month of Augmentin in December and I think that Augmentin was actually extended. His arterial evaluation prior to intervention showed an ABI on the right of 0.51 on the left 0.55. In December these were repeated with an ABI in the right of 0.89 on the left 0.91 much improved. The patient states that he was having clear claudication symptoms beforehand now he does not have any pain in his legs when he walks although his activity is still somewhat limited. We did not repeat his ABIs in the clinic Past medical history the patient is not a diabetic but he is a smoker stating he is trying to quit and smoked 2 cigarettes today. He has a history of hypertension, prostate CA currently trying to make a decision between surgery and radiation, pulmonary hypertension and a history of PAD 2/24; patient is using endoform. His wounds look better today. I did review his x-rays subtle changes of osteomyelitis. He was treated by Dr. Megan Salon with least a month of Augmentin possibly more. 11/09/2020 on evaluation today patient actually appears to be doing well with his wounds. Both are showing signs of improvement. The right great toe actually is fairly clean today on evaluation. The left toes probably can require some debridement. Neither wound shows any signs of infection we have been using endoform effectively up to this point. He has been managed due to some subtle changes with regard to osteomyelitis noted on his x-rays by Dr. Megan Salon. He has been taking Augmentin. 3/24; both wounds are showing improvement today. Under illumination the surfaces look satisfactory and the dimensions are smaller bilaterally. 4/7; 2-week follow-up. Still continuing to improve the left toe wound is healed he still has an area on the tip of his right great toe right at the top of the nail edge. But this looks better as well. He is wearing surgical shoes. Objective Constitutional Sitting or standing Blood Pressure is  within target  range for patient.. Pulse regular and within target range for patient.Marland Kitchen Respirations regular, non-labored and within target range.. Temperature is normal and within the target range for the patient.Marland Kitchen Appears in no distress. Vitals Time Taken: 12:57 PM, Height: 70 in, Source: Stated, Weight: 150 lbs, Source: Stated, BMI: 21.5, Temperature: 98.8 F, Pulse: 94 bpm, Respiratory Rate: 18 breaths/min, Blood Pressure: 124/67 mmHg. Cardiovascular Palpable at the dorsalis pedis but faintly. General Notes: Wound exam; the area on the left great toe is healed the area on the right is still open but much better than last time. There is no evidence of surrounding infection no palpable bone Integumentary (Hair, Skin) Wound #1 status is Open. Original cause of wound was Gradually Appeared. The date acquired was: 03/02/2020. The wound has been in treatment 7 weeks. The wound is located on the Right T Great. The wound measures 0.2cm length x 0.2cm width x 0.1cm depth; 0.031cm^2 area and 0.003cm^3 volume. There is Fat oe Layer (Subcutaneous Tissue) exposed. There is no tunneling or undermining noted. There is a small amount of serosanguineous drainage noted. The wound margin is distinct with the outline attached to the wound base. There is large (67-100%) pink granulation within the wound bed. There is no necrotic tissue within the wound bed. Wound #2 status is Open. Original cause of wound was Gradually Appeared. The date acquired was: 03/02/2020. The wound has been in treatment 7 weeks. The wound is located on the Left T Great. The wound measures 0cm length x 0cm width x 0cm depth; 0cm^2 area and 0cm^3 volume. There is no tunneling or oe undermining noted. There is a none present amount of drainage noted. There is no granulation within the wound bed. There is no necrotic tissue within the wound bed. Assessment Active Problems ICD-10 Atherosclerosis of native arteries of right leg with ulceration of other part of  foot Atherosclerosis of native arteries of left leg with ulceration of other part of foot Non-pressure chronic ulcer of other part of right foot with fat layer exposed Non-pressure chronic ulcer of other part of left foot with necrosis of bone Plan Follow-up Appointments: Return Appointment in 2 weeks. Bathing/ Shower/ Hygiene: May shower with protection but do not get wound dressing(s) wet. - when not changing dressings. May shower and wash wound with soap and water. - with dressing changes only. Edema Control - Lymphedema / SCD / Other: Moisturize legs daily. - every night before bed. Off-Loading: Open toe surgical shoe to: - patient to wear daily to right foot. Additional Orders / Instructions: Other: - Pad left toe for protection x2 weeks. Patient to ensure no rubbing in shoes to tips of toe. May wear a regular shoe to left foot. WOUND #1: - T Great Wound Laterality: Right oe Cleanser: Soap and Water Every Other Day/30 Days Discharge Instructions: May shower and wash wound with dial antibacterial soap and water prior to dressing change. Prim Dressing: Endoform 2x2 in Every Other Day/30 Days ary Discharge Instructions: Moisten with [place] Jelly. Secondary Dressing: Woven Gauze Sponges 2x2 in (Generic) Every Other Day/30 Days Discharge Instructions: Apply over primary dressing as directed. Secured With: Child psychotherapist, Sterile 2x75 (in/in) (Generic) Every Other Day/30 Days Discharge Instructions: Secure with stretch gauze as directed. Secured With: 19M Medipore H Soft Cloth Surgical T ape, 2x2 (in/yd) (Generic) Every Other Day/30 Days Discharge Instructions: Secure dressing with tape as directed. #1 I am continue with the endoform on the right great toe 2. I have advised  a shoe with a lot of length to give the toe room. Advised to keep the left toe gauze protected. Perhaps a running shoe at one size greater than is normal shoe size. 3. Follow-up in 2 weeks hopefully the  area on the right heel that is well 4. Follows with vein and vascular with regards to his revascularization done previously Electronic Signature(s) Signed: 12/07/2020 5:22:04 PM By: Linton Ham MD Entered By: Linton Ham on 12/07/2020 13:31:21 -------------------------------------------------------------------------------- SuperBill Details Patient Name: Date of Service: Joshua Curtis 12/07/2020 Medical Record Number: [id number] Patient Account Number: [id number] Date of Birth/Sex: Treating RN: 07-Jan-1951 (70 y.o. [name], [name] Primary Care Provider: Cathlean Cower Other Clinician: Referring Provider: Treating Provider/Extender: Jeri Modena in Treatment: 7 Diagnosis Coding ICD-10 Codes Code Description [phone] Atherosclerosis of native arteries of right leg with ulceration of other part of foot I70.245 Atherosclerosis of native arteries of left leg with ulceration of other part of foot L97.512 Non-pressure chronic ulcer of other part of right foot with fat layer exposed L97.524 Non-pressure chronic ulcer of other part of left foot with necrosis of bone Facility Procedures CPT4 Code: [id number] Description: 43606 - WOUND CARE VISIT-LEV 4 EST PT Modifier: Quantity: 1 Physician Procedures Electronic Signature(s) Signed: 12/07/2020 5:22:04 PM By: Linton Ham MD Entered By: Linton Ham on 12/07/2020 13:31:39

## 2020-12-08 NOTE — Progress Notes (Signed)
Joshua Curtis, ALLENDE ([id number]) Visit Report for 12/07/2020 Arrival Information Details Patient Name: Date of Service: EVEREST, BROD 12/07/2020 12:30 PM Medical Record Number: [id number] Patient Account Number: [id number] Date of Birth/Sex: Treating RN: 1951-07-10 (70 y.o. Ernestene Mention Primary Care Aundreya Souffrant: Cathlean Cower Other Clinician: Referring Braleigh Massoud: Treating Javelle Donigan/Extender: Jeri Modena in Treatment: 7 Visit Information History Since Last Visit Added or deleted any medications: No Patient Arrived: Ambulatory Any new allergies or adverse reactions: No Arrival Time: 12:53 Had a fall or experienced change in No Accompanied By: self activities of daily living that may affect Transfer Assistance: None risk of falls: Patient Identification Verified: Yes Signs or symptoms of abuse/neglect since No Secondary Verification Process Completed: Yes last visito Patient Requires Transmission-Based Precautions: No Hospitalized since last visit: No Patient Has Alerts: Yes Implantable device outside of the clinic No Patient Alerts: Patient on Blood Thinner excluding L ABI: 0.91 cellular tissue based products placed in the R ABI: 0.89 (08/2020) center since last visit: Has Dressing in Place as Prescribed: Yes Has Footwear/Offloading in Place as Yes Prescribed: Left: Surgical Shoe with Pressure Relief Insole Right: Surgical Shoe with Pressure Relief Insole Pain Present Now: No Electronic Signature(s) Signed: 12/07/2020 5:18:58 PM By: Baruch Gouty RN, BSN Entered By: Baruch Gouty on 12/07/2020 12:57:05 -------------------------------------------------------------------------------- Clinic Level of Care Assessment Details Patient Name: Date of Service: Joshua Curtis, ASCH 12/07/2020 12:30 PM Medical Record Number: [id number] Patient Account Number: [id number] Date of Birth/Sex: Treating RN: 1950-09-27 (70 y.o. [name], [name] Primary Care Taylan Marez: Cathlean Cower Other Clinician: Referring Tameia Rafferty: Treating Melquan Ernsberger/Extender: Jeri Modena in Treatment: 7 Clinic Level of Care Assessment Items TOOL 4 Quantity Score X- 1 0 Use when only an EandM is performed on FOLLOW-UP visit ASSESSMENTS - Nursing Assessment / Reassessment X- 1 10 Reassessment of Co-morbidities (includes updates in patient status) X- 1 5 Reassessment of Adherence to Treatment Plan ASSESSMENTS - Wound and Skin A ssessment / Reassessment []  - 0 Simple Wound Assessment / Reassessment - one wound X- 2 5 Complex Wound Assessment / Reassessment - multiple wounds X- 1 10 Dermatologic / Skin Assessment (not related to wound area) ASSESSMENTS - Focused Assessment X- 2 5 Circumferential Edema Measurements - multi extremities X- 1 10 Nutritional Assessment / Counseling / Intervention []  - 0 Lower Extremity Assessment (monofilament, tuning fork, pulses) []  - 0 Peripheral Arterial Disease Assessment (using hand held doppler) ASSESSMENTS - Ostomy and/or Continence Assessment and Care []  - 0 Incontinence Assessment and Management []  - 0 Ostomy Care Assessment and Management (repouching, etc.) PROCESS - Coordination of Care []  - 0 Simple Patient / Family Education for ongoing care X- 1 20 Complex (extensive) Patient / Family Education for ongoing care X- 1 10 Staff obtains Programmer, systems, Records, T Results / Process Orders est []  - 0 Staff telephones HHA, Nursing Homes / Clarify orders / etc []  - 0 Routine Transfer to another Facility (non-emergent condition) []  - 0 Routine Hospital Admission (non-emergent condition) []  - 0 New Admissions / Biomedical engineer / Ordering NPWT Apligraf, etc. , []  - 0 Emergency Hospital Admission (emergent condition) []  - 0 Simple Discharge Coordination X- 1 15 Complex (extensive) Discharge Coordination PROCESS - Special Needs []  - 0 Pediatric / Minor Patient Management []  - 0 Isolation Patient  Management []  - 0 Hearing / Language / Visual special needs []  - 0 Assessment of Community assistance (transportation, D/C planning, etc.) []  - 0 Additional assistance / Altered mentation []  - 0 Support Surface(s)  Assessment (bed, cushion, seat, etc.) INTERVENTIONS - Wound Cleansing / Measurement []  - 0 Simple Wound Cleansing - one wound X- 2 5 Complex Wound Cleansing - multiple wounds X- 1 5 Wound Imaging (photographs - any number of wounds) []  - 0 Wound Tracing (instead of photographs) []  - 0 Simple Wound Measurement - one wound X- 2 5 Complex Wound Measurement - multiple wounds INTERVENTIONS - Wound Dressings X - Small Wound Dressing one or multiple wounds 2 10 []  - 0 Medium Wound Dressing one or multiple wounds []  - 0 Large Wound Dressing one or multiple wounds []  - 0 Application of Medications - topical []  - 0 Application of Medications - injection INTERVENTIONS - Miscellaneous []  - 0 External ear exam []  - 0 Specimen Collection (cultures, biopsies, blood, body fluids, etc.) []  - 0 Specimen(s) / Culture(s) sent or taken to Lab for analysis []  - 0 Patient Transfer (multiple staff / Civil Service fast streamer / Similar devices) []  - 0 Simple Staple / Suture removal (25 or less) []  - 0 Complex Staple / Suture removal (26 or more) []  - 0 Hypo / Hyperglycemic Management (close monitor of Blood Glucose) []  - 0 Ankle / Brachial Index (ABI) - do not check if billed separately X- 1 5 Vital Signs Has the patient been seen at the hospital within the last three years: Yes Total Score: 150 Level Of Care: New/Established - Level 4 Electronic Signature(s) Signed: 12/08/2020 4:53:51 PM By: Deon Pilling Entered By: Deon Pilling on 12/07/2020 13:27:03 -------------------------------------------------------------------------------- Encounter Discharge Information Details Patient Name: Date of Service: Joshua Curtis Cooper 12/07/2020 12:30 PM Medical Record Number: [id number] Patient  Account Number: [id number] Date of Birth/Sex: Treating RN: [birth year] (70 y.o. Ernestene Mention Primary Care Devann Cribb: Cathlean Cower Other Clinician: Referring Janiylah Hannis: Treating Billye Nydam/Extender: Jeri Modena in Treatment: 7 Encounter Discharge Information Items Discharge Condition: Stable Ambulatory Status: Ambulatory Discharge Destination: Home Transportation: Private Auto Accompanied By: self Schedule Follow-up Appointment: Yes Clinical Summary of Care: Patient Declined Electronic Signature(s) Signed: 12/07/2020 5:18:58 PM By: Baruch Gouty RN, BSN Entered By: Baruch Gouty on 12/07/2020 13:39:38 -------------------------------------------------------------------------------- Lower Extremity Assessment Details Patient Name: Date of Service: Joshua Curtis Cooper 12/07/2020 12:30 PM Medical Record Number: [id number] Patient Account Number: [id number] Date of Birth/Sex: Treating RN: 1950/09/16 (70 y.o. Ernestene Mention Primary Care Amairany Schumpert: Cathlean Cower Other Clinician: Referring Jiselle Sheu: Treating Aljean Horiuchi/Extender: Jeri Modena in Treatment: 7 Edema Assessment Assessed: Shirlyn Goltz: No] [Right: No] Edema: [Left: No] [Right: No] Calf Left: Right: Point of Measurement: 33 cm From Medial Instep 30 cm 29 cm Ankle Left: Right: Point of Measurement: 11 cm From Medial Instep 20 cm 20 cm Vascular Assessment Pulses: Dorsalis Pedis Palpable: [Left:Yes] [Right:Yes] Electronic Signature(s) Signed: 12/07/2020 5:18:58 PM By: Baruch Gouty RN, BSN Entered By: Baruch Gouty on 12/07/2020 13:02:09 -------------------------------------------------------------------------------- Multi Wound Chart Details Patient Name: Date of Service: Joshua Curtis Cooper 12/07/2020 12:30 PM Medical Record Number: [id number] Patient Account Number: [id number] Date of Birth/Sex: Treating RN: 1951-04-09 (70 y.o. [name] Primary Care Shania Bjelland: Cathlean Cower Other Clinician: Referring Ramell Wacha: Treating Samella Lucchetti/Extender: Jeri Modena in Treatment: 7 Vital Signs Height(in): 70 Pulse(bpm): 32 Weight(lbs): 150 Blood Pressure(mmHg): 124/67 Body Mass Index(BMI): 22 Temperature(F): 98.8 Respiratory Rate(breaths/min): 18 Photos: [1:No Photos Right T Great oe] [2:No Photos Left T Great oe] [N/A:N/A N/A] Wound Location: [1:Gradually Appeared] [2:Gradually Appeared] [N/A:N/A] Wounding Event: [1:Arterial Insufficiency Ulcer] [2:Arterial Insufficiency Ulcer] [N/A:N/A] Primary Etiology: [1:Hypertension, Peripheral Arterial] [2:Hypertension, Peripheral Arterial] [N/A:N/A] Comorbid History: [1:Disease, Peripheral  Venous Disease Disease, Peripheral Venous Disease 03/02/2020] [2:03/02/2020] [N/A:N/A] Date Acquired: [1:7] [2:7] [N/A:N/A] Weeks of Treatment: [1:Open] [2:Open] [N/A:N/A] Wound Status: [1:0.2x0.2x0.1] [2:0x0x0] [N/A:N/A] Measurements L x W x D (cm) [1:0.031] [2:0] [N/A:N/A] A (cm) : rea [1:0.003] [2:0] [N/A:N/A] Volume (cm) : [1:89.00%] [2:100.00%] [N/A:N/A] % Reduction in Area: [1:89.30%] [2:100.00%] [N/A:N/A] % Reduction in Volume: [1:Full Thickness Without Exposed] [2:Full Thickness With Exposed Support] [N/A:N/A] Classification: [1:Support Structures Small] [2:Structures None Present] [N/A:N/A] Exudate Amount: [1:Serosanguineous] [2:N/A] [N/A:N/A] Exudate Type: [1:red, brown] [2:N/A] [N/A:N/A] Exudate Color: [1:Distinct, outline attached] [2:N/A] [N/A:N/A] Wound Margin: [1:Large (67-100%)] [2:None Present (0%)] [N/A:N/A] Granulation Amount: [1:Pink] [2:N/A] [N/A:N/A] Granulation Quality: [1:None Present (0%)] [2:None Present (0%)] [N/A:N/A] Necrotic Amount: [1:Fat Layer (Subcutaneous Tissue): Yes Fascia: No] [N/A:N/A] Exposed Structures: [1:Fascia: No Tendon: No Muscle: No Joint: No Bone: No Large (67-100%)] [2:Fat Layer (Subcutaneous Tissue): No Tendon: No Muscle: No Joint: No Bone: No Large (67-100%)]  [N/A:N/A] Treatment Notes Electronic Signature(s) Signed: 12/07/2020 5:22:04 PM By: Linton Ham MD Signed: 12/08/2020 4:53:51 PM By: Deon Pilling Entered By: Linton Ham on 12/07/2020 13:28:32 -------------------------------------------------------------------------------- Multi-Disciplinary Care Plan Details Patient Name: Date of Service: Joshua Curtis Cooper 12/07/2020 12:30 PM Medical Record Number: [id number] Patient Account Number: [id number] Date of Birth/Sex: Treating RN: [birth year] (70 y.o. [name], [name] Primary Care Langley Flatley: Cathlean Cower Other Clinician: Referring Nahal Wanless: Treating Khayri Kargbo/Extender: Jeri Modena in Treatment: 7 Active Inactive Abuse / Safety / Falls / Self Care Management Nursing Diagnoses: Potential for falls Goals: Patient will remain injury free related to falls Date Initiated: 10/19/2020 Target Resolution Date: 01/19/2021 Goal Status: Active Interventions: Provide education on fall prevention Notes: Pain, Acute or Chronic Nursing Diagnoses: Pain, acute or chronic: actual or potential Potential alteration in comfort, pain Goals: Patient will verbalize adequate pain control and receive pain control interventions during procedures as needed Date Initiated: 10/19/2020 Target Resolution Date: 12/29/2020 Goal Status: Active Patient/caregiver will verbalize comfort level met Date Initiated: 10/19/2020 Target Resolution Date: 12/29/2020 Goal Status: Active Interventions: Encourage patient to take pain medications as prescribed Provide education on pain management Notes: Wound/Skin Impairment Nursing Diagnoses: Knowledge deficit related to ulceration/compromised skin integrity Goals: Patient/caregiver will verbalize understanding of skin care regimen Date Initiated: 10/19/2020 Target Resolution Date: 12/29/2020 Goal Status: Active Interventions: Assess patient/caregiver ability to obtain necessary supplies Assess  patient/caregiver ability to perform ulcer/skin care regimen upon admission and as needed Assess ulceration(s) every visit Provide education on smoking Provide education on ulcer and skin care Treatment Activities: Skin care regimen initiated : 10/19/2020 Topical wound management initiated : 10/19/2020 Notes: Electronic Signature(s) Signed: 12/08/2020 4:53:51 PM By: Deon Pilling Entered By: Deon Pilling on 12/07/2020 13:20:58 -------------------------------------------------------------------------------- Pain Assessment Details Patient Name: Date of Service: Joshua Curtis, THIVIERGE 12/07/2020 12:30 PM Medical Record Number: [id number] Patient Account Number: [id number] Date of Birth/Sex: Treating RN: 07-Nov-1950 (70 y.o. Ernestene Mention Primary Care Hasina Kreager: Cathlean Cower Other Clinician: Referring Luane Rochon: Treating Lacresia Darwish/Extender: Jeri Modena in Treatment: 7 Active Problems Location of Pain Severity and Description of Pain Patient Has Paino No Site Locations Rate the pain. Current Pain Level: 0 Pain Management and Medication Current Pain Management: Electronic Signature(s) Signed: 12/07/2020 5:18:58 PM By: Baruch Gouty RN, BSN Entered By: Baruch Gouty on 12/07/2020 12:57:49 -------------------------------------------------------------------------------- Patient/Caregiver Education Details Patient Name: Date of Service: Joshua Curtis Cooper 4/7/2022andnbsp12:30 PM Medical Record Number: [id number] Patient Account Number: [id number] Date of Birth/Gender: Treating RN: 11-Mar-1951 (70 y.o. [name] Primary Care Physician: Cathlean Cower Other Clinician: Referring Physician: Treating Physician/Extender: Linton Ham  Rogene [place] in Treatment: 7 Education Assessment Education Provided To: Patient Education Topics Provided Pain: Handouts: A Guide to Pain Control Methods: Explain/Verbal Responses: Reinforcements needed Electronic  Signature(s) Signed: 12/08/2020 4:53:51 PM By: Deon Pilling Entered By: Deon Pilling on 12/07/2020 13:21:08 -------------------------------------------------------------------------------- Wound Assessment Details Patient Name: Date of Service: Joshua Curtis, Joshua Curtis 12/07/2020 12:30 PM Medical Record Number: [id number] Patient Account Number: [id number] Date of Birth/Sex: Treating RN: [birth year] (70 y.o. Ernestene Mention Primary Care Sharlene Mccluskey: Cathlean Cower Other Clinician: Referring Bren Borys: Treating Shante Maysonet/Extender: Jeri Modena in Treatment: 7 Wound Status Wound Number: 1 Primary Arterial Insufficiency Ulcer Etiology: Wound Location: Right T Great oe Wound Status: Open Wounding Event: Gradually Appeared Comorbid Hypertension, Peripheral Arterial Disease, Peripheral Venous Date Acquired: 03/02/2020 History: Disease Weeks Of Treatment: 7 Clustered Wound: No Photos Wound Measurements Length: (cm) 0.2 Width: (cm) 0.2 Depth: (cm) 0.1 Area: (cm) 0.031 Volume: (cm) 0.003 % Reduction in Area: 89% % Reduction in Volume: 89.3% Epithelialization: Large (67-100%) Tunneling: No Undermining: No Wound Description Classification: Full Thickness Without Exposed Support Structures Wound Margin: Distinct, outline attached Exudate Amount: Small Exudate Type: Serosanguineous Exudate Color: red, brown Foul Odor After Cleansing: No Slough/Fibrino No Wound Bed Granulation Amount: Large (67-100%) Exposed Structure Granulation Quality: Pink Fascia Exposed: No Necrotic Amount: None Present (0%) Fat Layer (Subcutaneous Tissue) Exposed: Yes Tendon Exposed: No Muscle Exposed: No Joint Exposed: No Bone Exposed: No Treatment Notes Wound #1 (Toe Great) Wound Laterality: Right Cleanser Soap and Water Discharge Instruction: May shower and wash wound with dial antibacterial soap and water prior to dressing change. Peri-Wound Care Topical Primary Dressing Endoform  2x2 in Discharge Instruction: Moisten with [place] Jelly. Secondary Dressing Woven Gauze Sponges 2x2 in Discharge Instruction: Apply over primary dressing as directed. Secured With Conforming Stretch Gauze Bandage, Sterile 2x75 (in/in) Discharge Instruction: Secure with stretch gauze as directed. 54M Medipore H Soft Cloth Surgical T ape, 2x2 (in/yd) Discharge Instruction: Secure dressing with tape as directed. Compression Wrap Compression Stockings Add-Ons Electronic Signature(s) Signed: 12/07/2020 4:58:23 PM By: Sandre Kitty Signed: 12/07/2020 5:18:58 PM By: Baruch Gouty RN, BSN Entered By: Sandre Kitty on 12/07/2020 16:55:51 -------------------------------------------------------------------------------- Wound Assessment Details Patient Name: Date of Service: Joshua Curtis Cooper 12/07/2020 12:30 PM Medical Record Number: [id number] Patient Account Number: [id number] Date of Birth/Sex: Treating RN: 1951/08/22 (70 y.o. Ernestene Mention Primary Care Caylynn Minchew: Cathlean Cower Other Clinician: Referring Cesia Orf: Treating Rhyse Loux/Extender: Jeri Modena in Treatment: 7 Wound Status Wound Number: 2 Primary Arterial Insufficiency Ulcer Etiology: Wound Location: Left T Great oe Wound Status: Open Wounding Event: Gradually Appeared Comorbid Hypertension, Peripheral Arterial Disease, Peripheral Venous Date Acquired: 03/02/2020 History: Disease Weeks Of Treatment: 7 Clustered Wound: No Wound Measurements Length: (cm) Width: (cm) Depth: (cm) Area: (cm) Volume: (cm) Wound Description Classification: Full Thickness With Exposed Support Structu Exudate Amount: None Present Foul Odor After Cleansing: Slough/Fibrino 0 % Reduction in Area: 100% 0 % Reduction in Volume: 100% 0 Epithelialization: Large (67-100%) 0 Tunneling: No 0 Undermining: No res No No Wound Bed Granulation Amount: None Present (0%) Exposed Structure Necrotic Amount: None Present  (0%) Fascia Exposed: No Fat Layer (Subcutaneous Tissue) Exposed: No Tendon Exposed: No Muscle Exposed: No Joint Exposed: No Bone Exposed: No Electronic Signature(s) Signed: 12/07/2020 5:18:58 PM By: Baruch Gouty RN, BSN Entered By: Baruch Gouty on 12/07/2020 13:04:23 -------------------------------------------------------------------------------- [place] Details Patient Name: Date of Service: Joshua Curtis Cooper 12/07/2020 12:30 PM Medical Record Number: [id number] Patient Account Number: [id number] Date of Birth/Sex: Treating RN: [birth year] (69  y.o. Ernestene Mention Primary Care Dahna Hattabaugh: Cathlean Cower Other Clinician: Referring Ia Leeb: Treating Lucca Greggs/Extender: Jeri Modena in Treatment: 7 Vital Signs Time Taken: 12:57 Temperature (F): 98.8 Height (in): 70 Pulse (bpm): 94 Source: Stated Respiratory Rate (breaths/min): 18 Weight (lbs): 150 Blood Pressure (mmHg): 124/67 Source: Stated Reference Range: 80 - 120 mg / dl Body Mass Index (BMI): 21.5 Electronic Signature(s) Signed: 12/07/2020 5:18:58 PM By: Baruch Gouty RN, BSN Entered By: Baruch Gouty on 12/07/2020 12:57:37

## 2020-12-12 DIAGNOSIS — M6281 Muscle weakness (generalized): Secondary | ICD-10-CM | POA: Diagnosis not present

## 2020-12-12 DIAGNOSIS — M62838 Other muscle spasm: Secondary | ICD-10-CM | POA: Diagnosis not present

## 2020-12-12 DIAGNOSIS — M6289 Other specified disorders of muscle: Secondary | ICD-10-CM | POA: Diagnosis not present

## 2020-12-18 NOTE — Progress Notes (Signed)
Referring-Joshua Curtis Joshua Reichmann, MD Reason for referral-preoperative evaluation  HPI: 70 year old male for preoperative evaluation at request of Cathlean Cower, MD.  CTA March 2020 showed 4 cm aneurysmal dilatation of the ascending thoracic aorta, penetrating atherosclerotic ulcer in the distal descending thoracic aorta with ectasia measuring 3.3 cm, dilated pulmonary artery at 4 cm, emphysema and coronary calcification. Patient does have peripheral vascular disease followed by Dr. Trula Slade.  In November 2021 he was noted to have 95% calcified lesions in the superficial femoral and popliteal artery which were successfully stented.  The right popliteal was occluded.  He had angioplasty of the right tibioperoneal trunk and peroneal artery as well as stenting of the right popliteal and superficial femoral artery.  Patient has dyspnea with more vigorous activities but not routine activities.  No orthopnea, PND, pedal edema, exertional chest pain or syncope.  No claudication.  Cardiology asked to evaluate.  Current Outpatient Medications  Medication Sig Dispense Refill  . amLODipine-benazepril (LOTREL) 10-40 MG capsule TAKE 1 CAPSULE BY MOUTH  DAILY 90 capsule 3  . aspirin EC 81 MG tablet Take 81 mg by mouth daily. Swallow whole.    . clopidogrel (PLAVIX) 75 MG tablet Take 1 tablet (75 mg total) by mouth daily. 30 tablet 11  . ferrous sulfate 325 (65 FE) MG tablet Take 1 tablet (325 mg total) by mouth daily with breakfast. 90 tablet 0  . hydrochlorothiazide (MICROZIDE) 12.5 MG capsule TAKE 1 CAPSULE BY MOUTH  DAILY 90 capsule 3  . meloxicam (MOBIC) 15 MG tablet Take 1 tablet (15 mg total) by mouth daily as needed. 90 tablet 1  . rosuvastatin (CRESTOR) 10 MG tablet Take 1 tablet (10 mg total) by mouth daily. 30 tablet 11  . tamsulosin (FLOMAX) 0.4 MG CAPS capsule Take 0.4 mg by mouth daily.     No current facility-administered medications for this visit.    No Known Allergies   Past Medical History:   Diagnosis Date  . Anemia, iron deficiency 04/02/2016  . Aortic atherosclerosis ([place])   . Arthritis    rt knee  . BPH (benign prostatic hyperplasia) 04/10/2017  . COLONIC POLYPS, HX OF 11/24/2007  . COPD (chronic obstructive pulmonary disease) ([place])    pt denies this- no respiratory issues of any kind per pt  . Coronary artery calcification seen on CAT scan   . DIVERTICULOSIS, COLON 11/24/2007  . ED (erectile dysfunction)   . GERD (gastroesophageal reflux disease)   . HLD (hyperlipidemia) 09/06/2020  . HYPERTENSION 11/24/2007  . Increased prostate specific antigen (PSA) velocity 06/22/2011  . PAH (pulmonary artery hypertension) ([place])     Past Surgical History:  Procedure Laterality Date  . ABDOMINAL AORTOGRAM W/LOWER EXTREMITY N/A 07/04/2020   Procedure: ABDOMINAL AORTOGRAM W/LOWER EXTREMITY;  Surgeon: Serafina Mitchell, MD;  Location: [facility];  Service: Cardiovascular;  Laterality: N/A;  . ABDOMINAL AORTOGRAM W/LOWER EXTREMITY N/A 07/18/2020   Procedure: ABDOMINAL AORTOGRAM W/LOWER EXTREMITY;  Surgeon: Serafina Mitchell, MD;  Location: [facility];  Service: Cardiovascular;  Laterality: N/A;  . COLONOSCOPY    . NO PAST SURGERIES    . PERIPHERAL VASCULAR BALLOON ANGIOPLASTY Right 07/18/2020   Procedure: PERIPHERAL VASCULAR BALLOON ANGIOPLASTY;  Surgeon: Serafina Mitchell, MD;  Location: [facility];  Service: Cardiovascular;  Laterality: Right;  tibioperoneal trunk and posterior tibial  . PERIPHERAL VASCULAR INTERVENTION  07/04/2020   Procedure: PERIPHERAL VASCULAR INTERVENTION;  Surgeon: Serafina Mitchell, MD;  Location: [facility];  Service:  Cardiovascular;;  Lt. SFA and Popliteal  . PERIPHERAL VASCULAR INTERVENTION Right 07/18/2020   Procedure: PERIPHERAL VASCULAR INTERVENTION;  Surgeon: Serafina Mitchell, MD;  Location: [facility];  Service: Cardiovascular;  Laterality: Right;  Femoral Popliteal    Social History   Socioeconomic History  . Marital  status: Married    Spouse name: Not on file  . Number of children: 2  . Years of education: Not on file  . Highest education level: Not on file  Occupational History  . Not on file  Tobacco Use  . Smoking status: Current Every Day Smoker    Packs/day: 0.25    Types: Cigarettes  . Smokeless tobacco: Never Used  Vaping Use  . Vaping Use: Never used  Substance and Sexual Activity  . Alcohol use: Yes    Comment: 1 liquor drink per day   . Drug use: No  . Sexual activity: Not on file  Other Topics Concern  . Not on file  Social History Narrative  . Not on file   Social Determinants of Health   Financial Resource Strain: Not on file  Food Insecurity: Not on file  Transportation Needs: Not on file  Physical Activity: Not on file  Stress: Not on file  Social Connections: Not on file  Intimate Partner Violence: Not on file    Family History  Problem Relation Age of Onset  . Stroke Mother   . Diabetes Father   . Hypertension Sister   . Kidney failure Brother        dialysis  . Diabetes Paternal Grandfather   . Anuerysm Brother   . Colon cancer Neg Hx   . Esophageal cancer Neg Hx   . Rectal cancer Neg Hx   . Stomach cancer Neg Hx     ROS: no fevers or chills, productive cough, hemoptysis, dysphasia, odynophagia, melena, hematochezia, dysuria, hematuria, rash, seizure activity, orthopnea, PND, pedal edema, claudication. Remaining systems are negative.  Physical Exam:   Blood pressure (!) 112/58, pulse 66, height 5\' 10"  (1.778 m), weight 159 lb (72.1 kg).  General:  Well developed/well nourished in NAD Skin warm/dry Patient not depressed No peripheral clubbing Back-normal HEENT-normal/normal eyelids Neck supple/normal carotid upstroke bilaterally; no bruits; no JVD; no thyromegaly chest - CTA/ normal expansion CV - RRR/normal S1 and S2; no murmurs, rubs or gallops;  PMI nondisplaced Abdomen -NT/ND, no HSM, no mass, + bowel sounds, no bruit 2+ femoral pulses, no  bruits Ext-no edema, chords, 2+ DP Neuro-grossly nonfocal  ECG -normal sinus rhythm at a rate of 66, no ST changes.  Personally reviewed  A/P  1 preoperative evaluation prior to prostatectomy-patient has documented vascular disease and some dyspnea on exertion.  We will arrange a stress nuclear study to screen for ischemia particularly in light of previous CT demonstrating coronary calcification.  If no ischemia he may proceed with prostatectomy.  2 coronary artery disease-based on previous CT showing coronary calcification-continue aspirin.  Continue statin.  Plan nuclear study to screen for ischemia.  3 hypertension-blood pressure controlled.  Continue present medications and follow.  4 hyperlipidemia-given documented vascular disease I will increase Crestor from 10 to 40 mg daily.  In 12 weeks we will check lipids and liver.  5 dilated pulmonary artery consistent with pulmonary hypertension-likely secondary to COPD/lung disease.  6 tobacco abuse-patient counseled on discontinuing.  Kirk Ruths, MD

## 2020-12-21 ENCOUNTER — Ambulatory Visit (INDEPENDENT_AMBULATORY_CARE_PROVIDER_SITE_OTHER): Payer: BC Managed Care – PPO | Admitting: Physician Assistant

## 2020-12-21 ENCOUNTER — Other Ambulatory Visit: Payer: Self-pay

## 2020-12-21 ENCOUNTER — Ambulatory Visit (HOSPITAL_COMMUNITY)
Admission: RE | Admit: 2020-12-21 | Discharge: 2020-12-21 | Disposition: A | Discharge status: Home / Self Care | Payer: BC Managed Care – PPO | Source: Ambulatory Visit | Attending: Vascular Surgery | Admitting: Vascular Surgery

## 2020-12-21 ENCOUNTER — Ambulatory Visit (INDEPENDENT_AMBULATORY_CARE_PROVIDER_SITE_OTHER)
Admission: RE | Admit: 2020-12-21 | Discharge: 2020-12-21 | Disposition: A | Discharge status: Home / Self Care | Payer: BC Managed Care – PPO | Source: Ambulatory Visit | Attending: Vascular Surgery | Admitting: Vascular Surgery

## 2020-12-21 VITALS — BP 115/66 | HR 74 | Temp 98.0°F | Resp 20 | Ht 70.0 in | Wt 160.9 lb

## 2020-12-21 DIAGNOSIS — I7025 Atherosclerosis of native arteries of other extremities with ulceration: Secondary | ICD-10-CM

## 2020-12-21 DIAGNOSIS — L97529 Non-pressure chronic ulcer of other part of left foot with unspecified severity: Secondary | ICD-10-CM | POA: Diagnosis not present

## 2020-12-21 DIAGNOSIS — L97519 Non-pressure chronic ulcer of other part of right foot with unspecified severity: Secondary | ICD-10-CM

## 2020-12-21 NOTE — Progress Notes (Signed)
Office Note     CC:  follow up Requesting Provider:  Biagio Borg, MD  HPI: Joshua Curtis is a 70 y.o. (26-Dec-1950) male who presents for surveillance of PAD and great toe ulcerations bilaterally.  On 07/04/2020 he underwent left SFA and popliteal stenting by Dr. Trula Slade.  He was brought back on 07/18/2020 and underwent right TP trunk and peroneal artery angioplasty as well as stenting of the right popliteal and SFA also by Dr. Trula Slade.  He is receiving wound care with Dr. Dellia Nims at the wound clinic for great toe ulcerations.  He states they are both nearly completely healed.  He denies any further claudication symptoms.  He is on aspirin, Plavix, and statin daily.  He is a current smoker using Nicorette gum to try to quit.  He is scheduled next month for a prostatectomy.   Past Medical History:  Diagnosis Date  . Anemia, iron deficiency 04/02/2016  . Aortic atherosclerosis ([place])   . Arthritis    rt knee  . BPH (benign prostatic hyperplasia) 04/10/2017  . COLONIC POLYPS, HX OF 11/24/2007  . COPD (chronic obstructive pulmonary disease) ([place])    pt denies this- no respiratory issues of any kind per pt  . Coronary artery calcification seen on CAT scan   . DIVERTICULOSIS, COLON 11/24/2007  . ED (erectile dysfunction)   . GERD (gastroesophageal reflux disease)   . HLD (hyperlipidemia) 09/06/2020  . HYPERTENSION 11/24/2007  . Increased prostate specific antigen (PSA) velocity 06/22/2011  . PAH (pulmonary artery hypertension) ([place])     Past Surgical History:  Procedure Laterality Date  . ABDOMINAL AORTOGRAM W/LOWER EXTREMITY N/A 07/04/2020   Procedure: ABDOMINAL AORTOGRAM W/LOWER EXTREMITY;  Surgeon: Serafina Mitchell, MD;  Location: [facility];  Service: Cardiovascular;  Laterality: N/A;  . ABDOMINAL AORTOGRAM W/LOWER EXTREMITY N/A 07/18/2020   Procedure: ABDOMINAL AORTOGRAM W/LOWER EXTREMITY;  Surgeon: Serafina Mitchell, MD;  Location: [facility];  Service: Cardiovascular;   Laterality: N/A;  . COLONOSCOPY    . NO PAST SURGERIES    . PERIPHERAL VASCULAR BALLOON ANGIOPLASTY Right 07/18/2020   Procedure: PERIPHERAL VASCULAR BALLOON ANGIOPLASTY;  Surgeon: Serafina Mitchell, MD;  Location: [facility];  Service: Cardiovascular;  Laterality: Right;  tibioperoneal trunk and posterior tibial  . PERIPHERAL VASCULAR INTERVENTION  07/04/2020   Procedure: PERIPHERAL VASCULAR INTERVENTION;  Surgeon: Serafina Mitchell, MD;  Location: [facility];  Service: Cardiovascular;;  Lt. SFA and Popliteal  . PERIPHERAL VASCULAR INTERVENTION Right 07/18/2020   Procedure: PERIPHERAL VASCULAR INTERVENTION;  Surgeon: Serafina Mitchell, MD;  Location: [facility];  Service: Cardiovascular;  Laterality: Right;  Femoral Popliteal    Social History   Socioeconomic History  . Marital status: Married    Spouse name: Not on file  . Number of children: 2  . Years of education: Not on file  . Highest education level: Not on file  Occupational History  . Not on file  Tobacco Use  . Smoking status: Current Every Day Smoker    Packs/day: 0.25    Types: Cigarettes  . Smokeless tobacco: Never Used  Vaping Use  . Vaping Use: Never used  Substance and Sexual Activity  . Alcohol use: Yes    Comment: 1 liquor drink per day   . Drug use: No  . Sexual activity: Not on file  Other Topics Concern  . Not on file  Social History Narrative  . Not on file   Social  Determinants of Health   Financial Resource Strain: Not on file  Food Insecurity: Not on file  Transportation Needs: Not on file  Physical Activity: Not on file  Stress: Not on file  Social Connections: Not on file  Intimate Partner Violence: Not on file    Family History  Problem Relation Age of Onset  . Stroke Mother   . Diabetes Father   . Hypertension Sister   . Kidney failure Brother        dialysis  . Diabetes Paternal Grandfather   . Anuerysm Brother   . Colon cancer Neg Hx   . Esophageal cancer  Neg Hx   . Rectal cancer Neg Hx   . Stomach cancer Neg Hx     Current Outpatient Medications  Medication Sig Dispense Refill  . amLODipine-benazepril (LOTREL) 10-40 MG capsule TAKE 1 CAPSULE BY MOUTH  DAILY 90 capsule 3  . aspirin EC 81 MG tablet Take 81 mg by mouth daily. Swallow whole.    . clopidogrel (PLAVIX) 75 MG tablet Take 1 tablet (75 mg total) by mouth daily. 30 tablet 11  . ferrous sulfate 325 (65 FE) MG tablet Take 1 tablet (325 mg total) by mouth daily with breakfast. 90 tablet 0  . hydrochlorothiazide (MICROZIDE) 12.5 MG capsule TAKE 1 CAPSULE BY MOUTH  DAILY 90 capsule 3  . rosuvastatin (CRESTOR) 10 MG tablet Take 1 tablet (10 mg total) by mouth daily. 30 tablet 11  . collagenase (SANTYL) ointment Apply 1 application topically daily. (Patient not taking: Reported on 12/21/2020) 15 g 0  . meloxicam (MOBIC) 15 MG tablet Take 1 tablet (15 mg total) by mouth daily as needed. (Patient not taking: Reported on 12/21/2020) 90 tablet 1  . omeprazole (PRILOSEC) 40 MG capsule TAKE 1 CAPSULE IN THE MORNING AND AT BEDTIME. (INSURANCE WILL ONLY COVER ONE CAPSULE PER DAY) (Patient not taking: Reported on 12/21/2020) 30 capsule 1   No current facility-administered medications for this visit.    No Known Allergies   REVIEW OF SYSTEMS:   [X]  denotes positive finding, [ ]  denotes negative finding Cardiac  Comments:  Chest pain or chest pressure:    Shortness of breath upon exertion:    Short of breath when lying flat:    Irregular heart rhythm:        Vascular    Pain in calf, thigh, or hip brought on by ambulation:    Pain in feet at night that wakes you up from your sleep:     Blood clot in your veins:    Leg swelling:         Pulmonary    Oxygen at home:    Productive cough:     Wheezing:         Neurologic    Sudden weakness in arms or legs:     Sudden numbness in arms or legs:     Sudden onset of difficulty speaking or slurred speech:    Temporary loss of vision in one  eye:     Problems with dizziness:         Gastrointestinal    Blood in stool:     Vomited blood:         Genitourinary    Burning when urinating:     Blood in urine:        Psychiatric    Major depression:         Hematologic    Bleeding problems:    Problems with blood  clotting too easily:        Skin    Rashes or ulcers:        Constitutional    Fever or chills:      PHYSICAL EXAMINATION:  Vitals:   12/21/20 1406  BP: 115/66  Pulse: 74  Resp: 20  Temp: 98 F (36.7 C)  TempSrc: Temporal  SpO2: 97%  Weight: 160 lb 14.4 oz (73 kg)  Height: 5\' 10"  (1.778 m)    General:  WDWN in NAD; vital signs documented above Gait: Not observed HENT: WNL, normocephalic Pulmonary: normal non-labored breathing , without Rales, rhonchi,  wheezing Cardiac: regular HR Abdomen: soft, NT, no masses Skin: without rashes Vascular Exam/Pulses: Brisk DP and PT signals bilateral Extremities: without ischemic changes, small shallow dry ulcerations of bilateral great toe tips Musculoskeletal: no muscle wasting or atrophy  Neurologic: A&O X 3;  No focal weakness or paresthesias are detected Psychiatric:  The pt has Normal affect.   Non-Invasive Vascular Imaging:    Right profunda 316 cm/s Right SFA to popliteal stent widely patent Left SFA and popliteal stent widely patent  ABI/TBIToday's ABIToday's TBIPrevious ABIPrevious TBI  +-------+-----------+-----------+------------+------------+  Right 0.78    0     0.89    Not obtained  +-------+-----------+-----------+------------+------------+  Left  0.90    0     0.91    Not obtained  ASSESSMENT/PLAN:: 70 y.o. male here for wound check and surveillance of PAD  -Bilateral lower extremities appear to be well-perfused based on physical exam as well as arterial duplex demonstrating widely patent SFA to popliteal stents bilaterally -Great toe ulcerations bilaterally are nearly healed; continue  wound care with the wound clinic -Again encouraged smoking cessation -Recheck bilateral lower extremity arterial duplex and ABIs in 6 months; return sooner if wounds worsen or if claudication or rest pain develop   Dagoberto Ligas, PA-C [facility] [phone]  Clinic MD:   Oneida Alar

## 2020-12-22 ENCOUNTER — Encounter (HOSPITAL_BASED_OUTPATIENT_CLINIC_OR_DEPARTMENT_OTHER): Payer: BC Managed Care – PPO | Admitting: Internal Medicine

## 2020-12-22 DIAGNOSIS — I70245 Atherosclerosis of native arteries of left leg with ulceration of other part of foot: Secondary | ICD-10-CM | POA: Diagnosis not present

## 2020-12-22 DIAGNOSIS — L97519 Non-pressure chronic ulcer of other part of right foot with unspecified severity: Secondary | ICD-10-CM | POA: Diagnosis not present

## 2020-12-22 DIAGNOSIS — L97512 Non-pressure chronic ulcer of other part of right foot with fat layer exposed: Secondary | ICD-10-CM

## 2020-12-22 DIAGNOSIS — Z95820 Peripheral vascular angioplasty status with implants and grafts: Secondary | ICD-10-CM | POA: Diagnosis not present

## 2020-12-22 DIAGNOSIS — I70235 Atherosclerosis of native arteries of right leg with ulceration of other part of foot: Secondary | ICD-10-CM | POA: Diagnosis not present

## 2020-12-22 DIAGNOSIS — F17210 Nicotine dependence, cigarettes, uncomplicated: Secondary | ICD-10-CM | POA: Diagnosis not present

## 2020-12-22 DIAGNOSIS — L97524 Non-pressure chronic ulcer of other part of left foot with necrosis of bone: Secondary | ICD-10-CM | POA: Diagnosis not present

## 2020-12-22 DIAGNOSIS — C61 Malignant neoplasm of prostate: Secondary | ICD-10-CM | POA: Diagnosis not present

## 2020-12-22 NOTE — Progress Notes (Signed)
PRATIK, DALZIEL ([id number]) Visit Report for 12/22/2020 Chief Complaint Document Details Patient Name: Date of Service: Joshua Curtis, Joshua Curtis 12/22/2020 12:30 PM Medical Record Number: [id number] Patient Account Number: [id number] Date of Birth/Sex: Treating RN: 1951/07/31 (70 y.o. [name] Primary Care Provider: Cathlean Cower Other Clinician: Referring Provider: Treating Provider/Extender: Hollie Beach in Treatment: 9 Information Obtained from: Patient Chief Complaint 10/19/2020; patient is here for review of wounds on his bilateral anterior great toes in the setting of PAD Electronic Signature(s) Signed: 12/22/2020 2:02:57 PM By: Kalman Shan DO Entered By: Kalman Shan on 12/22/2020 13:14:57 -------------------------------------------------------------------------------- HPI Details Patient Name: Date of Service: Joshua Curtis 12/22/2020 12:30 PM Medical Record Number: [id number] Patient Account Number: [id number] Date of Birth/Sex: Treating RN: 02-Mar-1951 (70 y.o. [name] Primary Care Provider: Cathlean Cower Other Clinician: Referring Provider: Treating Provider/Extender: Hollie Beach in Treatment: 9 History of Present Illness HPI Description: ADMISSION 10/19/2020 This is a 70 year old nondiabetic man developed wounds on his bilateral great toes in the summer 2021. Eventually referred to Dr. Earleen Newport who first noted these wounds I think in November. Started him on Santyl noted poor arterial exam and referred him to Dr. Trula Slade. On 07/04/2020 the patient had angiography had stents placed in the left SFA and popliteal and a failed angioplasty on the left PTA noted that he had single-vessel runoff via the peroneal. On 07/18/2020 he had an angiogram on the right with angioplasty of the right tibial peroneal trunk and peroneal artery and a stent in the right popliteal and SFA. Since then he has been followed mostly by Dr.  Earleen Newport using Annitta Needs and Prisma more recently Prisma. It was noted that the left first toe healed at some point but reopened earlier this month. The patient also saw Dr. Megan Salon with a history of osteomyelitis in both of these areas. I have not reviewed the x-rays. He received 1 month of Augmentin in December and I think that Augmentin was actually extended. His arterial evaluation prior to intervention showed an ABI on the right of 0.51 on the left 0.55. In December these were repeated with an ABI in the right of 0.89 on the left 0.91 much improved. The patient states that he was having clear claudication symptoms beforehand now he does not have any pain in his legs when he walks although his activity is still somewhat limited. We did not repeat his ABIs in the clinic Past medical history the patient is not a diabetic but he is a smoker stating he is trying to quit and smoked 2 cigarettes today. He has a history of hypertension, prostate CA currently trying to make a decision between surgery and radiation, pulmonary hypertension and a history of PAD 2/24; patient is using endoform. His wounds look better today. I did review his x-rays subtle changes of osteomyelitis. He was treated by Dr. Megan Salon with least a month of Augmentin possibly more. 11/09/2020 on evaluation today patient actually appears to be doing well with his wounds. Both are showing signs of improvement. The right great toe actually is fairly clean today on evaluation. The left toes probably can require some debridement. Neither wound shows any signs of infection we have been using endoform effectively up to this point. He has been managed due to some subtle changes with regard to osteomyelitis noted on his x-rays by Dr. Megan Salon. He has been taking Augmentin. 3/24; both wounds are showing improvement today. Under illumination the surfaces look satisfactory and the dimensions are  smaller bilaterally. 4/7; 2-week follow-up. Still  continuing to improve the left toe wound is healed he still has an area on the tip of his right great toe right at the top of the nail edge. But this looks better as well. He is wearing surgical shoes. 4/22; patient is here for 2-week follow-up of right great toe wound. He has been using Endoform to this area with benefit. He reports that the area is closed. He denies any drainage, increased warmth or erythema to the foot. Electronic Signature(s) Signed: 12/22/2020 2:02:57 PM By: Kalman Shan DO Entered By: Kalman Shan on 12/22/2020 13:16:15 -------------------------------------------------------------------------------- Physical Exam Details Patient Name: Date of Service: Joshua Curtis, Joshua Curtis 12/22/2020 12:30 PM Medical Record Number: [id number] Patient Account Number: [id number] Date of Birth/Sex: Treating RN: Jul 26, 1951 (70 y.o. [name] Primary Care Provider: Cathlean Cower Other Clinician: Referring Provider: Treating Provider/Extender: Hollie Beach in Treatment: 9 Constitutional respirations regular, non-labored and within target range for patient.Marland Kitchen Psychiatric pleasant and cooperative. Notes Right great toe: Previous wound site is epithelialized. There is no drainage Electronic Signature(s) Signed: 12/22/2020 2:02:57 PM By: Kalman Shan DO Entered By: Kalman Shan on 12/22/2020 13:56:16 -------------------------------------------------------------------------------- Physician Orders Details Patient Name: Date of Service: Joshua Curtis 12/22/2020 12:30 PM Medical Record Number: [id number] Patient Account Number: [id number] Date of Birth/Sex: Treating RN: 1951-03-15 (70 y.o. [name] Primary Care Provider: Cathlean Cower Other Clinician: Referring Provider: Treating Provider/Extender: Hollie Beach in Treatment: 9 Verbal / Phone Orders: No Diagnosis Coding ICD-10 Coding Code Description I70.235  Atherosclerosis of native arteries of right leg with ulceration of other part of foot I70.245 Atherosclerosis of native arteries of left leg with ulceration of other part of foot L97.512 Non-pressure chronic ulcer of other part of right foot with fat layer exposed L97.524 Non-pressure chronic ulcer of other part of left foot with necrosis of bone Discharge From [facility] Services Discharge from [place] Bathing/ Shower/ Hygiene May shower with protection but do not get wound dressing(s) wet. - when not changing dressings. May shower and wash wound with soap and water. - with dressing changes only. Edema Control - Lymphedema / SCD / Other Moisturize legs daily. - every night before bed. Additional Orders / Instructions Other: - Pad right toe for protection x2 weeks. Patient to ensure no rubbing in shoes to tips of toe. May wear a regular shoe to left foot. Wound Treatment Electronic Signature(s) Signed: 12/22/2020 2:02:57 PM By: Kalman Shan DO Previous Signature: 12/22/2020 12:52:36 PM Version By: Lorrin Jackson Entered By: Kalman Shan on 12/22/2020 13:58:41 -------------------------------------------------------------------------------- Problem List Details Patient Name: Date of Service: Joshua Curtis 12/22/2020 12:30 PM Medical Record Number: [id number] Patient Account Number: [id number] Date of Birth/Sex: Treating RN: 1951/01/24 (70 y.o. [name] Primary Care Provider: Cathlean Cower Other Clinician: Referring Provider: Treating Provider/Extender: Hollie Beach in Treatment: 9 Active Problems ICD-10 Encounter Code Description Active Date MDM Diagnosis I70.235 Atherosclerosis of native arteries of right leg with ulceration of other part of 10/19/2020 No Yes foot I70.245 Atherosclerosis of native arteries of left leg with ulceration of other part of 10/19/2020 No Yes foot L97.512 Non-pressure chronic ulcer of other part of right foot  with fat layer exposed 10/19/2020 No Yes L97.524 Non-pressure chronic ulcer of other part of left foot with necrosis of bone 10/19/2020 No Yes Inactive Problems Resolved Problems Electronic Signature(s) Signed: 12/22/2020 2:02:57 PM By: Kalman Shan DO Previous Signature: 12/22/2020 12:52:14 PM Version By: Lorrin Jackson  Entered By: Kalman Shan on 12/22/2020 13:14:38 -------------------------------------------------------------------------------- Progress Note Details Patient Name: Date of Service: EARLY, STEEL 12/22/2020 12:30 PM Medical Record Number: [id number] Patient Account Number: [id number] Date of Birth/Sex: Treating RN: [birth year] (70 y.o. [name] Primary Care Provider: Cathlean Cower Other Clinician: Referring Provider: Treating Provider/Extender: Hollie Beach in Treatment: 9 Subjective Chief Complaint Information obtained from Patient 10/19/2020; patient is here for review of wounds on his bilateral anterior great toes in the setting of PAD History of Present Illness (HPI) ADMISSION 10/19/2020 This is a 70 year old nondiabetic man developed wounds on his bilateral great toes in the summer 2021. Eventually referred to Dr. Earleen Newport who first noted these wounds I think in November. Started him on Santyl noted poor arterial exam and referred him to Dr. Trula Slade. On 07/04/2020 the patient had angiography had stents placed in the left SFA and popliteal and a failed angioplasty on the left PTA noted that he had single-vessel runoff via the peroneal. On 07/18/2020 he had an angiogram on the right with angioplasty of the right tibial peroneal trunk and peroneal artery and a stent in the right popliteal and SFA. Since then he has been followed mostly by Dr. Earleen Newport using Annitta Needs and Prisma more recently Prisma. It was noted that the left first toe healed at some point but reopened earlier this month. The patient also saw Dr. Megan Salon with a history of  osteomyelitis in both of these areas. I have not reviewed the x-rays. He received 1 month of Augmentin in December and I think that Augmentin was actually extended. His arterial evaluation prior to intervention showed an ABI on the right of 0.51 on the left 0.55. In December these were repeated with an ABI in the right of 0.89 on the left 0.91 much improved. The patient states that he was having clear claudication symptoms beforehand now he does not have any pain in his legs when he walks although his activity is still somewhat limited. We did not repeat his ABIs in the clinic Past medical history the patient is not a diabetic but he is a smoker stating he is trying to quit and smoked 2 cigarettes today. He has a history of hypertension, prostate CA currently trying to make a decision between surgery and radiation, pulmonary hypertension and a history of PAD 2/24; patient is using endoform. His wounds look better today. I did review his x-rays subtle changes of osteomyelitis. He was treated by Dr. Megan Salon with least a month of Augmentin possibly more. 11/09/2020 on evaluation today patient actually appears to be doing well with his wounds. Both are showing signs of improvement. The right great toe actually is fairly clean today on evaluation. The left toes probably can require some debridement. Neither wound shows any signs of infection we have been using endoform effectively up to this point. He has been managed due to some subtle changes with regard to osteomyelitis noted on his x-rays by Dr. Megan Salon. He has been taking Augmentin. 3/24; both wounds are showing improvement today. Under illumination the surfaces look satisfactory and the dimensions are smaller bilaterally. 4/7; 2-week follow-up. Still continuing to improve the left toe wound is healed he still has an area on the tip of his right great toe right at the top of the nail edge. But this looks better as well. He is wearing surgical  shoes. 4/22; patient is here for 2-week follow-up of right great toe wound. He has been using Endoform to this area with benefit.  He reports that the area is closed. He denies any drainage, increased warmth or erythema to the foot. Patient History Information obtained from Patient. Family History Diabetes - Father,Paternal Grandparents, Hypertension - Siblings, Kidney Disease - Siblings, Stroke - Mother, No family history of Cancer, Heart Disease, Hereditary Spherocytosis, Seizures, Thyroid Problems, Tuberculosis. Social History Current every day smoker - 2 cigarettes a day, Marital Status - Married, Alcohol Use - Never, Drug Use - No History, Caffeine Use - Rarely. Medical History Cardiovascular Patient has history of Hypertension, Peripheral Arterial Disease, Peripheral Venous Disease Denies history of Congestive Heart Failure Medical A Surgical History Notes nd Cardiovascular Thoracic aortic aneurysm, pulmonary artery HTN Oncologic prostate cancer Objective Constitutional respirations regular, non-labored and within target range for patient.. Vitals Time Taken: 12:46 PM, Height: 70 in, Weight: 150 lbs, BMI: 21.5, Temperature: 98.47 F, Pulse: 74 bpm, Respiratory Rate: 17 breaths/min, Blood Pressure: 120/70 mmHg. Psychiatric pleasant and cooperative. General Notes: Right great toe: Previous wound site is epithelialized. There is no drainage Integumentary (Hair, Skin) Wound #1 status is Open. Original cause of wound was Gradually Appeared. The date acquired was: 03/02/2020. The wound has been in treatment 9 weeks. The wound is located on the Right T Great. The wound measures 0cm length x 0cm width x 0cm depth; 0cm^2 area and 0cm^3 volume. oe Assessment Active Problems ICD-10 Atherosclerosis of native arteries of right leg with ulceration of other part of foot Atherosclerosis of native arteries of left leg with ulceration of other part of foot Non-pressure chronic ulcer of other  part of right foot with fat layer exposed Non-pressure chronic ulcer of other part of left foot with necrosis of bone Patient presents for right great toe wound. Today it is closed and epithelialized. I recommended he continue to pad this area when he uses foot wear for at least 2 weeks to help reassure it does not open up. Plan Discharge From [facility] Services: Discharge from [place] Bathing/ Shower/ Hygiene: May shower with protection but do not get wound dressing(s) wet. - when not changing dressings. May shower and wash wound with soap and water. - with dressing changes only. Edema Control - Lymphedema / SCD / Other: Moisturize legs daily. - every night before bed. Additional Orders / Instructions: Other: - Pad right toe for protection x2 weeks. Patient to ensure no rubbing in shoes to tips of toe. May wear a regular shoe to left foot. He will be discharged from our clinic today. He was told to call us with any questions or concerns and can follow-up as needed. Electronic Signature(s) Signed: 12/22/2020 2:02:57 PM By: Kalman Shan DO Entered By: Kalman Shan on 12/22/2020 14:00:07 -------------------------------------------------------------------------------- HxROS Details Patient Name: Date of Service: Joshua Curtis 12/22/2020 12:30 PM Medical Record Number: [id number] Patient Account Number: [id number] Date of Birth/Sex: Treating RN: 01-Jul-1951 (70 y.o. [name] Primary Care Provider: Cathlean Cower Other Clinician: Referring Provider: Treating Provider/Extender: Hollie Beach in Treatment: 9 Information Obtained From Patient Cardiovascular Medical History: Positive for: Hypertension; Peripheral Arterial Disease; Peripheral Venous Disease Negative for: Congestive Heart Failure Past Medical History Notes: Thoracic aortic aneurysm, pulmonary artery HTN Oncologic Medical History: Past Medical History Notes: prostate  cancer Immunizations Pneumococcal Vaccine: Received Pneumococcal Vaccination: Yes Implantable Devices None Family and Social History Cancer: No; Diabetes: Yes - Father,Paternal Grandparents; Heart Disease: No; Hereditary Spherocytosis: No; Hypertension: Yes - Siblings; Kidney Disease: Yes - Siblings; Seizures: No; Stroke: Yes - Mother; Thyroid Problems: No; Tuberculosis: No; Current every day  smoker - 2 cigarettes a day; Marital Status - Married; Alcohol Use: Never; Drug Use: No History; Caffeine Use: Rarely; Financial Concerns: No; Food, Clothing or Shelter Needs: No; Support System Lacking: No; Transportation Concerns: No Electronic Signature(s) Signed: 12/22/2020 2:02:57 PM By: Kalman Shan DO Signed: 12/22/2020 5:01:44 PM By: Lorrin Jackson Entered By: Kalman Shan on 12/22/2020 13:16:21 -------------------------------------------------------------------------------- SuperBill Details Patient Name: Date of Service: Joshua Curtis 12/22/2020 Medical Record Number: [id number] Patient Account Number: [id number] Date of Birth/Sex: Treating RN: [birth year] (70 y.o. [name] Primary Care Provider: Cathlean Cower Other Clinician: Referring Provider: Treating Provider/Extender: Hollie Beach in Treatment: 9 Diagnosis Coding ICD-10 Codes Code Description [phone] Atherosclerosis of native arteries of right leg with ulceration of other part of foot I70.245 Atherosclerosis of native arteries of left leg with ulceration of other part of foot L97.512 Non-pressure chronic ulcer of other part of right foot with fat layer exposed L97.524 Non-pressure chronic ulcer of other part of left foot with necrosis of bone Facility Procedures CPT4 Code: [id number] Description: [id number] - WOUND CARE VISIT-LEV 2 EST PT Modifier: Quantity: 1 Electronic Signature(s) Signed: 12/22/2020 2:02:57 PM By: Kalman Shan DO Entered By: Kalman Shan on 12/22/2020 14:01:10

## 2020-12-25 ENCOUNTER — Ambulatory Visit (INDEPENDENT_AMBULATORY_CARE_PROVIDER_SITE_OTHER): Payer: BC Managed Care – PPO | Admitting: Cardiology

## 2020-12-25 ENCOUNTER — Encounter: Payer: Self-pay | Admitting: Cardiology

## 2020-12-25 ENCOUNTER — Other Ambulatory Visit: Payer: Self-pay

## 2020-12-25 VITALS — BP 112/58 | HR 66 | Ht 70.0 in | Wt 159.0 lb

## 2020-12-25 DIAGNOSIS — I712 Thoracic aortic aneurysm, without rupture, unspecified: Secondary | ICD-10-CM

## 2020-12-25 DIAGNOSIS — I2510 Atherosclerotic heart disease of native coronary artery without angina pectoris: Secondary | ICD-10-CM | POA: Diagnosis not present

## 2020-12-25 DIAGNOSIS — E7800 Pure hypercholesterolemia, unspecified: Secondary | ICD-10-CM

## 2020-12-25 DIAGNOSIS — Z720 Tobacco use: Secondary | ICD-10-CM

## 2020-12-25 DIAGNOSIS — Z01810 Encounter for preprocedural cardiovascular examination: Secondary | ICD-10-CM

## 2020-12-25 DIAGNOSIS — I10 Essential (primary) hypertension: Secondary | ICD-10-CM

## 2020-12-25 MED ORDER — ROSUVASTATIN CALCIUM 40 MG PO TABS: 40.0000 mg | ORAL_TABLET | Freq: Every day | ORAL | 3 refills | Status: DC

## 2020-12-25 NOTE — Patient Instructions (Signed)
Medication Instructions:   INCREASE ROSUVASTATIN TO 40 MG ONCE DAILY= 4 OF THE 10 MG TABLETS ONCE DAILY  *If you need a refill on your cardiac medications before your next appointment, please call your pharmacy*   Lab Work:  Your physician recommends that you return for lab work in: [facility]  If you have labs (blood work) drawn today and your tests are completely normal, you will receive your results only by: Marland Kitchen MyChart Message (if you have MyChart) OR . A paper copy in the mail If you have any lab test that is abnormal or we need to change your treatment, we will call you to review the results.   Testing/Procedures:  Your physician has requested that you have en exercise stress myoview. For further information please visit [email or web address]. Please follow instruction sheet, as given.[facility] [place]     Follow-Up: At [facility], you and your health needs are our priority.  As part of our continuing mission to provide you with exceptional heart care, we have created designated Provider Care Teams.  These Care Teams include your primary Cardiologist (physician) and Advanced Practice Providers (APPs -  Physician Assistants and Nurse Practitioners) who all work together to provide you with the care you need, when you need it.  We recommend signing up for the patient portal called "MyChart".  Sign up information is provided on this After Visit Summary.  MyChart is used to connect with patients for Virtual Visits (Telemedicine).  Patients are able to view lab/test results, encounter notes, upcoming appointments, etc.  Non-urgent messages can be sent to your provider as well.   To learn more about what you can do with MyChart, go to [email or web address].    Your next appointment:   12 month(s)  The format for your next appointment:   In Person  Provider:   Kirk Ruths, MD

## 2020-12-26 DIAGNOSIS — M62838 Other muscle spasm: Secondary | ICD-10-CM | POA: Diagnosis not present

## 2020-12-26 DIAGNOSIS — M6281 Muscle weakness (generalized): Secondary | ICD-10-CM | POA: Diagnosis not present

## 2020-12-26 DIAGNOSIS — N393 Stress incontinence (female) (male): Secondary | ICD-10-CM | POA: Diagnosis not present

## 2020-12-28 ENCOUNTER — Telehealth (HOSPITAL_COMMUNITY): Payer: Self-pay | Admitting: *Deleted

## 2020-12-28 NOTE — Telephone Encounter (Signed)
Patient given detailed instructions per Myocardial Perfusion Study Information Sheet for the test on 01/03/21. Patient notified to arrive 15 minutes early and that it is imperative to arrive on time for appointment to keep from having the test rescheduled.  If you need to cancel or reschedule your appointment, please call the office within 24 hours of your appointment. . Patient verbalized understanding. Kirstie Peri

## 2020-12-29 ENCOUNTER — Other Ambulatory Visit: Payer: Self-pay

## 2020-12-29 DIAGNOSIS — I7025 Atherosclerosis of native arteries of other extremities with ulceration: Secondary | ICD-10-CM

## 2020-12-29 NOTE — Patient Instructions (Signed)
DUE TO COVID-19 ONLY ONE VISITOR IS ALLOWED TO COME WITH YOU AND STAY IN THE WAITING ROOM ONLY DURING PRE OP AND PROCEDURE DAY OF SURGERY.   TWO  VISITOR  MAY VISIT WITH YOU AFTER SURGERY IN YOUR PRIVATE ROOM DURING VISITING HOURS ONLY!  YOU NEED TO HAVE A COVID 19 TEST ON___5-5-22____ @_______ , THIS TEST MUST BE DONE BEFORE SURGERY,  COVID TESTING SITE   [address] [place] [address],   IT IS ON THE RIGHT GOING OUT [address] APPROXIMATELY  2 MINUTES PAST [employer] ON THE RIGHT. ONCE YOUR COVID TEST IS COMPLETED,  PLEASE BEGIN THE QUARANTINE INSTRUCTIONS AS OUTLINED IN YOUR HANDOUT.                Joshua Curtis  12/29/2020   Your procedure is scheduled on: 01-08-21   Report to [facility] Main  Entrance   Report to admitting at         0930AM     Call this number if you have problems the morning of surgery [phone]    Remember: Do not eat food  :After Midnight.  You may have clear liquids until   0830 am then nothing by mouth     CLEAR LIQUID DIET   Foods Allowed                                                                                          Foods Excluded  Black Coffee and tea, regular and decaf                                                liquids that you cannot  Plain Jell-O any favor except red or purple                                           see through such as: Fruit ices (not with fruit pulp)                                                             milk, soups, orange juice  Iced Popsicles                                                                     All solid food Carbonated beverages, regular and diet  Cranberry, grape and apple juices Sports drinks like Gatorade Lightly seasoned clear broth or consume(fat free) Sugar, honey syrup  _____________________________________________________________________    BRUSH YOUR TEETH MORNING OF SURGERY AND RINSE YOUR MOUTH  OUT, NO CHEWING GUM CANDY OR MINTS.     Take these medicines the morning of surgery with A SIP OF WATER: flomax, rosuvastatin                                 You may not have any metal on your body including hair pins and              piercings  Do not wear jewelry,  lotions, powders or perfumes, deodorant              Men may shave face and neck.   Do not bring valuables to the hospital. [place].  Contacts, dentures or bridgework may not be worn into surgery.      Patients discharged the day of surgery will not be allowed to drive home. IF YOU ARE HAVING SURGERY AND GOING HOME THE SAME DAY, YOU MUST HAVE AN ADULT TO DRIVE YOU HOME AND BE WITH YOU FOR 24 HOURS. YOU MAY GO HOME BY TAXI OR UBER OR ORTHERWISE, BUT AN ADULT MUST ACCOMPANY YOU HOME AND STAY WITH YOU FOR 24 HOURS.  Name and phone number of your driver:  Special Instructions: N/A              Please read over the following fact sheets you were given: _____________________________________________________________________             [facility] - Preparing for Surgery Before surgery, you can play an important role.  Because skin is not sterile, your skin needs to be as free of germs as possible.  You can reduce the number of germs on your skin by washing with CHG (chlorahexidine gluconate) soap before surgery.  CHG is an antiseptic cleaner which kills germs and bonds with the skin to continue killing germs even after washing. Please DO NOT use if you have an allergy to CHG or antibacterial soaps.  If your skin becomes reddened/irritated stop using the CHG and inform your nurse when you arrive at Short Stay. Do not shave (including legs and underarms) for at least 48 hours prior to the first CHG shower.  You may shave your face/neck. Please follow these instructions carefully:  1.  Shower with CHG Soap the night before surgery and the  morning of Surgery.  2.  If you choose  to wash your hair, wash your hair first as usual with your  normal  shampoo.  3.  After you shampoo, rinse your hair and body thoroughly to remove the  shampoo.                           4.  Use CHG as you would any other liquid soap.  You can apply chg directly  to the skin and wash                       Gently with a scrungie or clean washcloth.  5.  Apply the CHG Soap to your body ONLY FROM THE NECK DOWN.   Do not use on face/ open  Wound or open sores. Avoid contact with eyes, ears mouth and genitals (private parts).                       Wash face,  Genitals (private parts) with your normal soap.             6.  Wash thoroughly, paying special attention to the area where your surgery  will be performed.  7.  Thoroughly rinse your body with warm water from the neck down.  8.  DO NOT shower/wash with your normal soap after using and rinsing off  the CHG Soap.                9.  Pat yourself dry with a clean towel.            10.  Wear clean pajamas.            11.  Place clean sheets on your bed the night of your first shower and do not  sleep with pets. Day of Surgery : Do not apply any lotions/deodorants the morning of surgery.  Please wear clean clothes to the hospital/surgery center.  FAILURE TO FOLLOW THESE INSTRUCTIONS MAY RESULT IN THE CANCELLATION OF YOUR SURGERY PATIENT SIGNATURE_________________________________  NURSE SIGNATURE__________________________________  ________________________________________________________________________   Adam Phenix  An incentive spirometer is a tool that can help keep your lungs clear and active. This tool measures how well you are filling your lungs with each breath. Taking long deep breaths may help reverse or decrease the chance of developing breathing (pulmonary) problems (especially infection) following:  A long period of time when you are unable to move or be active. BEFORE THE PROCEDURE   If the  spirometer includes an indicator to show your best effort, your nurse or respiratory therapist will set it to a desired goal.  If possible, sit up straight or lean slightly forward. Try not to slouch.  Hold the incentive spirometer in an upright position. INSTRUCTIONS FOR USE  1. Sit on the edge of your bed if possible, or sit up as far as you can in bed or on a chair. 2. Hold the incentive spirometer in an upright position. 3. Breathe out normally. 4. Place the mouthpiece in your mouth and seal your lips tightly around it. 5. Breathe in slowly and as deeply as possible, raising the piston or the ball toward the top of the column. 6. Hold your breath for 3-5 seconds or for as long as possible. Allow the piston or ball to fall to the bottom of the column. 7. Remove the mouthpiece from your mouth and breathe out normally. 8. Rest for a few seconds and repeat Steps 1 through 7 at least 10 times every 1-2 hours when you are awake. Take your time and take a few normal breaths between deep breaths. 9. The spirometer may include an indicator to show your best effort. Use the indicator as a goal to work toward during each repetition. 10. After each set of 10 deep breaths, practice coughing to be sure your lungs are clear. If you have an incision (the cut made at the time of surgery), support your incision when coughing by placing a pillow or rolled up towels firmly against it. Once you are able to get out of bed, walk around indoors and cough well. You may stop using the incentive spirometer when instructed by your caregiver.  RISKS AND COMPLICATIONS  Take your time so you do not get  dizzy or light-headed.  If you are in pain, you may need to take or ask for pain medication before doing incentive spirometry. It is harder to take a deep breath if you are having pain. AFTER USE  Rest and breathe slowly and easily.  It can be helpful to keep track of a log of your progress. Your caregiver can provide  you with a simple table to help with this. If you are using the spirometer at home, follow these instructions: [place] IF:   You are having difficultly using the spirometer.  You have trouble using the spirometer as often as instructed.  Your pain medication is not giving enough relief while using the spirometer.  You develop fever of 100.5 F (38.1 C) or higher. SEEK IMMEDIATE MEDICAL CARE IF:   You cough up bloody sputum that had not been present before.  You develop fever of 102 F (38.9 C) or greater.  You develop worsening pain at or near the incision site. MAKE SURE YOU:   Understand these instructions.  Will watch your condition.  Will get help right away if you are not doing well or get worse. Document Released: 12/30/2006 Document Revised: 11/11/2011 Document Reviewed: 03/02/2007 [facility] Patient Information 2014 [place], [place].   ________________________________________________________________________

## 2020-12-29 NOTE — Progress Notes (Addendum)
PCP - Cathlean Cower, MD Cardiologist - [place] 12-25-20 Kirk Ruths pending stress 01-03-21 for clearance Vascular 12-25-20 LOV epic PPM/ICD -  Device Orders -  Rep Notified -   Chest x-ray -  EKG - 12-25-20 Stress Test -  ECHO -  Cardiac Cath -   Sleep Study -  CPAP -   Fasting Blood Sugar -  Checks Blood Sugar _____ times a day  Blood Thinner Instructions: Plavix  7 days prior   Aspirin Instructions:  ERAS Protcol - PRE-SURGERY Ensure or G2-   COVID TEST- 5/5  Activity-Able to walk a flight of stairs without SOB Anesthesia review: HTN, CAD,   Patient denies shortness of breath, fever, cough and chest pain at PAT appointment   All instructions explained to the patient, with a verbal understanding of the material. Patient agrees to go over the instructions while at home for a better understanding. Patient also instructed to self quarantine after being tested for COVID-19. The opportunity to ask questions was provided.

## 2021-01-01 ENCOUNTER — Encounter (HOSPITAL_COMMUNITY)
Admission: RE | Admit: 2021-01-01 | Discharge: 2021-01-01 | Disposition: A | Discharge status: Home / Self Care | Payer: BC Managed Care – PPO | Source: Ambulatory Visit | Attending: Urology | Admitting: Urology

## 2021-01-01 ENCOUNTER — Other Ambulatory Visit: Payer: Self-pay

## 2021-01-01 ENCOUNTER — Encounter (HOSPITAL_COMMUNITY): Payer: Self-pay

## 2021-01-01 DIAGNOSIS — Z01812 Encounter for preprocedural laboratory examination: Secondary | ICD-10-CM | POA: Diagnosis not present

## 2021-01-01 HISTORY — DX: Peripheral vascular disease, unspecified: I73.9

## 2021-01-01 HISTORY — DX: Malignant (primary) neoplasm, unspecified: C80.1

## 2021-01-01 LAB — BASIC METABOLIC PANEL
Anion gap: 6 (ref 5–15)
BUN: 16 mg/dL (ref 8–23)
CO2: 27 mmol/L (ref 22–32)
Calcium: 8.8 mg/dL — ABNORMAL LOW (ref 8.9–10.3)
Chloride: 105 mmol/L (ref 98–111)
Creatinine, Ser: 1.07 mg/dL (ref 0.61–1.24)
GFR, Estimated: >60 mL/min (ref >60)
Glucose, Bld: 99 mg/dL (ref 70–99)
Potassium: 3.9 mmol/L (ref 3.5–5.1)
Sodium: 138 mmol/L (ref 135–145)

## 2021-01-01 LAB — CBC
HCT: 35.8 % — ABNORMAL LOW (ref 39.0–52.0)
Hemoglobin: 11.6 g/dL — ABNORMAL LOW (ref 13.0–17.0)
MCH: 30.6 pg (ref 26.0–34.0)
MCHC: 32.4 g/dL (ref 30.0–36.0)
MCV: 94.5 fL (ref 80.0–100.0)
Platelets: 261 10*3/uL (ref 150–400)
RBC: 3.79 MIL/uL — ABNORMAL LOW (ref 4.22–5.81)
RDW: 15.9 % — ABNORMAL HIGH (ref 11.5–15.5)
WBC: 5.3 10*3/uL (ref 4.0–10.5)
nRBC: 0 % (ref 0.0–0.2)

## 2021-01-02 ENCOUNTER — Telehealth: Payer: Self-pay | Admitting: Cardiology

## 2021-01-02 DIAGNOSIS — C61 Malignant neoplasm of prostate: Secondary | ICD-10-CM | POA: Diagnosis not present

## 2021-01-02 NOTE — Telephone Encounter (Signed)
Spoke with pt, aware he will fasting lab work in July.

## 2021-01-02 NOTE — Telephone Encounter (Signed)
PT is calling to check to see if he needs to get additional bloodwork for Dr.Crenshaw.Please advise

## 2021-01-03 ENCOUNTER — Other Ambulatory Visit: Payer: Self-pay

## 2021-01-03 ENCOUNTER — Ambulatory Visit (HOSPITAL_COMMUNITY): Payer: BC Managed Care – PPO | Attending: Internal Medicine

## 2021-01-03 DIAGNOSIS — Z01810 Encounter for preprocedural cardiovascular examination: Secondary | ICD-10-CM | POA: Diagnosis not present

## 2021-01-03 LAB — MYOCARDIAL PERFUSION IMAGING
Estimated workload: 7 METS
Exercise duration (min): 5 min
LV dias vol: 91 mL (ref 62–150)
LV sys vol: 47 mL
MPHR: 153 {beats}/min
Peak HR: 146 {beats}/min
Percent HR: 95 %
RPE: 19
Rest HR: 77 {beats}/min
SDS: 0
SRS: 0
SSS: 0
TID: 1.04

## 2021-01-03 MED ORDER — TECHNETIUM TC 99M TETROFOSMIN IV KIT
31.3000 | PACK | Freq: Once | INTRAVENOUS | Status: AC | PRN
Start: 2021-01-03 — End: 2021-01-03
  Administered 2021-01-03: 31.3 via INTRAVENOUS
  Filled 2021-01-03: qty 32

## 2021-01-03 MED ORDER — TECHNETIUM TC 99M TETROFOSMIN IV KIT
10.2000 | PACK | Freq: Once | INTRAVENOUS | Status: AC | PRN
Start: 2021-01-03 — End: 2021-01-03
  Administered 2021-01-03: 10.2 via INTRAVENOUS
  Filled 2021-01-03: qty 11

## 2021-01-04 ENCOUNTER — Other Ambulatory Visit (HOSPITAL_COMMUNITY)
Admission: RE | Admit: 2021-01-04 | Discharge: 2021-01-04 | Disposition: A | Discharge status: Home / Self Care | Payer: BC Managed Care – PPO | Source: Ambulatory Visit | Attending: Urology | Admitting: Urology

## 2021-01-04 DIAGNOSIS — Z01812 Encounter for preprocedural laboratory examination: Secondary | ICD-10-CM | POA: Diagnosis not present

## 2021-01-04 DIAGNOSIS — Z20822 Contact with and (suspected) exposure to covid-19: Secondary | ICD-10-CM | POA: Diagnosis not present

## 2021-01-05 ENCOUNTER — Telehealth: Payer: Self-pay | Admitting: Medical

## 2021-01-05 LAB — SARS CORONAVIRUS 2 (TAT 6-24 HRS): SARS Coronavirus 2: NEGATIVE

## 2021-01-05 NOTE — Anesthesia Preprocedure Evaluation (Addendum)
Anesthesia Evaluation  Patient identified by MRN, date of birth, ID band Patient awake    Reviewed: Allergy & Precautions, NPO status , Patient's Chart, lab work & pertinent test results  Airway Mallampati: II  TM Distance: >3 FB Neck ROM: Full    Dental  (+) Dental Advisory Given   Pulmonary COPD, Current Smoker and Patient abstained from smoking.,    breath sounds clear to auscultation       Cardiovascular hypertension, Pt. on medications + CAD, + Peripheral Vascular Disease and + DOE   Rhythm:Regular Rate:Normal  Stress Test 01/03/2021   The left ventricular ejection fraction is mildly decreased (45-54%).  Nuclear stress EF: 48%.  Blood pressure demonstrated a hypertensive response to exercise.  Upsloping ST segment depression ST segment depression of 0.5 mm was noted during stress in the II and III leads, beginning at 5 minutes of stress, and returning to baseline after less than 1 minute of recovery.  This is an intermediate risk study.  Negative stress test for ischemia. There is evidence of apical thinning; there is no wall motion abnormality consistent with apical infarct. Prominent left ventricular hypertrophy on ECG. Intermediate risk study in the setting LVEF 48%.   Neuro/Psych negative neurological ROS     GI/Hepatic Neg liver ROS, GERD  ,  Endo/Other  negative endocrine ROS  Renal/GU negative Renal ROS     Musculoskeletal  (+) Arthritis ,   Abdominal   Peds  Hematology  (+) anemia ,   Anesthesia Other Findings   Reproductive/Obstetrics                            Lab Results  Component Value Date   WBC 5.3 01/01/2021   HGB 11.6 (L) 01/01/2021   HCT 35.8 (L) 01/01/2021   MCV 94.5 01/01/2021   PLT 261 01/01/2021   Lab Results  Component Value Date   CREATININE 1.07 01/01/2021   BUN 16 01/01/2021   NA 138 01/01/2021   K 3.9 01/01/2021   CL 105 01/01/2021   CO2  27 01/01/2021    Anesthesia Physical Anesthesia Plan  ASA: III  Anesthesia Plan: General   Post-op Pain Management:    Induction: Intravenous  PONV Risk Score and Plan: 1 and Dexamethasone, Ondansetron and Treatment may vary due to age or medical condition  Airway Management Planned: Oral ETT  Additional Equipment: None  Intra-op Plan:   Post-operative Plan: Extubation in OR  Informed Consent: I have reviewed the patients History and Physical, chart, labs and discussed the procedure including the risks, benefits and alternatives for the proposed anesthesia with the patient or authorized representative who has indicated his/her understanding and acceptance.     Dental advisory given  Plan Discussed with: CRNA  Anesthesia Plan Comments: (See PAT note 01/01/2021, Konrad Felix, PA-C)       Anesthesia Quick Evaluation

## 2021-01-05 NOTE — H&P (Signed)
Office Visit Report     01/02/2021   --------------------------------------------------------------------------------   Joshua Curtis  MRN: [id number]  DOB: 1951/06/17, 70 year old Male  SSN: [id number]   PRIMARY CARE:  Cathlean Cower, MD  REFERRING:  Cathlean Cower, MD  PROVIDER:  Rexene Alberts, M.D.  TREATING:  Leta [place], [place]  LOCATION:  [facility] [phone]     --------------------------------------------------------------------------------   CC/HPI: Pt presents today for pre-operative history and physical exam in anticipation of robotic assisted lap radical prostatectomy with bilateral pelvic lymph node dissection by Dr. Alinda Money on 01/08/21. He is doing well and is without complaint. He saw Dr. Stanford Breed for cards clearance and states he was told he was fine for surgery but the documentation is not currently available for review.   Pt denies F/C, HA, CP, SOB, N/V, diarrhea/constipation, back pain, flank pain, hematuria, and dysuria.     HX:    CC: Prostate Cancer   Physician requesting consult: Dr. Windell Norfolk  PCP: Dr. Cathlean Cower   Joshua Curtis is a 70 year old gentleman with a history of an elevated PSA. He is s/p a prostate biopsy in November 2017 that was benign. His PSA increased to 10.7 recently prompting a repeat TRUS biopsy of the prostate by Dr. Abner Greenspan that confirmed Gleason 3+4=7 adenocarcinoma with 2 out of 12 biopsy cores positive for malignancy. He was noted to have a very prominent intravesical median lobe on ultrasound with the median lobe itself measuring about 35 cc.   Family history: None.   Imaging studies: None.   PMH: He has a history of GERD, hypertension, and peripheral artery disease s/p bilateral iliac stents.  PSH: No abdominal surgeries.   TNM stage: cT1c Nx Mx  PSA: 10.7  Gleason score: 3+4=7 (GG 2)  Biopsy (09/26/20): 2/12 cores positive  Left: Benign  Right: R base (5%, 3+4=7), R lateral base (10%, 3+4=7)  Prostate  volume: 72 cc (about half is median lobe)   Nomogram  OC disease: 67%  EPE: 33%  SVI: 3%  LNI: 2%  PFS (5 year, 10 year): 85%, 74%   Urinary function: IPSS is 11. He is noted to have a very large median lobe measuring over 30 cc itself. He does take tamsulosin 0.4 mg.  Erectile function: SHIM score is 3. He does have severe baseline erectile dysfunction likely related to his peripheral arterial disease.     ALLERGIES: No Allergies    MEDICATIONS: Hydrochlorothiazide 12.5 mg capsule  Omeprazole 20 mg capsule,delayed release  Tamsulosin Hcl 0.4 mg capsule 1 capsule PO Q HS  Amlodipine Besylate-Benazepril 10 mg-40 mg capsule  Aspir-81 81 MG Oral Tablet Delayed Release Oral     Notes: nicorette gum   GU PSH: Cystoscopy - 11/07/2020 Prostate Needle Biopsy - 09/26/2020, 2017, 2012       Joshua Curtis Notes: Biopsy Of The Prostate Needle, No Surgical Problems  bilateral iliac stents by Dr. Cristy Curtis PSH: Surgical Pathology, Gross And Microscopic Examination For Prostate Needle - 09/26/2020, 2017     GU PMH: Stress Incontinence - 12/26/2020 Prostate Cancer - 11/17/2020, - 11/07/2020, - 10/03/2020 Weak Urinary Stream - 11/07/2020 Incomplete bladder emptying - 10/03/2020, - 07/17/2020 Urinary Frequency - 10/03/2020, - 07/17/2020 BPH w/LUTS - 07/17/2020, (Stable), He does have BPH by exam. He has no obstructive voiding symptoms., - 2019 Elevated PSA - 07/17/2020, (Stable), His prostate was noted to be benign on examination again today with PSA that is actually down  somewhat but stable overall currently at 7.78 with a good free to total ratio of 29% indicating most likely benign elevation. I have recommended continued monitoring and will check his PSA again in 6 months with DRE and PSA again in 12 months., - 07/20/2019 (Worsening), He will undergo an MRI scan of the prostate and determination of the need for fusion biopsy based on those results., - 01/12/2019 (Stable), His prostate was noted to be benign  again today. His PSA is 5.74 which is actually lower than it has been in the past couple of years. He maintained % free PSA suggestive of benign elevation. I have recommended we continue to monitor it with a repeat PSA in 3 months and DRE and PSA again, - 07/14/2018 (Improving), His prostate was again noted to be benign to examination with a PSA of 6.23 that is actually down the last 2 times I have checked it with maintenance of a % free PSA suggesting a benign cause for the elevation. I have recommended continued monitoring of his PSA and DRE., - 2019, His prostate was noted to be benign to exam however his PSA has increased slightly to 9.57. His free to total ratio remains suggestive of benign causes of his PSA elevation but I told him that my recommendation is that we follow his PSA closely with a repeat again in 3 months and then again in 3 months following that the with the understanding that if his PSA continues to rise a repeat biopsy would be in order., - 2018 (Stable), His PSA remains relatively stable at 7.93 with an excellent free to total ratio 26%. At this point I recommended continued observation with repeat DRE and PSA again in 6 months., - 2018 (Stable), I'm going to continue to monitor his DRE and PSA every 6 months., - 2017 (Stable), Return in 1 week to go over the results of his prostate biopsy. He has a very bulbar median lobe component protruding into the bladder, - 2017, He is going to stop his daily aspirin and has elected to proceed with a prostate biopsy., - 2017, Elevated prostate specific antigen (PSA), - 2014 Nocturia - 07/17/2020 Urinary Urgency, He does have some urgency but has no urge incontinence. - 2019 BPH w/o LUTS (Stable), His prostate, although enlarged slightly, it is smooth and benign and he has no new voiding symptoms. I will continue to monitor this. - 2018      PMH Notes:   1) Prostate cancer: He is s/p a UNS RAL radical prostatectomy and BPLND on 01/08/21.    Diagnosis:  Pretreatment PSA: 10.7  Pretreatment SHIM score: 3   NON-GU PMH: Muscle weakness (generalized) - 12/26/2020, - 12/12/2020 Other muscle spasm - 12/26/2020, - 12/12/2020 Other specified disorders of muscle - 12/12/2020 Diverticulosis, Diverticulosis - 2014 Personal history of other diseases of the circulatory system, History of hypertension - 2014, History of hypertension, - 2014 Encounter for general adult medical examination without abnormal findings, Encounter for preventive health examination Iron deficiency anemia, unspecified    FAMILY HISTORY: Death In The Family Mother - [place] In Family Diabetes - Runs In Family Family Health Status - Father alive at age 37 - 81 In Family Hypertension - Runs In Family Stroke Syndrome - Runs In Family   SOCIAL HISTORY: Marital Status: Married Preferred Language: English; Ethnicity: Not Hispanic Or Latino; Race: Black or African American Current Smoking Status: Patient smokes. Has smoked since 01/01/1991. Smokes less than 1/2 pack per day.   Tobacco Use Assessment  Completed: Used Tobacco in last 30 days? Smoking cessation counseling was provided. Does not use smokeless tobacco. Types of alcohol consumed: Beer. Light Drinker.  Does not use drugs. Drinks 4+ caffeinated drinks per day. Has had a blood transfusion.     Notes: 1 cigarette per day; trying to quit  1 beer on occasion  Transfusion after iliac stents    REVIEW OF SYSTEMS:    GU Review Male:   Patient denies frequent urination, hard to postpone urination, burning/ pain with urination, get up at night to urinate, leakage of urine, stream starts and stops, trouble starting your stream, have to strain to urinate , erection problems, and penile pain.  Gastrointestinal (Upper):   Patient denies nausea, vomiting, and indigestion/ heartburn.  Gastrointestinal (Lower):   Patient denies constipation and diarrhea.  Constitutional:   Patient denies fever, night sweats, weight loss,  and fatigue.  Skin:   Patient denies skin rash/ lesion and itching.  Eyes:   Patient denies blurred vision and double vision.  Ears/ Nose/ Throat:   Patient denies sore throat and sinus problems.  Hematologic/Lymphatic:   Patient denies swollen glands and easy bruising.  Cardiovascular:   Patient denies leg swelling and chest pains.  Respiratory:   Patient denies cough and shortness of breath.  Endocrine:   Patient denies excessive thirst.  Musculoskeletal:   Patient denies back pain and joint pain.  Neurological:   Patient denies headaches and dizziness.  Psychologic:   Patient denies depression and anxiety.   VITAL SIGNS:      01/02/2021 11:08 AM  Weight 160 lb / 72.57 kg  Height 70 in / 177.8 cm  BP 116/78 mmHg  Pulse 85 /min  Temperature 97.3 F / 36.2 C  BMI 23.0 kg/m   MULTI-SYSTEM PHYSICAL EXAMINATION:    Constitutional: Well-nourished. No physical deformities. Normally developed. Good grooming.  Neck: Neck symmetrical, not swollen. Normal tracheal position.  Respiratory: Normal breath sounds. No labored breathing, no use of accessory muscles.   Cardiovascular: Regular rate and rhythm. No murmur, no gallop.   Lymphatic: No enlargement of neck, axillae, groin.  Skin: No paleness, no jaundice, no cyanosis. No lesion, no ulcer, no rash.  Neurologic / Psychiatric: Oriented to time, oriented to place, oriented to person. No depression, no anxiety, no agitation.  Gastrointestinal: No mass, no tenderness, no rigidity, non obese abdomen.  Eyes: Normal conjunctivae. Normal eyelids.  Ears, Nose, Mouth, and Throat: Left ear no scars, no lesions, no masses. Right ear no scars, no lesions, no masses. Nose no scars, no lesions, no masses. Normal hearing. Normal lips.  Musculoskeletal: Normal gait and station of head and neck.     Complexity of Data:  Records Review:   Previous Patient Records  Urine Test Review:   Urinalysis   01/02/21  Urinalysis  Urine Appearance Clear   Urine  Color Yellow   Urine Glucose Neg mg/dL  Urine Bilirubin Neg mg/dL  Urine Ketones Neg mg/dL  Urine Specific Gravity 1.025   Urine Blood Neg ery/uL  Urine pH 5.5   Urine Protein Neg mg/dL  Urine Urobilinogen 0.2 mg/dL  Urine Nitrites Neg   Urine Leukocyte Esterase Neg leu/uL   PROCEDURES:          Urinalysis - 81003 Dipstick Dipstick Cont'd  Color: Yellow Bilirubin: Neg mg/dL  Appearance: Clear Ketones: Neg mg/dL  Specific Gravity: 1.025 Blood: Neg ery/uL  pH: 5.5 Protein: Neg mg/dL  Glucose: Neg mg/dL Urobilinogen: 0.2 mg/dL    Nitrites: Neg  Leukocyte Esterase: Neg leu/uL    ASSESSMENT:      ICD-10 Details  1 GU:   Prostate Cancer - C61    PLAN:           Schedule Return Visit/Planned Activity: Keep Scheduled Appointment - Schedule Surgery          Document Letter(s):  Created for Patient: Clinical Summary         Notes:   There are no changes in the patients history or physical exam since last evaluation by Dr. Alinda Money. Pt is scheduled to undergo RALP with BPLND on 01/08/21.   All pt's questions were answered to the best of my ability.          Next Appointment:      Next Appointment: 01/08/2021 11:30 AM    Appointment Type: Surgery     Location: [facility] [phone]    Provider: Raynelle Bring, M.D.    Reason for Visit: WL/EXT REC RA LAP RAD PROSTATECTOMY LEVEL 3, BPLA WITH AMANDA      * Signed by Mcarthur Rossetti, PA on 01/02/21 at 11:48 AM (EDT)*

## 2021-01-05 NOTE — Progress Notes (Addendum)
Anesthesia Chart Review   Case: [id number] Date/Time: 01/08/21 1115   Procedure: XI ROBOTIC ASSISTED LAPAROSCOPIC RADICAL PROSTATECTOMY LEVEL 3 (N/A )   Anesthesia type: General   Pre-op diagnosis: PROSTATE CANCER   Location: [facility] 03 / [facility]   Surgeons: Raynelle Bring, MD      DISCUSSION:70 y.o. current every day smoker with h/o GERD, COPD, PVD, HTN, CTA March 2020 showed 4 cm aneurysmal dilatation of the ascending thoracic aorta, BPH, prostate cancer scheduled for above procedure 01/08/2021 with Dr. Raynelle Bring.   Pt last seen by cardiology 12/25/2020. Per OV note pt experiencing dyspnea with vigorous activities. Per Dr. Stanford Breed, "preoperative evaluation prior to prostatectomy-patient has documented vascular disease and some dyspnea on exertion.  We will arrange a stress nuclear study to screen for ischemia particularly in light of previous CT demonstrating coronary calcification.  If no ischemia he may proceed with prostatectomy."  Stress test 01/03/2021 negative for ischemia, intermediate risk study in setting of LVEF 48%.   Per cardiology note 01/05/2021, "Chart reviewed as part of pre-operative protocol coverage. Patient was seen by Dr. Stanford Breed 12/25/20 and recommended to undergo a NST to r/o ischemia given plans for upcoming procedure. NST completed 01/03/21 showed no evidence of ischemia, though EF reported to be 48%. Results reviewed with Dr. Percival Spanish, as Dr. Stanford Breed is on vacation. Given past medical history and time since last visit, based on ACC/AHA guidelines, [facility]ON would be at acceptable risk for the planned procedure without further cardiovascular testing."  Anticipate pt can proceed with planned procedure barring acute status change.   VS: There were no vitals taken for this visit.  PROVIDERS: Biagio Borg, MD is PCP    LABS: Labs reviewed: Acceptable for surgery. (all labs ordered are listed, but only abnormal results are displayed)  Labs Reviewed  CBC -  Abnormal; Notable for the following components:      Result Value   RBC 3.79 (*)    Hemoglobin 11.6 (*)    HCT 35.8 (*)    RDW 15.9 (*)    All other components within normal limits  BASIC METABOLIC PANEL - Abnormal; Notable for the following components:   Calcium 8.8 (*)    All other components within normal limits  TYPE AND SCREEN     IMAGES:   EKG: 12/25/2020 Rate 66 bpm  NSR  CV: Stress Test 01/03/2021   The left ventricular ejection fraction is mildly decreased (45-54%).  Nuclear stress EF: 48%.  Blood pressure demonstrated a hypertensive response to exercise.  Upsloping ST segment depression ST segment depression of 0.5 mm was noted during stress in the II and III leads, beginning at 5 minutes of stress, and returning to baseline after less than 1 minute of recovery.  This is an intermediate risk study.   Negative stress test for ischemia. There is evidence of apical thinning; there is no wall motion abnormality consistent with apical infarct. Prominent left ventricular hypertrophy on ECG. Intermediate risk study in the setting LVEF 48%.  Past Medical History:  Diagnosis Date  . Anemia, iron deficiency 04/02/2016  . Aortic atherosclerosis ([place])   . Arthritis    rt knee  . BPH (benign prostatic hyperplasia) 04/10/2017  . Cancer [facility])    prostate  . COLONIC POLYPS, HX OF 11/24/2007  . COPD (chronic obstructive pulmonary disease) ([place])    pt denies this- no respiratory issues of any kind per pt  . Coronary artery calcification seen on CAT scan   .  DIVERTICULOSIS, COLON 11/24/2007  . ED (erectile dysfunction)   . GERD (gastroesophageal reflux disease)   . HLD (hyperlipidemia) 09/06/2020  . HYPERTENSION 11/24/2007  . Increased prostate specific antigen (PSA) velocity 06/22/2011  . PAH (pulmonary artery hypertension) ([place])   . Peripheral vascular disease [facility])     Past Surgical History:  Procedure Laterality Date  . ABDOMINAL AORTOGRAM W/LOWER EXTREMITY N/A  07/04/2020   Procedure: ABDOMINAL AORTOGRAM W/LOWER EXTREMITY;  Surgeon: Serafina Mitchell, MD;  Location: [place] CV LAB;  Service: Cardiovascular;  Laterality: N/A;  . ABDOMINAL AORTOGRAM W/LOWER EXTREMITY N/A 07/18/2020   Procedure: ABDOMINAL AORTOGRAM W/LOWER EXTREMITY;  Surgeon: Serafina Mitchell, MD;  Location: [place] [facility] LAB;  Service: Cardiovascular;  Laterality: N/A;  . COLONOSCOPY    . NO PAST SURGERIES    . PERIPHERAL VASCULAR BALLOON ANGIOPLASTY Right 07/18/2020   Procedure: PERIPHERAL VASCULAR BALLOON ANGIOPLASTY;  Surgeon: Serafina Mitchell, MD;  Location: [place] [facility] LAB;  Service: Cardiovascular;  Laterality: Right;  tibioperoneal trunk and posterior tibial  . PERIPHERAL VASCULAR INTERVENTION  07/04/2020   Procedure: PERIPHERAL VASCULAR INTERVENTION;  Surgeon: Serafina Mitchell, MD;  Location: [place] [facility] LAB;  Service: Cardiovascular;;  Lt. SFA and Popliteal  . PERIPHERAL VASCULAR INTERVENTION Right 07/18/2020   Procedure: PERIPHERAL VASCULAR INTERVENTION;  Surgeon: Serafina Mitchell, MD;  Location: [place] [facility] LAB;  Service: Cardiovascular;  Laterality: Right;  Femoral Popliteal    MEDICATIONS: . amLODipine-benazepril (LOTREL) 10-40 MG capsule  . aspirin EC 81 MG tablet  . clopidogrel (PLAVIX) 75 MG tablet  . ferrous sulfate 325 (65 FE) MG tablet  . hydrochlorothiazide (MICROZIDE) 12.5 MG capsule  . meloxicam (MOBIC) 15 MG tablet  . rosuvastatin (CRESTOR) 10 MG tablet  . rosuvastatin (CRESTOR) 40 MG tablet  . tamsulosin (FLOMAX) 0.4 MG CAPS capsule   No current facility-administered medications for this encounter.   Konrad Felix, PA-C [facility] Pre-Surgical Testing [phone]

## 2021-01-05 NOTE — Progress Notes (Signed)
MARKALE, BIRDSELL ([id number]) Visit Report for 12/22/2020 Arrival Information Details Patient Name: Date of Service: ERSEL, ENSLIN 12/22/2020 12:30 PM Medical Record Number: [id number] Patient Account Number: [id number] Date of Birth/Sex: Treating RN: [birth year] (70 y.o. [name], [name] Primary Care Naija Troost: Cathlean Cower Other Clinician: Referring Ashleigh Arya: Treating Ashni Lonzo/Extender: Hollie Beach in Treatment: 9 Visit Information History Since Last Visit Added or deleted any medications: No Patient Arrived: Ambulatory Any new allergies or adverse reactions: No Arrival Time: 12:46 Had a fall or experienced change in No Accompanied By: self activities of daily living that may affect Transfer Assistance: None risk of falls: Patient Identification Verified: Yes Signs or symptoms of abuse/neglect since last visito No Secondary Verification Process Completed: Yes Hospitalized since last visit: No Patient Requires Transmission-Based Precautions: No Implantable device outside of the clinic excluding No Patient Has Alerts: Yes cellular tissue based products placed in the center Patient Alerts: Patient on Blood Thinner since last visit: L ABI: 0.91 Has Dressing in Place as Prescribed: Yes R ABI: 0.89 (08/2020) Pain Present Now: No Electronic Signature(s) Signed: 01/05/2021 3:14:58 PM By: Rhae Hammock RN Entered By: Rhae Hammock on 12/22/2020 12:46:27 -------------------------------------------------------------------------------- Clinic Level of Care Assessment Details Patient Name: Date of Service: MIKIAH, DEMOND 12/22/2020 12:30 PM Medical Record Number: [id number] Patient Account Number: [id number] Date of Birth/Sex: Treating RN: [birth year] (71 y.o. [name] Primary Care Bladen Umar: Cathlean Cower Other Clinician: Referring Wynter Grave: Treating Kaelee Pfeffer/Extender: Hollie Beach in Treatment: 9 Clinic Level of  Care Assessment Items TOOL 4 Quantity Score X- 1 0 Use when only an EandM is performed on FOLLOW-UP visit ASSESSMENTS - Nursing Assessment / Reassessment X- 1 10 Reassessment of Co-morbidities (includes updates in patient status) X- 1 5 Reassessment of Adherence to Treatment Plan ASSESSMENTS - Wound and Skin A ssessment / Reassessment X - Simple Wound Assessment / Reassessment - one wound 1 5 []  - 0 Complex Wound Assessment / Reassessment - multiple wounds []  - 0 Dermatologic / Skin Assessment (not related to wound area) ASSESSMENTS - Focused Assessment []  - 0 Circumferential Edema Measurements - multi extremities []  - 0 Nutritional Assessment / Counseling / Intervention []  - 0 Lower Extremity Assessment (monofilament, tuning fork, pulses) []  - 0 Peripheral Arterial Disease Assessment (using hand held doppler) ASSESSMENTS - Ostomy and/or Continence Assessment and Care []  - 0 Incontinence Assessment and Management []  - 0 Ostomy Care Assessment and Management (repouching, etc.) PROCESS - Coordination of Care []  - 0 Simple Patient / Family Education for ongoing care X- 1 20 Complex (extensive) Patient / Family Education for ongoing care []  - 0 Staff obtains Programmer, systems, Records, T Results / Process Orders est []  - 0 Staff telephones HHA, Nursing Homes / Clarify orders / etc []  - 0 Routine Transfer to another Facility (non-emergent condition) []  - 0 Routine Hospital Admission (non-emergent condition) []  - 0 New Admissions / Biomedical engineer / Ordering NPWT Apligraf, etc. , []  - 0 Emergency Hospital Admission (emergent condition) X- 1 10 Simple Discharge Coordination []  - 0 Complex (extensive) Discharge Coordination PROCESS - Special Needs []  - 0 Pediatric / Minor Patient Management []  - 0 Isolation Patient Management []  - 0 Hearing / Language / Visual special needs []  - 0 Assessment of Community assistance (transportation, D/C planning, etc.) []  -  0 Additional assistance / Altered mentation []  - 0 Support Surface(s) Assessment (bed, cushion, seat, etc.) INTERVENTIONS - Wound Cleansing / Measurement []  - 0 Simple Wound Cleansing - one wound []  -  0 Complex Wound Cleansing - multiple wounds X- 1 5 Wound Imaging (photographs - any number of wounds) []  - 0 Wound Tracing (instead of photographs) []  - 0 Simple Wound Measurement - one wound []  - 0 Complex Wound Measurement - multiple wounds INTERVENTIONS - Wound Dressings []  - 0 Small Wound Dressing one or multiple wounds []  - 0 Medium Wound Dressing one or multiple wounds []  - 0 Large Wound Dressing one or multiple wounds []  - 0 Application of Medications - topical []  - 0 Application of Medications - injection INTERVENTIONS - Miscellaneous []  - 0 External ear exam []  - 0 Specimen Collection (cultures, biopsies, blood, body fluids, etc.) []  - 0 Specimen(s) / Culture(s) sent or taken to Lab for analysis []  - 0 Patient Transfer (multiple staff / Civil Service fast streamer / Similar devices) []  - 0 Simple Staple / Suture removal (25 or less) []  - 0 Complex Staple / Suture removal (26 or more) []  - 0 Hypo / Hyperglycemic Management (close monitor of Blood Glucose) []  - 0 Ankle / Brachial Index (ABI) - do not check if billed separately X- 1 5 Vital Signs Has the patient been seen at the hospital within the last three years: Yes Total Score: 60 Level Of Care: New/Established - Level 2 Electronic Signature(s) Signed: 12/22/2020 5:01:44 PM By: Lorrin Jackson Entered By: Lorrin Jackson on 12/22/2020 13:12:00 -------------------------------------------------------------------------------- Encounter Discharge Information Details Patient Name: Date of Service: [name] 12/22/2020 12:30 PM Medical Record Number: [id number] Patient Account Number: [id number] Date of Birth/Sex: Treating RN: 1951/06/18 (70 y.o. [name] Primary Care Mayla Biddy: Cathlean Cower Other  Clinician: Referring Marieelena Bartko: Treating Kimmi Acocella/Extender: Hollie Beach in Treatment: 9 Encounter Discharge Information Items Discharge Condition: Stable Ambulatory Status: Ambulatory Discharge Destination: Home Transportation: Private Auto Accompanied By: self Schedule Follow-up Appointment: No Clinical Summary of Care: Notes padded closed areas to toes. Electronic Signature(s) Signed: 12/22/2020 5:09:10 PM By: Deon Pilling Entered By: Deon Pilling on 12/22/2020 13:22:00 -------------------------------------------------------------------------------- Lower Extremity Assessment Details Patient Name: Date of Service: EGBERT, SEIDEL 12/22/2020 12:30 PM Medical Record Number: [id number] Patient Account Number: [id number] Date of Birth/Sex: Treating RN: 09/30/1950 (70 y.o. [name], [name] Primary Care Siedah Sedor: Cathlean Cower Other Clinician: Referring Caylen Yardley: Treating Ashanti Ratti/Extender: Hollie Beach in Treatment: 9 Edema Assessment Assessed: Shirlyn Goltz: Yes] Patrice Paradise: Yes] Edema: [Left: No] [Right: No] Calf Left: Right: Point of Measurement: 33 cm From Medial Instep 30 cm 29 cm Ankle Left: Right: Point of Measurement: 11 cm From Medial Instep 20 cm 20 cm Vascular Assessment Pulses: Dorsalis Pedis Palpable: [Right:Yes] Posterior Tibial Palpable: [Right:Yes] Electronic Signature(s) Signed: 01/05/2021 3:14:58 PM By: Rhae Hammock RN Entered By: Rhae Hammock on 12/22/2020 12:49:37 -------------------------------------------------------------------------------- Multi Wound Chart Details Patient Name: Date of Service: [name] 12/22/2020 12:30 PM Medical Record Number: [id number] Patient Account Number: [id number] Date of Birth/Sex: Treating RN: 1950-11-18 (70 y.o. [name] Primary Care Arie Powell: Cathlean Cower Other Clinician: Referring Mckinlee Dunk: Treating Ondria Oswald/Extender: Hollie Beach in Treatment: 9 Vital Signs Height(in): 70 Pulse(bpm): 75 Weight(lbs): 150 Blood Pressure(mmHg): 120/70 Body Mass Index(BMI): 22 Temperature(F): 98.47 Respiratory Rate(breaths/min): 17 Photos: [1:No Photos Right T Great oe] [N/A:N/A N/A] Wound Location: [1:Gradually Appeared] [N/A:N/A] Wounding Event: [1:Arterial Insufficiency Ulcer] [N/A:N/A] Primary Etiology: [1:03/02/2020] [N/A:N/A] Date Acquired: [1:9] [N/A:N/A] Weeks of Treatment: [1:Open] [N/A:N/A] Wound Status: [1:0x0x0] [N/A:N/A] Measurements L x W x D (cm) [1:0] [N/A:N/A] A (cm) : rea [1:0] [N/A:N/A] Volume (cm) : [1:100.00%] [N/A:N/A] % Reduction in A rea: [1:100.00%] [  N/A:N/A] % Reduction in Volume: [1:Full Thickness Without Exposed] [N/A:N/A] Classification: [1:Support Structures] Treatment Notes Electronic Signature(s) Signed: 12/22/2020 2:02:57 PM By: Kalman Shan DO Signed: 12/22/2020 5:01:44 PM By: Lorrin Jackson Entered By: Kalman Shan on 12/22/2020 13:14:45 -------------------------------------------------------------------------------- Multi-Disciplinary Care Plan Details Patient Name: Date of Service: [name]. 12/22/2020 12:30 PM Medical Record Number: [id number] Patient Account Number: [id number] Date of Birth/Sex: Treating RN: [birth year] (70 y.o. [name] Primary Care Quasim Doyon: Cathlean Cower Other Clinician: Referring Nashonda Limberg: Treating Deondra Wigger/Extender: Hollie Beach in Treatment: [place] reviewed with physician Active Inactive Electronic Signature(s) Signed: 12/22/2020 2:06:12 PM By: Lorrin Jackson Previous Signature: 12/22/2020 12:53:30 PM Version By: Lorrin Jackson Entered By: Lorrin Jackson on 12/22/2020 14:06:12 -------------------------------------------------------------------------------- Pain Assessment Details Patient Name: Date of Service: MADS, BORGMEYER 12/22/2020 12:30 PM Medical Record Number:  [id number] Patient Account Number: [id number] Date of Birth/Sex: Treating RN: 01/20/1951 (70 y.o. [name], [name] Primary Care Tareva Leske: Cathlean Cower Other Clinician: Referring Meztli Llanas: Treating Elene Downum/Extender: Hollie Beach in Treatment: 9 Active Problems Location of Pain Severity and Description of Pain Patient Has Paino No Site Locations Pain Management and Medication Current Pain Management: Electronic Signature(s) Signed: 01/05/2021 3:14:58 PM By: Rhae Hammock RN Entered By: Rhae Hammock on 12/22/2020 12:47:48 -------------------------------------------------------------------------------- Patient/Caregiver Education Details Patient Name: Date of Service: [name] 4/22/2022andnbsp12:30 PM Medical Record Number: [id number] Patient Account Number: [id number] Date of Birth/Gender: Treating RN: 29-Aug-1951 (70 y.o. [name] Primary Care Physician: Cathlean Cower Other Clinician: Referring Physician: Treating Physician/Extender: Hollie Beach in Treatment: 9 Education Assessment Education Provided To: Patient Education Topics Provided Smoking and Wound Healing: Methods: Explain/Verbal Responses: State content correctly Wound/Skin Impairment: Methods: Explain/Verbal, Printed Responses: State content correctly Electronic Signature(s) Signed: 12/22/2020 5:01:44 PM By: Lorrin Jackson Entered By: Lorrin Jackson on 12/22/2020 12:54:13 -------------------------------------------------------------------------------- Wound Assessment Details Patient Name: Date of Service: [name] 12/22/2020 12:30 PM Medical Record Number: [id number] Patient Account Number: [id number] Date of Birth/Sex: Treating RN: 1951/06/12 (70 y.o. [name], [name] Primary Care Mikhai Bienvenue: Cathlean Cower Other Clinician: Referring Debe Anfinson: Treating Tausha Milhoan/Extender: Hollie Beach in Treatment:  9 Wound Status Wound Number: 1 Primary Arterial Insufficiency Ulcer Etiology: Wound Location: Right T Great oe Wound Status: Healed - Epithelialized Wounding Event: Gradually Appeared Comorbid Hypertension, Peripheral Arterial Disease, Peripheral Venous Date Acquired: 03/02/2020 History: Disease Weeks Of Treatment: 9 Clustered Wound: No Photos Wound Measurements Length: (cm) 0 Width: (cm) 0 Depth: (cm) 0 Area: (cm) 0 Volume: (cm) 0 % Reduction in Area: 100% % Reduction in Volume: 100% Wound Description Classification: Full Thickness Without Exposed Support Structur Electronic Signature(s) Signed: 12/26/2020 2:09:33 PM By: Sandre Kitty Signed: 01/05/2021 3:14:58 PM By: Rhae Hammock RN Previous Signature: 12/22/2020 2:05:30 PM Version By: London Pepper Entered By: Da es Monico Hoar, Destiny on 12/22/2020 16:33:36 -------------------------------------------------------------------------------- Vitals Details Patient Name: Date of Service: [place] A. 12/22/2020 12:30 PM Medical Record Number: [id number] Patient Account Number: [id number] Date of Birth/Sex: Treating RN: 01-17-1951 (70 y.o. [name], [name] Primary Care Rajeev Escue: Cathlean Cower Other Clinician: Referring Enrrique Mierzwa: Treating Juanitta Earnhardt/Extender: Hollie Beach in Treatment: 9 Vital Signs Time Taken: 12:46 Temperature (F): 98.47 Height (in): 70 Pulse (bpm): 74 Weight (lbs): 150 Respiratory Rate (breaths/min): 17 Body Mass Index (BMI): 21.5 Blood Pressure (mmHg): 120/70 Reference Range: 80 - 120 mg / dl Electronic Signature(s) Signed: 01/05/2021 3:14:58 PM By: Rhae Hammock RN Entered By: Rhae Hammock on 12/22/2020 12:47:40

## 2021-01-05 NOTE — Telephone Encounter (Signed)
    Joshua Curtis DOB:  29-Sep-1950  MRN:  [id number]   Primary Cardiologist: Kirk Ruths, MD  Chart reviewed as part of pre-operative protocol coverage. Patient was seen by Dr. Stanford Breed 12/25/20 and recommended to undergo a NST to r/o ischemia given plans for upcoming procedure. NST completed 01/03/21 showed no evidence of ischemia, though EF reported to be 48%. Results reviewed with Dr. Percival Spanish, as Dr. Stanford Breed is on vacation. Given past medical history and time since last visit, based on ACC/AHA guidelines, Joshua Curtis would be at acceptable risk for the planned procedure without further cardiovascular testing.   I will route this recommendation to the requesting party via Epic fax function and remove from pre-op pool.  Please call with questions.  Abigail Butts, PA-C 01/05/2021, 1:50 PM

## 2021-01-08 ENCOUNTER — Observation Stay (HOSPITAL_COMMUNITY)
Admission: RE | Admit: 2021-01-08 | Discharge: 2021-01-09 | Disposition: A | Discharge status: Home / Self Care | Payer: BC Managed Care – PPO | Source: Ambulatory Visit | Attending: Urology | Admitting: Urology

## 2021-01-08 ENCOUNTER — Other Ambulatory Visit: Payer: Self-pay

## 2021-01-08 ENCOUNTER — Encounter (HOSPITAL_COMMUNITY)
Admission: RE | Disposition: A | Discharge status: Home / Self Care | Payer: Self-pay | Source: Ambulatory Visit | Attending: Urology

## 2021-01-08 ENCOUNTER — Encounter (HOSPITAL_COMMUNITY): Payer: Self-pay | Admitting: Urology

## 2021-01-08 ENCOUNTER — Ambulatory Visit (HOSPITAL_COMMUNITY): Payer: BC Managed Care – PPO | Admitting: Physician Assistant

## 2021-01-08 ENCOUNTER — Ambulatory Visit (HOSPITAL_COMMUNITY): Payer: BC Managed Care – PPO | Admitting: Anesthesiology

## 2021-01-08 DIAGNOSIS — I10 Essential (primary) hypertension: Secondary | ICD-10-CM | POA: Diagnosis not present

## 2021-01-08 DIAGNOSIS — C61 Malignant neoplasm of prostate: Principal | ICD-10-CM | POA: Insufficient documentation

## 2021-01-08 DIAGNOSIS — E785 Hyperlipidemia, unspecified: Secondary | ICD-10-CM | POA: Diagnosis not present

## 2021-01-08 DIAGNOSIS — E559 Vitamin D deficiency, unspecified: Secondary | ICD-10-CM | POA: Diagnosis not present

## 2021-01-08 DIAGNOSIS — F17210 Nicotine dependence, cigarettes, uncomplicated: Secondary | ICD-10-CM | POA: Diagnosis not present

## 2021-01-08 DIAGNOSIS — Z79899 Other long term (current) drug therapy: Secondary | ICD-10-CM | POA: Diagnosis not present

## 2021-01-08 DIAGNOSIS — Z8546 Personal history of malignant neoplasm of prostate: Secondary | ICD-10-CM | POA: Diagnosis present

## 2021-01-08 DIAGNOSIS — D509 Iron deficiency anemia, unspecified: Secondary | ICD-10-CM | POA: Diagnosis not present

## 2021-01-08 DIAGNOSIS — Z7982 Long term (current) use of aspirin: Secondary | ICD-10-CM | POA: Diagnosis not present

## 2021-01-08 HISTORY — PX: ROBOT ASSISTED LAPAROSCOPIC RADICAL PROSTATECTOMY: SHX5141

## 2021-01-08 LAB — TYPE AND SCREEN
ABO/RH(D): O POS
Antibody Screen: NEGATIVE

## 2021-01-08 LAB — GLUCOSE, CAPILLARY: Glucose-Capillary: 176 mg/dL — ABNORMAL HIGH (ref 70–99)

## 2021-01-08 LAB — HEMOGLOBIN AND HEMATOCRIT, BLOOD
HCT: 33.5 % — ABNORMAL LOW (ref 39.0–52.0)
Hemoglobin: 10.8 g/dL — ABNORMAL LOW (ref 13.0–17.0)

## 2021-01-08 SURGERY — XI ROBOTIC ASSISTED LAPAROSCOPIC RADICAL PROSTATECTOMY LEVEL 3
Anesthesia: General

## 2021-01-08 MED ORDER — HYDROCHLOROTHIAZIDE 12.5 MG PO CAPS
12.5000 mg | ORAL_CAPSULE | Freq: Every day | ORAL | Status: DC
  Administered 2021-01-09: 12.5 mg via ORAL
  Filled 2021-01-08: qty 1

## 2021-01-08 MED ORDER — EPHEDRINE 5 MG/ML INJ
INTRAVENOUS | Status: AC
  Filled 2021-01-08: qty 20

## 2021-01-08 MED ORDER — SODIUM CHLORIDE 0.9 % IR SOLN
Status: DC | PRN
  Administered 2021-01-08: 1000 mL via INTRAVESICAL

## 2021-01-08 MED ORDER — FENTANYL CITRATE (PF) 250 MCG/5ML IJ SOLN
INTRAMUSCULAR | Status: AC
  Filled 2021-01-08: qty 5

## 2021-01-08 MED ORDER — FENTANYL CITRATE (PF) 100 MCG/2ML IJ SOLN
INTRAMUSCULAR | Status: DC | PRN
  Administered 2021-01-08 (×3): 50 ug via INTRAVENOUS

## 2021-01-08 MED ORDER — ROCURONIUM BROMIDE 10 MG/ML (PF) SYRINGE
PREFILLED_SYRINGE | INTRAVENOUS | Status: DC | PRN
  Administered 2021-01-08 (×2): 10 mg via INTRAVENOUS
  Administered 2021-01-08: 50 mg via INTRAVENOUS
  Administered 2021-01-08: 20 mg via INTRAVENOUS

## 2021-01-08 MED ORDER — LACTATED RINGERS IV SOLN: INTRAVENOUS | Status: DC

## 2021-01-08 MED ORDER — MIDAZOLAM HCL 2 MG/2ML IJ SOLN
INTRAMUSCULAR | Status: AC
  Filled 2021-01-08: qty 2

## 2021-01-08 MED ORDER — BELLADONNA ALKALOIDS-OPIUM 16.2-60 MG RE SUPP
1.0000 | Freq: Four times a day (QID) | RECTAL | Status: DC | PRN
Start: 2021-01-08 — End: 2021-01-09
  Filled 2021-01-08: qty 1

## 2021-01-08 MED ORDER — TRAMADOL HCL 50 MG PO TABS: 50.0000 mg | ORAL_TABLET | Freq: Four times a day (QID) | ORAL | 0 refills | Status: DC | PRN

## 2021-01-08 MED ORDER — FLEET ENEMA 7-19 GM/118ML RE ENEM: 1.0000 | ENEMA | Freq: Once | RECTAL | Status: DC

## 2021-01-08 MED ORDER — INDIGOTINDISULFONATE SODIUM 8 MG/ML IJ SOLN
INTRAMUSCULAR | Status: DC | PRN
  Administered 2021-01-08: 5 mL via INTRAVENOUS

## 2021-01-08 MED ORDER — ROCURONIUM BROMIDE 10 MG/ML (PF) SYRINGE
PREFILLED_SYRINGE | INTRAVENOUS | Status: AC
  Filled 2021-01-08: qty 30

## 2021-01-08 MED ORDER — KETOROLAC TROMETHAMINE 15 MG/ML IJ SOLN
INTRAMUSCULAR | Status: AC
  Filled 2021-01-08: qty 1

## 2021-01-08 MED ORDER — SODIUM CHLORIDE 0.9 % IV SOLN
1.0000 g | Freq: Three times a day (TID) | INTRAVENOUS | Status: AC
  Administered 2021-01-08 – 2021-01-09 (×2): 1 g via INTRAVENOUS
  Filled 2021-01-08 (×2): qty 1

## 2021-01-08 MED ORDER — ACETAMINOPHEN 500 MG PO TABS
ORAL_TABLET | ORAL | Status: AC
  Filled 2021-01-08: qty 1

## 2021-01-08 MED ORDER — BUPIVACAINE-EPINEPHRINE (PF) 0.25% -1:200000 IJ SOLN
INTRAMUSCULAR | Status: AC
  Filled 2021-01-08: qty 30

## 2021-01-08 MED ORDER — DEXAMETHASONE SODIUM PHOSPHATE 10 MG/ML IJ SOLN
INTRAMUSCULAR | Status: DC | PRN
  Administered 2021-01-08: 5 mg via INTRAVENOUS

## 2021-01-08 MED ORDER — FENTANYL CITRATE (PF) 100 MCG/2ML IJ SOLN
25.0000 ug | INTRAMUSCULAR | Status: DC | PRN
  Administered 2021-01-08: 50 ug via INTRAVENOUS

## 2021-01-08 MED ORDER — PHENYLEPHRINE 40 MCG/ML (10ML) SYRINGE FOR IV PUSH (FOR BLOOD PRESSURE SUPPORT)
PREFILLED_SYRINGE | INTRAVENOUS | Status: DC | PRN
  Administered 2021-01-08: 80 ug via INTRAVENOUS

## 2021-01-08 MED ORDER — DIPHENHYDRAMINE HCL 12.5 MG/5ML PO ELIX: 12.5000 mg | ORAL_SOLUTION | Freq: Four times a day (QID) | ORAL | Status: DC | PRN

## 2021-01-08 MED ORDER — EPHEDRINE SULFATE-NACL 50-0.9 MG/10ML-% IV SOSY
PREFILLED_SYRINGE | INTRAVENOUS | Status: DC | PRN
  Administered 2021-01-08 (×2): 5 mg via INTRAVENOUS

## 2021-01-08 MED ORDER — SODIUM CHLORIDE 0.9 % IV BOLUS: 1000.0000 mL | Freq: Once | INTRAVENOUS | Status: DC

## 2021-01-08 MED ORDER — LACTATED RINGERS IV SOLN
INTRAVENOUS | Status: DC | PRN
  Administered 2021-01-08: 1000 mL

## 2021-01-08 MED ORDER — DIPHENHYDRAMINE HCL 50 MG/ML IJ SOLN: 12.5000 mg | Freq: Four times a day (QID) | INTRAMUSCULAR | Status: DC | PRN

## 2021-01-08 MED ORDER — FERROUS SULFATE 325 (65 FE) MG PO TABS
325.0000 mg | ORAL_TABLET | Freq: Every day | ORAL | Status: DC
  Administered 2021-01-09: 325 mg via ORAL
  Filled 2021-01-08: qty 1

## 2021-01-08 MED ORDER — HEPARIN SODIUM (PORCINE) 1000 UNIT/ML IJ SOLN
INTRAMUSCULAR | Status: AC
  Filled 2021-01-08: qty 1

## 2021-01-08 MED ORDER — KCL IN DEXTROSE-NACL 20-5-0.45 MEQ/L-%-% IV SOLN
INTRAVENOUS | Status: DC
  Filled 2021-01-08 (×4): qty 1000

## 2021-01-08 MED ORDER — MIDAZOLAM HCL 5 MG/5ML IJ SOLN
INTRAMUSCULAR | Status: DC | PRN
  Administered 2021-01-08: 2 mg via INTRAVENOUS

## 2021-01-08 MED ORDER — MAGNESIUM CITRATE PO SOLN: 1.0000 | Freq: Once | ORAL | Status: DC

## 2021-01-08 MED ORDER — CHLORHEXIDINE GLUCONATE 0.12 % MT SOLN: 15.0000 mL | Freq: Once | OROMUCOSAL | Status: AC

## 2021-01-08 MED ORDER — ACETAMINOPHEN 325 MG PO TABS: 650.0000 mg | ORAL_TABLET | ORAL | Status: DC | PRN

## 2021-01-08 MED ORDER — INDIGOTINDISULFONATE SODIUM 8 MG/ML IJ SOLN
INTRAMUSCULAR | Status: AC
  Filled 2021-01-08: qty 5

## 2021-01-08 MED ORDER — PHENYLEPHRINE 40 MCG/ML (10ML) SYRINGE FOR IV PUSH (FOR BLOOD PRESSURE SUPPORT)
PREFILLED_SYRINGE | INTRAVENOUS | Status: AC
  Filled 2021-01-08: qty 10

## 2021-01-08 MED ORDER — KETOROLAC TROMETHAMINE 15 MG/ML IJ SOLN
15.0000 mg | Freq: Four times a day (QID) | INTRAMUSCULAR | Status: DC
  Administered 2021-01-08 – 2021-01-09 (×3): 15 mg via INTRAVENOUS
  Filled 2021-01-08 (×2): qty 1

## 2021-01-08 MED ORDER — MORPHINE SULFATE (PF) 2 MG/ML IV SOLN
2.0000 mg | INTRAVENOUS | Status: DC | PRN
  Administered 2021-01-08: 4 mg via INTRAVENOUS
  Filled 2021-01-08: qty 2

## 2021-01-08 MED ORDER — ASPIRIN EC 81 MG PO TBEC
81.0000 mg | DELAYED_RELEASE_TABLET | Freq: Every day | ORAL | Status: DC
  Administered 2021-01-09: 81 mg via ORAL
  Filled 2021-01-08: qty 1

## 2021-01-08 MED ORDER — BUPIVACAINE-EPINEPHRINE (PF) 0.25% -1:200000 IJ SOLN
INTRAMUSCULAR | Status: DC | PRN
  Administered 2021-01-08: 30 mL

## 2021-01-08 MED ORDER — BACITRACIN-NEOMYCIN-POLYMYXIN 400-5-5000 EX OINT: 1.0000 "application " | TOPICAL_OINTMENT | Freq: Three times a day (TID) | CUTANEOUS | Status: DC | PRN

## 2021-01-08 MED ORDER — FENTANYL CITRATE (PF) 100 MCG/2ML IJ SOLN
INTRAMUSCULAR | Status: AC
  Filled 2021-01-08: qty 2

## 2021-01-08 MED ORDER — STERILE WATER FOR IRRIGATION IR SOLN
Status: DC | PRN
  Administered 2021-01-08: 1000 mL

## 2021-01-08 MED ORDER — ORAL CARE MOUTH RINSE
15.0000 mL | Freq: Once | OROMUCOSAL | Status: AC
  Administered 2021-01-08: 15 mL via OROMUCOSAL

## 2021-01-08 MED ORDER — CEFAZOLIN SODIUM-DEXTROSE 2-4 GM/100ML-% IV SOLN
2.0000 g | Freq: Once | INTRAVENOUS | Status: AC
  Administered 2021-01-08: 2 g via INTRAVENOUS
  Filled 2021-01-08: qty 100

## 2021-01-08 MED ORDER — ONDANSETRON HCL 4 MG/2ML IJ SOLN
INTRAMUSCULAR | Status: AC
  Filled 2021-01-08: qty 2

## 2021-01-08 MED ORDER — AMLODIPINE BESY-BENAZEPRIL HCL 10-40 MG PO CAPS: 1.0000 | ORAL_CAPSULE | Freq: Every day | ORAL | Status: DC

## 2021-01-08 MED ORDER — CEFAZOLIN SODIUM-DEXTROSE 1-4 GM/50ML-% IV SOLN
1.0000 g | Freq: Three times a day (TID) | INTRAVENOUS | Status: DC
  Filled 2021-01-08: qty 50

## 2021-01-08 MED ORDER — PROPOFOL 10 MG/ML IV BOLUS
INTRAVENOUS | Status: AC
  Filled 2021-01-08: qty 20

## 2021-01-08 MED ORDER — ROSUVASTATIN CALCIUM 20 MG PO TABS
40.0000 mg | ORAL_TABLET | Freq: Every day | ORAL | Status: DC
  Administered 2021-01-09: 40 mg via ORAL
  Filled 2021-01-08: qty 2

## 2021-01-08 MED ORDER — ACETAMINOPHEN 500 MG PO TABS
1000.0000 mg | ORAL_TABLET | Freq: Once | ORAL | Status: AC
  Administered 2021-01-08: 1000 mg via ORAL

## 2021-01-08 MED ORDER — ZOLPIDEM TARTRATE 5 MG PO TABS: 5.0000 mg | ORAL_TABLET | Freq: Every evening | ORAL | Status: DC | PRN

## 2021-01-08 MED ORDER — ONDANSETRON HCL 4 MG/2ML IJ SOLN
INTRAMUSCULAR | Status: DC | PRN
  Administered 2021-01-08: 4 mg via INTRAVENOUS

## 2021-01-08 MED ORDER — ONDANSETRON HCL 4 MG/2ML IJ SOLN: 4.0000 mg | INTRAMUSCULAR | Status: DC | PRN

## 2021-01-08 MED ORDER — PROPOFOL 10 MG/ML IV BOLUS
INTRAVENOUS | Status: DC | PRN
  Administered 2021-01-08: 150 mg via INTRAVENOUS

## 2021-01-08 MED ORDER — SUGAMMADEX SODIUM 200 MG/2ML IV SOLN
INTRAVENOUS | Status: DC | PRN
  Administered 2021-01-08: 200 mg via INTRAVENOUS

## 2021-01-08 MED ORDER — DOCUSATE SODIUM 100 MG PO CAPS
100.0000 mg | ORAL_CAPSULE | Freq: Two times a day (BID) | ORAL | Status: DC
  Administered 2021-01-08 – 2021-01-09 (×2): 100 mg via ORAL
  Filled 2021-01-08 (×2): qty 1

## 2021-01-08 MED ORDER — AMISULPRIDE (ANTIEMETIC) 5 MG/2ML IV SOLN: 10.0000 mg | Freq: Once | INTRAVENOUS | Status: DC | PRN

## 2021-01-08 MED ORDER — SULFAMETHOXAZOLE-TRIMETHOPRIM 800-160 MG PO TABS: 1.0000 | ORAL_TABLET | Freq: Two times a day (BID) | ORAL | 0 refills | Status: DC

## 2021-01-08 MED ORDER — BENAZEPRIL HCL 20 MG PO TABS
40.0000 mg | ORAL_TABLET | Freq: Every day | ORAL | Status: DC
  Administered 2021-01-09: 40 mg via ORAL
  Filled 2021-01-08: qty 2

## 2021-01-08 MED ORDER — AMLODIPINE BESYLATE 10 MG PO TABS
10.0000 mg | ORAL_TABLET | Freq: Every day | ORAL | Status: DC
  Administered 2021-01-09: 10 mg via ORAL
  Filled 2021-01-08: qty 1

## 2021-01-08 MED ORDER — LIDOCAINE 2% (20 MG/ML) 5 ML SYRINGE
INTRAMUSCULAR | Status: DC | PRN
  Administered 2021-01-08: 60 mg via INTRAVENOUS

## 2021-01-08 SURGICAL SUPPLY — 64 items
ADH SKN CLS APL DERMABOND .7 (GAUZE/BANDAGES/DRESSINGS) ×1
APL PRP STRL LF DISP 70% ISPRP (MISCELLANEOUS) ×1
APL SWBSTK 6 STRL LF DISP (MISCELLANEOUS) ×1
APPLICATOR COTTON TIP 6 STRL (MISCELLANEOUS) ×1 IMPLANT
APPLICATOR COTTON TIP 6IN STRL (MISCELLANEOUS) ×2
CATH FOLEY 2WAY SLVR 18FR 30CC (CATHETERS) ×2 IMPLANT
CATH ROBINSON RED A/P 16FR (CATHETERS) ×2 IMPLANT
CATH ROBINSON RED A/P 8FR (CATHETERS) ×2 IMPLANT
CATH TIEMANN FOLEY 18FR 5CC (CATHETERS) ×2 IMPLANT
CHLORAPREP W/TINT 26 (MISCELLANEOUS) ×2 IMPLANT
CLIP VESOLOCK LG 6/CT PURPLE (CLIP) ×4 IMPLANT
COVER SURGICAL LIGHT HANDLE (MISCELLANEOUS) ×2 IMPLANT
COVER TIP SHEARS 8 DVNC (MISCELLANEOUS) ×1 IMPLANT
COVER TIP SHEARS 8MM DA VINCI (MISCELLANEOUS) ×2
COVER WAND RF STERILE (DRAPES) IMPLANT
CUTTER ECHEON FLEX ENDO 45 340 (ENDOMECHANICALS) ×2 IMPLANT
DECANTER SPIKE VIAL GLASS SM (MISCELLANEOUS) ×2 IMPLANT
DERMABOND ADVANCED (GAUZE/BANDAGES/DRESSINGS) ×1
DERMABOND ADVANCED .7 DNX12 (GAUZE/BANDAGES/DRESSINGS) ×1 IMPLANT
DRAIN CHANNEL RND F F (WOUND CARE) IMPLANT
DRAPE ARM DVNC X/XI (DISPOSABLE) ×4 IMPLANT
DRAPE COLUMN DVNC XI (DISPOSABLE) ×1 IMPLANT
DRAPE DA VINCI XI ARM (DISPOSABLE) ×8
DRAPE DA VINCI XI COLUMN (DISPOSABLE) ×2
DRAPE SURG IRRIG POUCH 19X23 (DRAPES) ×2 IMPLANT
DRSG TEGADERM 4X4.75 (GAUZE/BANDAGES/DRESSINGS) ×2 IMPLANT
ELECT PENCIL ROCKER SW 15FT (MISCELLANEOUS) ×2 IMPLANT
ELECT REM PT RETURN 15FT ADLT (MISCELLANEOUS) ×2 IMPLANT
GLOVE SURG ENC MOIS LTX SZ6.5 (GLOVE) ×2 IMPLANT
GLOVE SURG ENC TEXT LTX SZ7.5 (GLOVE) ×4 IMPLANT
GOWN STRL REUS W/TWL LRG LVL3 (GOWN DISPOSABLE) ×6 IMPLANT
HOLDER FOLEY CATH W/STRAP (MISCELLANEOUS) ×2 IMPLANT
IRRIG SUCT STRYKERFLOW 2 WTIP (MISCELLANEOUS) ×2
IRRIGATION SUCT STRKRFLW 2 WTP (MISCELLANEOUS) ×1 IMPLANT
IV LACTATED RINGERS 1000ML (IV SOLUTION) ×2 IMPLANT
KIT TURNOVER KIT A (KITS) ×2 IMPLANT
NDL SAFETY ECLIPSE 18X1.5 (NEEDLE) ×1 IMPLANT
NEEDLE HYPO 18GX1.5 SHARP (NEEDLE) ×2
PACK ROBOT UROLOGY CUSTOM (CUSTOM PROCEDURE TRAY) ×2 IMPLANT
RELOAD STAPLE 45 4.1 GRN THCK (STAPLE) ×1 IMPLANT
SCISSORS LAP 5X35 DISP (ENDOMECHANICALS) ×1 IMPLANT
SEAL CANN UNIV 5-8 DVNC XI (MISCELLANEOUS) ×4 IMPLANT
SEAL XI 5MM-8MM UNIVERSAL (MISCELLANEOUS) ×8
SET TUBE SMOKE EVAC HIGH FLOW (TUBING) ×2 IMPLANT
SOLUTION ELECTROLUBE (MISCELLANEOUS) ×2 IMPLANT
STAPLE RELOAD 45 GRN (STAPLE) ×1 IMPLANT
STAPLE RELOAD 45MM GREEN (STAPLE) ×2
SUT ETHILON 3 0 PS 1 (SUTURE) ×2 IMPLANT
SUT MNCRL 3 0 RB1 (SUTURE) ×1 IMPLANT
SUT MNCRL 3 0 VIOLET RB1 (SUTURE) ×1 IMPLANT
SUT MNCRL AB 4-0 PS2 18 (SUTURE) ×4 IMPLANT
SUT MONOCRYL 3 0 RB1 (SUTURE) ×4
SUT VIC AB 0 CT1 27 (SUTURE) ×2
SUT VIC AB 0 CT1 27XBRD ANTBC (SUTURE) ×1 IMPLANT
SUT VIC AB 0 UR5 27 (SUTURE) ×2 IMPLANT
SUT VIC AB 2-0 SH 27 (SUTURE) ×2
SUT VIC AB 2-0 SH 27X BRD (SUTURE) ×1 IMPLANT
SUT VIC AB 3-0 SH 27 (SUTURE) ×2
SUT VIC AB 3-0 SH 27XBRD (SUTURE) IMPLANT
SUT VICRYL 0 UR6 27IN ABS (SUTURE) ×4 IMPLANT
SYR 27GX1/2 1ML LL SAFETY (SYRINGE) ×2 IMPLANT
TOWEL OR NON WOVEN STRL DISP B (DISPOSABLE) ×2 IMPLANT
TROCAR XCEL NON-BLD 5MMX100MML (ENDOMECHANICALS) IMPLANT
WATER STERILE IRR 1000ML POUR (IV SOLUTION) ×2 IMPLANT

## 2021-01-08 NOTE — Transfer of Care (Signed)
Immediate Anesthesia Transfer of Care Note  Patient: Joshua Curtis  Procedure(s) Performed: XI ROBOTIC ASSISTED LAPAROSCOPIC RADICAL PROSTATECTOMY LEVEL 3 (N/A )  Patient Location: PACU  Anesthesia Type:General  Level of Consciousness: sedated, patient cooperative and responds to stimulation  Airway & Oxygen Therapy: Patient Spontanous Breathing and Patient connected to face mask oxygen  Post-op Assessment: Report given to RN and Post -op Vital signs reviewed and stable  Post vital signs: Reviewed and stable  Last Vitals:  Vitals Value Taken Time  BP 122/70 01/08/21 1418  Temp    Pulse 82 01/08/21 1421  Resp 19 01/08/21 1421  SpO2 100 % 01/08/21 1421  Vitals shown include unvalidated device data.  Last Pain:  Vitals:   01/08/21 0950  TempSrc: Oral         Complications: No complications documented.

## 2021-01-08 NOTE — Anesthesia Procedure Notes (Signed)
Procedure Name: Intubation Performed by: Gean Maidens, CRNA Pre-anesthesia Checklist: Patient identified, Emergency Drugs available, Suction available, Patient being monitored and Timeout performed Patient Re-evaluated:Patient Re-evaluated prior to induction Oxygen Delivery Method: Circle system utilized Preoxygenation: Pre-oxygenation with 100% oxygen Induction Type: IV induction Ventilation: Mask ventilation without difficulty Laryngoscope Size: Mac and 4 Grade View: Grade I Tube type: Oral Tube size: 7.5 mm Number of attempts: 1 Airway Equipment and Method: Stylet Placement Confirmation: ETT inserted through vocal cords under direct vision,  positive ETCO2 and breath sounds checked- equal and bilateral Secured at: 23 cm Tube secured with: Tape Dental Injury: Teeth and Oropharynx as per pre-operative assessment

## 2021-01-08 NOTE — Progress Notes (Signed)
Patient still too sleepy to ambulate

## 2021-01-08 NOTE — Progress Notes (Signed)
Patient ID: Joshua Curtis, male   DOB: [birth year], 70 y.o.   MRN: [id number]  Post-op note  Subjective: The patient is doing well.  No complaints.  Objective: Vital signs in last 24 hours: Temp:  [96.9 F (36.1 C)-98.1 F (36.7 C)] 96.9 F (36.1 C) (05/09 1500) Pulse Rate:  [64-70] 64 (05/09 1445) Resp:  [16-25] 16 (05/09 1530) BP: (117-131)/(62-75) 117/75 (05/09 1530) SpO2:  [100 %] 100 % (05/09 1530)  Intake/Output from previous day: No intake/output data recorded. Intake/Output this shift: Total I/O In: 1600 [I.V.:1500; IV Piggyback:100] Out: 100 [Blood:100]  Physical Exam:  General: Alert and oriented. Abdomen: Soft, Nondistended. Incisions: Clean and dry. GU: Urine clearing.  Lab Results: Recent Labs    01/08/21 1454  HGB 10.8*  HCT 33.5*    Assessment/Plan: POD#0   1) Continue to monitor, ambulate, IS   Pryor Curia. MD   LOS: 0 days   Dutch Gray 01/08/2021, 3:44 PM

## 2021-01-08 NOTE — Interval H&P Note (Signed)
History and Physical Interval Note:  01/08/2021 10:12 AM  Joshua Curtis  has presented today for surgery, with the diagnosis of PROSTATE CANCER.  The various methods of treatment have been discussed with the patient and family. After consideration of risks, benefits and other options for treatment, the patient has consented to  Procedure(s): XI ROBOTIC [place] 3 (N/A) as a surgical intervention.  The patient's history has been reviewed, patient examined, no change in status, stable for surgery.  I have reviewed the patient's chart and labs.  Questions were answered to the patient's satisfaction.     [employer]

## 2021-01-08 NOTE — Op Note (Signed)
Preoperative diagnosis: Clinically localized adenocarcinoma of the prostate (clinical stage T1c Nx Mx)  Postoperative diagnosis: Clinically localized adenocarcinoma of the prostate (clinical stage T1c Nx Mx)  Procedure:  1. Robotic assisted laparoscopic radical prostatectomy (bilateral nerve sparing) 2. Bilateral robotic assisted laparoscopic pelvic lymphadenectomy  Surgeon: Pryor Curia. M.D.  Assistant: Debbrah Alar, PA-C  An assistant was required for this surgical procedure.  The duties of the assistant included but were not limited to suctioning, passing suture, camera manipulation, retraction. This procedure would not be able to be performed without an Environmental consultant.  Anesthesia: General  Complications: None  EBL: 100 mL  IVF:  1500 mL crystalloid  Specimens: 1. Prostate and seminal vesicles 2. Right pelvic lymph nodes 3. Left pelvic lymph nodes  Disposition of specimens: Pathology  Drains: 1. 20 Fr coude catheter 2. # 19 Blake pelvic drain  Indication: Joshua Curtis is a 70 y.o. year old patient with clinically localized prostate cancer.  After a thorough review of the management options for treatment of prostate cancer, he elected to proceed with surgical therapy and the above procedure(s).  We have discussed the potential benefits and risks of the procedure, side effects of the proposed treatment, the likelihood of the patient achieving the goals of the procedure, and any potential problems that might occur during the procedure or recuperation. Informed consent has been obtained.  Description of procedure:  The patient was taken to the operating room and a general anesthetic was administered. He was given preoperative antibiotics, placed in the dorsal lithotomy position, and prepped and draped in the usual sterile fashion. Next a preoperative timeout was performed. A urethral catheter was placed into the bladder and a site was selected near the umbilicus for  placement of the camera port. This was placed using a standard open Hassan technique which allowed entry into the peritoneal cavity under direct vision and without difficulty. An 8 mm robotic port was placed and a pneumoperitoneum established. The camera was then used to inspect the abdomen and there was no evidence of any intra-abdominal injuries or other abnormalities. The remaining abdominal ports were then placed. 8 mm robotic ports were placed in the right lower quadrant, left lower quadrant, and far left lateral abdominal wall. A 5 mm port was placed in the right upper quadrant and a 12 mm port was placed in the right lateral abdominal wall for laparoscopic assistance. All ports were placed under direct vision without difficulty. The surgical cart was then docked.   Utilizing the cautery scissors, the bladder was reflected posteriorly allowing entry into the space of Retzius and identification of the endopelvic fascia and prostate. The periprostatic fat was then removed from the prostate allowing full exposure of the endopelvic fascia. The endopelvic fascia was then incised from the apex back to the base of the prostate bilaterally and the underlying levator muscle fibers were swept laterally off the prostate thereby isolating the dorsal venous complex. The dorsal vein was then stapled and divided with a 45 mm Flex Echelon stapler. Attention then turned to the bladder neck which was divided anteriorly thereby allowing entry into the bladder and exposure of the urethral catheter. The catheter balloon was deflated and the catheter was brought into the operative field and used to retract the prostate anteriorly. The posterior bladder neck was then examined.  There was a very large intravesical median lobe as noted on his preoperative ultrasound.  It was able to be lifted anteriorly and indigo carmine was given to allow  identification of the ureters.  The posterior bladder neck was then divided just below the  median lobe as the median lobe was retracted anteriorly. This allowed further dissection between the bladder and prostate posteriorly until the vasa deferentia and seminal vessels were identified. The vasa deferentia were isolated, divided, and lifted anteriorly. The seminal vesicles were dissected down to their tips with care to control the seminal vascular arterial blood supply. These structures were then lifted anteriorly and the space between Denonvillier's fascia and the anterior rectum was developed with a combination of sharp and blunt dissection. This isolated the vascular pedicles of the prostate.  The lateral prostatic fascia was then sharply incised allowing release of the neurovascular bundles bilaterally. The vascular pedicles of the prostate were then ligated with Weck clips between the prostate and neurovascular bundles and divided with sharp cold scissor dissection resulting in neurovascular bundle preservation. The neurovascular bundles were then separated off the apex of the prostate and urethra bilaterally.  The urethra was then sharply transected allowing the prostate specimen to be disarticulated. The pelvis was copiously irrigated and hemostasis was ensured. There was no evidence for rectal injury.  Attention then turned to the right pelvic sidewall. The fibrofatty tissue between the external iliac vein, confluence of the iliac vessels, hypogastric artery, and Cooper's ligament was dissected free from the pelvic sidewall with care to preserve the obturator nerve. Weck clips were used for lymphostasis and hemostasis. An identical procedure was performed on the contralateral side and the lymphatic packets were removed for permanent pathologic analysis.  Attention then turned to the urethral anastomosis.  The bladder neck did require reconstruction and this was performed with a 3-0 vicryl in a tennis racket fashion.  A 2-0 Vicryl slip knot was placed between Denonvillier's fascia, the  posterior bladder neck, and the posterior urethra to reapproximate these structures. A double-armed 3-0 Monocryl suture was then used to perform a 360 running tension-free anastomosis between the bladder neck and urethra. A new urethral catheter was then placed into the bladder and irrigated. There were no blood clots within the bladder and the anastomosis appeared to be watertight. A #19 Blake drain was then brought through the left lateral 8 mm port site and positioned appropriately within the pelvis. It was secured to the skin with a nylon suture. The surgical cart was then undocked. The right lateral 12 mm port site was closed at the fascial level with a 0 Vicryl suture placed laparoscopically. All remaining ports were then removed under direct vision. The prostate specimen was removed intact within the Endopouch retrieval bag via the periumbilical camera port site. This fascial opening was closed with two running 0 Vicryl sutures. 0.25% Marcaine was then injected into all port sites and all incisions were reapproximated at the skin level with 4-0 Monocryl subcuticular sutures and Dermabond. The patient appeared to tolerate the procedure well and without complications. The patient was able to be extubated and transferred to the recovery unit in satisfactory condition.   Pryor Curia MD

## 2021-01-08 NOTE — Discharge Instructions (Signed)
1. Activity:  You are encouraged to ambulate frequently (about every hour during waking hours) to help prevent blood clots from forming in your legs or lungs.  However, you should not engage in any heavy lifting (> 10-15 lbs), strenuous activity, or straining. 2. Diet: You should continue a clear liquid diet until passing gas from below.  Once this occurs, you may advance your diet to a soft diet that would be easy to digest (i.e soups, scrambled eggs, mashed potatoes, etc.) for 24 hours just as you would if getting over a bad stomach flu.  If tolerating this diet well for 24 hours, you may then begin eating regular food.  It will be normal to have some amount of bloating, nausea, and abdominal discomfort intermittently. 3. Prescriptions:  You will be provided a prescription for pain medication to take as needed.  If your pain is not severe enough to require the prescription pain medication, you may take Tylenol instead.  You should also take an over the counter stool softener (Colace 100 mg twice daily) to avoid straining with bowel movements as the pain medication may constipate you. Finally, you will also be provided a prescription for an antibiotic to begin the day prior to your return visit in the office for catheter removal. 4. Catheter care: You will be taught how to take care of the catheter by the nursing staff prior to discharge from the hospital.  You may use both a leg bag and the larger bedside bag but it is recommended to at least use the bigger bedside bag at nighttime as the leg bag is small and will fill up overnight and also does not drain as well when lying flat. You may periodically feel a strong urge to void with the catheter in place.  This is a bladder spasm and most often can occur when having a bowel movement or when you are moving around. It is typically self-limited and usually will stop after a few minutes.  You may use some Vaseline or Neosporin around the tip of the catheter to  reduce friction at the tip of the penis. 5. Incisions: You may remove your dressing bandages the 2nd day after surgery.  You most likely will have a few small staples in each of the incisions and once the bandages are removed, the incisions may stay open to air.  You may start showering (not soaking or bathing in water) 48 hours after surgery and the incisions simply need to be patted dry after the shower.  No additional care is needed. 6. What to call us about: You should call the office [phone]) if you develop fever > 101, persistent vomiting, or the catheter stops draining. Also, feel free to call with any other questions you may have and remember the handout that was provided to you as a reference preoperatively which answers many of the common questions that arise after surgery. 7. You may resume advil, aleve, vitamins, and supplements 7 days after surgery.   You may resume Plavix 1 week after surgery.

## 2021-01-08 NOTE — Anesthesia Postprocedure Evaluation (Signed)
Anesthesia Post Note  Patient: Joshua Curtis  Procedure(s) Performed: XI ROBOTIC ASSISTED LAPAROSCOPIC RADICAL PROSTATECTOMY LEVEL 3 (N/A )     Patient location during evaluation: PACU Anesthesia Type: General Level of consciousness: awake and alert Pain management: pain level controlled Vital Signs Assessment: post-procedure vital signs reviewed and stable Respiratory status: spontaneous breathing, nonlabored ventilation, respiratory function stable and patient connected to nasal cannula oxygen Cardiovascular status: blood pressure returned to baseline and stable Postop Assessment: no apparent nausea or vomiting Anesthetic complications: no   No complications documented.  Last Vitals:  Vitals:   01/08/21 1515 01/08/21 1530  BP: 123/66 117/75  Pulse:    Resp: 18 16  Temp:    SpO2: 100% 100%    Last Pain:  Vitals:   01/08/21 1530  TempSrc:   PainSc: [name]

## 2021-01-09 ENCOUNTER — Encounter: Payer: Self-pay | Admitting: *Deleted

## 2021-01-09 ENCOUNTER — Encounter (HOSPITAL_COMMUNITY): Payer: Self-pay | Admitting: Urology

## 2021-01-09 DIAGNOSIS — I10 Essential (primary) hypertension: Secondary | ICD-10-CM | POA: Diagnosis not present

## 2021-01-09 DIAGNOSIS — Z79899 Other long term (current) drug therapy: Secondary | ICD-10-CM | POA: Diagnosis not present

## 2021-01-09 DIAGNOSIS — F17210 Nicotine dependence, cigarettes, uncomplicated: Secondary | ICD-10-CM | POA: Diagnosis not present

## 2021-01-09 DIAGNOSIS — C61 Malignant neoplasm of prostate: Secondary | ICD-10-CM | POA: Diagnosis not present

## 2021-01-09 DIAGNOSIS — Z7982 Long term (current) use of aspirin: Secondary | ICD-10-CM | POA: Diagnosis not present

## 2021-01-09 LAB — HEMOGLOBIN AND HEMATOCRIT, BLOOD
HCT: 33.7 % — ABNORMAL LOW (ref 39.0–52.0)
Hemoglobin: 11.1 g/dL — ABNORMAL LOW (ref 13.0–17.0)

## 2021-01-09 MED ORDER — BISACODYL 10 MG RE SUPP
10.0000 mg | Freq: Once | RECTAL | Status: AC
  Administered 2021-01-09: 10 mg via RECTAL
  Filled 2021-01-09: qty 1

## 2021-01-09 MED ORDER — CHLORHEXIDINE GLUCONATE CLOTH 2 % EX PADS
6.0000 | MEDICATED_PAD | Freq: Every day | CUTANEOUS | Status: DC
  Administered 2021-01-09: 6 via TOPICAL

## 2021-01-09 MED ORDER — TRAMADOL HCL 50 MG PO TABS
50.0000 mg | ORAL_TABLET | Freq: Four times a day (QID) | ORAL | Status: DC | PRN
  Administered 2021-01-09: 50 mg via ORAL
  Filled 2021-01-09: qty 1

## 2021-01-09 NOTE — Discharge Summary (Signed)
Date of admission: 01/08/2021  Date of discharge: 01/09/2021  Admission diagnosis: Prostate Cancer  Discharge diagnosis: Prostate Cancer  History and Physical: For full details, please see admission history and physical. Briefly, Joshua Curtis is a 70 y.o. gentleman with localized prostate cancer.  After discussing management/treatment options, he elected to proceed with surgical treatment.  Hospital Course: Joshua Curtis was taken to the operating room on 01/08/2021 and underwent a robotic assisted laparoscopic radical prostatectomy. He tolerated this procedure well and without complications. Postoperatively, he was able to be transferred to a regular hospital room following recovery from anesthesia.  He was able to begin ambulating the night of surgery. He remained hemodynamically stable overnight.  He had excellent urine output with appropriately minimal output from his pelvic drain and his pelvic drain was removed on POD #1.  He was transitioned to oral pain medication, tolerated a clear liquid diet, and had met all discharge criteria and was able to be discharged home later on POD#1.  Laboratory values: Recent Labs    01/08/21 1454 01/09/21 0406  HGB 10.8* 11.1*  HCT 33.5* 33.7*    Disposition: Home  Discharge instruction: He was instructed to be ambulatory but to refrain from heavy lifting, strenuous activity, or driving. He was instructed on urethral catheter care.  Discharge medications:   Allergies as of 01/09/2021   No Known Allergies     Medication List    STOP taking these medications   clopidogrel 75 MG tablet Commonly known as: Plavix   meloxicam 15 MG tablet Commonly known as: MOBIC   tamsulosin 0.4 MG Caps capsule Commonly known as: FLOMAX     TAKE these medications   amLODipine-benazepril 10-40 MG capsule Commonly known as: LOTREL TAKE 1 CAPSULE BY MOUTH  DAILY   aspirin EC 81 MG tablet Take 81 mg by mouth daily. Swallow whole.   ferrous sulfate 325  (65 FE) MG tablet Take 1 tablet (325 mg total) by mouth daily with breakfast.   hydrochlorothiazide 12.5 MG capsule Commonly known as: MICROZIDE TAKE 1 CAPSULE BY MOUTH  DAILY   rosuvastatin 10 MG tablet Commonly known as: CRESTOR Take 10 mg by mouth daily.   rosuvastatin 40 MG tablet Commonly known as: Crestor Take 1 tablet (40 mg total) by mouth daily.   sulfamethoxazole-trimethoprim 800-160 MG tablet Commonly known as: BACTRIM DS Take 1 tablet by mouth 2 (two) times daily. Start the day prior to foley removal appointment   traMADol 50 MG tablet Commonly known as: Ultram Take 1-2 tablets (50-100 mg total) by mouth every 6 (six) hours as needed for moderate pain or severe pain.       Followup: He will followup in 1 week for catheter removal and to discuss his surgical pathology results.

## 2021-01-09 NOTE — Progress Notes (Signed)
Patient ID: Joshua Curtis, male   DOB: 04-09-1951, 70 y.o.   MRN: [id number]  1 Day Post-Op Subjective: The patient is doing well.  No nausea or vomiting. Pain is adequately controlled.  Objective: Vital signs in last 24 hours: Temp:  [96.4 F (35.8 C)-98.7 F (37.1 C)] 98.7 F (37.1 C) (05/10 0422) Pulse Rate:  [62-78] 73 (05/10 0422) Resp:  [11-25] 16 (05/10 0422) BP: (117-149)/(62-77) 131/71 (05/10 0422) SpO2:  [92 %-100 %] 95 % (05/10 0422) Weight:  [73.5 kg] 73.5 kg (05/09 1615)  Intake/Output from previous day: 05/09 0701 - 05/10 0700 In: 4363.9 [P.O.:610; I.V.:3453.9; IV Piggyback:300] Out: 2543 [Urine:2150; Drains:293; Blood:100] Intake/Output this shift: No intake/output data recorded.  Physical Exam:  General: Alert and oriented. CV: RRR Lungs: Clear bilaterally. GI: Soft, Nondistended. Incisions: Clean, dry, and intact Urine: Clear Extremities: Nontender, no erythema, no edema.  Lab Results: Recent Labs    01/08/21 1454 01/09/21 0406  HGB 10.8* 11.1*  HCT 33.5* 33.7*      Assessment/Plan: POD# 1 s/p robotic prostatectomy.  1) SL IVF 2) Ambulate, Incentive spirometry 3) Transition to oral pain medication 4) Dulcolax suppository 5) D/C pelvic drain 6) Plan for likely discharge later today   Joshua Curtis. MD   LOS: 0 days   Joshua Curtis 01/09/2021, 7:45 AM

## 2021-01-09 NOTE — Progress Notes (Signed)
Patient discharge home with wife, discharge instructions given and explained to patient/wife, demonstrated foley/incsion care/management at home care, they verbalized understanding. Surgical incision clean/dry/intact, no other wound noted. Patient denies any pain/distress. Accompanied home by wife.

## 2021-01-09 NOTE — Plan of Care (Signed)
  Problem: Urinary Elimination: Goal: Ability to avoid or minimize complications of infection will improve Outcome: Completed/Met

## 2021-01-14 LAB — SURGICAL PATHOLOGY

## 2021-02-02 ENCOUNTER — Ambulatory Visit
Admission: RE | Admit: 2021-02-02 | Discharge: 2021-02-02 | Disposition: A | Discharge status: Home / Self Care | Payer: BC Managed Care – PPO | Source: Ambulatory Visit | Attending: Cardiology | Admitting: Cardiology

## 2021-02-02 DIAGNOSIS — I2510 Atherosclerotic heart disease of native coronary artery without angina pectoris: Secondary | ICD-10-CM | POA: Diagnosis not present

## 2021-02-02 DIAGNOSIS — J432 Centrilobular emphysema: Secondary | ICD-10-CM | POA: Diagnosis not present

## 2021-02-02 DIAGNOSIS — I712 Thoracic aortic aneurysm, without rupture, unspecified: Secondary | ICD-10-CM

## 2021-02-02 DIAGNOSIS — I7789 Other specified disorders of arteries and arterioles: Secondary | ICD-10-CM | POA: Diagnosis not present

## 2021-02-02 MED ORDER — IOPAMIDOL (ISOVUE-370) INJECTION 76%
75.0000 mL | Freq: Once | INTRAVENOUS | Status: AC | PRN
  Administered 2021-02-02: 75 mL via INTRAVENOUS

## 2021-02-05 ENCOUNTER — Encounter: Payer: Self-pay | Admitting: *Deleted

## 2021-02-06 DIAGNOSIS — M6281 Muscle weakness (generalized): Secondary | ICD-10-CM | POA: Diagnosis not present

## 2021-02-06 DIAGNOSIS — N393 Stress incontinence (female) (male): Secondary | ICD-10-CM | POA: Diagnosis not present

## 2021-02-06 DIAGNOSIS — M62838 Other muscle spasm: Secondary | ICD-10-CM | POA: Diagnosis not present

## 2021-03-02 ENCOUNTER — Telehealth: Payer: Self-pay | Admitting: Podiatry

## 2021-03-02 NOTE — Telephone Encounter (Signed)
I was just wondering if I need to do anything as far as Mr. Joshua Curtis disability. He was scheduled on our end to RTW 02/21/2021. I tried to call him today, I will try again next week, but I just wanted to check in with you.

## 2021-03-06 DIAGNOSIS — N393 Stress incontinence (female) (male): Secondary | ICD-10-CM | POA: Diagnosis not present

## 2021-03-06 DIAGNOSIS — M6281 Muscle weakness (generalized): Secondary | ICD-10-CM | POA: Diagnosis not present

## 2021-03-06 DIAGNOSIS — M62838 Other muscle spasm: Secondary | ICD-10-CM | POA: Diagnosis not present

## 2021-03-07 ENCOUNTER — Ambulatory Visit (INDEPENDENT_AMBULATORY_CARE_PROVIDER_SITE_OTHER): Payer: BC Managed Care – PPO | Admitting: Internal Medicine

## 2021-03-07 ENCOUNTER — Ambulatory Visit: Payer: BC Managed Care – PPO | Admitting: Internal Medicine

## 2021-03-07 ENCOUNTER — Other Ambulatory Visit: Payer: Self-pay

## 2021-03-07 ENCOUNTER — Encounter: Payer: Self-pay | Admitting: Internal Medicine

## 2021-03-07 VITALS — BP 126/62 | HR 86 | Temp 98.1°F | Ht 70.0 in | Wt 158.0 lb

## 2021-03-07 DIAGNOSIS — C61 Malignant neoplasm of prostate: Secondary | ICD-10-CM

## 2021-03-07 DIAGNOSIS — Z23 Encounter for immunization: Secondary | ICD-10-CM

## 2021-03-07 DIAGNOSIS — E538 Deficiency of other specified B group vitamins: Secondary | ICD-10-CM

## 2021-03-07 DIAGNOSIS — I10 Essential (primary) hypertension: Secondary | ICD-10-CM

## 2021-03-07 DIAGNOSIS — F17200 Nicotine dependence, unspecified, uncomplicated: Secondary | ICD-10-CM

## 2021-03-07 DIAGNOSIS — D509 Iron deficiency anemia, unspecified: Secondary | ICD-10-CM | POA: Diagnosis not present

## 2021-03-07 DIAGNOSIS — R739 Hyperglycemia, unspecified: Secondary | ICD-10-CM | POA: Diagnosis not present

## 2021-03-07 DIAGNOSIS — E7800 Pure hypercholesterolemia, unspecified: Secondary | ICD-10-CM

## 2021-03-07 DIAGNOSIS — Z0001 Encounter for general adult medical examination with abnormal findings: Secondary | ICD-10-CM | POA: Diagnosis not present

## 2021-03-07 DIAGNOSIS — E559 Vitamin D deficiency, unspecified: Secondary | ICD-10-CM

## 2021-03-07 LAB — CBC WITH DIFFERENTIAL/PLATELET
Basophils Absolute: 0.1 10*3/uL (ref 0.0–0.1)
Basophils Relative: 1.2 % (ref 0.0–3.0)
Eosinophils Absolute: 0.3 10*3/uL (ref 0.0–0.7)
Eosinophils Relative: 4.6 % (ref 0.0–5.0)
HCT: 30.4 % — ABNORMAL LOW (ref 39.0–52.0)
Hemoglobin: 10.1 g/dL — ABNORMAL LOW (ref 13.0–17.0)
Lymphocytes Relative: 21 % (ref 12.0–46.0)
Lymphs Abs: 1.4 10*3/uL (ref 0.7–4.0)
MCHC: 33.2 g/dL (ref 30.0–36.0)
MCV: 95.7 fl (ref 78.0–100.0)
Monocytes Absolute: 0.6 10*3/uL (ref 0.1–1.0)
Monocytes Relative: 8.9 % (ref 3.0–12.0)
Neutro Abs: 4.2 10*3/uL (ref 1.4–7.7)
Neutrophils Relative %: 64.3 % (ref 43.0–77.0)
Platelets: 384 10*3/uL (ref 150.0–400.0)
RBC: 3.17 Mil/uL — ABNORMAL LOW (ref 4.22–5.81)
RDW: 16.5 % — ABNORMAL HIGH (ref 11.5–15.5)
WBC: 6.6 10*3/uL (ref 4.0–10.5)

## 2021-03-07 LAB — URINALYSIS, ROUTINE W REFLEX MICROSCOPIC
Bilirubin Urine: NEGATIVE
Ketones, ur: NEGATIVE
Nitrite: NEGATIVE
Specific Gravity, Urine: <=1.005 — AB (ref 1.000–1.030)
Total Protein, Urine: NEGATIVE
Urine Glucose: NEGATIVE
Urobilinogen, UA: 0.2 (ref 0.0–1.0)
pH: 6 (ref 5.0–8.0)

## 2021-03-07 LAB — BASIC METABOLIC PANEL
BUN: 24 mg/dL — ABNORMAL HIGH (ref 6–23)
CO2: 26 mEq/L (ref 19–32)
Calcium: 8.9 mg/dL (ref 8.4–10.5)
Chloride: 103 mEq/L (ref 96–112)
Creatinine, Ser: 1.18 mg/dL (ref 0.40–1.50)
GFR: 63.65 mL/min (ref >60.00)
Glucose, Bld: 95 mg/dL (ref 70–99)
Potassium: 4.1 mEq/L (ref 3.5–5.1)
Sodium: 136 mEq/L (ref 135–145)

## 2021-03-07 LAB — LIPID PANEL
Cholesterol: 97 mg/dL (ref 0–200)
HDL: 39.9 mg/dL (ref >39.00)
LDL Cholesterol: 45 mg/dL (ref 0–99)
NonHDL: 57.17
Total CHOL/HDL Ratio: 2
Triglycerides: 63 mg/dL (ref 0.0–149.0)
VLDL: 12.6 mg/dL (ref 0.0–40.0)

## 2021-03-07 LAB — TSH: TSH: 1.14 u[IU]/mL (ref 0.35–5.50)

## 2021-03-07 LAB — HEPATIC FUNCTION PANEL
ALT: 8 U/L (ref 0–53)
AST: 12 U/L (ref 0–37)
Albumin: 4.2 g/dL (ref 3.5–5.2)
Alkaline Phosphatase: 49 U/L (ref 39–117)
Bilirubin, Direct: 0 mg/dL (ref 0.0–0.3)
Total Bilirubin: 0.2 mg/dL (ref 0.2–1.2)
Total Protein: 6.5 g/dL (ref 6.0–8.3)

## 2021-03-07 LAB — VITAMIN B12: Vitamin B-12: 220 pg/mL (ref 211–911)

## 2021-03-07 LAB — IBC PANEL
Iron: 387 ug/dL — ABNORMAL HIGH (ref 42–165)
Saturation Ratios: 91.5 % — ABNORMAL HIGH (ref 20.0–50.0)
Transferrin: 302 mg/dL (ref 212.0–360.0)

## 2021-03-07 LAB — FERRITIN: Ferritin: 17.1 ng/mL — ABNORMAL LOW (ref 22.0–322.0)

## 2021-03-07 LAB — VITAMIN D 25 HYDROXY (VIT D DEFICIENCY, FRACTURES): VITD: 11.44 ng/mL — ABNORMAL LOW (ref 30.00–100.00)

## 2021-03-07 LAB — PSA: PSA: 0 ng/mL — ABNORMAL LOW (ref 0.10–4.00)

## 2021-03-07 LAB — HEMOGLOBIN A1C: Hgb A1c MFr Bld: 4.8 % (ref 4.6–6.5)

## 2021-03-07 NOTE — Patient Instructions (Addendum)
Please take OTC Vitamin D3 at 2000 units per day, indefinitely  Please call your insurance to check on the Shingrix coverage, and come here or [facility] to have the shot if covered  You had the Pneumovax pneumonia shot today  Please continue all other medications as before, and refills have been done if requested.  Please have the pharmacy call with any other refills you may need.  Please continue your efforts at being more active, low cholesterol diet, and weight control.  You are otherwise up to date with prevention measures today.  Please keep your appointments with your specialists as you may have planned  - Dr Jacqualyn Posey for the right great toe  Please go to the LAB at the blood drawing area for the tests to be done  You will be contacted by phone if any changes need to be made immediately.  Otherwise, you will receive a letter about your results with an explanation, but please check with MyChart first.  Please remember to sign up for MyChart if you have not done so, as this will be important to you in the future with finding out test results, communicating by private email, and scheduling acute appointments online when needed.  Please make an Appointment to return in 6 months, or sooner if needed

## 2021-03-07 NOTE — Assessment & Plan Note (Signed)
Lab Results  Component Value Date   HGBA1C 4.8 03/07/2021   Stable, pt to continue current medical treatment  - diet

## 2021-03-07 NOTE — Progress Notes (Signed)
Patient ID: Joshua Curtis, male   DOB: 05/21/1951, 70 y.o.   MRN: [id number]         Chief Complaint:: wellness exam and Follow-up         HPI:  Joshua Curtis is a 70 y.o. male here for wellness exam; due for pneumovax, declines covid booster, and shingrix, o/w up to date with preventive referrals and immunizations                        Also c/o chronic right great toe wound almost healed, now worsening again, and has plans to follow with podiatry.  Had prostatectomy may 9 with prostate Cancer and only slight incontinence resulting, so he is pleased overall with results.  Admits to quit tobacco from may 5 preop to July 4 then unfortunately started again at the family gathering, but plans to try to quit again.  Has no overt bleeding, did require 1u PRBC about nov 2021 with iron deficiency anemia and Hgb 7.7 with symptomatic LE vascular claudication.  Pt denies chest pain, increased sob or doe, wheezing, orthopnea, PND, increased LE swelling, palpitations, dizziness or syncope.   Pt denies polydipsia, polyuria, or new focal neuro s/s.   Pt denies fever, wt loss, night sweats, loss of appetite, or other constitutional symptoms  Not taking vit d.   Wt Readings from Last 3 Encounters:  03/07/21 158 lb (71.7 kg)  01/08/21 162 lb (73.5 kg)  01/03/21 159 lb (72.1 kg)   BP Readings from Last 3 Encounters:  03/07/21 126/62  01/09/21 123/72  12/25/20 (!) 112/58   Immunization History  Administered Date(s) Administered   Fluad Quad(high Dose 65+) 04/21/2019, 09/06/2020   Influenza Split 06/21/2011   Influenza, High Dose Seasonal PF 10/03/2015, 10/16/2018   Influenza, Seasonal, Injecte, Preservative Fre 09/25/2012   Influenza,inj,Quad PF,6+ Mos 09/28/2013, 09/29/2014   PFIZER(Purple Top)SARS-COV-2 Vaccination 11/13/2019, 12/08/2019, 07/28/2020   Pneumococcal Conjugate-13 10/16/2018   Pneumococcal Polysaccharide-23 03/07/2021   Td 03/14/2010   Tdap 09/06/2020   There are no preventive care  reminders to display for this patient.     Past Medical History:  Diagnosis Date   Anemia, iron deficiency 04/02/2016   Aortic atherosclerosis (HCC)    Arthritis    rt knee   BPH (benign prostatic hyperplasia) 04/10/2017   Cancer ([place])    prostate   COLONIC POLYPS, HX OF 11/24/2007   COPD (chronic obstructive pulmonary disease) (HCC)    pt denies this- no respiratory issues of any kind per pt   Coronary artery calcification seen on CAT scan    DIVERTICULOSIS, COLON 11/24/2007   ED (erectile dysfunction)    GERD (gastroesophageal reflux disease)    HLD (hyperlipidemia) 09/06/2020   HYPERTENSION 11/24/2007   Increased prostate specific antigen (PSA) velocity 06/22/2011   PAH (pulmonary artery hypertension) ([place])    Peripheral vascular disease ([place])    Past Surgical History:  Procedure Laterality Date   ABDOMINAL AORTOGRAM W/LOWER EXTREMITY N/A 07/04/2020   Procedure: ABDOMINAL AORTOGRAM W/LOWER EXTREMITY;  Surgeon: Serafina Mitchell, MD;  Location: [facility];  Service: Cardiovascular;  Laterality: N/A;   ABDOMINAL AORTOGRAM W/LOWER EXTREMITY N/A 07/18/2020   Procedure: ABDOMINAL AORTOGRAM W/LOWER EXTREMITY;  Surgeon: Serafina Mitchell, MD;  Location: [facility];  Service: Cardiovascular;  Laterality: N/A;   COLONOSCOPY     NO PAST SURGERIES     PERIPHERAL VASCULAR BALLOON ANGIOPLASTY Right 07/18/2020   Procedure: PERIPHERAL VASCULAR BALLOON ANGIOPLASTY;  Surgeon: Serafina Mitchell, MD;  Location: [facility];  Service: Cardiovascular;  Laterality: Right;  tibioperoneal trunk and posterior tibial   PERIPHERAL VASCULAR INTERVENTION  07/04/2020   Procedure: PERIPHERAL VASCULAR INTERVENTION;  Surgeon: Serafina Mitchell, MD;  Location: [facility];  Service: Cardiovascular;;  Lt. SFA and Popliteal   PERIPHERAL VASCULAR INTERVENTION Right 07/18/2020   Procedure: PERIPHERAL VASCULAR INTERVENTION;  Surgeon: Serafina Mitchell, MD;  Location: [facility];  Service:  Cardiovascular;  Laterality: Right;  Femoral Popliteal   ROBOT ASSISTED LAPAROSCOPIC RADICAL PROSTATECTOMY N/A 01/08/2021   Procedure: XI ROBOTIC ASSISTED LAPAROSCOPIC RADICAL PROSTATECTOMY LEVEL 3;  Surgeon: Raynelle Bring, MD;  Location: [facility];  Service: Urology;  Laterality: N/A;    reports that he has been smoking cigarettes. He has been smoking an average of 0.25 packs per day. He has never used smokeless tobacco. He reports previous alcohol use. He reports that he does not use drugs. family history includes Anuerysm in his brother; Diabetes in his father and paternal grandfather; Hypertension in his sister; Kidney failure in his brother; Stroke in his mother. No Known Allergies Current Outpatient Medications on File Prior to Visit  Medication Sig Dispense Refill   amLODipine-benazepril (LOTREL) 10-40 MG capsule TAKE 1 CAPSULE BY MOUTH  DAILY 90 capsule 3   aspirin EC 81 MG tablet Take 81 mg by mouth daily. Swallow whole.     ferrous sulfate 325 (65 FE) MG tablet Take 1 tablet (325 mg total) by mouth daily with breakfast. 90 tablet 0   hydrochlorothiazide (MICROZIDE) 12.5 MG capsule TAKE 1 CAPSULE BY MOUTH  DAILY (Patient taking differently: Take 12.5 mg by mouth daily.) 90 capsule 3   rosuvastatin (CRESTOR) 10 MG tablet Take 10 mg by mouth daily.     rosuvastatin (CRESTOR) 40 MG tablet Take 1 tablet (40 mg total) by mouth daily. 90 tablet 3   sulfamethoxazole-trimethoprim (BACTRIM DS) 800-160 MG tablet Take 1 tablet by mouth 2 (two) times daily. Start the day prior to foley removal appointment 6 tablet 0   traMADol (ULTRAM) 50 MG tablet Take 1-2 tablets (50-100 mg total) by mouth every 6 (six) hours as needed for moderate pain or severe pain. 20 tablet 0   No current facility-administered medications on file prior to visit.        ROS:  All others reviewed and negative.  Objective        PE:  BP 126/62 (BP Location: Right Arm, Patient Position: Sitting, Cuff Size: Normal)   Pulse 86    Temp 98.1 F (36.7 C) (Oral)   Ht 5\' 10"  (1.778 m)   Wt 158 lb (71.7 kg)   SpO2 100%   BMI 22.67 kg/m                 Constitutional: Pt appears in NAD               HENT: Head: NCAT.                Right Ear: External ear normal.                 Left Ear: External ear normal.                Eyes: . Pupils are equal, round, and reactive to light. Conjunctivae and EOM are normal               Nose: without d/c or deformity  Neck: Neck supple. Gross normal ROM               Cardiovascular: Normal rate and regular rhythm.                 Pulmonary/Chest: Effort normal and breath sounds without rales or wheezing.                Abd:  Soft, NT, ND, + BS, no organomegaly               Neurological: Pt is alert. At baseline orientation, motor grossly intact               Skin: Skin is warm. No rashes, no other new lesions, LE edema - none               Psychiatric: Pt behavior is normal without agitation   Micro: none  Cardiac tracings I have personally interpreted today:  none  Pertinent Radiological findings (summarize): none   Lab Results  Component Value Date   WBC 6.6 03/07/2021   HGB 10.1 (L) 03/07/2021   HCT 30.4 (L) 03/07/2021   PLT 384.0 03/07/2021   GLUCOSE 95 03/07/2021   CHOL 97 03/07/2021   TRIG 63.0 03/07/2021   HDL 39.90 03/07/2021   LDLDIRECT 74.0 04/21/2019   LDLCALC 45 03/07/2021   ALT 8 03/07/2021   AST 12 03/07/2021   NA 136 03/07/2021   K 4.1 03/07/2021   CL 103 03/07/2021   CREATININE 1.18 03/07/2021   BUN 24 (H) 03/07/2021   CO2 26 03/07/2021   TSH 1.14 03/07/2021   PSA 0.00 Repeated and verified X2. (L) 03/07/2021   HGBA1C 4.8 03/07/2021   Assessment/Plan:  NEILAN RIZZO is a 70 y.o. Black or African American [2] male with  has a past medical history of Anemia, iron deficiency (04/02/2016), Aortic atherosclerosis ([place]), Arthritis, BPH (benign prostatic hyperplasia) (04/10/2017), Cancer ([place]), COLONIC POLYPS, HX OF (11/24/2007), COPD  (chronic obstructive pulmonary disease) ([place]), Coronary artery calcification seen on CAT scan, DIVERTICULOSIS, COLON (11/24/2007), ED (erectile dysfunction), GERD (gastroesophageal reflux disease), HLD (hyperlipidemia) (09/06/2020), HYPERTENSION (11/24/2007), Increased prostate specific antigen (PSA) velocity (06/22/2011), PAH (pulmonary artery hypertension) ([place]), and Peripheral vascular disease ([place]).  Vitamin D deficiency Last vitamin D Lab Results  Component Value Date   VD25OH 16.07 (L) 09/06/2020   Low, to start oral replacement   Encounter for well adult exam with abnormal findings Age and sex appropriate education and counseling updated with regular exercise and diet Referrals for preventative services - none needed Immunizations addressed - for pneumovax, but declines covid booster, shingrix Smoking counseling  - counseled to quit, pt not ready Evidence for depression or other mood disorder - none significant Most recent labs reviewed. I have personally reviewed and have noted: 1) the patient's medical and social history 2) The patient's current medications and supplements 3) The patient's height, weight, and BMI have been recorded in the chart   Anemia, iron deficiency Also for iron lab in f/u today  HLD (hyperlipidemia) Lab Results  Component Value Date   LDLCALC 45 03/07/2021   Stable, pt to continue current statin crestor   Hyperglycemia Lab Results  Component Value Date   HGBA1C 4.8 03/07/2021   Stable, pt to continue current medical treatment  - diet   Hypertension BP Readings from Last 3 Encounters:  03/07/21 126/62  01/09/21 123/72  12/25/20 (!) 112/58   Stable, pt to continue medical treatment lotrel, microzide   Smoker counsled to  quit, pt not ready  Followup: No follow-ups on file.  Cathlean Cower, MD 03/07/2021 9:30 PM [facility]

## 2021-03-07 NOTE — Assessment & Plan Note (Signed)
Lab Results  Component Value Date   LDLCALC 45 03/07/2021   Stable, pt to continue current statin crestor

## 2021-03-07 NOTE — Assessment & Plan Note (Addendum)
Age and sex appropriate education and counseling updated with regular exercise and diet Referrals for preventative services - none needed Immunizations addressed - for pneumovax, but declines covid booster, shingrix Smoking counseling  - counseled to quit, pt not ready Evidence for depression or other mood disorder - none significant Most recent labs reviewed. I have personally reviewed and have noted: 1) the patient's medical and social history 2) The patient's current medications and supplements 3) The patient's height, weight, and BMI have been recorded in the chart

## 2021-03-07 NOTE — Assessment & Plan Note (Signed)
Last vitamin D Lab Results  Component Value Date   VD25OH 16.07 (L) 09/06/2020   Low, to start oral replacement

## 2021-03-07 NOTE — Assessment & Plan Note (Signed)
counsled to quit, pt not ready 

## 2021-03-07 NOTE — Assessment & Plan Note (Signed)
Also for iron lab in f/u today

## 2021-03-07 NOTE — Assessment & Plan Note (Signed)
BP Readings from Last 3 Encounters:  03/07/21 126/62  01/09/21 123/72  12/25/20 (!) 112/58   Stable, pt to continue medical treatment lotrel, microzide

## 2021-03-08 ENCOUNTER — Other Ambulatory Visit: Payer: Self-pay | Admitting: Internal Medicine

## 2021-03-14 ENCOUNTER — Ambulatory Visit (INDEPENDENT_AMBULATORY_CARE_PROVIDER_SITE_OTHER): Payer: BC Managed Care – PPO | Admitting: Podiatry

## 2021-03-14 ENCOUNTER — Other Ambulatory Visit: Payer: Self-pay

## 2021-03-14 ENCOUNTER — Ambulatory Visit (INDEPENDENT_AMBULATORY_CARE_PROVIDER_SITE_OTHER): Payer: BC Managed Care – PPO

## 2021-03-14 DIAGNOSIS — L97512 Non-pressure chronic ulcer of other part of right foot with fat layer exposed: Secondary | ICD-10-CM | POA: Diagnosis not present

## 2021-03-14 DIAGNOSIS — L03031 Cellulitis of right toe: Secondary | ICD-10-CM | POA: Diagnosis not present

## 2021-03-14 DIAGNOSIS — I739 Peripheral vascular disease, unspecified: Secondary | ICD-10-CM | POA: Diagnosis not present

## 2021-03-14 DIAGNOSIS — I7025 Atherosclerosis of native arteries of other extremities with ulceration: Secondary | ICD-10-CM

## 2021-03-14 MED ORDER — DOXYCYCLINE HYCLATE 100 MG PO TABS: 100.0000 mg | ORAL_TABLET | Freq: Two times a day (BID) | ORAL | 1 refills | Status: DC

## 2021-03-15 NOTE — Progress Notes (Signed)
Subjective:   Patient ID: Joshua Curtis, male   DOB: 70 y.o.   MRN: [id number]   HPI Patient presents concerned about the possibility for an ulceration of his right great toe stating its not been drainage currently but the nailbed seems loose and there is been some irritation and discomfort within the nailbed itself.  States he has had stents done and has had some increase in his circulatory flow bilateral   ROS      Objective:  Physical Exam  Patient does have diminished pulses bilateral that are present with warm feet and damaged right hallux nail that is loose with low-grade localized drainage with no proximal edema or edema drainage noted     Assessment:  Trauma to the right hallux where there there is usually vascular disease but no proximal spread currently     Plan:  H&P reviewed condition discussed soaks and discussed the possibility for infection or nail removal in future.  I want to try to be as conservative as possible with this patient and I went ahead today and I debrided a little bit of tissue advised on soaks and precautionary placed on doxycycline.  I do think it will heal uneventfully but patient is to call us or let it or come in if any issues were to occur  X-rays indicate no signs of osteolysis or bony trauma from injury

## 2021-03-19 ENCOUNTER — Encounter: Payer: Self-pay | Admitting: Podiatry

## 2021-03-19 ENCOUNTER — Telehealth: Payer: Self-pay | Admitting: Podiatry

## 2021-03-19 NOTE — Telephone Encounter (Signed)
Joshua Curtis was seen by Dr. Paulla Dolly on 03/14/21, he said that he is still experiencing pain and would like something for pain. Mr. Konecny also said that his right foot is swollen. He is scheduled to come back in to see you on 04/02/2021. Mr. Word, employer called because he was suppose to return to work on 03/20/2021. I told Mr. Fettig that I spoke with you on last week and you would like to see him before returning to work, especially with the new foot pain that he is having. He wanted a note for work, because he's in too much pain to return tomorrow. I sent note to his employer.

## 2021-03-20 ENCOUNTER — Ambulatory Visit (INDEPENDENT_AMBULATORY_CARE_PROVIDER_SITE_OTHER): Payer: BC Managed Care – PPO | Admitting: Podiatry

## 2021-03-20 ENCOUNTER — Encounter: Payer: Self-pay | Admitting: Podiatry

## 2021-03-20 ENCOUNTER — Other Ambulatory Visit: Payer: Self-pay

## 2021-03-20 DIAGNOSIS — M869 Osteomyelitis, unspecified: Secondary | ICD-10-CM

## 2021-03-20 DIAGNOSIS — I739 Peripheral vascular disease, unspecified: Secondary | ICD-10-CM | POA: Diagnosis not present

## 2021-03-20 DIAGNOSIS — I7025 Atherosclerosis of native arteries of other extremities with ulceration: Secondary | ICD-10-CM

## 2021-03-20 DIAGNOSIS — L03031 Cellulitis of right toe: Secondary | ICD-10-CM

## 2021-03-20 MED ORDER — AMOXICILLIN-POT CLAVULANATE 875-125 MG PO TABS: 1.0000 | ORAL_TABLET | Freq: Two times a day (BID) | ORAL | 0 refills | Status: DC

## 2021-03-21 ENCOUNTER — Other Ambulatory Visit: Payer: Self-pay | Admitting: Podiatry

## 2021-03-21 ENCOUNTER — Telehealth: Payer: Self-pay | Admitting: Podiatry

## 2021-03-21 MED ORDER — HYDROCODONE-ACETAMINOPHEN 5-325 MG PO TABS: 1.0000 | ORAL_TABLET | Freq: Four times a day (QID) | ORAL | 0 refills | Status: DC | PRN

## 2021-03-21 NOTE — Telephone Encounter (Signed)
Joshua Curtis said he really needs something for pain.

## 2021-03-22 DIAGNOSIS — N393 Stress incontinence (female) (male): Secondary | ICD-10-CM | POA: Diagnosis not present

## 2021-03-22 DIAGNOSIS — M62838 Other muscle spasm: Secondary | ICD-10-CM | POA: Diagnosis not present

## 2021-03-22 DIAGNOSIS — M869 Osteomyelitis, unspecified: Secondary | ICD-10-CM | POA: Diagnosis not present

## 2021-03-22 DIAGNOSIS — M6281 Muscle weakness (generalized): Secondary | ICD-10-CM | POA: Diagnosis not present

## 2021-03-23 LAB — CBC WITH DIFFERENTIAL/PLATELET
Absolute Monocytes: 637 cells/uL (ref 200–950)
Basophils Absolute: 49 cells/uL (ref 0–200)
Basophils Relative: 0.7 %
Eosinophils Absolute: 581 cells/uL — ABNORMAL HIGH (ref 15–500)
Eosinophils Relative: 8.3 %
HCT: 28.1 % — ABNORMAL LOW (ref 38.5–50.0)
Hemoglobin: 9.2 g/dL — ABNORMAL LOW (ref 13.2–17.1)
Lymphs Abs: 1694 cells/uL (ref 850–3900)
MCH: 31.3 pg (ref 27.0–33.0)
MCHC: 32.7 g/dL (ref 32.0–36.0)
MCV: 95.6 fL (ref 80.0–100.0)
MPV: 9.5 fL (ref 7.5–12.5)
Monocytes Relative: 9.1 %
Neutro Abs: 4039 cells/uL (ref 1500–7800)
Neutrophils Relative %: 57.7 %
Platelets: 344 10*3/uL (ref 140–400)
RBC: 2.94 10*6/uL — ABNORMAL LOW (ref 4.20–5.80)
RDW: 13.2 % (ref 11.0–15.0)
Total Lymphocyte: 24.2 %
WBC: 7 10*3/uL (ref 3.8–10.8)

## 2021-03-23 LAB — C-REACTIVE PROTEIN: CRP: 4.2 mg/L (ref <8.0)

## 2021-03-24 LAB — WOUND CULTURE
MICRO NUMBER:: 12136448
SPECIMEN QUALITY:: ADEQUATE

## 2021-03-24 NOTE — Progress Notes (Signed)
Subjective: 70 year old male presents the office today for concerns of right big toe pain.  He recently saw Dr. Paulla Dolly on March 14, 2021 and was placed on doxycycline.  He states the toe continues to be painful he is noticed drainage on the right big toenail.  He is also noted some swelling to the foot.  Denies any fevers or chills.  No nausea or vomiting.  Objective: AAO x3, NAD Wound on the left side appears to be healed.  The right side hyperkeratotic tissue the distal aspect of the toe.  Upon palpation of the toenail it is loose and there is purulence identified underneath the toenail.  Due to this as well as the pain and abscess the nail was removed.  There is already a wound present under the nail.  Upon removal of the nail bone was exposed to the distal portion.  There is edema present the toe as well as mild to the foot.  There is no erythema or warmth of the foot. No pain with calf compression, swelling, warmth, erythema  Assessment: Right hallux ulceration, likely osteomyelitis  Plan: -All treatment options discussed with the patient including all alternatives, risks, complications.  -Reviewed the x-rays with him from last appointment.  Given the abscess as well as the wound the nail was removed.  Skin was prepped with alcohol and 3 cc of lidocaine, Marcaine plain was infiltrated in a digital block fashion.  The toe was cleaned with Betadine.  The nail was easily removed without any complications.  No tourniquet was used.  Once this was removed the bone was exposed.  Cleaned the area.  Small mount of Silvadene was applied followed by dressing.  Post procedure instructions discussed.  I discussed with him likely amputation of the toe is needed at this time.  For now we will continue antibiotics as he has to go out of town to 3 different funerals this weekend.  I think he is stable at this time to do this and I will see him back next week.  We will repeat the x-rays and if the bone is still  exposed or there is any changes on the x-ray will likely need to proceed with amputation.  However in the meantime with any worsening needs to report to the emergency department. -Continue antibiotics -Wound culture obtained and blood work ordered.  -Patient encouraged to call the office with any questions, concerns, change in symptoms.   *Repeat x-ray next appointment  Return in about 1 week (around 03/27/2021).  Trula Slade DPM

## 2021-03-27 ENCOUNTER — Other Ambulatory Visit: Payer: Self-pay | Admitting: Podiatry

## 2021-03-27 MED ORDER — CIPROFLOXACIN HCL 500 MG PO TABS
500.0000 mg | ORAL_TABLET | Freq: Two times a day (BID) | ORAL | 0 refills | Status: DC
Start: 2021-03-27 — End: 2021-03-30

## 2021-03-28 ENCOUNTER — Telehealth: Payer: Self-pay | Admitting: *Deleted

## 2021-03-28 NOTE — Telephone Encounter (Signed)
Tried to call the patient today and there was no answer the phone just kept ringing. Joshua Curtis

## 2021-03-30 ENCOUNTER — Encounter: Payer: Self-pay | Admitting: Podiatry

## 2021-03-30 ENCOUNTER — Ambulatory Visit (INDEPENDENT_AMBULATORY_CARE_PROVIDER_SITE_OTHER): Payer: BC Managed Care – PPO

## 2021-03-30 ENCOUNTER — Other Ambulatory Visit: Payer: Self-pay

## 2021-03-30 ENCOUNTER — Telehealth: Payer: Self-pay

## 2021-03-30 ENCOUNTER — Telehealth: Payer: Self-pay | Admitting: *Deleted

## 2021-03-30 ENCOUNTER — Ambulatory Visit (INDEPENDENT_AMBULATORY_CARE_PROVIDER_SITE_OTHER): Payer: BC Managed Care – PPO | Admitting: Podiatry

## 2021-03-30 ENCOUNTER — Ambulatory Visit: Payer: Medicare Other

## 2021-03-30 DIAGNOSIS — M869 Osteomyelitis, unspecified: Secondary | ICD-10-CM

## 2021-03-30 DIAGNOSIS — I739 Peripheral vascular disease, unspecified: Secondary | ICD-10-CM

## 2021-03-30 DIAGNOSIS — I7025 Atherosclerosis of native arteries of other extremities with ulceration: Secondary | ICD-10-CM

## 2021-03-30 MED ORDER — CIPROFLOXACIN HCL 500 MG PO TABS: 500.0000 mg | ORAL_TABLET | Freq: Two times a day (BID) | ORAL | 0 refills | Status: DC

## 2021-03-30 MED ORDER — HYDROCODONE-ACETAMINOPHEN 5-325 MG PO TABS: 1.0000 | ORAL_TABLET | Freq: Four times a day (QID) | ORAL | 0 refills | Status: DC | PRN

## 2021-03-30 NOTE — Telephone Encounter (Signed)
Dr. Jacqualyn Posey called to schedule pt for R LE studies due to possible need for R big toe amputation. He is also wanting pt to f/u w/ MD in office next week. Dr.Wagoner is planning for the possible surgery the following week. He is giving pt the appt date/time as pt is still in his office.

## 2021-03-30 NOTE — Telephone Encounter (Signed)
Patient was in the office today and saw Dr Jacqualyn Posey. Lattie Haw

## 2021-03-30 NOTE — Telephone Encounter (Signed)
-----   Message from Trula Slade, DPM sent at 03/27/2021  8:01 AM EDT ----- Lattie Haw- please let him know that I have added cipro given the wound culture. He can take the augmentin and cipro. Thanks.

## 2021-04-02 ENCOUNTER — Ambulatory Visit (INDEPENDENT_AMBULATORY_CARE_PROVIDER_SITE_OTHER)
Admission: RE | Admit: 2021-04-02 | Discharge: 2021-04-02 | Disposition: A | Discharge status: Home / Self Care | Payer: BC Managed Care – PPO | Source: Ambulatory Visit | Attending: Vascular Surgery | Admitting: Vascular Surgery

## 2021-04-02 ENCOUNTER — Ambulatory Visit (HOSPITAL_COMMUNITY)
Admission: RE | Admit: 2021-04-02 | Discharge: 2021-04-02 | Disposition: A | Discharge status: Home / Self Care | Payer: BC Managed Care – PPO | Source: Ambulatory Visit | Attending: Vascular Surgery | Admitting: Vascular Surgery

## 2021-04-02 ENCOUNTER — Ambulatory Visit: Payer: Medicare Other | Admitting: Podiatry

## 2021-04-02 ENCOUNTER — Other Ambulatory Visit: Payer: Self-pay

## 2021-04-02 DIAGNOSIS — I7025 Atherosclerosis of native arteries of other extremities with ulceration: Secondary | ICD-10-CM | POA: Diagnosis not present

## 2021-04-03 ENCOUNTER — Other Ambulatory Visit: Payer: Self-pay

## 2021-04-03 ENCOUNTER — Ambulatory Visit (INDEPENDENT_AMBULATORY_CARE_PROVIDER_SITE_OTHER): Payer: BC Managed Care – PPO | Admitting: Podiatry

## 2021-04-03 ENCOUNTER — Ambulatory Visit (INDEPENDENT_AMBULATORY_CARE_PROVIDER_SITE_OTHER): Payer: BC Managed Care – PPO | Admitting: Vascular Surgery

## 2021-04-03 ENCOUNTER — Encounter: Payer: Self-pay | Admitting: Podiatry

## 2021-04-03 ENCOUNTER — Encounter: Payer: Self-pay | Admitting: Vascular Surgery

## 2021-04-03 VITALS — BP 114/69 | HR 71 | Temp 98.4°F | Resp 20 | Ht 70.0 in | Wt 157.0 lb

## 2021-04-03 DIAGNOSIS — M869 Osteomyelitis, unspecified: Secondary | ICD-10-CM | POA: Diagnosis not present

## 2021-04-03 DIAGNOSIS — I739 Peripheral vascular disease, unspecified: Secondary | ICD-10-CM

## 2021-04-03 DIAGNOSIS — I70235 Atherosclerosis of native arteries of right leg with ulceration of other part of foot: Secondary | ICD-10-CM | POA: Diagnosis not present

## 2021-04-03 DIAGNOSIS — I7025 Atherosclerosis of native arteries of other extremities with ulceration: Secondary | ICD-10-CM

## 2021-04-03 NOTE — H&P (View-Only) (Signed)
[facility] [place]  ASSESSMENT / PLAN: Joshua Curtis is a 70 y.o. male with atherosclerosis of native arteries of right lower extremity causing ulceration and right great toe osteomyelitis. He underwent right femoropopliteal stenting, tibioperoneal angioplasty, and peroneal angioplasty 07/18/20; left femoropopliteal stenting 07/04/20 both with Dr. Trula Slade.    Patient counseled patients with chronic limb threatening ischemia have an annual risk of cardiovascular mortality of 25% and a annual amputation risk of 25%. Aggressive risk factor modification and intervention for limb salvage is indicated..  Recommend the following which can slow the progression of atherosclerosis and reduce the risk of major adverse cardiac / limb events:  Complete cessation from all tobacco products. Blood glucose control with goal A1c < 7%. Blood pressure control with goal blood pressure < 140/90 mmHg. Lipid reduction therapy with goal LDL-C <100 mg/dL (<70 if symptomatic from PAD).  Aspirin '81mg'$  PO QD.  Clopidogrel '75mg'$  PO QD. Atorvastatin 40-'80mg'$  PO QD (or other "high intensity" statin therapy).  Plan right lower extremity angiogram with possible intervention via left common femoral artery approach in cath lab 04/04/21.   CHIEF COMPLAINT: right great toe wound  HISTORY OF PRESENT ILLNESS: Joshua Curtis is a 70 y.o. male well-known to our practice.  The patient underwent endovascular management of bilateral toe ulcers by Dr. Trula Slade in late fall 2021.  On 07/04/2020 he performed left femoral-popliteal stenting.  On 07/18/2020 he performed right femoral-popliteal stenting, tibioperoneal angioplasty, and peroneal angioplasty.  He had a good result initially and healed.  Unfortunately he has developed recurrence of his right great toe ulcer.  He has been under the care of Dr. Jacqualyn Posey of podiatry who is planning to a right great toe amputation.  VASCULAR SURGICAL HISTORY:  right  femoropopliteal stenting, tibioperoneal angioplasty, and peroneal angioplasty 07/18/20 left femoropopliteal stenting 07/04/20  VASCULAR RISK FACTORS: Negative history of cerebrovascular disease / stroke / transient ischemic attack. Positive history of coronary artery disease.  Negative history of diabetes mellitus. Last A1c 4.8. Positive history of smoking. + actively smoking. Positive history of hypertension. Negative history of chronic kidney disease. Last GFR >60.  Positive history of chronic obstructive pulmonary disease.  AMBULATORY STATUS: Ambulatory within the community without limits  Past Medical History:  Diagnosis Date   Anemia, iron deficiency 04/02/2016   Aortic atherosclerosis (HCC)    Arthritis    rt knee   BPH (benign prostatic hyperplasia) 04/10/2017   Cancer ([place])    prostate   COLONIC POLYPS, HX OF 11/24/2007   COPD (chronic obstructive pulmonary disease) ([place])    pt denies this- no respiratory issues of any kind per pt   Coronary artery calcification seen on CAT scan    DIVERTICULOSIS, COLON 11/24/2007   ED (erectile dysfunction)    GERD (gastroesophageal reflux disease)    HLD (hyperlipidemia) 09/06/2020   HYPERTENSION 11/24/2007   Increased prostate specific antigen (PSA) velocity 06/22/2011   PAH (pulmonary artery hypertension) ([place])    Peripheral vascular disease ([place])     Past Surgical History:  Procedure Laterality Date   ABDOMINAL AORTOGRAM W/LOWER EXTREMITY N/A 07/04/2020   Procedure: ABDOMINAL AORTOGRAM W/LOWER EXTREMITY;  Surgeon: Serafina Mitchell, MD;  Location: [facility];  Service: Cardiovascular;  Laterality: N/A;   ABDOMINAL AORTOGRAM W/LOWER EXTREMITY N/A 07/18/2020   Procedure: ABDOMINAL AORTOGRAM W/LOWER EXTREMITY;  Surgeon: Serafina Mitchell, MD;  Location: [facility];  Service: Cardiovascular;  Laterality: N/A;   COLONOSCOPY     NO PAST SURGERIES  PERIPHERAL VASCULAR BALLOON ANGIOPLASTY Right 07/18/2020   Procedure:  PERIPHERAL VASCULAR BALLOON ANGIOPLASTY;  Surgeon: Serafina Mitchell, MD;  Location: [facility];  Service: Cardiovascular;  Laterality: Right;  tibioperoneal trunk and posterior tibial   PERIPHERAL VASCULAR INTERVENTION  07/04/2020   Procedure: PERIPHERAL VASCULAR INTERVENTION;  Surgeon: Serafina Mitchell, MD;  Location: [facility];  Service: Cardiovascular;;  Lt. SFA and Popliteal   PERIPHERAL VASCULAR INTERVENTION Right 07/18/2020   Procedure: PERIPHERAL VASCULAR INTERVENTION;  Surgeon: Serafina Mitchell, MD;  Location: [facility];  Service: Cardiovascular;  Laterality: Right;  Femoral Popliteal   ROBOT ASSISTED LAPAROSCOPIC RADICAL PROSTATECTOMY N/A 01/08/2021   Procedure: XI ROBOTIC ASSISTED LAPAROSCOPIC RADICAL PROSTATECTOMY LEVEL 3;  Surgeon: Raynelle Bring, MD;  Location: [facility];  Service: Urology;  Laterality: N/A;    Family History  Problem Relation Age of Onset   Stroke Mother    Diabetes Father    Hypertension Sister    Kidney failure Brother        dialysis   Diabetes Paternal Grandfather    Anuerysm Brother    Colon cancer Neg Hx    Esophageal cancer Neg Hx    Rectal cancer Neg Hx    Stomach cancer Neg Hx     Social History   Socioeconomic History   Marital status: Married    Spouse name: Not on file   Number of children: 2   Years of education: Not on file   Highest education level: Not on file  Occupational History   Not on file  Tobacco Use   Smoking status: Every Day    Packs/day: 0.25    Types: Cigarettes   Smokeless tobacco: Never   Tobacco comments:    tryimg to quit  Vaping Use   Vaping Use: Never used  Substance and Sexual Activity   Alcohol use: Not Currently    Comment: beer rare   Drug use: No   Sexual activity: Not Currently  Other Topics Concern   Not on file  Social History Narrative   Not on file   Social Determinants of Health   Financial Resource Strain: Not on file  Food Insecurity: Not on file  Transportation  Needs: Not on file  Physical Activity: Not on file  Stress: Not on file  Social Connections: Not on file  Intimate Partner Violence: Not on file    No Known Allergies  Current Outpatient Medications  Medication Sig Dispense Refill   amLODipine-benazepril (LOTREL) 10-40 MG capsule TAKE 1 CAPSULE BY MOUTH  DAILY 90 capsule 3   amoxicillin-clavulanate (AUGMENTIN) 875-125 MG tablet Take 1 tablet by mouth 2 (two) times daily. 20 tablet 0   aspirin EC 81 MG tablet Take 81 mg by mouth daily. Swallow whole.     clopidogrel (PLAVIX) 75 MG tablet Take 75 mg by mouth daily.     ferrous sulfate 325 (65 FE) MG tablet Take 1 tablet (325 mg total) by mouth daily with breakfast. 90 tablet 0   hydrochlorothiazide (MICROZIDE) 12.5 MG capsule TAKE 1 CAPSULE BY MOUTH  DAILY (Patient taking differently: Take 12.5 mg by mouth daily.) 90 capsule 3   meloxicam (MOBIC) 15 MG tablet Take 15 mg by mouth daily as needed.     rosuvastatin (CRESTOR) 40 MG tablet Take 1 tablet (40 mg total) by mouth daily. 90 tablet 3   tamsulosin (FLOMAX) 0.4 MG CAPS capsule Take 0.4 mg by mouth at bedtime.     No current facility-administered  medications for this visit.    REVIEW OF SYSTEMS:  '[X]'$  denotes positive finding, '[ ]'$  denotes negative finding Cardiac  Comments:  Chest pain or chest pressure:    Shortness of breath upon exertion:    Short of breath when lying flat:    Irregular heart rhythm:        Vascular    Pain in calf, thigh, or hip brought on by ambulation:    Pain in feet at night that wakes you up from your sleep:     Blood clot in your veins:    Leg swelling:         Pulmonary    Oxygen at home:    Productive cough:     Wheezing:         Neurologic    Sudden weakness in arms or legs:     Sudden numbness in arms or legs:     Sudden onset of difficulty speaking or slurred speech:    Temporary loss of vision in one eye:     Problems with dizziness:         Gastrointestinal    Blood in stool:      Vomited blood:         Genitourinary    Burning when urinating:     Blood in urine:        Psychiatric    Major depression:         Hematologic    Bleeding problems:    Problems with blood clotting too easily:        Skin    Rashes or ulcers:        Constitutional    Fever or chills:      PHYSICAL EXAM Vitals:   04/03/21 1300  BP: 114/69  Pulse: 71  Resp: 20  Temp: 98.4 F (36.9 C)  SpO2: 100%  Weight: 157 lb (71.2 kg)  Height: '5\' 10"'$  (1.778 m)    Constitutional: well appearing. no distress. Appears well nourished.  Neurologic: CN intact. no focal findings. no sensory loss. Psychiatric:  Mood and affect symmetric and appropriate. Eyes:  No icterus. No conjunctival pallor. Ears, nose, throat:  mucous membranes moist. Midline trachea.  Cardiac: regular rate and rhythm.  Respiratory:  unlabored. Abdominal:  soft, non-tender, non-distended.  Peripheral vascular: 2+ femoral pulses  No popliteal pulses  No pedal pulses Extremity: no edema. no cyanosis. no pallor.  Skin: no gangrene. + ulceration about R great toe about the nail bed. Healing L great toe ulcer.  Lymphatic: No Stemmer's sign. no palpable lymphadenopathy.  PERTINENT LABORATORY AND RADIOLOGIC DATA  Most recent CBC CBC Latest Ref Rng & Units 03/22/2021 03/07/2021 01/09/2021  WBC 3.8 - 10.8 Thousand/uL 7.0 6.6 -  Hemoglobin 13.2 - 17.1 g/dL 9.2(L) 10.1(L) 11.1(L)  Hematocrit 38.5 - 50.0 % 28.1(L) 30.4(L) 33.7(L)  Platelets 140 - 400 Thousand/uL 344 384.0 -     Most recent CMP CMP Latest Ref Rng & Units 03/07/2021 01/01/2021 09/06/2020  Glucose 70 - 99 mg/dL 95 99 94  BUN 6 - 23 mg/dL 24(H) 16 13  Creatinine 0.40 - 1.50 mg/dL 1.18 1.07 1.05  Sodium 135 - 145 mEq/L 136 138 139  Potassium 3.5 - 5.1 mEq/L 4.1 3.9 3.7  Chloride 96 - 112 mEq/L 103 105 102  CO2 19 - 32 mEq/L '26 27 31  '$ Calcium 8.4 - 10.5 mg/dL 8.9 8.8(L) 8.9  Total Protein 6.0 - 8.3 g/dL 6.5 - 6.8  Total Bilirubin 0.2 -  1.2 mg/dL 0.2 - 0.4   Alkaline Phos 39 - 117 U/L 49 - 61  AST 0 - 37 U/L 12 - 25  ALT 0 - 53 U/L 8 - 27    Renal function CrCl cannot be calculated (Patient's most recent lab result is older than the maximum 21 days allowed.).  Hgb A1c MFr Bld (%)  Date Value  03/07/2021 4.8    LDL Cholesterol  Date Value Ref Range Status  03/07/2021 45 0 - 99 mg/dL Final   Direct LDL  Date Value Ref Range Status  04/21/2019 74.0 mg/dL Final    Comment:    Optimal:  <100 mg/dLNear or Above Optimal:  100-129 mg/dLBorderline High:  130-159 mg/dLHigh:  160-189 mg/dLVery High:  >190 mg/dL    LOWER EXTREMITY DOPPLER STUDY   Patient Name:  Joshua Curtis  Date of Exam:   04/02/2021  Medical Rec #: [id number]         Accession #:    [id number]  Date of Birth: [birth year]         Patient Gender: M  Patient Age:   70Y  Exam Location:  [facility]  Procedure:      VAS [place] ABI WITH/WO TBI  Referring Phys: [id number] MATTHEW EVELAND    ---------------------------------------------------------------------------  -----     Indications: Ulceration, and peripheral artery disease.   High Risk Factors: Hyperlipidemia.     Vascular Interventions: Left superficial femoral and popliteal artery  stenting                          07/04/2020; failed angioplasty of posterior tibial                          artery.                          07/08/2020: Right popliteal and superficial  femoral                          artery stenting with tibioperoneal and peroneal                          angioplasty.   Performing Technologist: Ralene Cork RVT      Examination Guidelines: A complete evaluation includes at minimum, Doppler  waveform signals and systolic blood pressure reading at the level of  bilateral  brachial, anterior tibial, and posterior tibial arteries, when vessel  segments  are accessible. Bilateral testing is considered an integral part of a  complete  examination. Photoelectric Plethysmograph  (PPG) waveforms and toe systolic  pressure readings are included as required and additional duplex testing  as  needed. Limited examinations for reoccurring indications may be performed  as  noted.      ABI Findings:  +---------+------------------+-----+-------------------+--------+  Right    Rt Pressure (mmHg)IndexWaveform           Comment   +---------+------------------+-----+-------------------+--------+  Brachial 121                                                 +---------+------------------+-----+-------------------+--------+  PTA      61  0.48 dampened monophasic          +---------+------------------+-----+-------------------+--------+  DP       50                0.40 dampened monophasic          +---------+------------------+-----+-------------------+--------+  Great Toe0                 0.00                              +---------+------------------+-----+-------------------+--------+   +---------+------------------+-----+----------+-------+  Left     Lt Pressure (mmHg)IndexWaveform  Comment  +---------+------------------+-----+----------+-------+  Brachial 126                                       +---------+------------------+-----+----------+-------+  PTA      120               0.95 monophasic         +---------+------------------+-----+----------+-------+  DP       106               0.84 monophasic         +---------+------------------+-----+----------+-------+  Great Toe39                0.31                    +---------+------------------+-----+----------+-------+   +-------+-----------+-----------+------------+------------+  ABI/TBIToday's ABIToday's TBIPrevious ABIPrevious TBI  +-------+-----------+-----------+------------+------------+  Right  0.48       0          0.78        0             +-------+-----------+-----------+------------+------------+  Left   0.95        0.31       0.9         0             +-------+-----------+-----------+------------+------------+         Previous ABI on 12/21/20.     Summary:  Right: Resting right ankle-brachial index indicates severe right lower  extremity arterial disease. The right toe-brachial index is abnormal. RT  great toe pressure = 0 mmHg.   Left: Resting left ankle-brachial index is within normal range. The left  toe-brachial index is abnormal. LT Great toe pressure = 39 mmHg. Pedal  pressures may be falsely elevated resulting in an unreliable ABI.     *See table(s) above for measurements and observations.      Yevonne Aline. Stanford Breed, MD [facility] Phone Number: [phone] 04/03/2021 5:21 PM

## 2021-04-03 NOTE — Progress Notes (Signed)
[facility] [place]  ASSESSMENT / PLAN: Joshua Curtis is a 70 y.o. male with atherosclerosis of native arteries of right lower extremity causing ulceration and right great toe osteomyelitis. He underwent right femoropopliteal stenting, tibioperoneal angioplasty, and peroneal angioplasty 07/18/20; left femoropopliteal stenting 07/04/20 both with Dr. Trula Slade.    Patient counseled patients with chronic limb threatening ischemia have an annual risk of cardiovascular mortality of 25% and a annual amputation risk of 25%. Aggressive risk factor modification and intervention for limb salvage is indicated..  Recommend the following which can slow the progression of atherosclerosis and reduce the risk of major adverse cardiac / limb events:  Complete cessation from all tobacco products. Blood glucose control with goal A1c < 7%. Blood pressure control with goal blood pressure < 140/90 mmHg. Lipid reduction therapy with goal LDL-C <100 mg/dL (<70 if symptomatic from PAD).  Aspirin '81mg'$  PO QD.  Clopidogrel '75mg'$  PO QD. Atorvastatin 40-'80mg'$  PO QD (or other "high intensity" statin therapy).  Plan right lower extremity angiogram with possible intervention via left common femoral artery approach in cath lab 04/04/21.   CHIEF COMPLAINT: right great toe wound  HISTORY OF PRESENT ILLNESS: Joshua Curtis is a 70 y.o. male well-known to our practice.  The patient underwent endovascular management of bilateral toe ulcers by Dr. Trula Slade in late fall 2021.  On 07/04/2020 he performed left femoral-popliteal stenting.  On 07/18/2020 he performed right femoral-popliteal stenting, tibioperoneal angioplasty, and peroneal angioplasty.  He had a good result initially and healed.  Unfortunately he has developed recurrence of his right great toe ulcer.  He has been under the care of Dr. Jacqualyn Posey of podiatry who is planning to a right great toe amputation.  VASCULAR SURGICAL HISTORY:  right  femoropopliteal stenting, tibioperoneal angioplasty, and peroneal angioplasty 07/18/20 left femoropopliteal stenting 07/04/20  VASCULAR RISK FACTORS: Negative history of cerebrovascular disease / stroke / transient ischemic attack. Positive history of coronary artery disease.  Negative history of diabetes mellitus. Last A1c 4.8. Positive history of smoking. + actively smoking. Positive history of hypertension. Negative history of chronic kidney disease. Last GFR >60.  Positive history of chronic obstructive pulmonary disease.  AMBULATORY STATUS: Ambulatory within the community without limits  Past Medical History:  Diagnosis Date   Anemia, iron deficiency 04/02/2016   Aortic atherosclerosis (HCC)    Arthritis    rt knee   BPH (benign prostatic hyperplasia) 04/10/2017   Cancer ([place])    prostate   COLONIC POLYPS, HX OF 11/24/2007   COPD (chronic obstructive pulmonary disease) ([place])    pt denies this- no respiratory issues of any kind per pt   Coronary artery calcification seen on CAT scan    DIVERTICULOSIS, COLON 11/24/2007   ED (erectile dysfunction)    GERD (gastroesophageal reflux disease)    HLD (hyperlipidemia) 09/06/2020   HYPERTENSION 11/24/2007   Increased prostate specific antigen (PSA) velocity 06/22/2011   PAH (pulmonary artery hypertension) ([place])    Peripheral vascular disease ([place])     Past Surgical History:  Procedure Laterality Date   ABDOMINAL AORTOGRAM W/LOWER EXTREMITY N/A 07/04/2020   Procedure: ABDOMINAL AORTOGRAM W/LOWER EXTREMITY;  Surgeon: Serafina Mitchell, MD;  Location: [facility];  Service: Cardiovascular;  Laterality: N/A;   ABDOMINAL AORTOGRAM W/LOWER EXTREMITY N/A 07/18/2020   Procedure: ABDOMINAL AORTOGRAM W/LOWER EXTREMITY;  Surgeon: Serafina Mitchell, MD;  Location: [facility];  Service: Cardiovascular;  Laterality: N/A;   COLONOSCOPY     NO PAST SURGERIES  PERIPHERAL VASCULAR BALLOON ANGIOPLASTY Right 07/18/2020   Procedure:  PERIPHERAL VASCULAR BALLOON ANGIOPLASTY;  Surgeon: Serafina Mitchell, MD;  Location: [facility];  Service: Cardiovascular;  Laterality: Right;  tibioperoneal trunk and posterior tibial   PERIPHERAL VASCULAR INTERVENTION  07/04/2020   Procedure: PERIPHERAL VASCULAR INTERVENTION;  Surgeon: Serafina Mitchell, MD;  Location: [facility];  Service: Cardiovascular;;  Lt. SFA and Popliteal   PERIPHERAL VASCULAR INTERVENTION Right 07/18/2020   Procedure: PERIPHERAL VASCULAR INTERVENTION;  Surgeon: Serafina Mitchell, MD;  Location: [facility];  Service: Cardiovascular;  Laterality: Right;  Femoral Popliteal   ROBOT ASSISTED LAPAROSCOPIC RADICAL PROSTATECTOMY N/A 01/08/2021   Procedure: XI ROBOTIC ASSISTED LAPAROSCOPIC RADICAL PROSTATECTOMY LEVEL 3;  Surgeon: Raynelle Bring, MD;  Location: [facility];  Service: Urology;  Laterality: N/A;    Family History  Problem Relation Age of Onset   Stroke Mother    Diabetes Father    Hypertension Sister    Kidney failure Brother        dialysis   Diabetes Paternal Grandfather    Anuerysm Brother    Colon cancer Neg Hx    Esophageal cancer Neg Hx    Rectal cancer Neg Hx    Stomach cancer Neg Hx     Social History   Socioeconomic History   Marital status: Married    Spouse name: Not on file   Number of children: 2   Years of education: Not on file   Highest education level: Not on file  Occupational History   Not on file  Tobacco Use   Smoking status: Every Day    Packs/day: 0.25    Types: Cigarettes   Smokeless tobacco: Never   Tobacco comments:    tryimg to quit  Vaping Use   Vaping Use: Never used  Substance and Sexual Activity   Alcohol use: Not Currently    Comment: beer rare   Drug use: No   Sexual activity: Not Currently  Other Topics Concern   Not on file  Social History Narrative   Not on file   Social Determinants of Health   Financial Resource Strain: Not on file  Food Insecurity: Not on file  Transportation  Needs: Not on file  Physical Activity: Not on file  Stress: Not on file  Social Connections: Not on file  Intimate Partner Violence: Not on file    No Known Allergies  Current Outpatient Medications  Medication Sig Dispense Refill   amLODipine-benazepril (LOTREL) 10-40 MG capsule TAKE 1 CAPSULE BY MOUTH  DAILY 90 capsule 3   amoxicillin-clavulanate (AUGMENTIN) 875-125 MG tablet Take 1 tablet by mouth 2 (two) times daily. 20 tablet 0   aspirin EC 81 MG tablet Take 81 mg by mouth daily. Swallow whole.     clopidogrel (PLAVIX) 75 MG tablet Take 75 mg by mouth daily.     ferrous sulfate 325 (65 FE) MG tablet Take 1 tablet (325 mg total) by mouth daily with breakfast. 90 tablet 0   hydrochlorothiazide (MICROZIDE) 12.5 MG capsule TAKE 1 CAPSULE BY MOUTH  DAILY (Patient taking differently: Take 12.5 mg by mouth daily.) 90 capsule 3   meloxicam (MOBIC) 15 MG tablet Take 15 mg by mouth daily as needed.     rosuvastatin (CRESTOR) 40 MG tablet Take 1 tablet (40 mg total) by mouth daily. 90 tablet 3   tamsulosin (FLOMAX) 0.4 MG CAPS capsule Take 0.4 mg by mouth at bedtime.     No current facility-administered  medications for this visit.    REVIEW OF SYSTEMS:  '[X]'$  denotes positive finding, '[ ]'$  denotes negative finding Cardiac  Comments:  Chest pain or chest pressure:    Shortness of breath upon exertion:    Short of breath when lying flat:    Irregular heart rhythm:        Vascular    Pain in calf, thigh, or hip brought on by ambulation:    Pain in feet at night that wakes you up from your sleep:     Blood clot in your veins:    Leg swelling:         Pulmonary    Oxygen at home:    Productive cough:     Wheezing:         Neurologic    Sudden weakness in arms or legs:     Sudden numbness in arms or legs:     Sudden onset of difficulty speaking or slurred speech:    Temporary loss of vision in one eye:     Problems with dizziness:         Gastrointestinal    Blood in stool:      Vomited blood:         Genitourinary    Burning when urinating:     Blood in urine:        Psychiatric    Major depression:         Hematologic    Bleeding problems:    Problems with blood clotting too easily:        Skin    Rashes or ulcers:        Constitutional    Fever or chills:      PHYSICAL EXAM Vitals:   04/03/21 1300  BP: 114/69  Pulse: 71  Resp: 20  Temp: 98.4 F (36.9 C)  SpO2: 100%  Weight: 157 lb (71.2 kg)  Height: '5\' 10"'$  (1.778 m)    Constitutional: well appearing. no distress. Appears well nourished.  Neurologic: CN intact. no focal findings. no sensory loss. Psychiatric:  Mood and affect symmetric and appropriate. Eyes:  No icterus. No conjunctival pallor. Ears, nose, throat:  mucous membranes moist. Midline trachea.  Cardiac: regular rate and rhythm.  Respiratory:  unlabored. Abdominal:  soft, non-tender, non-distended.  Peripheral vascular: 2+ femoral pulses  No popliteal pulses  No pedal pulses Extremity: no edema. no cyanosis. no pallor.  Skin: no gangrene. + ulceration about R great toe about the nail bed. Healing L great toe ulcer.  Lymphatic: No Stemmer's sign. no palpable lymphadenopathy.  PERTINENT LABORATORY AND RADIOLOGIC DATA  Most recent CBC CBC Latest Ref Rng & Units 03/22/2021 03/07/2021 01/09/2021  WBC 3.8 - 10.8 Thousand/uL 7.0 6.6 -  Hemoglobin 13.2 - 17.1 g/dL 9.2(L) 10.1(L) 11.1(L)  Hematocrit 38.5 - 50.0 % 28.1(L) 30.4(L) 33.7(L)  Platelets 140 - 400 Thousand/uL 344 384.0 -     Most recent CMP CMP Latest Ref Rng & Units 03/07/2021 01/01/2021 09/06/2020  Glucose 70 - 99 mg/dL 95 99 94  BUN 6 - 23 mg/dL 24(H) 16 13  Creatinine 0.40 - 1.50 mg/dL 1.18 1.07 1.05  Sodium 135 - 145 mEq/L 136 138 139  Potassium 3.5 - 5.1 mEq/L 4.1 3.9 3.7  Chloride 96 - 112 mEq/L 103 105 102  CO2 19 - 32 mEq/L '26 27 31  '$ Calcium 8.4 - 10.5 mg/dL 8.9 8.8(L) 8.9  Total Protein 6.0 - 8.3 g/dL 6.5 - 6.8  Total Bilirubin 0.2 -  1.2 mg/dL 0.2 - 0.4   Alkaline Phos 39 - 117 U/L 49 - 61  AST 0 - 37 U/L 12 - 25  ALT 0 - 53 U/L 8 - 27    Renal function CrCl cannot be calculated (Patient's most recent lab result is older than the maximum 21 days allowed.).  Hgb A1c MFr Bld (%)  Date Value  03/07/2021 4.8    LDL Cholesterol  Date Value Ref Range Status  03/07/2021 45 0 - 99 mg/dL Final   Direct LDL  Date Value Ref Range Status  04/21/2019 74.0 mg/dL Final    Comment:    Optimal:  <100 mg/dLNear or Above Optimal:  100-129 mg/dLBorderline High:  130-159 mg/dLHigh:  160-189 mg/dLVery High:  >190 mg/dL    LOWER EXTREMITY DOPPLER STUDY   Patient Name:  Joshua Curtis  Date of Exam:   04/02/2021  Medical Rec #: [id number]         Accession #:    [id number]  Date of Birth: [birth year]         Patient Gender: M  Patient Age:   70Y  Exam Location:  [facility]  Procedure:      VAS [place] ABI WITH/WO TBI  Referring Phys: [id number] MATTHEW EVELAND    ---------------------------------------------------------------------------  -----     Indications: Ulceration, and peripheral artery disease.   High Risk Factors: Hyperlipidemia.     Vascular Interventions: Left superficial femoral and popliteal artery  stenting                          07/04/2020; failed angioplasty of posterior tibial                          artery.                          07/08/2020: Right popliteal and superficial  femoral                          artery stenting with tibioperoneal and peroneal                          angioplasty.   Performing Technologist: Ralene Cork RVT      Examination Guidelines: A complete evaluation includes at minimum, Doppler  waveform signals and systolic blood pressure reading at the level of  bilateral  brachial, anterior tibial, and posterior tibial arteries, when vessel  segments  are accessible. Bilateral testing is considered an integral part of a  complete  examination. Photoelectric Plethysmograph  (PPG) waveforms and toe systolic  pressure readings are included as required and additional duplex testing  as  needed. Limited examinations for reoccurring indications may be performed  as  noted.      ABI Findings:  +---------+------------------+-----+-------------------+--------+  Right    Rt Pressure (mmHg)IndexWaveform           Comment   +---------+------------------+-----+-------------------+--------+  Brachial 121                                                 +---------+------------------+-----+-------------------+--------+  PTA      61  0.48 dampened monophasic          +---------+------------------+-----+-------------------+--------+  DP       50                0.40 dampened monophasic          +---------+------------------+-----+-------------------+--------+  Great Toe0                 0.00                              +---------+------------------+-----+-------------------+--------+   +---------+------------------+-----+----------+-------+  Left     Lt Pressure (mmHg)IndexWaveform  Comment  +---------+------------------+-----+----------+-------+  Brachial 126                                       +---------+------------------+-----+----------+-------+  PTA      120               0.95 monophasic         +---------+------------------+-----+----------+-------+  DP       106               0.84 monophasic         +---------+------------------+-----+----------+-------+  Great Toe39                0.31                    +---------+------------------+-----+----------+-------+   +-------+-----------+-----------+------------+------------+  ABI/TBIToday's ABIToday's TBIPrevious ABIPrevious TBI  +-------+-----------+-----------+------------+------------+  Right  0.48       0          0.78        0             +-------+-----------+-----------+------------+------------+  Left   0.95        0.31       0.9         0             +-------+-----------+-----------+------------+------------+         Previous ABI on 12/21/20.     Summary:  Right: Resting right ankle-brachial index indicates severe right lower  extremity arterial disease. The right toe-brachial index is abnormal. RT  great toe pressure = 0 mmHg.   Left: Resting left ankle-brachial index is within normal range. The left  toe-brachial index is abnormal. LT Great toe pressure = 39 mmHg. Pedal  pressures may be falsely elevated resulting in an unreliable ABI.     *See table(s) above for measurements and observations.      Yevonne Aline. Stanford Breed, MD [facility] Phone Number: [phone] 04/03/2021 5:21 PM

## 2021-04-03 NOTE — Progress Notes (Signed)
Subjective: 70 year old male presents the office today for follow-up evaluation of a wound on the right big toe.  He carries an ulcer to the toenail was sent to the wound care center and the wound did eventually heal he reports.  He then followed up with Dr. Paulla Dolly on March 14, 2021 and was placed on doxycycline.  He continued to have pain and swelling and followed up with me last week.  There was infection of the toenail and nail was loose which was removed due to infection and underlying bone was exposed.  He was placed on further antibiotics and at that time he had to go out of town for 3 funerals.  He presents today for follow-up evaluation.  States he still getting pain to the big toe but he also feels a cold sensation to the other digits which is newer.  Denies any fevers or chills.  He has not seen any drainage or pus.  Objective: AAO x3, NAD On the right hallux there is a still a wound present at the distal portion of the toe and there is an area of exposed distal phalanx.  There is no drainage or pus.  There is minimal edema.  There is no significant erythema or warmth.  No ascending cellulitis.  No fluctuance or crepitation.  The wound appears to be stable. No pain with calf compression, swelling, warmth, erythema  Assessment: Right hallux ulceration, osteomyelitis  Plan: -All treatment options discussed with the patient including all alternatives, risks, complications.  -X-rays obtained reviewed.  There is some questionable cortical irregularity of the distal portion distal phalanx.  However clinically there is exposed bone and likely osteomyelitis.  I discussed with him hallux amputation today.  I discussed with surgery as well as postoperative course.  We will plan on doing this next Wednesday however concerned about his circulation worsening.  I called vein and vascular and he is going to have updated arterial studies on Monday.  They are going to try to get him into see your physician next  week as well.  At this point the toe is stable but if there is any worsening before I see him back next week he is to report emergency department.  He is also going to discuss the procedure with his wife and I recommended him to bring his wife to the appointment next week as well.  Continue antibiotics.  Also prescribed pain medication, Vicodin.  I spent 45 minutes with the patient and greater than 50% of time was in face-to-face contact and the rest the time was coordinating care and calling vein and vascular and trying to set up appointments.  Trula Slade DPM

## 2021-04-04 ENCOUNTER — Ambulatory Visit (HOSPITAL_BASED_OUTPATIENT_CLINIC_OR_DEPARTMENT_OTHER): Payer: BC Managed Care – PPO

## 2021-04-04 ENCOUNTER — Encounter (HOSPITAL_COMMUNITY): Payer: Self-pay | Admitting: Vascular Surgery

## 2021-04-04 ENCOUNTER — Encounter (HOSPITAL_COMMUNITY)
Admission: RE | Disposition: A | Discharge status: Home / Self Care | Payer: Self-pay | Source: Home / Self Care | Attending: Vascular Surgery

## 2021-04-04 ENCOUNTER — Ambulatory Visit (HOSPITAL_COMMUNITY)
Admission: RE | Admit: 2021-04-04 | Discharge: 2021-04-04 | Disposition: A | Discharge status: Home / Self Care | Payer: BC Managed Care – PPO | Attending: Vascular Surgery | Admitting: Vascular Surgery

## 2021-04-04 DIAGNOSIS — J449 Chronic obstructive pulmonary disease, unspecified: Secondary | ICD-10-CM | POA: Insufficient documentation

## 2021-04-04 DIAGNOSIS — F17210 Nicotine dependence, cigarettes, uncomplicated: Secondary | ICD-10-CM | POA: Diagnosis not present

## 2021-04-04 DIAGNOSIS — Z7902 Long term (current) use of antithrombotics/antiplatelets: Secondary | ICD-10-CM | POA: Diagnosis not present

## 2021-04-04 DIAGNOSIS — I10 Essential (primary) hypertension: Secondary | ICD-10-CM | POA: Insufficient documentation

## 2021-04-04 DIAGNOSIS — I70235 Atherosclerosis of native arteries of right leg with ulceration of other part of foot: Secondary | ICD-10-CM | POA: Diagnosis not present

## 2021-04-04 DIAGNOSIS — I2510 Atherosclerotic heart disease of native coronary artery without angina pectoris: Secondary | ICD-10-CM | POA: Diagnosis not present

## 2021-04-04 DIAGNOSIS — L97519 Non-pressure chronic ulcer of other part of right foot with unspecified severity: Secondary | ICD-10-CM | POA: Insufficient documentation

## 2021-04-04 DIAGNOSIS — Z7982 Long term (current) use of aspirin: Secondary | ICD-10-CM | POA: Diagnosis not present

## 2021-04-04 DIAGNOSIS — Z79899 Other long term (current) drug therapy: Secondary | ICD-10-CM | POA: Insufficient documentation

## 2021-04-04 DIAGNOSIS — Z01810 Encounter for preprocedural cardiovascular examination: Secondary | ICD-10-CM | POA: Diagnosis not present

## 2021-04-04 HISTORY — PX: ABDOMINAL AORTOGRAM W/LOWER EXTREMITY: CATH118223

## 2021-04-04 LAB — POCT I-STAT, CHEM 8
BUN: 22 mg/dL (ref 8–23)
Calcium, Ion: 1.08 mmol/L — ABNORMAL LOW (ref 1.15–1.40)
Chloride: 102 mmol/L (ref 98–111)
Creatinine, Ser: 1.2 mg/dL (ref 0.61–1.24)
Glucose, Bld: 105 mg/dL — ABNORMAL HIGH (ref 70–99)
HCT: 33 % — ABNORMAL LOW (ref 39.0–52.0)
Hemoglobin: 11.2 g/dL — ABNORMAL LOW (ref 13.0–17.0)
Potassium: 4.7 mmol/L (ref 3.5–5.1)
Sodium: 137 mmol/L (ref 135–145)
TCO2: 28 mmol/L (ref 22–32)

## 2021-04-04 SURGERY — ABDOMINAL AORTOGRAM W/LOWER EXTREMITY
Anesthesia: LOCAL

## 2021-04-04 MED ORDER — IODIXANOL 320 MG/ML IV SOLN
INTRAVENOUS | Status: DC | PRN
  Administered 2021-04-04: 130 mL via INTRA_ARTERIAL

## 2021-04-04 MED ORDER — HEPARIN (PORCINE) IN NACL 1000-0.9 UT/500ML-% IV SOLN
INTRAVENOUS | Status: AC
  Filled 2021-04-04: qty 500

## 2021-04-04 MED ORDER — SODIUM CHLORIDE 0.9 % WEIGHT BASED INFUSION: 1.0000 mL/kg/h | INTRAVENOUS | Status: DC

## 2021-04-04 MED ORDER — SODIUM CHLORIDE 0.9 % IV SOLN: 250.0000 mL | INTRAVENOUS | Status: DC | PRN

## 2021-04-04 MED ORDER — SODIUM CHLORIDE 0.9 % IV SOLN: INTRAVENOUS | Status: DC

## 2021-04-04 MED ORDER — HYDRALAZINE HCL 20 MG/ML IJ SOLN: 5.0000 mg | INTRAMUSCULAR | Status: DC | PRN

## 2021-04-04 MED ORDER — LIDOCAINE HCL (PF) 1 % IJ SOLN
INTRAMUSCULAR | Status: DC | PRN
  Administered 2021-04-04: 18 mL

## 2021-04-04 MED ORDER — HEPARIN (PORCINE) IN NACL 1000-0.9 UT/500ML-% IV SOLN
INTRAVENOUS | Status: DC | PRN
  Administered 2021-04-04 (×2): 500 mL

## 2021-04-04 MED ORDER — SODIUM CHLORIDE 0.9% FLUSH: 3.0000 mL | Freq: Two times a day (BID) | INTRAVENOUS | Status: DC

## 2021-04-04 MED ORDER — ONDANSETRON HCL 4 MG/2ML IJ SOLN: 4.0000 mg | Freq: Four times a day (QID) | INTRAMUSCULAR | Status: DC | PRN

## 2021-04-04 MED ORDER — LIDOCAINE HCL (PF) 1 % IJ SOLN
INTRAMUSCULAR | Status: AC
  Filled 2021-04-04: qty 30

## 2021-04-04 MED ORDER — ACETAMINOPHEN 325 MG PO TABS: 650.0000 mg | ORAL_TABLET | ORAL | Status: DC | PRN

## 2021-04-04 MED ORDER — SODIUM CHLORIDE 0.9% FLUSH: 3.0000 mL | INTRAVENOUS | Status: DC | PRN

## 2021-04-04 MED ORDER — LABETALOL HCL 5 MG/ML IV SOLN: 10.0000 mg | INTRAVENOUS | Status: DC | PRN

## 2021-04-04 SURGICAL SUPPLY — 9 items
CATH OMNI FLUSH 5F 65CM (CATHETERS) ×1 IMPLANT
KIT MICROPUNCTURE NIT STIFF (SHEATH) ×1 IMPLANT
KIT PV (KITS) ×2 IMPLANT
SHEATH PINNACLE 5F 10CM (SHEATH) ×1 IMPLANT
SHEATH PROBE COVER 6X72 (BAG) ×1 IMPLANT
SYR MEDRAD MARK V 150ML (SYRINGE) ×1 IMPLANT
TRANSDUCER W/STOPCOCK (MISCELLANEOUS) ×2 IMPLANT
TRAY PV CATH (CUSTOM PROCEDURE TRAY) ×2 IMPLANT
WIRE BENTSON .035X145CM (WIRE) ×1 IMPLANT

## 2021-04-04 NOTE — Progress Notes (Signed)
Site Area: Left femoral artery Site Prior to Removal: Level 0 Pressure Applied For:   15 Mins Manual Pressure Pt Status During Pull:  Stable Post Pull Site: Level 0 Post Pull Instructions Given:  Yes Post Pull Pulses Present: Doppler Dressing Applied: Gauze and tegaderm Bedrest Begins @ 12:45

## 2021-04-04 NOTE — Op Note (Signed)
DATE OF SERVICE: 04/04/2021  PATIENT:  Joshua Curtis  70 y.o. male  PRE-OPERATIVE DIAGNOSIS:  Atherosclerosis of native arteries of right lower extremity causing ulceration  POST-OPERATIVE DIAGNOSIS:  Same  PROCEDURE:   1) US guided left common femoral artery access 2) Aortogram 3) Right lower extremity angiogram with third order cannulation (171m total contrast) 4) left lower extremity retrograde angiogram  SURGEON:  Surgeon(s) and Role:    * HCherre Robins MD - Primary  ASSISTANT: none  ANESTHESIA:   local  EBL: min  BLOOD ADMINISTERED:none  DRAINS: none   LOCAL MEDICATIONS USED:  LIDOCAINE   SPECIMEN:  none  COUNTS: confirmed correct.  TOURNIQUET:  none  PATIENT DISPOSITION:  PACU - hemodynamically stable.   Delay start of Pharmacological VTE agent (>24hrs) due to surgical blood loss or risk of bleeding: no  INDICATION FOR PROCEDURE: Joshua SALZERis a 70y.o. male with non-healing right toe wound with non-invasive evidence of peripheral arterial disease.  He underwent right lower extremity recanalization of the popliteal occlusion with Dr. BTrula Sladein November 2021. After careful discussion of risks, benefits, and alternatives the patient was offered angiography. We specifically discussed access site complication. The patient understood and wished to proceed.  OPERATIVE FINDINGS:  Aortogram No flow-limiting stenosis through the aorta or iliac arteries  Right lower extremity angiogram Common femoral artery patent Dense atherosclerotic disease about the femoral bifurcation with only mild stenosis Superficial femoral artery diffusely diseased but widely patent Femoral-popliteal stenting visualized distal superficial femoral artery. Stenting is patent to just above the knee joint Delayed angiogram revealed opacification of the peroneal artery in the proximal calf Peroneal artery courses to the ankle and fills the foot  Left lower extremity  angiogram Common femoral artery and its bifurcation widely patent Superficial femoral artery widely patent to femoral-popliteal stenting Stenting widely patent to the knee joint Below the knee popliteal artery fills the peroneal artery only, which courses to the ankle is the only runoff to the foot  DESCRIPTION OF PROCEDURE: After identification of the patient in the pre-operative holding area, the patient was transferred to the operating room. The patient was positioned supine on the operating room table. Anesthesia was induced. The groins was prepped and draped in standard fashion. A surgical pause was performed confirming correct patient, procedure, and operative location.  The left groin was anesthetized with subcutaneous injection of 1% lidocaine. Using ultrasound guidance, the left common femoral artery was accessed with micropuncture technique. Fluoroscopy was used to confirm cannulation over the femoral head. Sheathogram was not performed. The 51F sheath was upsized to 91F.   An 035 glidewire advantage was advanced into the distal aorta. Over the wire an omni flush catheter was advanced to the level of L2. Aortogram was performed - see above for details.   The right common iliac artery was selected with the 035 glidewire advantage. The wire was advanced into the common femoral artery. Over the wire the omni flush catheter was advanced into the external iliac artery. Selective angiography was performed - see above for details.   The 91F sheath was left in place with plans to remove this in recovery.  Upon completion of the case instrument and sharps counts were confirmed correct. The patient was transferred to the PACU in good condition. I was present for all portions of the procedure.  PLAN: needs right femoral - peroneal bypass for limb salvage. Will check vein mapping prior to discharge. Continue ASA / Statin.   TYevonne Aline HStanford Breed  MD [facility]  Phone Number: [phone] 04/04/2021 11:57 AM

## 2021-04-04 NOTE — Progress Notes (Signed)
Lower extremity vein mapping has been completed.   Preliminary results in CV Proc.   Joshua Curtis 04/04/2021 2:09 PM

## 2021-04-05 ENCOUNTER — Other Ambulatory Visit: Payer: Self-pay

## 2021-04-09 ENCOUNTER — Other Ambulatory Visit: Payer: Self-pay | Admitting: Podiatry

## 2021-04-09 ENCOUNTER — Encounter: Payer: Medicare Other | Admitting: Podiatry

## 2021-04-09 MED ORDER — HYDROCODONE-ACETAMINOPHEN 5-325 MG PO TABS: 1.0000 | ORAL_TABLET | Freq: Four times a day (QID) | ORAL | 0 refills | Status: DC | PRN

## 2021-04-09 NOTE — Progress Notes (Signed)
Subjective: 70 year old male presents the office today for follow-up evaluation of a wound on the right big toe.  He is scheduled for angio tomorrow as he recently saw vascular surgery earlier today.  He states the toes do not the same and no increase in swelling or redness or any drainage or pus.    He has a history of an ulcer to the toenail was sent to the wound care center and the wound did eventually heal he reports.  He then followed up with Dr. Paulla Dolly on March 14, 2021 and was placed on doxycycline.  He continued to have pain and swelling and followed back up with me.  There was infection of the toenail and nail was loose which was removed due to infection and underlying bone was exposed.  He was placed on further antibiotics and at that time he had to go out of town for 3 funerals.  He presents today for follow-up evaluation.  Continues to have pain.  Objective: AAO x3, NAD On the right hallux there is a still a wound present at the distal portion of the toe and there is an area of exposed distal phalanx.  There is no drainage or pus.  There is minimal edema.  There is no significant erythema or warmth.  No ascending cellulitis.  No fluctuance or crepitation.  The wound appears to be stable. No pain with calf compression, swelling, warmth, erythema  Assessment: Right hallux ulceration, osteomyelitis  Plan: -All treatment options discussed with the patient including all alternatives, risks, complications.  -His wife is present today for discussion as well.  This time given the swelling of exposed bone I do recommend amputation of his part of the toe however he has had vascular intervention prior to this.  He is to keep a close monitor on the wound for any worsening to ensure there is any increased signs or symptoms of infection as reported the emergency department.  Trula Slade DPM

## 2021-04-10 ENCOUNTER — Telehealth: Payer: Self-pay | Admitting: *Deleted

## 2021-04-10 NOTE — Telephone Encounter (Signed)
-----   Message from Trula Slade, DPM sent at 04/09/2021  5:51 PM EDT ----- Regarding: RE: Pain pill Sent vicodin  ----- Message ----- From: Rivka Barbara Sent: 04/09/2021   3:20 PM EDT To: Viviana Simpler, PMAC, Trula Slade, DPM Subject: Pain pill                                      Patient came in today requesting a pain pill. He states that he is not able to get proper rest due to the pain. The pharmacy of choice is [id number] [place]

## 2021-04-10 NOTE — Telephone Encounter (Signed)
Called and spoke with the patient and relayed the message per Dr Jacqualyn Posey. Lattie Haw

## 2021-04-12 ENCOUNTER — Encounter (HOSPITAL_COMMUNITY): Payer: Self-pay | Admitting: Surgery

## 2021-04-12 ENCOUNTER — Other Ambulatory Visit: Payer: Self-pay

## 2021-04-12 LAB — SARS CORONAVIRUS 2 (TAT 6-24 HRS): SARS Coronavirus 2: NEGATIVE

## 2021-04-12 NOTE — Progress Notes (Signed)
Spoke with pt for pre-op call. Pt denies cardiac history (chart states CAD noted on CT scan). Pt is treated for HTN and PAD. Pt states he is not diabetic.  Covid test done today. Results pending.

## 2021-04-13 ENCOUNTER — Encounter (HOSPITAL_COMMUNITY): Payer: Self-pay | Admitting: Surgery

## 2021-04-13 ENCOUNTER — Inpatient Hospital Stay (HOSPITAL_COMMUNITY): Payer: BC Managed Care – PPO

## 2021-04-13 ENCOUNTER — Inpatient Hospital Stay (HOSPITAL_COMMUNITY)
Admission: RE | Admit: 2021-04-13 | Discharge: 2021-04-18 | DRG: 271 | Disposition: A | Discharge status: Home / Self Care | Payer: BC Managed Care – PPO | Attending: Surgery | Admitting: Surgery

## 2021-04-13 ENCOUNTER — Encounter (HOSPITAL_COMMUNITY)
Admission: RE | Disposition: A | Discharge status: Home / Self Care | Payer: Self-pay | Source: Home / Self Care | Attending: Surgery

## 2021-04-13 DIAGNOSIS — Z823 Family history of stroke: Secondary | ICD-10-CM

## 2021-04-13 DIAGNOSIS — I2510 Atherosclerotic heart disease of native coronary artery without angina pectoris: Secondary | ICD-10-CM | POA: Diagnosis not present

## 2021-04-13 DIAGNOSIS — L97519 Non-pressure chronic ulcer of other part of right foot with unspecified severity: Secondary | ICD-10-CM | POA: Diagnosis present

## 2021-04-13 DIAGNOSIS — I700 Atherosclerosis of aorta: Secondary | ICD-10-CM | POA: Diagnosis not present

## 2021-04-13 DIAGNOSIS — J449 Chronic obstructive pulmonary disease, unspecified: Secondary | ICD-10-CM | POA: Diagnosis present

## 2021-04-13 DIAGNOSIS — I70235 Atherosclerosis of native arteries of right leg with ulceration of other part of foot: Secondary | ICD-10-CM | POA: Diagnosis not present

## 2021-04-13 DIAGNOSIS — M869 Osteomyelitis, unspecified: Secondary | ICD-10-CM | POA: Diagnosis not present

## 2021-04-13 DIAGNOSIS — Z8719 Personal history of other diseases of the digestive system: Secondary | ICD-10-CM | POA: Diagnosis not present

## 2021-04-13 DIAGNOSIS — Z833 Family history of diabetes mellitus: Secondary | ICD-10-CM | POA: Diagnosis not present

## 2021-04-13 DIAGNOSIS — Z791 Long term (current) use of non-steroidal anti-inflammatories (NSAID): Secondary | ICD-10-CM | POA: Diagnosis not present

## 2021-04-13 DIAGNOSIS — Z7902 Long term (current) use of antithrombotics/antiplatelets: Secondary | ICD-10-CM

## 2021-04-13 DIAGNOSIS — I739 Peripheral vascular disease, unspecified: Secondary | ICD-10-CM | POA: Diagnosis present

## 2021-04-13 DIAGNOSIS — Z8249 Family history of ischemic heart disease and other diseases of the circulatory system: Secondary | ICD-10-CM

## 2021-04-13 DIAGNOSIS — I10 Essential (primary) hypertension: Secondary | ICD-10-CM | POA: Diagnosis not present

## 2021-04-13 DIAGNOSIS — K219 Gastro-esophageal reflux disease without esophagitis: Secondary | ICD-10-CM | POA: Diagnosis present

## 2021-04-13 DIAGNOSIS — D62 Acute posthemorrhagic anemia: Secondary | ICD-10-CM | POA: Diagnosis not present

## 2021-04-13 DIAGNOSIS — I2721 Secondary pulmonary arterial hypertension: Secondary | ICD-10-CM | POA: Diagnosis not present

## 2021-04-13 DIAGNOSIS — E7800 Pure hypercholesterolemia, unspecified: Secondary | ICD-10-CM | POA: Diagnosis not present

## 2021-04-13 DIAGNOSIS — Z7982 Long term (current) use of aspirin: Secondary | ICD-10-CM

## 2021-04-13 DIAGNOSIS — F17210 Nicotine dependence, cigarettes, uncomplicated: Secondary | ICD-10-CM | POA: Diagnosis present

## 2021-04-13 DIAGNOSIS — D509 Iron deficiency anemia, unspecified: Secondary | ICD-10-CM | POA: Diagnosis not present

## 2021-04-13 DIAGNOSIS — Z841 Family history of disorders of kidney and ureter: Secondary | ICD-10-CM

## 2021-04-13 DIAGNOSIS — Z79899 Other long term (current) drug therapy: Secondary | ICD-10-CM | POA: Diagnosis not present

## 2021-04-13 DIAGNOSIS — N400 Enlarged prostate without lower urinary tract symptoms: Secondary | ICD-10-CM | POA: Diagnosis not present

## 2021-04-13 DIAGNOSIS — I779 Disorder of arteries and arterioles, unspecified: Secondary | ICD-10-CM | POA: Diagnosis present

## 2021-04-13 HISTORY — PX: BYPASS GRAFT FEMORAL-PERONEAL: SHX5762

## 2021-04-13 HISTORY — PX: PATCH ANGIOPLASTY: SHX6230

## 2021-04-13 LAB — POCT I-STAT 7, (LYTES, BLD GAS, ICA,H+H)
Acid-Base Excess: 0 mmol/L (ref 0.0–2.0)
Acid-Base Excess: 0 mmol/L (ref 0.0–2.0)
Acid-base deficit: 1 mmol/L (ref 0.0–2.0)
Bicarbonate: 26.4 mmol/L (ref 20.0–28.0)
Bicarbonate: 25.7 mmol/L (ref 20.0–28.0)
Bicarbonate: 25.8 mmol/L (ref 20.0–28.0)
Calcium, Ion: 1.22 mmol/L (ref 1.15–1.40)
Calcium, Ion: 1.21 mmol/L (ref 1.15–1.40)
Calcium, Ion: 1.21 mmol/L (ref 1.15–1.40)
HCT: 25 % — ABNORMAL LOW (ref 39.0–52.0)
HCT: 20 % — ABNORMAL LOW (ref 39.0–52.0)
HCT: 21 % — ABNORMAL LOW (ref 39.0–52.0)
Hemoglobin: 8.5 g/dL — ABNORMAL LOW (ref 13.0–17.0)
Hemoglobin: 6.8 g/dL — CL (ref 13.0–17.0)
Hemoglobin: 7.1 g/dL — ABNORMAL LOW (ref 13.0–17.0)
O2 Saturation: 99 %
O2 Saturation: 100 %
O2 Saturation: 100 %
Patient temperature: 37
Patient temperature: 36.8
Patient temperature: 36.9
Potassium: 4.4 mmol/L (ref 3.5–5.1)
Potassium: 4.3 mmol/L (ref 3.5–5.1)
Potassium: 4.4 mmol/L (ref 3.5–5.1)
Sodium: 139 mmol/L (ref 135–145)
Sodium: 138 mmol/L (ref 135–145)
Sodium: 139 mmol/L (ref 135–145)
TCO2: 28 mmol/L (ref 22–32)
TCO2: 27 mmol/L (ref 22–32)
TCO2: 27 mmol/L (ref 22–32)
pCO2 arterial: 51 mmHg — ABNORMAL HIGH (ref 32.0–48.0)
pCO2 arterial: 49.6 mmHg — ABNORMAL HIGH (ref 32.0–48.0)
pCO2 arterial: 50.7 mmHg — ABNORMAL HIGH (ref 32.0–48.0)
pH, Arterial: 7.321 — ABNORMAL LOW (ref 7.350–7.450)
pH, Arterial: 7.314 — ABNORMAL LOW (ref 7.350–7.450)
pH, Arterial: 7.322 — ABNORMAL LOW (ref 7.350–7.450)
pO2, Arterial: 166 mmHg — ABNORMAL HIGH (ref 83.0–108.0)
pO2, Arterial: 195 mmHg — ABNORMAL HIGH (ref 83.0–108.0)
pO2, Arterial: 277 mmHg — ABNORMAL HIGH (ref 83.0–108.0)

## 2021-04-13 LAB — URINALYSIS, ROUTINE W REFLEX MICROSCOPIC
Bacteria, UA: NONE SEEN
Bilirubin Urine: NEGATIVE
Glucose, UA: NEGATIVE mg/dL
Hgb urine dipstick: NEGATIVE
Ketones, ur: NEGATIVE mg/dL
Nitrite: NEGATIVE
Protein, ur: NEGATIVE mg/dL
Specific Gravity, Urine: 1.019 (ref 1.005–1.030)
pH: 5 (ref 5.0–8.0)

## 2021-04-13 LAB — COMPREHENSIVE METABOLIC PANEL
ALT: 7 U/L (ref 0–44)
AST: 20 U/L (ref 15–41)
Albumin: 3.8 g/dL (ref 3.5–5.0)
Alkaline Phosphatase: 53 U/L (ref 38–126)
Anion gap: 7 (ref 5–15)
BUN: 17 mg/dL (ref 8–23)
CO2: 24 mmol/L (ref 22–32)
Calcium: 8.8 mg/dL — ABNORMAL LOW (ref 8.9–10.3)
Chloride: 107 mmol/L (ref 98–111)
Creatinine, Ser: 1.22 mg/dL (ref 0.61–1.24)
GFR, Estimated: >60 mL/min (ref >60)
Glucose, Bld: 93 mg/dL (ref 70–99)
Potassium: 4.1 mmol/L (ref 3.5–5.1)
Sodium: 138 mmol/L (ref 135–145)
Total Bilirubin: 0.2 mg/dL — ABNORMAL LOW (ref 0.3–1.2)
Total Protein: 7 g/dL (ref 6.5–8.1)

## 2021-04-13 LAB — POCT ACTIVATED CLOTTING TIME
Activated Clotting Time: 254 seconds
Activated Clotting Time: 190 seconds
Activated Clotting Time: 208 seconds
Activated Clotting Time: 196 seconds

## 2021-04-13 LAB — CBC
HCT: 35.8 % — ABNORMAL LOW (ref 39.0–52.0)
Hemoglobin: 11 g/dL — ABNORMAL LOW (ref 13.0–17.0)
MCH: 29.8 pg (ref 26.0–34.0)
MCHC: 30.7 g/dL (ref 30.0–36.0)
MCV: 97 fL (ref 80.0–100.0)
Platelets: 343 10*3/uL (ref 150–400)
RBC: 3.69 MIL/uL — ABNORMAL LOW (ref 4.22–5.81)
RDW: 13.7 % (ref 11.5–15.5)
WBC: 7.3 10*3/uL (ref 4.0–10.5)
nRBC: 0 % (ref 0.0–0.2)

## 2021-04-13 LAB — SURGICAL PCR SCREEN
MRSA, PCR: NEGATIVE
Staphylococcus aureus: NEGATIVE

## 2021-04-13 LAB — PROTIME-INR
INR: 1 (ref 0.8–1.2)
Prothrombin Time: 13.2 seconds (ref 11.4–15.2)

## 2021-04-13 LAB — PREPARE RBC (CROSSMATCH)

## 2021-04-13 LAB — APTT: aPTT: 34 seconds (ref 24–36)

## 2021-04-13 SURGERY — CREATION, BYPASS, ARTERIAL, FEMORAL TO PERONEAL, USING GRAFT
Anesthesia: General | Site: Leg Upper | Laterality: Right

## 2021-04-13 MED ORDER — SODIUM CHLORIDE 0.9 % IV SOLN: INTRAVENOUS | Status: DC

## 2021-04-13 MED ORDER — OXYCODONE HCL 5 MG/5ML PO SOLN
5.0000 mg | Freq: Once | ORAL | Status: DC | PRN
Start: 2021-04-13 — End: 2021-04-13

## 2021-04-13 MED ORDER — FENTANYL CITRATE (PF) 100 MCG/2ML IJ SOLN
INTRAMUSCULAR | Status: DC | PRN
  Administered 2021-04-13: 50 ug via INTRAVENOUS
  Administered 2021-04-13: 100 ug via INTRAVENOUS
  Administered 2021-04-13 (×7): 50 ug via INTRAVENOUS

## 2021-04-13 MED ORDER — ROSUVASTATIN CALCIUM 20 MG PO TABS
40.0000 mg | ORAL_TABLET | Freq: Every day | ORAL | Status: DC
  Administered 2021-04-14 – 2021-04-18 (×5): 40 mg via ORAL
  Filled 2021-04-13 (×5): qty 2

## 2021-04-13 MED ORDER — HYDRALAZINE HCL 20 MG/ML IJ SOLN: 5.0000 mg | INTRAMUSCULAR | Status: DC | PRN

## 2021-04-13 MED ORDER — ONDANSETRON HCL 4 MG/2ML IJ SOLN: 4.0000 mg | Freq: Four times a day (QID) | INTRAMUSCULAR | Status: DC | PRN

## 2021-04-13 MED ORDER — HEPARIN SODIUM (PORCINE) 1000 UNIT/ML IJ SOLN
INTRAMUSCULAR | Status: AC
  Filled 2021-04-13: qty 1

## 2021-04-13 MED ORDER — HYDROMORPHONE HCL 1 MG/ML IJ SOLN
INTRAMUSCULAR | Status: AC
  Filled 2021-04-13: qty 0.5

## 2021-04-13 MED ORDER — ONDANSETRON HCL 4 MG/2ML IJ SOLN
INTRAMUSCULAR | Status: DC | PRN
  Administered 2021-04-13: 4 mg via INTRAVENOUS

## 2021-04-13 MED ORDER — ACETAMINOPHEN 650 MG RE SUPP: 325.0000 mg | RECTAL | Status: DC | PRN

## 2021-04-13 MED ORDER — DEXAMETHASONE SODIUM PHOSPHATE 10 MG/ML IJ SOLN
INTRAMUSCULAR | Status: DC | PRN
  Administered 2021-04-13: 5 mg via INTRAVENOUS

## 2021-04-13 MED ORDER — CLOPIDOGREL BISULFATE 75 MG PO TABS
75.0000 mg | ORAL_TABLET | Freq: Every day | ORAL | Status: DC
  Administered 2021-04-14 – 2021-04-18 (×5): 75 mg via ORAL
  Filled 2021-04-13 (×5): qty 1

## 2021-04-13 MED ORDER — PROPOFOL 10 MG/ML IV BOLUS
INTRAVENOUS | Status: DC | PRN
  Administered 2021-04-13: 60 mg via INTRAVENOUS
  Administered 2021-04-13: 120 mg via INTRAVENOUS

## 2021-04-13 MED ORDER — SUCCINYLCHOLINE CHLORIDE 200 MG/10ML IV SOSY
PREFILLED_SYRINGE | INTRAVENOUS | Status: DC | PRN
  Administered 2021-04-13: 120 mg via INTRAVENOUS

## 2021-04-13 MED ORDER — FERROUS SULFATE 325 (65 FE) MG PO TABS
325.0000 mg | ORAL_TABLET | Freq: Every day | ORAL | Status: DC
  Administered 2021-04-14 – 2021-04-18 (×5): 325 mg via ORAL
  Filled 2021-04-13 (×5): qty 1

## 2021-04-13 MED ORDER — DOCUSATE SODIUM 100 MG PO CAPS
100.0000 mg | ORAL_CAPSULE | Freq: Every day | ORAL | Status: DC
  Administered 2021-04-14 – 2021-04-18 (×5): 100 mg via ORAL
  Filled 2021-04-13 (×5): qty 1

## 2021-04-13 MED ORDER — ACETAMINOPHEN 325 MG PO TABS: 325.0000 mg | ORAL_TABLET | ORAL | Status: DC | PRN

## 2021-04-13 MED ORDER — CHLORHEXIDINE GLUCONATE CLOTH 2 % EX PADS: 6.0000 | MEDICATED_PAD | Freq: Once | CUTANEOUS | Status: DC

## 2021-04-13 MED ORDER — LACTATED RINGERS IV SOLN: INTRAVENOUS | Status: DC

## 2021-04-13 MED ORDER — HEMOSTATIC AGENTS (NO CHARGE) OPTIME
TOPICAL | Status: DC | PRN
  Administered 2021-04-13: 1 via TOPICAL

## 2021-04-13 MED ORDER — CHLORHEXIDINE GLUCONATE 0.12 % MT SOLN
15.0000 mL | Freq: Once | OROMUCOSAL | Status: AC
  Administered 2021-04-13: 15 mL via OROMUCOSAL
  Filled 2021-04-13: qty 15

## 2021-04-13 MED ORDER — POTASSIUM CHLORIDE CRYS ER 20 MEQ PO TBCR: 20.0000 meq | EXTENDED_RELEASE_TABLET | Freq: Every day | ORAL | Status: DC | PRN

## 2021-04-13 MED ORDER — CHLORHEXIDINE GLUCONATE CLOTH 2 % EX PADS
6.0000 | MEDICATED_PAD | Freq: Every day | CUTANEOUS | Status: DC
  Administered 2021-04-14: 6 via TOPICAL

## 2021-04-13 MED ORDER — EPHEDRINE 5 MG/ML INJ
INTRAVENOUS | Status: AC
  Filled 2021-04-13: qty 5

## 2021-04-13 MED ORDER — PROTAMINE SULFATE 10 MG/ML IV SOLN
INTRAVENOUS | Status: DC | PRN
  Administered 2021-04-13: 10 mg via INTRAVENOUS
  Administered 2021-04-13: 25 mg via INTRAVENOUS
  Administered 2021-04-13 (×2): 20 mg via INTRAVENOUS

## 2021-04-13 MED ORDER — ROCURONIUM BROMIDE 10 MG/ML (PF) SYRINGE
PREFILLED_SYRINGE | INTRAVENOUS | Status: AC
  Filled 2021-04-13: qty 10

## 2021-04-13 MED ORDER — FENTANYL CITRATE (PF) 100 MCG/2ML IJ SOLN: 25.0000 ug | INTRAMUSCULAR | Status: DC | PRN

## 2021-04-13 MED ORDER — ALBUMIN HUMAN 5 % IV SOLN: INTRAVENOUS | Status: DC | PRN

## 2021-04-13 MED ORDER — PHENYLEPHRINE HCL-NACL 20-0.9 MG/250ML-% IV SOLN
INTRAVENOUS | Status: AC
  Filled 2021-04-13: qty 500

## 2021-04-13 MED ORDER — SODIUM CHLORIDE 0.9 % IV SOLN: 500.0000 mL | Freq: Once | INTRAVENOUS | Status: DC | PRN

## 2021-04-13 MED ORDER — CEFAZOLIN SODIUM 1 G IJ SOLR
INTRAMUSCULAR | Status: AC
  Filled 2021-04-13: qty 20

## 2021-04-13 MED ORDER — ROCURONIUM BROMIDE 10 MG/ML (PF) SYRINGE
PREFILLED_SYRINGE | INTRAVENOUS | Status: AC
  Filled 2021-04-13: qty 20

## 2021-04-13 MED ORDER — 0.9 % SODIUM CHLORIDE (POUR BTL) OPTIME
TOPICAL | Status: DC | PRN
  Administered 2021-04-13 (×2): 1000 mL

## 2021-04-13 MED ORDER — OXYCODONE-ACETAMINOPHEN 5-325 MG PO TABS
1.0000 | ORAL_TABLET | ORAL | Status: DC | PRN
  Administered 2021-04-13 – 2021-04-18 (×20): 2 via ORAL
  Filled 2021-04-13 (×20): qty 2

## 2021-04-13 MED ORDER — CEFAZOLIN SODIUM-DEXTROSE 2-4 GM/100ML-% IV SOLN
2.0000 g | Freq: Three times a day (TID) | INTRAVENOUS | Status: AC
  Administered 2021-04-13 – 2021-04-14 (×2): 2 g via INTRAVENOUS
  Filled 2021-04-13 (×2): qty 100

## 2021-04-13 MED ORDER — METOPROLOL TARTRATE 5 MG/5ML IV SOLN: 2.0000 mg | INTRAVENOUS | Status: DC | PRN

## 2021-04-13 MED ORDER — HEPARIN SODIUM (PORCINE) 1000 UNIT/ML IJ SOLN
INTRAMUSCULAR | Status: DC | PRN
  Administered 2021-04-13 (×2): 2000 [IU] via INTRAVENOUS
  Administered 2021-04-13: 1000 [IU] via INTRAVENOUS
  Administered 2021-04-13: 8000 [IU] via INTRAVENOUS

## 2021-04-13 MED ORDER — HEPARIN 6000 UNIT IRRIGATION SOLUTION
Status: DC | PRN
  Administered 2021-04-13: 1

## 2021-04-13 MED ORDER — SODIUM CHLORIDE 0.9 % IV SOLN
INTRAVENOUS | Status: DC | PRN
  Administered 2021-04-13: 50 ug/min via INTRAVENOUS
  Administered 2021-04-13: 40 ug/min via INTRAVENOUS

## 2021-04-13 MED ORDER — PANTOPRAZOLE SODIUM 40 MG PO TBEC
40.0000 mg | DELAYED_RELEASE_TABLET | Freq: Every day | ORAL | Status: DC
  Administered 2021-04-14 – 2021-04-18 (×5): 40 mg via ORAL
  Filled 2021-04-13 (×5): qty 1

## 2021-04-13 MED ORDER — AMLODIPINE BESYLATE 5 MG PO TABS
2.5000 mg | ORAL_TABLET | Freq: Every day | ORAL | Status: DC
  Administered 2021-04-14 – 2021-04-18 (×5): 2.5 mg via ORAL
  Filled 2021-04-13 (×5): qty 1

## 2021-04-13 MED ORDER — ALUM & MAG HYDROXIDE-SIMETH 200-200-20 MG/5ML PO SUSP: 15.0000 mL | ORAL | Status: DC | PRN

## 2021-04-13 MED ORDER — CIPROFLOXACIN HCL 500 MG PO TABS
500.0000 mg | ORAL_TABLET | Freq: Two times a day (BID) | ORAL | Status: AC
  Administered 2021-04-13 – 2021-04-15 (×5): 500 mg via ORAL
  Filled 2021-04-13 (×5): qty 1

## 2021-04-13 MED ORDER — BENAZEPRIL HCL 5 MG PO TABS
10.0000 mg | ORAL_TABLET | Freq: Every day | ORAL | Status: DC
  Administered 2021-04-13 – 2021-04-18 (×6): 10 mg via ORAL
  Filled 2021-04-13 (×6): qty 2

## 2021-04-13 MED ORDER — HYDROCHLOROTHIAZIDE 12.5 MG PO CAPS
12.5000 mg | ORAL_CAPSULE | Freq: Every day | ORAL | Status: DC
  Administered 2021-04-14 – 2021-04-18 (×5): 12.5 mg via ORAL
  Filled 2021-04-13 (×5): qty 1

## 2021-04-13 MED ORDER — FENTANYL CITRATE (PF) 250 MCG/5ML IJ SOLN
INTRAMUSCULAR | Status: AC
  Filled 2021-04-13: qty 5

## 2021-04-13 MED ORDER — EPHEDRINE SULFATE-NACL 50-0.9 MG/10ML-% IV SOSY
PREFILLED_SYRINGE | INTRAVENOUS | Status: DC | PRN
  Administered 2021-04-13 (×2): 5 mg via INTRAVENOUS

## 2021-04-13 MED ORDER — ORAL CARE MOUTH RINSE: 15.0000 mL | Freq: Once | OROMUCOSAL | Status: AC

## 2021-04-13 MED ORDER — HEPARIN 6000 UNIT IRRIGATION SOLUTION
Status: AC
  Filled 2021-04-13: qty 500

## 2021-04-13 MED ORDER — MAGNESIUM SULFATE 2 GM/50ML IV SOLN: 2.0000 g | Freq: Every day | INTRAVENOUS | Status: DC | PRN

## 2021-04-13 MED ORDER — CEFAZOLIN SODIUM-DEXTROSE 2-4 GM/100ML-% IV SOLN
2.0000 g | INTRAVENOUS | Status: AC
  Administered 2021-04-13 (×2): 2 g via INTRAVENOUS
  Filled 2021-04-13: qty 100

## 2021-04-13 MED ORDER — ASPIRIN EC 81 MG PO TBEC
81.0000 mg | DELAYED_RELEASE_TABLET | Freq: Every day | ORAL | Status: DC
  Administered 2021-04-14 – 2021-04-18 (×5): 81 mg via ORAL
  Filled 2021-04-13 (×5): qty 1

## 2021-04-13 MED ORDER — LABETALOL HCL 5 MG/ML IV SOLN: 10.0000 mg | INTRAVENOUS | Status: DC | PRN

## 2021-04-13 MED ORDER — PHENYLEPHRINE 40 MCG/ML (10ML) SYRINGE FOR IV PUSH (FOR BLOOD PRESSURE SUPPORT)
PREFILLED_SYRINGE | INTRAVENOUS | Status: AC
  Filled 2021-04-13: qty 10

## 2021-04-13 MED ORDER — PHENOL 1.4 % MT LIQD: 1.0000 | OROMUCOSAL | Status: DC | PRN

## 2021-04-13 MED ORDER — GUAIFENESIN-DM 100-10 MG/5ML PO SYRP: 15.0000 mL | ORAL_SOLUTION | ORAL | Status: DC | PRN

## 2021-04-13 MED ORDER — ONDANSETRON HCL 4 MG/2ML IJ SOLN: 4.0000 mg | Freq: Once | INTRAMUSCULAR | Status: DC | PRN

## 2021-04-13 MED ORDER — PHENYLEPHRINE HCL (PRESSORS) 10 MG/ML IV SOLN
INTRAVENOUS | Status: DC | PRN
  Administered 2021-04-13 (×8): 80 ug via INTRAVENOUS

## 2021-04-13 MED ORDER — HYDROMORPHONE HCL 1 MG/ML IJ SOLN
INTRAMUSCULAR | Status: DC | PRN
  Administered 2021-04-13: .25 mg via INTRAVENOUS

## 2021-04-13 MED ORDER — MORPHINE SULFATE (PF) 2 MG/ML IV SOLN
2.0000 mg | INTRAVENOUS | Status: DC | PRN
Start: 2021-04-13 — End: 2021-04-18
  Administered 2021-04-14 – 2021-04-15 (×4): 2 mg via INTRAVENOUS
  Filled 2021-04-13 (×4): qty 1

## 2021-04-13 MED ORDER — OXYCODONE HCL 5 MG PO TABS: 5.0000 mg | ORAL_TABLET | Freq: Once | ORAL | Status: DC | PRN

## 2021-04-13 MED ORDER — SUGAMMADEX SODIUM 200 MG/2ML IV SOLN
INTRAVENOUS | Status: DC | PRN
  Administered 2021-04-13: 200 mg via INTRAVENOUS

## 2021-04-13 MED ORDER — ROCURONIUM BROMIDE 10 MG/ML (PF) SYRINGE
PREFILLED_SYRINGE | INTRAVENOUS | Status: DC | PRN
  Administered 2021-04-13 (×4): 30 mg via INTRAVENOUS
  Administered 2021-04-13: 40 mg via INTRAVENOUS
  Administered 2021-04-13: 20 mg via INTRAVENOUS
  Administered 2021-04-13 (×2): 30 mg via INTRAVENOUS
  Administered 2021-04-13: 40 mg via INTRAVENOUS
  Administered 2021-04-13: 30 mg via INTRAVENOUS
  Administered 2021-04-13 (×2): 20 mg via INTRAVENOUS

## 2021-04-13 MED ORDER — LIDOCAINE 2% (20 MG/ML) 5 ML SYRINGE
INTRAMUSCULAR | Status: DC | PRN
  Administered 2021-04-13: 60 mg via INTRAVENOUS

## 2021-04-13 MED ORDER — SODIUM CHLORIDE 0.9 % IV SOLN: INTRAVENOUS | Status: DC | PRN

## 2021-04-13 SURGICAL SUPPLY — 66 items
ADH SKN CLS APL DERMABOND .7 (GAUZE/BANDAGES/DRESSINGS) ×4
AGENT HMST KT MTR STRL THRMB (HEMOSTASIS) ×2
BAG COUNTER SPONGE SURGICOUNT (BAG) ×2 IMPLANT
BAG SPNG CNTER NS LX DISP (BAG) ×1
BANDAGE ESMARK 6X9 LF (GAUZE/BANDAGES/DRESSINGS) IMPLANT
BNDG CMPR 9X6 STRL LF SNTH (GAUZE/BANDAGES/DRESSINGS) ×1
BNDG ESMARK 6X9 LF (GAUZE/BANDAGES/DRESSINGS) ×2
CANISTER SUCT 3000ML PPV (MISCELLANEOUS) ×2 IMPLANT
CANNULA VESSEL 3MM 2 BLNT TIP (CANNULA) ×1 IMPLANT
CATH EMB 4FR 40CM (CATHETERS) ×1 IMPLANT
CLIP VESOCCLUDE MED 24/CT (CLIP) ×2 IMPLANT
CLIP VESOCCLUDE SM WIDE 24/CT (CLIP) ×2 IMPLANT
CUFF TOURN SGL QUICK 24 (TOURNIQUET CUFF) ×2
CUFF TOURN SGL QUICK 34 (TOURNIQUET CUFF)
CUFF TOURN SGL QUICK 42 (TOURNIQUET CUFF) IMPLANT
CUFF TRNQT CYL 24X4X16.5-23 (TOURNIQUET CUFF) IMPLANT
CUFF TRNQT CYL 24X4X40X1 (TOURNIQUET CUFF) IMPLANT
CUFF TRNQT CYL 34X4.125X (TOURNIQUET CUFF) IMPLANT
DERMABOND ADVANCED (GAUZE/BANDAGES/DRESSINGS) ×4
DERMABOND ADVANCED .7 DNX12 (GAUZE/BANDAGES/DRESSINGS) IMPLANT
DRAIN CHANNEL 15F RND FF W/TCR (WOUND CARE) IMPLANT
DRAPE X-RAY CASS 24X20 (DRAPES) IMPLANT
ELECT REM PT RETURN 9FT ADLT (ELECTROSURGICAL) ×2
ELECTRODE REM PT RTRN 9FT ADLT (ELECTROSURGICAL) ×1 IMPLANT
EVACUATOR SILICONE 100CC (DRAIN) IMPLANT
GLOVE SURG POLYISO LF SZ7.5 (GLOVE) ×2 IMPLANT
GLOVE SURG UNDER POLY LF SZ6.5 (GLOVE) ×1 IMPLANT
GLOVE SURG UNDER POLY LF SZ7 (GLOVE) ×1 IMPLANT
GLOVE SURG UNDER POLY LF SZ7.5 (GLOVE) ×2 IMPLANT
GOWN STRL REUS W/ TWL LRG LVL3 (GOWN DISPOSABLE) ×2 IMPLANT
GOWN STRL REUS W/ TWL XL LVL3 (GOWN DISPOSABLE) ×1 IMPLANT
GOWN STRL REUS W/TWL LRG LVL3 (GOWN DISPOSABLE) ×8
GOWN STRL REUS W/TWL XL LVL3 (GOWN DISPOSABLE) ×2
GRAFT PROPATEN W/RING 6X80X60 (Vascular Products) ×1 IMPLANT
HEMOSTAT SNOW SURGICEL 2X4 (HEMOSTASIS) ×1 IMPLANT
KIT BASIN OR (CUSTOM PROCEDURE TRAY) ×2 IMPLANT
KIT TURNOVER KIT B (KITS) ×2 IMPLANT
MARKER GRAFT CORONARY BYPASS (MISCELLANEOUS) IMPLANT
NS IRRIG 1000ML POUR BTL (IV SOLUTION) ×4 IMPLANT
PACK PERIPHERAL VASCULAR (CUSTOM PROCEDURE TRAY) ×2 IMPLANT
PAD ARMBOARD 7.5X6 YLW CONV (MISCELLANEOUS) ×3 IMPLANT
SPONGE T-LAP 18X18 ~~LOC~~+RFID (SPONGE) ×1 IMPLANT
STOPCOCK 4 WAY LG BORE MALE ST (IV SETS) ×1 IMPLANT
SURGIFLO W/THROMBIN 8M KIT (HEMOSTASIS) ×2 IMPLANT
SUT ETHILON 3 0 PS 1 (SUTURE) IMPLANT
SUT PROLENE 4 0 RB 1 (SUTURE) ×2
SUT PROLENE 4-0 RB1 .5 CRCL 36 (SUTURE) IMPLANT
SUT PROLENE 5 0 C 1 24 (SUTURE) ×6 IMPLANT
SUT PROLENE 6 0 BV (SUTURE) ×6 IMPLANT
SUT SILK 2 0 (SUTURE) ×2
SUT SILK 2 0 SH (SUTURE) ×2 IMPLANT
SUT SILK 2-0 18XBRD TIE 12 (SUTURE) IMPLANT
SUT SILK 3 0 (SUTURE) ×8
SUT SILK 3-0 18XBRD TIE 12 (SUTURE) IMPLANT
SUT VIC AB 2-0 CT1 27 (SUTURE) ×6
SUT VIC AB 2-0 CT1 TAPERPNT 27 (SUTURE) ×2 IMPLANT
SUT VIC AB 3-0 SH 27 (SUTURE) ×14
SUT VIC AB 3-0 SH 27X BRD (SUTURE) ×2 IMPLANT
SUT VICRYL 4-0 PS2 18IN ABS (SUTURE) ×4 IMPLANT
SYR 5ML LL (SYRINGE) ×1 IMPLANT
TAPE UMBILICAL COTTON 1/8X30 (MISCELLANEOUS) ×1 IMPLANT
TOWEL GREEN STERILE (TOWEL DISPOSABLE) ×2 IMPLANT
TRAY FOLEY MTR SLVR 16FR STAT (SET/KITS/TRAYS/PACK) ×2 IMPLANT
TUBING EXTENTION W/L.L. (IV SETS) IMPLANT
UNDERPAD 30X36 HEAVY ABSORB (UNDERPADS AND DIAPERS) ×2 IMPLANT
WATER STERILE IRR 1000ML POUR (IV SOLUTION) ×2 IMPLANT

## 2021-04-13 NOTE — Progress Notes (Addendum)
Patient arrived from PACU on a hospital bed, assessment completed see flow sheet,1 unit of blood transfusing. Patient placed on tele ccmd notified patient oriented to room and staff bed in lowest position call bell within reach will continue to monitor.

## 2021-04-13 NOTE — Progress Notes (Signed)
   04/13/21 2330  Vitals  Temp 97.9 F (36.6 C)  Temp Source Oral  BP 130/83  MAP (mmHg) 99  BP Location Right Arm  BP Method Automatic  Patient Position (if appropriate) Lying  Pulse Rate Source Monitor  ECG Heart Rate (!) 112  Resp 14  Oxygen Therapy  SpO2 100 %  O2 Device Room Air  Art Line  Arterial Line BP 149/68  Arterial Line MAP (mmHg) 90 mmHg  Blood transfusion complete.

## 2021-04-13 NOTE — Anesthesia Preprocedure Evaluation (Addendum)
Anesthesia Evaluation  Patient identified by MRN, date of birth, ID band Patient awake    Reviewed: Allergy & Precautions, NPO status , Patient's Chart, lab work & pertinent test results  History of Anesthesia Complications Negative for: history of anesthetic complications  Airway Mallampati: II  TM Distance: >3 FB Neck ROM: Full    Dental  (+) Dental Advisory Given, Partial Upper, Partial Lower   Pulmonary COPD, Current Smoker and Patient abstained from smoking.,    Pulmonary exam normal        Cardiovascular hypertension, Pt. on medications pulmonary hypertension+ CAD and + Peripheral Vascular Disease  Normal cardiovascular exam   '22 Myoperfusion - The left ventricular ejection fraction is mildly decreased (45-54%). Nuclear stress EF: 48%. Blood pressure demonstrated a hypertensive response to exercise. Upsloping ST segment depression ST segment depression of 0.5 mm was noted during stress in the II and III leads, beginning at 5 minutes of stress, and returning to baseline after less than 1 minute of recovery. This is an intermediate risk study. Negative stress test for ischemia. There is evidence of apical thinning; there is no wall motion abnormality consistent with apical infarct. Prominent left ventricular hypertrophy on ECG.     Neuro/Psych negative neurological ROS  negative psych ROS   GI/Hepatic Neg liver ROS, GERD  Controlled,  Endo/Other  negative endocrine ROS  Renal/GU negative Renal ROS     Musculoskeletal  (+) Arthritis ,   Abdominal   Peds  Hematology  (+) anemia ,  On plavix    Anesthesia Other Findings Covid test negative   Reproductive/Obstetrics                            Anesthesia Physical Anesthesia Plan  ASA: 3  Anesthesia Plan: General   Post-op Pain Management:    Induction: Intravenous  PONV Risk Score and Plan: 2 and Treatment may vary due to  age or medical condition, Ondansetron and Dexamethasone  Airway Management Planned: Oral ETT  Additional Equipment: Arterial line  Intra-op Plan:   Post-operative Plan: Extubation in OR  Informed Consent: I have reviewed the patients History and Physical, chart, labs and discussed the procedure including the risks, benefits and alternatives for the proposed anesthesia with the patient or authorized representative who has indicated his/her understanding and acceptance.     Dental advisory given  Plan Discussed with: CRNA and Anesthesiologist  Anesthesia Plan Comments:        Anesthesia Quick Evaluation

## 2021-04-13 NOTE — Op Note (Signed)
Patient name: Joshua Curtis MRN: [id number] DOB: [birth year] Sex: male  04/13/2021 Pre-operative Diagnosis: Right toe ulcer Post-operative diagnosis:  Same Surgeon:  [name] Assistants:  Ivin Booty, PA Procedure:   #1: Right common femoral to peroneal artery bypass graft with composite 6 mm external ring PTFE and right saphenous vein   #2: Right external iliac, common femoral, and profundofemoral endarterectomy with vein patch angioplasty   #3: Harvest of right great saphenous vein   #4: Exposure of the right superficial femoral artery in the mid thigh Anesthesia:  General Blood Loss: 400 cc Specimens:  none  Findings: Extensive near occlusive plaque within the external iliac, femoral and profundofemoral artery.  I removed the external iliac and common femoral plaque.  I then removed the profunda plaque down to the main profunda branches.  A vein patch was used from the inguinal ligament down onto the profundofemoral artery for approximately 2 cm.  I harvested the saphenous vein from the groin down to the lower calf.  When the vein was dilated, half of it was sclerotic.  The vein was of good caliber from the groin to just above the knee.  Below that it was not usable.  I therefore used a PTFE graft coming off of the vein patch for the first half of the bypass and then the second half was with the vein which was sewn end to end to the PTFE graft and then end to side to a 2 mm disease-free peroneal artery.  I did exposed the superficial femoral artery in the mid thigh, proximal to the stents however I did not have enough vein to go from this area to the peroneal artery.  Indications: This is a 70 year old gentleman with a nonhealing right great toe wound.  He has previously undergone percutaneous revascularization to heal a toe wound.  He developed a new wound and then was found to have occlusion of his popliteal artery and proximal tibial vessels.  The peroneal artery reconstitutes and is  the dominant runoff.  I discussed with the patient in the holding area the indications for surgery and the details of the procedure and he wished to proceed.  Procedure:  The patient was identified in the holding area and taken to [place] [facility]  The patient was then placed supine on the table. general anesthesia was administered.  The patient was prepped and draped in the usual sterile fashion.  A time out was called and antibiotics were administered.  A PA was necessary to expedite the procedure and assist with technical details.  I began by making a medial incision in the mid calf to first explore the peroneal artery to see if it was an adequate target.  I first dissected out the saphenous vein within this incision and mobilized it throughout the length of the incision.  The vein was rather small but looked externally to be disease-free and so I fully dissected out.  I then protected the vein and expose the peroneal artery by taking down the soleal attachments to the tibia.  The peroneal artery appeared to be disease-free.  It was small measuring about 2 mm.  The artery was very deep within the wound behind the tibia.  Once I had the artery fully exposed, I proceeded up the leg, dissecting out the saphenous vein.  This was done through skip incisions.  There was a significant caliber change in the vein just proximal to the knee.  I then proceeded to dissect out  the vein up to the saphenofemoral junction.  The vein was adequate measuring 3 to 4 mm from the knee up to the groin.  Through one of the medial thigh incisions I dissected out the superficial femoral artery.  It had heavily calcified plaque.  I dissected it out just proximal to the previously placed stents.  I then ligated the saphenous vein in the lower leg with a silk tie and then at the saphenofemoral junction oversewing this with 5-0 Prolene in 2 layers.  The vein was then prepared on the back table.  The smaller caliber vein up to where it was  larger at the knee, was sclerotic and did not dilate.  It was not suitable for a conduit.  I then transected the vein where it became healthy and this distended nicely.  I measured this vein from the superficial femoral artery to the peroneal artery and I did not have adequate length for the bypass.  I felt that I needed to perform a composite graft and so I then dissected out the common femoral, profundofemoral, and superficial femoral artery in the groin.  There was bulky extensive plaque extending up under the inguinal ligament down into the profundofemoral artery well past the primary branches.  Once this was completed, I created a tunnel from the peroneal artery up to the groin with a long Gore tunneler.  The patient was then fully heparinized.  I then occluded the common femoral profundofemoral and superficial femoral artery.  A #11 blade was used to make an arteriotomy which was extended longitudinally.  There was extensive heavily calcified plaque extending up into the inguinal ligament and down well past the profunda bifurcation.  A Marlene Bast elevator was used to perform endarterectomy of the common femoral artery.  I then passed a #4 Fogarty for proximal control into the external iliac artery and used a hemostat to grab the plaque.  I was able to get all of this out and establish excellent inflow.  I then extended the arteriotomy down the profundofemoral artery to the main primary branches.  I was able to remove the plaque down to the branches and get a good distal endpoint.  Vein patch angioplasty was then performed of the common femoral and profundofemoral artery with a running 5-0 Prolene.  The patch went down on the profundofemoral artery for approximately 2 cm.  Once this was performed the clamps were released and there was excellent pulse in the groin.  I reoccluded the common femoral artery and used a #11 blade to open the patch anteriorly.  I selected a 6 mm external ring propatent PTFE graft  and beveled this to fit the size of the arteriotomy.  A running end-to-side anastomosis was created with 6-0 Prolene.  Once this was completed the clamps were released and there was excellent flow through the graft.  I then took the saphenous vein and measured how much vein would be required for the bypass.  I essentially transected the Gore-Tex graft approximately at the level of the distal thigh.  This was beveled as well as the vein graft and a end to end anastomosis was created with running 6-0 Prolene.  There was excellent flow in the bypass once the clamps were released.  Next, the bypass graft was brought through the previously created tunnel making sure to maintain proper orientation.  A tourniquet was then placed in the upper thigh and the leg was exsanguinated with an Esmarch.  The tourniquet was taken to 250 mm  of pressure.  I then opened the peroneal artery with a #11 blade and extended this longitudinally with Potts scissors.  The leg was straightened and the bypass was cut the appropriate length and beveled to fit the size arteriotomy.  A running anastomosis was created with 6-0 Prolene.  Just prior to completion the appropriate flushing maneuvers were performed and the anastomosis was completed.  Hand-held Doppler was used to evaluate the signal in the peroneal artery which was graft dependent and triphasic.  Next, the patient's heparin was reversed with 75 mg of protamine.  The wounds were irrigated.  The vein harvest incisions were closed with a deep layer 3-0 Vicryl followed by subcuticular closure.  The distal leg incision was closed by reapproximating the fascia with 2-0 Vicryl, subcutaneous tissue with 3-0 Vicryl, and the skin skin with 4-0 Vicryl.  The groin incision was closed by reapproximating the femoral sheath with 2-0 Vicryl, the subcutaneous tissue with additional layers of 2-0 and 3-0 Vicryl followed by subcuticular closure.  Dermabond was placed on the incisions.  The patient was  successfully extubated and taken recovery in stable condition.   Disposition: To PACU stable.   Theotis Burrow, M.D., [facility] [place] Office: [phone] Pager:  [phone]

## 2021-04-13 NOTE — Transfer of Care (Signed)
Immediate Anesthesia Transfer of Care Note  Patient: Joshua Curtis  Procedure(s) Performed: RIGHT LEG FEMORAL-PERONEAL BYPASS USING PROPATEN GRAFT (Right: Leg Upper) EXPOSURE SUPERFICIAL FEMORAL ARTERY, EXPOSURE POPITEAL ARTERTY (Right: Leg Upper) ILIEO-FEMORAL USING VEIN PATCH ANGIOPLASTY (Right: Leg Upper)  Patient Location: PACU  Anesthesia Type:General  Level of Consciousness: awake, alert , drowsy and patient cooperative  Airway & Oxygen Therapy: Patient Spontanous Breathing  Post-op Assessment: Report given to RN, Post -op Vital signs reviewed and stable and Patient moving all extremities X 4  Post vital signs: Reviewed and stable  Last Vitals:  Vitals Value Taken Time  BP 131/79 04/13/21 2041  Temp    Pulse 116 04/13/21 2048  Resp 18 04/13/21 2048  SpO2 100 % 04/13/21 2048  Vitals shown include unvalidated device data.  Last Pain:  Vitals:   04/13/21 0903  TempSrc:   PainSc: 7       Patients Stated Pain Goal: 3 ([id number])  Complications: No notable events documented.

## 2021-04-13 NOTE — Anesthesia Procedure Notes (Signed)
Arterial Line Insertion Start/End8/08/2021 10:35 AM Performed by: Janene Harvey, CRNA, CRNA  Patient location: Pre-op. Lidocaine 1% used for infiltration Left, radial was placed Catheter size: 20 G Hand hygiene performed  and maximum sterile barriers used  Allen's test indicative of satisfactory collateral circulation Attempts: 1 Procedure performed without using ultrasound guided technique. Following insertion, dressing applied and Biopatch. Post procedure assessment: unchanged  Patient tolerated the procedure well with no immediate complications.

## 2021-04-13 NOTE — Anesthesia Procedure Notes (Signed)
Procedure Name: Intubation Date/Time: 04/13/2021 11:00 AM Performed by: Annamary Carolin, CRNA Pre-anesthesia Checklist: Patient identified, Emergency Drugs available, Suction available and Patient being monitored Patient Re-evaluated:Patient Re-evaluated prior to induction Oxygen Delivery Method: Circle System Utilized Preoxygenation: Pre-oxygenation with 100% oxygen Induction Type: IV induction Ventilation: Mask ventilation without difficulty Grade View: Grade II Tube type: Oral Tube size: 7.5 mm Number of attempts: 2 Airway Equipment and Method: Stylet Placement Confirmation: ETT inserted through vocal cords under direct vision, positive ETCO2 and breath sounds checked- equal and bilateral Secured at: 22 cm Tube secured with: Tape Dental Injury: Teeth and Oropharynx as per pre-operative assessment  Comments: SRNA x2

## 2021-04-14 DIAGNOSIS — I70235 Atherosclerosis of native arteries of right leg with ulceration of other part of foot: Secondary | ICD-10-CM

## 2021-04-14 DIAGNOSIS — D62 Acute posthemorrhagic anemia: Secondary | ICD-10-CM

## 2021-04-14 DIAGNOSIS — Z7982 Long term (current) use of aspirin: Secondary | ICD-10-CM

## 2021-04-14 DIAGNOSIS — Z95828 Presence of other vascular implants and grafts: Secondary | ICD-10-CM

## 2021-04-14 DIAGNOSIS — L97519 Non-pressure chronic ulcer of other part of right foot with unspecified severity: Secondary | ICD-10-CM

## 2021-04-14 DIAGNOSIS — Z7902 Long term (current) use of antithrombotics/antiplatelets: Secondary | ICD-10-CM

## 2021-04-14 LAB — BASIC METABOLIC PANEL
Anion gap: 8 (ref 5–15)
BUN: 17 mg/dL (ref 8–23)
CO2: 25 mmol/L (ref 22–32)
Calcium: 8.2 mg/dL — ABNORMAL LOW (ref 8.9–10.3)
Chloride: 101 mmol/L (ref 98–111)
Creatinine, Ser: 1.1 mg/dL (ref 0.61–1.24)
GFR, Estimated: >60 mL/min (ref >60)
Glucose, Bld: 110 mg/dL — ABNORMAL HIGH (ref 70–99)
Potassium: 3.7 mmol/L (ref 3.5–5.1)
Sodium: 134 mmol/L — ABNORMAL LOW (ref 135–145)

## 2021-04-14 LAB — LIPID PANEL
Cholesterol: 64 mg/dL (ref 0–200)
HDL: 27 mg/dL — ABNORMAL LOW (ref >40)
LDL Cholesterol: 25 mg/dL (ref 0–99)
Total CHOL/HDL Ratio: 2.4 RATIO
Triglycerides: 59 mg/dL (ref <150)
VLDL: 12 mg/dL (ref 0–40)

## 2021-04-14 LAB — CBC
HCT: 25.4 % — ABNORMAL LOW (ref 39.0–52.0)
Hemoglobin: 8.4 g/dL — ABNORMAL LOW (ref 13.0–17.0)
MCH: 29.8 pg (ref 26.0–34.0)
MCHC: 33.1 g/dL (ref 30.0–36.0)
MCV: 90.1 fL (ref 80.0–100.0)
Platelets: 187 10*3/uL (ref 150–400)
RBC: 2.82 MIL/uL — ABNORMAL LOW (ref 4.22–5.81)
RDW: 14.5 % (ref 11.5–15.5)
WBC: 8.3 10*3/uL (ref 4.0–10.5)
nRBC: 0 % (ref 0.0–0.2)

## 2021-04-14 NOTE — Evaluation (Signed)
Physical Therapy Evaluation & Discharge Patient Details Name: Joshua Curtis MRN: [id number] DOB: 12/22/1950 Today's Date: 04/14/2021   History of Present Illness  Pt is a 70 y.o. male admitted 04/13/21 and s/p R femoral endarterectomy with vein patch angioplasty. Pt also with R great toe wound. PMH includes HTN, PAD, GERD.   Clinical Impression  Patient evaluated by Physical Therapy with no further acute PT needs identified. PTA, pt independent, lives with supportive wife. Today, pt mod indep with transfers and ambulation using RW. Educ re: activity recommendations (HEP provided), precautions, positioning, importance of mobility. All education has been completed and the patient has no further questions. Acute PT is signing off. Thank you for this referral.    Follow Up Recommendations No PT follow up;Supervision - Intermittent    Equipment Recommendations  Rolling walker with 5" wheels;3in1 (PT)    Recommendations for Other Services       Precautions / Restrictions Precautions Precautions: Fall Restrictions Weight Bearing Restrictions: No      Mobility  Bed Mobility Overal bed mobility: Modified Independent Bed Mobility: Supine to Sit                Transfers Overall transfer level: Modified independent Equipment used: Rolling walker (2 wheeled) Transfers: Sit to/from Stand              Ambulation/Gait Ambulation/Gait assistance: Modified independent (Device/Increase time) Gait Distance (Feet): 350 Feet Assistive device: Rolling walker (2 wheeled) Gait Pattern/deviations: Step-through pattern;Decreased stride length;Decreased weight shift to right;Antalgic;Trunk flexed Gait velocity: Decreased   General Gait Details: Slow, antalgic gait with RW, intermittent cues for upright posture and increased step length/heel strike with RLE; gait mechanics improving with distance; pt mod indep  Stairs Stairs:  (pt declined)          Wheelchair Mobility     Modified Rankin (Stroke Patients Only)       Balance Overall balance assessment: Needs assistance Sitting-balance support: Feet supported Sitting balance-Leahy Scale: Good     Standing balance support: Bilateral upper extremity supported;No upper extremity supported;During functional activity Standing balance-Leahy Scale: Fair Standing balance comment: can static stand without UE support                             Pertinent Vitals/Pain Pain Assessment: Faces Faces Pain Scale: Hurts little more Pain Location: R foot > RLE incision Pain Descriptors / Indicators: Discomfort;Sore Pain Intervention(s): Monitored during session    Home Living Family/patient expects to be discharged to:: Private residence Living Arrangements: Spouse/significant other Available Help at Discharge: Family;Available 24 hours/day Type of Home: House Home Access: Level entry     Home Layout: One level;Other (Comment) (2 steps to family room w/ rail) Home Equipment: Kasandra Knudsen - single point      Prior Function Level of Independence: Independent         Comments: Use of SPC for community/outdoor ambulation     Hand Dominance   Dominant Hand: Right    Extremity/Trunk Assessment   Upper Extremity Assessment Upper Extremity Assessment: Overall WFL for tasks assessed    Lower Extremity Assessment Lower Extremity Assessment: RLE deficits/detail RLE Deficits / Details: s/p RLE vascular intervention with medial incision from lower leg to groin, associated stiffness; functional strength >3/5; R foot wound       Communication   Communication: No difficulties  Cognition Arousal/Alertness: Awake/alert Behavior During Therapy: WFL for tasks assessed/performed Overall Cognitive Status: Within Functional Limits for tasks  assessed                                        General Comments General comments (skin integrity, edema, etc.): Wife present and supportive. Educ  pt and wife re: edema control, DVT prevention, importance of AROM and walking, HEP (handout provided), activity recommendations. Pt noted drainage at inicision site on R medial thigh, RN notified    Exercises Other Exercises Other Exercises: [employer] HEP handout provided (Access Code [email or web address]) and demonstrated - calf stretch w/ strap, seated hamstring stretch, groin stretch - for gentle ROM   Assessment/Plan    PT Assessment Patent does not need any further PT services  PT Problem List         PT Treatment Interventions      PT Goals (Current goals can be found in the Care Plan section)  Acute Rehab PT Goals PT Goal Formulation: All assessment and education complete, DC therapy    Frequency     Barriers to discharge        Co-evaluation               AM-PAC PT "6 Clicks" Mobility  Outcome Measure Help needed turning from your back to your side while in a flat bed without using bedrails?: None Help needed moving from lying on your back to sitting on the side of a flat bed without using bedrails?: None Help needed moving to and from a bed to a chair (including a wheelchair)?: None Help needed standing up from a chair using your arms (e.g., wheelchair or bedside chair)?: None Help needed to walk in hospital room?: None Help needed climbing 3-5 steps with a railing? : None 6 Click Score: 24    End of Session   Activity Tolerance: Patient tolerated treatment well Patient left: in chair;with call bell/phone within reach;with family/visitor present Nurse Communication: Mobility status PT Visit Diagnosis: Other abnormalities of gait and mobility (R26.89);Pain    Time: [id number] PT Time Calculation (min) (ACUTE ONLY): 32 min   Charges:   PT Evaluation $PT Eval Low Complexity: 1 Low PT Treatments $Gait Training: 8-22 mins    Mabeline Caras, PT, DPT Acute Rehabilitation Services  Pager [phone] Office [place] 04/14/2021, 5:15 PM

## 2021-04-14 NOTE — Evaluation (Signed)
Occupational Therapy Evaluation Patient Details Name: Joshua Curtis MRN: [id number] DOB: 10-Apr-1951 Today's Date: 04/14/2021    History of Present Illness 70 yo M POD #1, status post right femoral endarterectomy with vein patch angioplasty.  Also has R great toe wound.  PMH includes: GERD, hypertension, and peripheral artery disease s/p bilateral iliac stents.   Clinical Impression   Patient admitted for the diagnosis and procedure above.  PTA he lives with his spouse, who is able to assist as needed.  He is still employed at [employer], but not currently working.  He used a [facility] for mobility, and was independent with AD/IADL, and drove.  Barrier is post surgical pain.  OT will follow in the acute setting, but he should progress quickly once his soreness subsides.  No anticipate post acute OT needs.      Follow Up Recommendations  No OT follow up    Equipment Recommendations  None recommended by OT    Recommendations for Other Services       Precautions / Restrictions Precautions Precautions: Fall Restrictions Weight Bearing Restrictions: No      Mobility Bed Mobility Overal bed mobility: Needs Assistance Bed Mobility: Sit to Supine       Sit to supine: Min assist   General bed mobility comments: assist with R let onto the bed Patient Response: Cooperative  Transfers Overall transfer level: Needs assistance   Transfers: Sit to/from Stand Sit to Stand: Supervision         General transfer comment: cues to push from chair    Balance Overall balance assessment: Needs assistance Sitting-balance support: Feet supported Sitting balance-Leahy Scale: Good     Standing balance support: Bilateral upper extremity supported Standing balance-Leahy Scale: Fair Standing balance comment: able to static stand without RW                           ADL either performed or assessed with clinical judgement   ADL Overall ADL's : Needs  assistance/impaired Eating/Feeding: Independent   Grooming: Wash/dry hands;Wash/dry face;Modified independent;Standing       Lower Body Bathing: Minimal assistance;Sit to/from stand   Upper Body Dressing : Set up;Sitting   Lower Body Dressing: Minimal assistance;Sit to/from stand   Toilet Transfer: Supervision/safety;RW           Functional mobility during ADLs: Supervision/safety;Rolling walker       Vision Baseline Vision/History: Wears glasses Patient Visual Report: No change from baseline       Perception     Praxis      Pertinent Vitals/Pain Pain Assessment: Faces Faces Pain Scale: Hurts little more Pain Location: R leg Pain Descriptors / Indicators: Tender;Tightness Pain Intervention(s): Monitored during session     Hand Dominance Right   Extremity/Trunk Assessment Upper Extremity Assessment Upper Extremity Assessment: Overall WFL for tasks assessed   Lower Extremity Assessment Lower Extremity Assessment: Defer to PT evaluation   Cervical / Trunk Assessment Cervical / Trunk Assessment: Kyphotic   Communication Communication Communication: No difficulties   Cognition Arousal/Alertness: Awake/alert Behavior During Therapy: WFL for tasks assessed/performed Overall Cognitive Status: Within Functional Limits for tasks assessed                                     General Comments   VSS on RA    Exercises     Shoulder Instructions  Home Living Family/patient expects to be discharged to:: Private residence Living Arrangements: Spouse/significant other Available Help at Discharge: Family;Available 24 hours/day Type of Home: House Home Access: Level entry     Home Layout: One level;Other (Comment) (2 steps to family room)     Bathroom Shower/Tub: Teacher, early years/pre: Standard Bathroom Accessibility: Yes How Accessible: Accessible via walker Home Equipment: Cane - single point          Prior  Functioning/Environment Level of Independence: Independent        Comments: Used a SPC outside.        OT Problem List: Decreased range of motion;Impaired balance (sitting and/or standing);Increased edema;Pain      OT Treatment/Interventions: Self-care/ADL training;DME and/or AE instruction;Balance training;Patient/family education;Therapeutic activities    OT Goals(Current goals can be found in the care plan section) Acute Rehab OT Goals Patient Stated Goal: I have a family reunion in three weeks OT Goal Formulation: With patient Time For Goal Achievement: 04/28/21 Potential to Achieve Goals: Good ADL Goals Pt Will Perform Lower Body Bathing: Independently;sit to/from stand Pt Will Perform Lower Body Dressing: Independently;sit to/from stand Pt Will Transfer to Toilet: with modified independence;ambulating;regular height toilet  OT Frequency: Min 2X/week   Barriers to D/C:    none noted       Co-evaluation              AM-PAC OT "6 Clicks" Daily Activity     Outcome Measure Help from another person eating meals?: None Help from another person taking care of personal grooming?: None Help from another person toileting, which includes using toliet, bedpan, or urinal?: A Little Help from another person bathing (including washing, rinsing, drying)?: A Little Help from another person to put on and taking off regular upper body clothing?: None Help from another person to put on and taking off regular lower body clothing?: A Little 6 Click Score: 21   End of Session Equipment Utilized During Treatment: Rolling walker Nurse Communication: Mobility status  Activity Tolerance: Patient tolerated treatment well Patient left: in bed;with call bell/phone within reach  OT Visit Diagnosis: Unsteadiness on feet (R26.81);Pain Pain - Right/Left: Right Pain - part of body: Leg                Time: 1010-1035 OT Time Calculation (min): 25 min Charges:  OT General Charges $OT  Visit: 1 Visit OT Evaluation $OT Eval Moderate Complexity: 1 Mod OT Treatments $Self Care/Home Management : 8-22 mins  04/14/2021  Rich, OTR/L  Acute Rehabilitation Services  Office:  [phone]   Joshua Curtis 04/14/2021, 10:46 AM

## 2021-04-14 NOTE — Progress Notes (Signed)
    Subjective  - POD #1, status post right femoral endarterectomy with vein patch angioplasty and right femoral to peroneal bypass with composite graft  Has some discomfort in the right leg, but overall is feeling okay   Physical Exam:  Incisions are healing nicely. Brisk PT and DP Doppler signal       Assessment/Plan:  POD #1  Continue aspirin statin and Plavix Acute blood loss anemia: Received blood yesterday.  Hemoglobin is 8.4 today.  I will repeat this in the morning. Needs to be out of bed and ambulating.  Joshua Curtis 04/14/2021 10:03 AM --  Vitals:   04/14/21 0600 04/14/21 0846  BP: (!) 120/59 (!) 112/57  Pulse:  85  Resp: 16 13  Temp:  98 F (36.7 C)  SpO2: 98% 96%    Intake/Output Summary (Last 24 hours) at 04/14/2021 1003 Last data filed at 04/14/2021 [id number] Gross per 24 hour  Intake 4555 ml  Output 1205 ml  Net 3350 ml     Laboratory CBC    Component Value Date/Time   WBC 8.3 04/14/2021 0000   HGB 8.4 (L) 04/14/2021 0000   HCT 25.4 (L) 04/14/2021 0000   PLT 187 04/14/2021 0000    BMET    Component Value Date/Time   NA 134 (L) 04/14/2021 0000   K 3.7 04/14/2021 0000   CL 101 04/14/2021 0000   CO2 25 04/14/2021 0000   GLUCOSE 110 (H) 04/14/2021 0000   BUN 17 04/14/2021 0000   CREATININE 1.10 04/14/2021 0000   CALCIUM 8.2 (L) 04/14/2021 0000   GFRNONAA >60 04/14/2021 0000   GFRAA 100 11/24/2007 0956    COAG Lab Results  Component Value Date   INR 1.0 04/13/2021   No results found for: PTT  Antibiotics Anti-infectives (From admission, onward)    Start     Dose/Rate Route Frequency Ordered Stop   04/14/21 0000  ceFAZolin (ANCEF) IVPB 2g/100 mL premix        2 g 200 mL/hr over 30 Minutes Intravenous Every 8 hours 04/13/21 2226 04/14/21 0914   04/13/21 2315  ciprofloxacin (CIPRO) tablet 500 mg        500 mg Oral 2 times daily 04/13/21 2226     04/13/21 0811  ceFAZolin (ANCEF) IVPB 2g/100 mL premix        2 g 200 mL/hr over 30  Minutes Intravenous 30 min pre-op 04/13/21 0811 04/13/21 1719        V. Leia Alf, M.D., [facility] [place] Office: [phone] Pager:  [phone]

## 2021-04-14 NOTE — Interval H&P Note (Signed)
History and Physical Interval Note:  04/14/2021 9:55 PM  Joshua Curtis  has presented today for surgery, with the diagnosis of Atherosclerosis of native arteries of right lower extremity causing ulceration.  The various methods of treatment have been discussed with the patient and family. After consideration of risks, benefits and other options for treatment, the patient has consented to  Procedure(s): RIGHT LEG FEMORAL-PERONEAL BYPASS USING PROPATEN GRAFT (Right) EXPOSURE SUPERFICIAL FEMORAL ARTERY, EXPOSURE POPITEAL ARTERTY (Right) ILIEO-FEMORAL USING VEIN PATCH ANGIOPLASTY (Right) as a surgical intervention.  The patient's history has been reviewed, patient examined, no change in status, stable for surgery.  I have reviewed the patient's chart and labs.  Questions were answered to the patient's satisfaction.     [name]

## 2021-04-14 NOTE — Progress Notes (Signed)
BP remains stable, Joshua Curtis removed as ordered, foley catheter also removed, procedures well tolerated. Gauze dressing applied to Joshua Curtis site. Patient instructed to keep pressure dressing in place for 24 hours and notify RN for bleeding. Will continue to monitor.

## 2021-04-15 LAB — CBC
HCT: 22.7 % — ABNORMAL LOW (ref 39.0–52.0)
Hemoglobin: 7.8 g/dL — ABNORMAL LOW (ref 13.0–17.0)
MCH: 30.8 pg (ref 26.0–34.0)
MCHC: 34.4 g/dL (ref 30.0–36.0)
MCV: 89.7 fL (ref 80.0–100.0)
Platelets: 171 10*3/uL (ref 150–400)
RBC: 2.53 MIL/uL — ABNORMAL LOW (ref 4.22–5.81)
RDW: 14.6 % (ref 11.5–15.5)
WBC: 8.7 10*3/uL (ref 4.0–10.5)
nRBC: 0 % (ref 0.0–0.2)

## 2021-04-15 LAB — PREPARE RBC (CROSSMATCH)

## 2021-04-15 NOTE — Progress Notes (Signed)
Occupational Therapy Treatment Patient Details Name: Joshua Curtis MRN: [id number] DOB: 04/14/1951 Today's Date: 04/15/2021    History of present illness Pt is Joshua 70 y.o. male admitted 04/13/21 and s/p R femoral endarterectomy with vein patch angioplasty. Pt also with R great toe wound. PMH includes HTN, PAD, GERD.   OT comments  Joshua Curtis has met his acute OT goals with no further needs at this time. He completed bed mobility and functional room ambulation with mod I given RW, and toileting tasks and grooming with supervision for safety only this session. Pt was able to reach down and adjust his sock given increased time for pain management. D/c plan remain appropriate.    Follow Up Recommendations  No OT follow up    Equipment Recommendations  None recommended by OT       Precautions / Restrictions Precautions Precautions: Fall Restrictions Weight Bearing Restrictions: No       Mobility Bed Mobility Overal bed mobility: Modified Independent                  Transfers Overall transfer level: Modified independent                    Balance Overall balance assessment: Needs assistance Sitting-balance support: Feet supported Sitting balance-Leahy Scale: Good     Standing balance support: During functional activity;Single extremity supported Standing balance-Leahy Scale: Fair Standing balance comment: pt able to stand at the sink and groom without external support                           ADL either performed or assessed with clinical judgement   ADL Overall ADL's : Needs assistance/impaired     Grooming: Wash/dry hands;Wash/dry face;Modified independent;Standing                   Toilet Transfer: Supervision/safety;RW           Functional mobility during ADLs: Supervision/safety;Rolling walker General ADL Comments: Pt adjusted sock with incrased time for pain management, ambulated with RW in room and completed toilet transfer  wtih supervision Joshua for safety only - pt peforming more so at Joshua mod I level      Cognition Arousal/Alertness: Awake/alert Behavior During Therapy: WFL for tasks assessed/performed Overall Cognitive Status: Within Functional Limits for tasks assessed                    General Comments VSS on RA    Pertinent Vitals/ Pain       Pain Assessment: Faces Faces Pain Scale: Hurts little more Pain Location: R foot > RLE incision Pain Descriptors / Indicators: Discomfort;Sore Pain Intervention(s): Monitored during session;Limited activity within patient's tolerance   Frequency  Min 2X/week        Progress Toward Goals  OT Goals(current goals can now be found in the care plan section)  Progress towards OT goals: Goals met/education completed, patient discharged from OT  Acute Rehab OT Goals Patient Stated Goal: I have Joshua family reunion in three weeks OT Goal Formulation: With patient Time For Goal Achievement: 04/28/21 Potential to Achieve Goals: Good ADL Goals Pt Will Perform Lower Body Bathing: Independently;sit to/from stand Pt Will Perform Lower Body Dressing: Independently;sit to/from stand Pt Will Transfer to Toilet: with modified independence;ambulating;regular height toilet  Plan Discharge plan remains appropriate       AM-PAC OT "6 Clicks" Daily Activity     Outcome Measure   Help  from another person eating meals?: None Help from another person taking care of personal grooming?: None Help from another person toileting, which includes using toliet, bedpan, or urinal?: None Help from another person bathing (including washing, rinsing, drying)?: Joshua Little Help from another person to put on and taking off regular upper body clothing?: None Help from another person to put on and taking off regular lower body clothing?: Joshua Little 6 Click Score: 22    End of Session Equipment Utilized During Treatment: Rolling walker  OT Visit Diagnosis: Unsteadiness on feet  (R26.81);Pain Pain - Right/Left: Right Pain - part of body: Leg   Activity Tolerance Patient tolerated treatment well   Patient Left in bed;with call bell/phone within reach   Nurse Communication Mobility status        Time: 1275-1700 OT Time Calculation (min): 15 min  Charges: OT General Charges $OT Visit: 1 Visit OT Treatments $Self Care/Home Management : 8-22 mins     Joshua Curtis Joshua Curtis 04/15/2021, 4:36 PM

## 2021-04-15 NOTE — Progress Notes (Addendum)
   VASCULAR SURGERY ASSESSMENT & PLAN:   PAD with right great toe ulcer. POD 2 right femoral endarterectomy with vein patch angioplasty and right femoral to peroneal bypass with composite graft. RLE well perfused. VSS. Afebrile.    Continue asa, statin and Plavix.  Acute BLA: Hgb 7.8 down from 8.4.  Received one unit PCs yesterday. Slightly tachycardic and said he was light-headed after walking length of hallway yesterday.  Will give additional 1 unit PRBCs.  Dispo: home  SUBJECTIVE:   Complains of right great toe pain. Said he was light-headed after walking length of hallway yesterday. Up to BR without complaints. Tolerating diet.  PHYSICAL EXAM:   Vitals:   04/14/21 1956 04/14/21 2339 04/15/21 0419 04/15/21 0815  BP: 125/68 112/61 (!) 111/54 122/76  Pulse: 98 95 100   Resp: '20 12 12 16  '$ Temp: 98.6 F (37 C) 99 F (37.2 C) 99 F (37.2 C) 98.3 F (36.8 C)  TempSrc: Oral Oral Oral Oral  SpO2: 100% 100% 100% 100%  Weight:      Height:       General appearance: Awake, alert in no apparent distress Cardiac: Heart rate and rhythm are regular Respirations: Nonlabored Incisions: Right groin and lower leg incisions are all well approximated without bleeding or hematoma. Slight skin edge separation of prox thigh incision. Reinforced with Steri-strips. Extremities: Both feet are warm with intact sensation and motor function.  Right GT nail bed is dry. Pulse/Doppler exam:  Brisk right dorsalis pedis, posterior tibial artery Doppler signals   LABS:   Lab Results  Component Value Date   WBC 8.7 04/15/2021   HGB 7.8 (L) 04/15/2021   HCT 22.7 (L) 04/15/2021   MCV 89.7 04/15/2021   PLT 171 04/15/2021   Lab Results  Component Value Date   CREATININE 1.10 04/14/2021   Lab Results  Component Value Date   INR 1.0 04/13/2021   CBG (last 3)  No results for input(s): GLUCAP in the last 72 hours.  PROBLEM LIST:    Active Problems:   PAD (peripheral artery disease) (HCC)   PAOD  (peripheral arterial occlusive disease) (HCC)   CURRENT MEDS:    amLODipine  2.5 mg Oral Daily   And   benazepril  10 mg Oral Daily   aspirin EC  81 mg Oral Daily   Chlorhexidine Gluconate Cloth  6 each Topical Daily   ciprofloxacin  500 mg Oral BID   clopidogrel  75 mg Oral Daily   docusate sodium  100 mg Oral Daily   ferrous sulfate  325 mg Oral Q breakfast   hydrochlorothiazide  12.5 mg Oral Daily   pantoprazole  40 mg Oral Daily   rosuvastatin  40 mg Oral Daily   Barbie Banner, [place]  Office: [phone] 04/15/2021   I agree with the above.  I have seen and examined the patient.  He has got a good pedal signal.  Continue mobilization

## 2021-04-15 NOTE — Progress Notes (Addendum)
PHARMACIST LIPID MONITORING   Joshua Curtis is a 70 y.o. male admitted on 04/13/2021 for right common femoral bypass.  Pharmacy has been consulted to optimize lipid-lowering therapy with the indication of secondary prevention for clinical ASCVD.  Recent Labs:  Lipid Panel (last 6 months):   Lab Results  Component Value Date   CHOL 64 04/14/2021   TRIG 59 04/14/2021   HDL 27 (L) 04/14/2021   CHOLHDL 2.4 04/14/2021   VLDL 12 04/14/2021   LDLCALC 25 04/14/2021    Hepatic function panel (last 6 months):   Lab Results  Component Value Date   AST 20 04/13/2021   ALT 7 04/13/2021   ALKPHOS 53 04/13/2021   BILITOT 0.2 (L) 04/13/2021   BILIDIR 0.0 03/07/2021    SCr (since admission):   Serum creatinine: 1.1 mg/dL 04/14/21 0000 Estimated creatinine clearance: 66.4 mL/min  Current therapy and lipid therapy tolerance Current lipid-lowering therapy: Crestor 40 mg Previous lipid-lowering therapies (if applicable): Crestor 10 mg (was titrated to 40 mg but was still taking 10 mg) Documented or reported allergies or intolerances to lipid-lowering therapies (if applicable): n/a  Assessment:   Patient has already been optimized by vascular surgery to high intensity statin with Crestor 40 mg. Of note, LDL on 8/13 was 25.  Plan:    1.Statin intensity (high intensity recommended for all patients regardless of the LDL):  No statin changes. The patient is already on a high intensity statin.  2.Add ezetimibe (if any one of the following):   Not indicated at this time.  3.Refer to lipid clinic:   No  4.Follow-up with:  Primary care provider - Biagio Borg, MD  5.Follow-up labs after discharge:  Changes in lipid therapy were made. Check a lipid panel in 8-12 weeks then annually.     Laurey Arrow, PharmD PGY1 Pharmacy Resident 04/15/2021  10:08 AM  Please check [email or web address] for unit-specific pharmacy phone numbers.

## 2021-04-16 ENCOUNTER — Encounter (HOSPITAL_COMMUNITY): Payer: Self-pay | Admitting: Surgery

## 2021-04-16 LAB — BPAM RBC
Blood Product Expiration Date: 202209142359
Blood Product Expiration Date: 202209142359
Blood Product Expiration Date: 202209162359
ISSUE DATE / TIME: 202208121951
ISSUE DATE / TIME: 202208121951
ISSUE DATE / TIME: 202208141211
Unit Type and Rh: 5100
Unit Type and Rh: 5100
Unit Type and Rh: 5100

## 2021-04-16 LAB — CBC
HCT: 26.2 % — ABNORMAL LOW (ref 39.0–52.0)
Hemoglobin: 8.6 g/dL — ABNORMAL LOW (ref 13.0–17.0)
MCH: 29.6 pg (ref 26.0–34.0)
MCHC: 32.8 g/dL (ref 30.0–36.0)
MCV: 90 fL (ref 80.0–100.0)
Platelets: 182 10*3/uL (ref 150–400)
RBC: 2.91 MIL/uL — ABNORMAL LOW (ref 4.22–5.81)
RDW: 13.7 % (ref 11.5–15.5)
WBC: 11.1 10*3/uL — ABNORMAL HIGH (ref 4.0–10.5)
nRBC: 0 % (ref 0.0–0.2)

## 2021-04-16 LAB — TYPE AND SCREEN
ABO/RH(D): O POS
Antibody Screen: NEGATIVE
Unit division: 0
Unit division: 0
Unit division: 0

## 2021-04-16 NOTE — Progress Notes (Signed)
Mobility Specialist: Progress Note   04/16/21 1625  Mobility  Activity Ambulated in hall  Level of Assistance Modified independent, requires aide device or extra time  Assistive Device Front wheel walker  Distance Ambulated (ft) 370 ft  Mobility Ambulated with assistance in hallway  Mobility Response Tolerated well  Mobility performed by Mobility specialist  $Mobility charge 1 Mobility   Pre-Mobility: 101 HR, 122/71 BP, 100% SpO2 During Mobility: 121 HR Post-Mobility: 111 HR, 115/58 BP, 100% SpO2  Pt independent with bed mobility as well as to stand. Pt c/o 5/10 pain in his L calf during ambulation, otherwise asx throughout. Pt back to bed after walk per request with call bell and phone at his side. Pt requesting pain medication, RN notified.   [facility] Travonna Swindle Mobility Specialist Mobility Specialist Phone: [phone]

## 2021-04-16 NOTE — Progress Notes (Addendum)
Patient ambulated in hallway with rolling walker, 240 feet. While ambulating heart rate increased on monitor into 120s. After resting patient heart rate returned to low 100s. Call bell with in reach.  Cordelro Gautreau, Bettina Gavia RN

## 2021-04-16 NOTE — Progress Notes (Signed)
    Subjective  - POD #3, status post right femoral endarterectomy with vein patch angioplasty and right femoral to peroneal artery bypass graft with composite vein/graft  Received 1 unit of blood yesterday. No complaints this morning. Is ambulating in the hallway in his room   Physical Exam:  Incisions are clean dry and intact Brisk DP and PT Doppler signals       Assessment/Plan:  POD #3  Acute blood loss anemia: Hemoglobin is stable at 8.6 this morning.  I will repeat this again tomorrow if he stays in the hospital. Hypercholesterolemia: Continue statin therapy Antiplatelet: Patient is on aspirin Plavix. Patient can be discharged once his pain is controlled and he feels that he can take care of himself appropriately at home. Right toe wound: Followed by podiatry  [name] 04/16/2021 6:48 AM --  Vitals:   04/15/21 2320 04/16/21 0349  BP: 111/64 126/64  Pulse: 98 95  Resp: 18 18  Temp: 99.1 F (37.3 C) 98.5 F (36.9 C)  SpO2: 98% 97%    Intake/Output Summary (Last 24 hours) at 04/16/2021 0648 Last data filed at 04/15/2021 1545 Gross per 24 hour  Intake 412 ml  Output --  Net 412 ml     Laboratory CBC    Component Value Date/Time   WBC 11.1 (H) 04/16/2021 0114   HGB 8.6 (L) 04/16/2021 0114   HCT 26.2 (L) 04/16/2021 0114   PLT 182 04/16/2021 0114    BMET    Component Value Date/Time   NA 134 (L) 04/14/2021 0000   K 3.7 04/14/2021 0000   CL 101 04/14/2021 0000   CO2 25 04/14/2021 0000   GLUCOSE 110 (H) 04/14/2021 0000   BUN 17 04/14/2021 0000   CREATININE 1.10 04/14/2021 0000   CALCIUM 8.2 (L) 04/14/2021 0000   GFRNONAA >60 04/14/2021 0000   GFRAA 100 11/24/2007 0956    COAG Lab Results  Component Value Date   INR 1.0 04/13/2021   No results found for: PTT  Antibiotics Anti-infectives (From admission, onward)    Start     Dose/Rate Route Frequency Ordered Stop   04/14/21 0000  ceFAZolin (ANCEF) IVPB 2g/100 mL premix        2  g 200 mL/hr over 30 Minutes Intravenous Every 8 hours 04/13/21 2226 04/14/21 0914   04/13/21 2315  ciprofloxacin (CIPRO) tablet 500 mg        500 mg Oral 2 times daily 04/13/21 2226 04/15/21 2153   04/13/21 0811  ceFAZolin (ANCEF) IVPB 2g/100 mL premix        2 g 200 mL/hr over 30 Minutes Intravenous 30 min pre-op 04/13/21 0811 04/13/21 1719        V. Leia Alf, M.D., [facility] [place] Office: [phone] Pager:  [phone]

## 2021-04-17 LAB — CBC
HCT: 26.1 % — ABNORMAL LOW (ref 39.0–52.0)
Hemoglobin: 8.6 g/dL — ABNORMAL LOW (ref 13.0–17.0)
MCH: 30 pg (ref 26.0–34.0)
MCHC: 33 g/dL (ref 30.0–36.0)
MCV: 90.9 fL (ref 80.0–100.0)
Platelets: 190 10*3/uL (ref 150–400)
RBC: 2.87 MIL/uL — ABNORMAL LOW (ref 4.22–5.81)
RDW: 13.6 % (ref 11.5–15.5)
WBC: 10.3 10*3/uL (ref 4.0–10.5)
nRBC: 0 % (ref 0.0–0.2)

## 2021-04-17 NOTE — Progress Notes (Signed)
Mobility Specialist: Progress Note   04/17/21 1414  Mobility  Activity Ambulated in hall  Level of Assistance Modified independent, requires aide device or extra time  Assistive Device Front wheel walker  Distance Ambulated (ft) 470 ft  Mobility Ambulated with assistance in hallway  Mobility Response Tolerated well  Mobility performed by Mobility specialist  $Mobility charge 1 Mobility   Pre-Mobility: 105 HR, 119/55 BP, 100% SpO2 During Mobility: 119 HR Post-Mobility: 112 HR, 138/55 BP, 100% SpO2  Pt independent with bed mobility as well as to stand. Pt asx during ambulation. Pt back to bed after walk with call bell and phone at his side. Pt's wife is present in the room.   [facility] Joshua Curtis Mobility Specialist Mobility Specialist Phone: [phone]

## 2021-04-17 NOTE — Anesthesia Postprocedure Evaluation (Signed)
Anesthesia Post Note  Patient: Joshua Curtis  Procedure(s) Performed: RIGHT LEG FEMORAL-PERONEAL BYPASS USING PROPATEN GRAFT (Right: Leg Upper) EXPOSURE SUPERFICIAL FEMORAL ARTERY, EXPOSURE POPITEAL ARTERTY (Right: Leg Upper) ILIEO-FEMORAL USING VEIN PATCH ANGIOPLASTY (Right: Leg Upper)     Patient location during evaluation: PACU Anesthesia Type: General Level of consciousness: awake and alert Pain management: pain level controlled Vital Signs Assessment: post-procedure vital signs reviewed and stable Respiratory status: spontaneous breathing, nonlabored ventilation, respiratory function stable and patient connected to nasal cannula oxygen Cardiovascular status: blood pressure returned to baseline and stable Postop Assessment: no apparent nausea or vomiting Anesthetic complications: no   No notable events documented.  Last Vitals:  Vitals:   04/16/21 2310 04/17/21 0505  BP: 123/65 130/73  Pulse:    Resp: 16 18  Temp: 36.9 C 36.8 C  SpO2: 99% 100%    Last Pain:  Vitals:   04/17/21 0551  TempSrc:   PainSc: Asleep                 Rahim Astorga S

## 2021-04-17 NOTE — Progress Notes (Addendum)
  Progress Note    04/17/2021 7:54 AM 4 Days Post-Op  Subjective:  Believes he needs one more day before going home   Vitals:   04/17/21 0505 04/17/21 0752  BP: 130/73 (!) 151/82  Pulse:    Resp: 18 18  Temp: 98.2 F (36.8 C) 97.7 F (36.5 C)  SpO2: 100% 99%   Physical Exam: Lungs:  non labored Incisions:  R groin and distal thigh incision with some serous drainage Extremities:  R DP and peroneal signal by doppler; L ATA and PTA signal by doppler Neurologic: A&O  CBC    Component Value Date/Time   WBC 10.3 04/17/2021 0058   RBC 2.87 (L) 04/17/2021 0058   HGB 8.6 (L) 04/17/2021 0058   HCT 26.1 (L) 04/17/2021 0058   PLT 190 04/17/2021 0058   MCV 90.9 04/17/2021 0058   MCH 30.0 04/17/2021 0058   MCHC 33.0 04/17/2021 0058   RDW 13.6 04/17/2021 0058   LYMPHSABS 1,694 03/22/2021 1306   MONOABS 0.6 03/07/2021 1028   EOSABS 581 (H) 03/22/2021 1306   BASOSABS 49 03/22/2021 1306    BMET    Component Value Date/Time   NA 134 (L) 04/14/2021 0000   K 3.7 04/14/2021 0000   CL 101 04/14/2021 0000   CO2 25 04/14/2021 0000   GLUCOSE 110 (H) 04/14/2021 0000   BUN 17 04/14/2021 0000   CREATININE 1.10 04/14/2021 0000   CALCIUM 8.2 (L) 04/14/2021 0000   GFRNONAA >60 04/14/2021 0000   GFRAA 100 11/24/2007 0956    INR    Component Value Date/Time   INR 1.0 04/13/2021 0909    No intake or output data in the 24 hours ending 04/17/21 0754   Assessment/Plan:  70 y.o. male is s/p R iliofemoral endarterectomy and femoral to peroneal composite bypass 4 Days Post-Op   RLE well perfused with brisk pedal doppler signals Some thin drainage from groin and distal thigh incisions, continue daily and prn dressing changes Encouraged OOB today Tentative plan for d/c home tomorrow    Dagoberto Ligas, PA-C [facility] [phone] 04/17/2021 7:54 AM  I agree with the above.  I have seen and evaluated the patient.  Anticipate discharge home tomorrow.  [name]

## 2021-04-18 MED ORDER — OXYCODONE-ACETAMINOPHEN 5-325 MG PO TABS: 1.0000 | ORAL_TABLET | ORAL | 0 refills | Status: DC | PRN

## 2021-04-18 MED ORDER — ROSUVASTATIN CALCIUM 40 MG PO TABS: 40.0000 mg | ORAL_TABLET | Freq: Every day | ORAL | 3 refills | Status: DC

## 2021-04-18 NOTE — Discharge Instructions (Signed)
 [facility]  Discharge instructions  Lower Extremity Bypass Surgery  Please refer to the following instruction for your post-procedure care. Your surgeon or physician assistant will discuss any changes with you.  Activity  You are encouraged to walk as much as you can. You can slowly return to normal activities during the month after your surgery. Avoid strenuous activity and heavy lifting until your doctor tells you it's OK. Avoid activities such as vacuuming or swinging a golf club. Do not drive until your doctor give the OK and you are no longer taking prescription pain medications. It is also normal to have difficulty with sleep habits, eating and bowel movement after surgery. These will go away with time.  Bathing/Showering  You may shower after you go home. Do not soak in a bathtub, hot tub, or swim until the incision heals completely.  Incision Care  Clean your incision with mild soap and water. Shower every day. Pat the area dry with a clean towel. You do not need a bandage unless otherwise instructed. Do not apply any ointments or creams to your incision. If you have open wounds you will be instructed how to care for them or a visiting nurse may be arranged for you. If you have staples or sutures along your incision they will be removed at your post-op appointment. You may have skin glue on your incision. Do not peel it off. It will come off on its own in about one week. If you have a great deal of moisture in your groin, use a gauze help keep this area dry.  Diet  Resume your normal diet. There are no special food restrictions following this procedure. A low fat/ low cholesterol diet is recommended for all patients with vascular disease. In order to heal from your surgery, it is CRITICAL to get adequate nutrition. Your body requires vitamins, minerals, and protein. Vegetables are the best source of vitamins and minerals. Vegetables also provide the  perfect balance of protein. Processed food has little nutritional value, so try to avoid this.  Medications  Resume taking all your medications unless your doctor or nurse practitioner tells you not to. If your incision is causing pain, you may take over-the-counter pain relievers such as acetaminophen (Tylenol). If you were prescribed a stronger pain medication, please aware these medication can cause nausea and constipation. Prevent nausea by taking the medication with a snack or meal. Avoid constipation by drinking plenty of fluids and eating foods with high amount of fiber, such as fruits, vegetables, and grains. Take Colase 100 mg (an over-the-counter stool softener) twice a day as needed for constipation. Do not take Tylenol if you are taking prescription pain medications.  Follow Up  Our office will schedule a follow up appointment 2-3 weeks following discharge.  Please call us immediately for any of the following conditions  Severe or worsening pain in your legs or feet while at rest or while walking Increase pain, redness, warmth, or drainage (pus) from your incision site(s) Fever of 101 degree or higher The swelling in your leg with the bypass suddenly worsens and becomes more painful than when you were in the hospital If you have been instructed to feel your graft pulse then you should do so every day. If you can no longer feel this pulse, call the office immediately. Not all patients are given this instruction.  Leg swelling is common after leg bypass surgery.  The swelling should improve over a few months   following surgery. To improve the swelling, you may elevate your legs above the level of your heart while you are sitting or resting. Your surgeon or physician assistant may ask you to apply an ACE wrap or wear compression (TED) stockings to help to reduce swelling.  Reduce your risk of vascular disease  Stop smoking. If you would like help call [facility] at 1-800-QUIT-NOW  (1-800-784-8669) or [place] at 336-586-4000.  Manage your cholesterol Maintain a desired weight Control your diabetes weight Control your diabetes Keep your blood pressure down  If you have any questions, please call the office at [phone]   

## 2021-04-18 NOTE — Progress Notes (Signed)
  Progress Note    04/18/2021 7:43 AM 5 Days Post-Op  Subjective:  Reports bleeding from right great toe nail bed. Intermittent drainage from leg incisions.   Vitals:   04/17/21 2252 04/18/21 0431  BP:  122/62  Pulse: 87 100  Resp:  16  Temp: 98.1 F (36.7 C) 98 F (36.7 C)  SpO2:  97%    Physical Exam: General appearance: Awake, alert in no apparent distress Cardiac: Heart rate and rhythm are regular Respirations: Nonlabored Incisions: Right groin, thigh and lower leg incisions are all well approximated without bleeding or hematoma. No drainage currently Extremities: Mild RLE edema. Both feet are warm with intact sensation and motor function.  Pulse/Doppler exam: Brisk right dorsalis pedis, posterior tibial and peroneal artery Doppler signals   CBC    Component Value Date/Time   WBC 10.3 04/17/2021 0058   RBC 2.87 (L) 04/17/2021 0058   HGB 8.6 (L) 04/17/2021 0058   HCT 26.1 (L) 04/17/2021 0058   PLT 190 04/17/2021 0058   MCV 90.9 04/17/2021 0058   MCH 30.0 04/17/2021 0058   MCHC 33.0 04/17/2021 0058   RDW 13.6 04/17/2021 0058   LYMPHSABS 1,694 03/22/2021 1306   MONOABS 0.6 03/07/2021 1028   EOSABS 581 (H) 03/22/2021 1306   BASOSABS 49 03/22/2021 1306    BMET    Component Value Date/Time   NA 134 (L) 04/14/2021 0000   K 3.7 04/14/2021 0000   CL 101 04/14/2021 0000   CO2 25 04/14/2021 0000   GLUCOSE 110 (H) 04/14/2021 0000   BUN 17 04/14/2021 0000   CREATININE 1.10 04/14/2021 0000   CALCIUM 8.2 (L) 04/14/2021 0000   GFRNONAA >60 04/14/2021 0000   GFRAA 100 11/24/2007 0956     Intake/Output Summary (Last 24 hours) at 04/18/2021 0743 Last data filed at 04/18/2021 0103 Gross per 24 hour  Intake 120 ml  Output --  Net 120 ml    HOSPITAL MEDICATIONS Scheduled Meds:  amLODipine  2.5 mg Oral Daily   And   benazepril  10 mg Oral Daily   aspirin EC  81 mg Oral Daily   clopidogrel  75 mg Oral Daily   docusate sodium  100 mg Oral Daily   ferrous sulfate   325 mg Oral Q breakfast   hydrochlorothiazide  12.5 mg Oral Daily   pantoprazole  40 mg Oral Daily   rosuvastatin  40 mg Oral Daily   Continuous Infusions:  sodium chloride     magnesium sulfate bolus IVPB     PRN Meds:.sodium chloride, acetaminophen **OR** acetaminophen, alum & mag hydroxide-simeth, guaiFENesin-dextromethorphan, hydrALAZINE, labetalol, magnesium sulfate bolus IVPB, metoprolol tartrate, morphine injection, ondansetron, oxyCODONE-acetaminophen, phenol, potassium chloride  Assessment and Plan: 70 y.o. male is s/p R iliofemoral endarterectomy and femoral to peroneal composite bypass 5 Days Post-Op    RLE well perfused. Vss. Afebrile. Hgb stable. Ready for dc home. Discussed post-op edema and advised to avoid prolonged sitting with leg dependent. Elevate above heart level several times daily.  Continue asa, statin and Plavix  Risa Grill, PA-C [facility] [phone] 04/18/2021  7:43 AM

## 2021-04-19 ENCOUNTER — Encounter: Payer: Medicare Other | Admitting: Podiatry

## 2021-04-24 NOTE — Discharge Summary (Signed)
[facility] Discharge Summary Patient ID: Joshua Curtis [id number] 70 y.o. 17-Apr-1951  Admit date: 04/13/2021  Discharge date and time: 04/18/2021 12:27 PM   Admitting Physician: Serafina Mitchell, MD   Discharge Physician: Serafina Mitchell, MD  Admission Diagnoses: PAD (peripheral artery disease) ([place]) [I73.9] PAOD (peripheral arterial occlusive disease) ([place]) [I77.9]  Right toe ulcer Discharge Diagnoses: PAD (peripheral artery disease) ([place]) [I73.9] PAOD (peripheral arterial occlusive disease) ([place]) [I77.9] acute blood loss anemia  Admission Condition: good  Discharged Condition: good  Indication for Admission: Atherosclerosis of native arteries of right lower extremity causing ulceration  Hospital Course: On the day of admission the patient was taken to the operating room where he underwent right common femoral artery to peroneal artery bypass graft with composite 6 mm external ring PTFE and right saphenous vein.  He also underwent right external iliac, common femoral and profundofemoral endarterectomy with vein patch angioplasty, harvest of the right great saphenous vein and exposure of the right superficial femoral artery in the mid thigh.  Findings: Extensive near occlusive plaque within the external iliac, femoral and profundofemoral artery.  I removed the external iliac and common femoral plaque.  I then removed the profunda plaque down to the main profunda branches.  A vein patch was used from the inguinal ligament down onto the profundofemoral artery for approximately 2 cm.  I harvested the saphenous vein from the groin down to the lower calf.  When the vein was dilated, half of it was sclerotic.  The vein was of good caliber from the groin to just above the knee.  Below that it was not usable.  I therefore used a PTFE graft coming off of the vein patch for the first half of the bypass and then the second half was with the vein which was sewn end to end to the PTFE graft and then end  to side to a 2 mm disease-free peroneal artery.  I did exposed the superficial femoral artery in the mid thigh, proximal to the stents however I did not have enough vein to go from this area to the peroneal artery  He tolerated the procedure well was taken to the PACU in satisfactory condition.  On postoperative day 1, his vital signs are stable and he was afebrile.  His hemoglobin was 8.4 after receiving 1 unit of packed red blood cells on postoperative day 0.  On postoperative day 2, he complained of some degree of lightheadedness and dizziness when ambulating.  His hemoglobin had drifted to 7.8 and he was transfused an additional unit of packed red blood cells.  On postoperative day 3, his pain was improving and his statin and Plavix and aspirin were continued.  His right toe wound was stable.  He was ambulating without difficulty.  Over the next several days, attention was paid to mobility and pain control.  Had minor serous drainage from his right thigh saphenectomy site.  There were no signs of infection.  His right lower extremity remained well perfused with brisk Doppler signals.  Postoperative day 5, he was stable and in satisfactory condition for discharge home  Consults: None  Treatments: surgery: See above   Disposition: Discharge disposition: 01-Home or Self Care       - For [facility] Registry use ---  Post-op:  Wound infection: No  Graft infection: No  Transfusion: Yes  If yes, 2 units given New Arrhythmia: No Patency judged by: '[ ]'$  Dopper only, '[ ]'$  Palpable graft pulse, '[ ]'$  Palpable distal pulse, '[ ]'$   ABI inc. > 0.15, '[ ]'$  Duplex Discharge ABI: R , L  Discharge TBI: R , L  D/C Ambulatory Status: Ambulatory  Complications: MI: '[ ]'$  No, '[ ]'$  Troponin only, '[ ]'$  EKG or Clinical CHF: No Resp failure: '[ ]'$  none, '[ ]'$  Pneumonia, '[ ]'$  Ventilator Chg in renal function: '[ ]'$  none, '[ ]'$  Inc. Cr > 0.5, '[ ]'$  Temp. Dialysis, '[ ]'$  Permanent dialysis Stroke: '[ ]'$  None, '[ ]'$  Minor, '[ ]'$   Major Return to OR: No  Reason for return to OR: '[ ]'$  Bleeding, '[ ]'$  Infection, '[ ]'$  Thrombosis, '[ ]'$  Revision  Discharge medications: Statin use:  Yes ASA use:  Yes Plavix use:  Yes Beta blocker use: No  for medical reason not indicated Coumadin use: No  for medical reason not indicated    Patient Instructions:  Allergies as of 04/18/2021   No Known Allergies      Medication List     STOP taking these medications    amoxicillin-clavulanate 875-125 MG tablet Commonly known as: AUGMENTIN   ciprofloxacin 500 MG tablet Commonly known as: CIPRO       TAKE these medications    amLODipine-benazepril 10-40 MG capsule Commonly known as: LOTREL TAKE 1 CAPSULE BY MOUTH  DAILY   aspirin EC 81 MG tablet Take 81 mg by mouth daily. Swallow whole.   clopidogrel 75 MG tablet Commonly known as: PLAVIX Take 75 mg by mouth daily.   collagenase ointment Commonly known as: SANTYL Apply 1 application topically daily as needed (toe wound).   ferrous sulfate 325 (65 FE) MG tablet Take 1 tablet (325 mg total) by mouth daily with breakfast.   hydrochlorothiazide 12.5 MG capsule Commonly known as: MICROZIDE TAKE 1 CAPSULE BY MOUTH  DAILY   meloxicam 15 MG tablet Commonly known as: MOBIC Take 15 mg by mouth daily as needed for pain.   oxyCODONE-acetaminophen 5-325 MG tablet Commonly known as: PERCOCET/ROXICET Take 1-2 tablets by mouth every 4 (four) hours as needed for moderate pain.   povidone-iodine 10 % ointment Commonly known as: BETADINE Apply 1 application topically as needed for wound care (toe wound).   rosuvastatin 40 MG tablet Commonly known as: Crestor Take 1 tablet (40 mg total) by mouth daily. What changed: Another medication with the same name was removed. Continue taking this medication, and follow the directions you see here.       Activity: no lifting, driving, or strenuous exercise for 2 weeks Diet: cardiac diet Wound Care: keep wound clean and  dry  Follow-up with Dr. Trula Slade in 2 weeks.  Signed: Barbie Banner 04/24/2021 12:14 PM

## 2021-05-01 ENCOUNTER — Encounter (HOSPITAL_COMMUNITY): Payer: BC Managed Care – PPO

## 2021-05-03 ENCOUNTER — Encounter: Payer: Medicare Other | Admitting: Podiatry

## 2021-05-14 ENCOUNTER — Ambulatory Visit (INDEPENDENT_AMBULATORY_CARE_PROVIDER_SITE_OTHER): Payer: BC Managed Care – PPO | Admitting: Physician Assistant

## 2021-05-14 ENCOUNTER — Other Ambulatory Visit: Payer: Self-pay

## 2021-05-14 VITALS — BP 98/58 | HR 89 | Temp 98.0°F | Resp 18 | Ht 70.0 in | Wt 155.0 lb

## 2021-05-14 DIAGNOSIS — I739 Peripheral vascular disease, unspecified: Secondary | ICD-10-CM

## 2021-05-14 NOTE — Progress Notes (Signed)
  POST OPERATIVE OFFICE NOTE    CC:  F/u for surgery  HPI:  This is a 70 year old gentleman with a nonhealing right great toe wound.  He has previously undergone percutaneous revascularization to heal a toe wound.  He developed a new wound and then was found to have occlusion of his popliteal artery and proximal tibial vessels.  The peroneal artery reconstitutes and is the dominant runoff.   He is now s/p Right common femoral to peroneal artery bypass graft with composite 6 mm external ring PTFE and right saphenous vein  Pt returns today for follow up.  Pt states he denise fever and chills.  He has had ss drainage at the BK incision and the GT where the nail is absent.  He has been very active and denise rest pain and claudication pain.  He does have a fair amount of post revascularization edema in the left LE.    He is on Plavix, ASA and Crestor daily  No Known Allergies  Current Outpatient Medications  Medication Sig Dispense Refill   amLODipine-benazepril (LOTREL) 10-40 MG capsule TAKE 1 CAPSULE BY MOUTH  DAILY 90 capsule 3   aspirin EC 81 MG tablet Take 81 mg by mouth daily. Swallow whole.     clopidogrel (PLAVIX) 75 MG tablet Take 75 mg by mouth daily.     collagenase (SANTYL) ointment Apply 1 application topically daily as needed (toe wound).     ferrous sulfate 325 (65 FE) MG tablet Take 1 tablet (325 mg total) by mouth daily with breakfast. 90 tablet 0   hydrochlorothiazide (MICROZIDE) 12.5 MG capsule TAKE 1 CAPSULE BY MOUTH  DAILY (Patient taking differently: Take 12.5 mg by mouth daily.) 90 capsule 3   meloxicam (MOBIC) 15 MG tablet Take 15 mg by mouth daily as needed for pain.     oxyCODONE-acetaminophen (PERCOCET/ROXICET) 5-325 MG tablet Take 1-2 tablets by mouth every 4 (four) hours as needed for moderate pain. 30 tablet 0   povidone-iodine (BETADINE) 10 % ointment Apply 1 application topically as needed for wound care (toe wound).     rosuvastatin (CRESTOR) 40 MG tablet Take 1  tablet (40 mg total) by mouth daily. 90 tablet 3   No current facility-administered medications for this visit.     ROS:  See HPI  Physical Exam:    Incision:      There is no sign of infection. Extremities:  Brisk doppler signals in the peroneal/PT/DP left LE Groin and upper thigh incisions have fully healed.   Neuro: sensation grossly intact.    Assessment/Plan:  This is a 70 y.o. male who is s/p:Right common femoral to peroneal artery bypass graft with composite 6 mm external ring PTFE and right saphenous vein with prior Stent, left superficial femoral and popliteal artery.  I have instructed him the elevation will help with the edema and gave him a handout to demoonstrates the proper way to elevate.  We will start wet to dry dressing changes to the lower leg incisions and the GT.    He will f/u for wound checks in 2-3 weeks.  Our main goal is to heal the wounds.  He will need ABI's and bypass arterial studies in 6 months this will be scheduled once he has healed the incisions.     Roxy Horseman PA-C [facility] [phone]   Clinic MD:  Trula Slade

## 2021-05-28 LAB — PSA: PSA: <0.015

## 2021-06-02 ENCOUNTER — Other Ambulatory Visit: Payer: Self-pay | Admitting: Internal Medicine

## 2021-06-04 ENCOUNTER — Ambulatory Visit (INDEPENDENT_AMBULATORY_CARE_PROVIDER_SITE_OTHER): Payer: Self-pay | Admitting: Physician Assistant

## 2021-06-04 ENCOUNTER — Other Ambulatory Visit: Payer: Self-pay

## 2021-06-04 VITALS — BP 115/65 | HR 88 | Temp 98.0°F | Resp 16 | Ht 70.0 in | Wt 158.0 lb

## 2021-06-04 DIAGNOSIS — I739 Peripheral vascular disease, unspecified: Secondary | ICD-10-CM

## 2021-06-04 DIAGNOSIS — I70235 Atherosclerosis of native arteries of right leg with ulceration of other part of foot: Secondary | ICD-10-CM

## 2021-06-04 NOTE — Progress Notes (Signed)
Office Note     CC:  follow up Requesting Provider:  Biagio Borg, MD  HPI: Joshua Curtis is a 70 y.o. ([birth year]) male who presents for follow up of nonhealing right great toe wound. He recently underwent right common femoral to peroneal artery composite bypass with PTFE and right GSV on 04/13/21 by Dr. Trula Slade.   He was just seen in follow up on 9/12 at which time he had some dehiscence of right below knee incision with drainage as well as wound on the right great toe nail bed. His right leg was well perfused with brisk doppler signals and there were no concerns of infection. He did have some post revascularization edema in the leg and was instructed to elevate.   He returns today for close interval follow up to check on wounds.  He has been cleaning his right great toe wound and right leg incision with saline and placing wet to dry dressings. He see's Dr. Earleen Newport for his right toe wound. He reports no rest pain or claudication. The right toe is sore to the touch but otherwise no pain at rest. He does have a little decreased sensation in the right toes and foot since surgery as well as swelling in the right foot and leg. He elevates it daily and explains that it usually is gone upon first waking but then swells as the day goes on. He denies any fever or chills  The pt is on a statin for cholesterol management.  The pt is on a daily aspirin.   Other AC:  Plavix The pt is on HCTZ, CCB/ ACE for hypertension.   The pt is not diabetic.   Tobacco hx:  current, 1/4 ppd  Past Medical History:  Diagnosis Date   Anemia, iron deficiency 04/02/2016   Aortic atherosclerosis (HCC)    Arthritis    rt knee   BPH (benign prostatic hyperplasia) 04/10/2017   Cancer ([place])    prostate   COLONIC POLYPS, HX OF 11/24/2007   COPD (chronic obstructive pulmonary disease) (HCC)    pt denies this- no respiratory issues of any kind per pt   Coronary artery calcification seen on CAT scan    DIVERTICULOSIS, COLON  11/24/2007   ED (erectile dysfunction)    GERD (gastroesophageal reflux disease)    HLD (hyperlipidemia) 09/06/2020   HYPERTENSION 11/24/2007   Increased prostate specific antigen (PSA) velocity 06/22/2011   PAH (pulmonary artery hypertension) ([place])    Peripheral vascular disease ([place])     Past Surgical History:  Procedure Laterality Date   ABDOMINAL AORTOGRAM W/LOWER EXTREMITY N/A 07/04/2020   Procedure: ABDOMINAL AORTOGRAM W/LOWER EXTREMITY;  Surgeon: Serafina Mitchell, MD;  Location: [facility];  Service: Cardiovascular;  Laterality: N/A;   ABDOMINAL AORTOGRAM W/LOWER EXTREMITY N/A 07/18/2020   Procedure: ABDOMINAL AORTOGRAM W/LOWER EXTREMITY;  Surgeon: Serafina Mitchell, MD;  Location: [facility];  Service: Cardiovascular;  Laterality: N/A;   ABDOMINAL AORTOGRAM W/LOWER EXTREMITY N/A 04/04/2021   Procedure: ABDOMINAL AORTOGRAM W/LOWER EXTREMITY;  Surgeon: Cherre Robins, MD;  Location: [facility];  Service: Cardiovascular;  Laterality: N/A;   BYPASS GRAFT FEMORAL-PERONEAL Right 04/13/2021   Procedure: RIGHT LEG FEMORAL-PERONEAL BYPASS USING PROPATEN GRAFT;  Surgeon: Serafina Mitchell, MD;  Location: [facility];  Service: Vascular;  Laterality: Right;   COLONOSCOPY     NO PAST SURGERIES     PATCH ANGIOPLASTY Right 04/13/2021   Procedure: [facility] USING VEIN PATCH ANGIOPLASTY;  Surgeon: Harold Barban  W, MD;  Location: [facility];  Service: Vascular;  Laterality: Right;   PERIPHERAL VASCULAR BALLOON ANGIOPLASTY Right 07/18/2020   Procedure: PERIPHERAL VASCULAR BALLOON ANGIOPLASTY;  Surgeon: Serafina Mitchell, MD;  Location: [facility];  Service: Cardiovascular;  Laterality: Right;  tibioperoneal trunk and posterior tibial   PERIPHERAL VASCULAR INTERVENTION  07/04/2020   Procedure: PERIPHERAL VASCULAR INTERVENTION;  Surgeon: Serafina Mitchell, MD;  Location: [facility];  Service: Cardiovascular;;  Lt. SFA and Popliteal   PERIPHERAL VASCULAR INTERVENTION Right  07/18/2020   Procedure: PERIPHERAL VASCULAR INTERVENTION;  Surgeon: Serafina Mitchell, MD;  Location: [facility];  Service: Cardiovascular;  Laterality: Right;  Femoral Popliteal   ROBOT ASSISTED LAPAROSCOPIC RADICAL PROSTATECTOMY N/A 01/08/2021   Procedure: XI ROBOTIC ASSISTED LAPAROSCOPIC RADICAL PROSTATECTOMY LEVEL 3;  Surgeon: Raynelle Bring, MD;  Location: [facility];  Service: Urology;  Laterality: N/A;    Social History   Socioeconomic History   Marital status: Married    Spouse name: Not on file   Number of children: 2   Years of education: Not on file   Highest education level: Not on file  Occupational History   Not on file  Tobacco Use   Smoking status: Every Day    Packs/day: 0.25    Types: Cigarettes   Smokeless tobacco: Never   Tobacco comments:    tryimg to quit  Vaping Use   Vaping Use: Never used  Substance and Sexual Activity   Alcohol use: Not Currently    Comment: beer rare   Drug use: No   Sexual activity: Not Currently  Other Topics Concern   Not on file  Social History Narrative   Not on file   Social Determinants of Health   Financial Resource Strain: Not on file  Food Insecurity: Not on file  Transportation Needs: Not on file  Physical Activity: Not on file  Stress: Not on file  Social Connections: Not on file  Intimate Partner Violence: Not on file    Family History  Problem Relation Age of Onset   Stroke Mother    Diabetes Father    Hypertension Sister    Kidney failure Brother        dialysis   Diabetes Paternal Grandfather    Anuerysm Brother    Colon cancer Neg Hx    Esophageal cancer Neg Hx    Rectal cancer Neg Hx    Stomach cancer Neg Hx     Current Outpatient Medications  Medication Sig Dispense Refill   amLODipine-benazepril (LOTREL) 10-40 MG capsule TAKE 1 CAPSULE BY MOUTH  DAILY 90 capsule 3   aspirin EC 81 MG tablet Take 81 mg by mouth daily. Swallow whole.     clopidogrel (PLAVIX) 75 MG tablet Take 75 mg by mouth  daily.     ferrous sulfate 325 (65 FE) MG tablet Take 1 tablet (325 mg total) by mouth daily with breakfast. 90 tablet 0   hydrochlorothiazide (MICROZIDE) 12.5 MG capsule TAKE 1 CAPSULE BY MOUTH  DAILY (Patient taking differently: Take 12.5 mg by mouth daily.) 90 capsule 3   meloxicam (MOBIC) 15 MG tablet TAKE 1 TABLET BY MOUTH DAILY AS NEEDED. 90 tablet 1   oxyCODONE-acetaminophen (PERCOCET/ROXICET) 5-325 MG tablet Take 1-2 tablets by mouth every 4 (four) hours as needed for moderate pain. 30 tablet 0   rosuvastatin (CRESTOR) 40 MG tablet Take 1 tablet (40 mg total) by mouth daily. 90 tablet 3   collagenase (SANTYL) ointment Apply  1 application topically daily as needed (toe wound). (Patient not taking: Reported on 06/04/2021)     povidone-iodine (BETADINE) 10 % ointment Apply 1 application topically as needed for wound care (toe wound). (Patient not taking: Reported on 06/04/2021)     No current facility-administered medications for this visit.    No Known Allergies   REVIEW OF SYSTEMS:   [X]  denotes positive finding, [ ]  denotes negative finding Cardiac  Comments:  Chest pain or chest pressure:    Shortness of breath upon exertion:    Short of breath when lying flat:    Irregular heart rhythm:        Vascular    Pain in calf, thigh, or hip brought on by ambulation:    Pain in feet at night that wakes you up from your sleep:     Blood clot in your veins:    Leg swelling:  X Right foot and distal leg      Pulmonary    Oxygen at home:    Productive cough:     Wheezing:         Neurologic    Sudden weakness in arms or legs:     Sudden numbness in arms or legs:     Sudden onset of difficulty speaking or slurred speech:    Temporary loss of vision in one eye:     Problems with dizziness:         Gastrointestinal    Blood in stool:     Vomited blood:         Genitourinary    Burning when urinating:     Blood in urine:        Psychiatric    Major depression:          Hematologic    Bleeding problems:    Problems with blood clotting too easily:        Skin    Rashes or ulcers:        Constitutional    Fever or chills:      PHYSICAL EXAMINATION:  Vitals:   06/04/21 1339  BP: 115/65  Pulse: 88  Resp: 16  Temp: 98 F (36.7 C)  SpO2: 100%  Weight: 158 lb (71.7 kg)  Height: 5\' 10"  (1.778 m)    General:  WDWN in NAD; vital signs documented above Gait: Normal HENT: WNL, normocephalic Pulmonary: normal non-labored breathing , without  wheezing Cardiac: regular HR, without  Murmurs Vascular Exam/Pulses:2+ femoral pulses bilaterally, no palpable distal pulses. Brisk doppler Dp/ PT/ Pero bilaterally. Feet warm and well perfused. Right groin and thigh incisions intact and well appearing, dry eschar along right above and below knee incisions with superficial dehiscence of the distal most incision. No signs of infection. Cleaned with saline and wet to dry dressings applied.      Extremities: without ischemic changes, without Gangrene , without cellulitis; with open wounds on distal right leg incision and right great toe nail bed ulcer Musculoskeletal: no muscle wasting or atrophy  Neurologic: A&O X 3;  No focal weakness or paresthesias are detected Psychiatric:  The pt has Normal affect.  ASSESSMENT/PLAN:: 70 y.o. male here for follow up of nonhealing right great toe wound. He recently underwent right common femoral to peroneal artery composite bypass with PTFE and right GSV on 04/13/21 by Dr. Trula Slade. Superficial dehiscence of the right distal bypass incision, clean well appearing wound. No signs of infection. Ulcer of right great toe nail bed. Slow to heal. Right  leg is well perfused with brisk doppler DP/PT/ pero signals. No rest pain or new ulcers.  -recommend continued elevation of right leg to help edema - continue Aspirin, statin, plavix - Continue local wound care and observe for signs of infection -He will return in 3-4 weeks for another  wound check and he knows to call for earlier follow up if he has any concerns about appearance of his wounds   Karoline Caldwell, PA-C [facility] [phone]  Clinic MD:   Dr. Trula Slade

## 2021-06-17 ENCOUNTER — Other Ambulatory Visit: Payer: Self-pay | Admitting: Internal Medicine

## 2021-06-18 NOTE — Telephone Encounter (Signed)
Please refill as per office routine med refill policy (all routine meds to be refilled for 3 mo or monthly (per pt preference) up to one year from last visit, then month to month grace period for 3 mo, then further med refills will have to be denied) ? ?

## 2021-06-22 ENCOUNTER — Other Ambulatory Visit: Payer: Self-pay

## 2021-06-22 DIAGNOSIS — I739 Peripheral vascular disease, unspecified: Secondary | ICD-10-CM

## 2021-06-24 ENCOUNTER — Other Ambulatory Visit: Payer: Self-pay | Admitting: Surgery

## 2021-07-02 ENCOUNTER — Ambulatory Visit (HOSPITAL_COMMUNITY)
Admission: RE | Admit: 2021-07-02 | Discharge: 2021-07-02 | Disposition: A | Discharge status: Home / Self Care | Payer: PPO | Source: Ambulatory Visit | Attending: Surgery | Admitting: Surgery

## 2021-07-02 ENCOUNTER — Ambulatory Visit (INDEPENDENT_AMBULATORY_CARE_PROVIDER_SITE_OTHER)
Admission: RE | Admit: 2021-07-02 | Discharge: 2021-07-02 | Disposition: A | Discharge status: Home / Self Care | Payer: PPO | Source: Ambulatory Visit | Attending: Surgery | Admitting: Surgery

## 2021-07-02 ENCOUNTER — Encounter: Payer: Self-pay | Admitting: Physician Assistant

## 2021-07-02 ENCOUNTER — Ambulatory Visit (INDEPENDENT_AMBULATORY_CARE_PROVIDER_SITE_OTHER): Payer: PPO | Admitting: Physician Assistant

## 2021-07-02 ENCOUNTER — Other Ambulatory Visit: Payer: Self-pay

## 2021-07-02 VITALS — BP 103/65 | HR 73 | Temp 98.2°F | Resp 20 | Ht 70.0 in | Wt 165.0 lb

## 2021-07-02 DIAGNOSIS — I739 Peripheral vascular disease, unspecified: Secondary | ICD-10-CM

## 2021-07-02 NOTE — Progress Notes (Signed)
POST OPERATIVE OFFICE NOTE    CC:  F/u for surgery  HPI:  This is a 70 y.o. male who is s/p right common femoral to peroneal artery composite bypass with PTFE and right GSV on 04/13/21 by Dr. Trula Slade.   He was seen on 9/12 at which time he had some dehiscence of right below knee incision with drainage as well as wound on the right great toe nail bed. His right leg was well perfused with brisk doppler signals and there were no concerns of infection. He did not have any claudication or rest pain.  He did have some post revascularization edema in the leg and was instructed to elevate.  Dr. Earleen Newport sees him for his right great toe wound.   He was seen again on 06/04/2021 and at that time, the right leg was well perfused with brisk doppler signals right DP/PT/peroneal.  He was not having any rest pain or new ulcers.  He was to continue local wound care and was scheduled to return today for wound check and vascular studies.   Pt returns today for follow up.  Pt states he has been doing well.  He continues to have some swelling in the right leg.  He states it improves when he has been off of it but then once he is up and about, it swells again.  He states the incision on the distal portion of the leg has healed.  He states the great toe is improving.  He denies any pain in his feet.    No Known Allergies  Current Outpatient Medications  Medication Sig Dispense Refill   amLODipine-benazepril (LOTREL) 10-40 MG capsule TAKE 1 CAPSULE BY MOUTH  DAILY 90 capsule 3   aspirin EC 81 MG tablet Take 81 mg by mouth daily. Swallow whole.     clopidogrel (PLAVIX) 75 MG tablet Take 75 mg by mouth daily.     collagenase (SANTYL) ointment Apply 1 application topically daily as needed (toe wound). (Patient not taking: Reported on 06/04/2021)     ferrous sulfate 325 (65 FE) MG tablet Take 1 tablet (325 mg total) by mouth daily with breakfast. 90 tablet 0   hydrochlorothiazide (MICROZIDE) 12.5 MG capsule TAKE 1 CAPSULE  BY MOUTH  DAILY 90 capsule 3   meloxicam (MOBIC) 15 MG tablet TAKE 1 TABLET BY MOUTH DAILY AS NEEDED. 90 tablet 1   oxyCODONE-acetaminophen (PERCOCET/ROXICET) 5-325 MG tablet Take 1-2 tablets by mouth every 4 (four) hours as needed for moderate pain. 30 tablet 0   povidone-iodine (BETADINE) 10 % ointment Apply 1 application topically as needed for wound care (toe wound). (Patient not taking: Reported on 06/04/2021)     rosuvastatin (CRESTOR) 10 MG tablet TAKE 1 TABLET BY MOUTH EVERY DAY 90 tablet 3   rosuvastatin (CRESTOR) 40 MG tablet Take 1 tablet (40 mg total) by mouth daily. 90 tablet 3   No current facility-administered medications for this visit.     ROS:  See HPI  Physical Exam:  Today's Vitals   07/02/21 1308  BP: 103/65  Pulse: 73  Resp: 20  Temp: 98.2 F (36.8 C)  TempSrc: Temporal  SpO2: 100%  Weight: 165 lb (74.8 kg)  Height: 5\' 10"  (1.778 m)  PainSc: 0-No pain   Body mass index is 23.68 kg/m.   Incision:  all incisions have healed nicely. Extremities:  palpable bilateral femoral pulses; brisk right biphasic DP/PT/peroneal doppler signals and brisk monophasic left PT/peroneal doppler signals present.    RLE arterial duplex  07/02/2021: Right Graft #1: femoral to peroneal  +------------------+--------+--------+---------+--------+                    PSV cm/sStenosisWaveform Comments  +------------------+--------+--------+---------+--------+  Inflow            109             biphasic           +------------------+--------+--------+---------+--------+  Prox Anastomosis  108             biphasic           +------------------+--------+--------+---------+--------+  Proximal Graft    65              biphasic           +------------------+--------+--------+---------+--------+  Mid Graft         71              biphasic           +------------------+--------+--------+---------+--------+  Distal Graft      55              biphasic            +------------------+--------+--------+---------+--------+  Distal Anastomosis65              biphasic           +------------------+--------+--------+---------+--------+  Outflow           182             triphasic          +------------------+--------+--------+---------+--------+   Summary:  Right: Widely patent femoral to peroneal bypass with no evidence of  stenosis.  Elevated velocity in the outflow artery appears to be due to change in  vessel diameter; disease not observed.  ABI 07/02/2021: +-------+-----------+-----------+------------+------------+  ABI/TBIToday's ABIToday's TBIPrevious ABIPrevious TBI  +-------+-----------+-----------+------------+------------+  Right  1.11       bandage    0.48        0.00          +-------+-----------+-----------+------------+------------+  Left   1.02       absent     0.95        0.31          +-------+-----------+-----------+------------+------------+    Assessment/Plan:  This is a 70 y.o. male who is s/p: right common femoral to peroneal artery composite bypass with PTFE and right GSV on 04/13/21 by Dr. Trula Slade.   -pt doing well with patent bypass graft RLE and ABI much improved on the right .  His right great toe wound is improved from previous visit compared to the picture.  Encouraged him to f/u with Dr. Earleen Newport. -will have him return in 3 months for repeat studies.  He knows to call if he has any issues before then. -continue asa/statin   Leontine Locket, [facility] [place] [facility] MD:  Trula Slade

## 2021-07-04 ENCOUNTER — Other Ambulatory Visit: Payer: Self-pay

## 2021-07-04 DIAGNOSIS — I739 Peripheral vascular disease, unspecified: Secondary | ICD-10-CM

## 2021-07-19 ENCOUNTER — Ambulatory Visit: Payer: PPO | Admitting: Podiatry

## 2021-07-19 ENCOUNTER — Other Ambulatory Visit: Payer: Self-pay

## 2021-07-19 DIAGNOSIS — L97522 Non-pressure chronic ulcer of other part of left foot with fat layer exposed: Secondary | ICD-10-CM

## 2021-07-19 DIAGNOSIS — L03031 Cellulitis of right toe: Secondary | ICD-10-CM | POA: Diagnosis not present

## 2021-07-19 DIAGNOSIS — I739 Peripheral vascular disease, unspecified: Secondary | ICD-10-CM

## 2021-07-22 NOTE — Progress Notes (Signed)
Subjective: 70 year old male presents the office today for follow-up evaluation of a wound on the right big toe.  Since I last saw him he underwent bypass the right side.  He did follow-up with vascular surgery.  He presents today for follow-up evaluation of the wound.  States the wound is doing much better.  Not seeing any drainage or pus or any swelling or redness.  He is still out of work and not plan on returning back to work until after the first of the year.  Denies any fevers or chills.  No other concerns.    Objective: AAO x3, NAD On the right hallux new, pink skin is present on the area of the wound of the toenail starting to come back in.  There is no open sore identified today.  There is no drainage or pus.  No edema, erythema.  No ascending cellulitis.  No fluctuation or crepitation.  There is no malodor. No pain with calf compression, swelling, warmth, erythema  Assessment: Right hallux ulceration, significant improvement  Plan: -All treatment options discussed with the patient including all alternatives, risks, complications.  -Overall doing much better.  I will continue with offloading and daily foot inspection to make sure is no recurrence of the wound.  There is new, pink, healthy skin present only to make sure that this is not going to break that down.  Return in about 4 weeks (around 08/16/2021).  X-ray next appointment  Trula Slade DPM

## 2021-08-16 ENCOUNTER — Ambulatory Visit: Payer: PPO | Admitting: Podiatry

## 2021-08-19 IMAGING — CT CT ANGIO CHEST
2 of 6 series · 13 of 36 positions shown · IV contrast (iopamidol)
Comparison: Prior CT a chest 11/11/2018

CLINICAL DATA: Thoracic aortic aneurysm follow-up

EXAM:
CT ANGIOGRAPHY CHEST WITH CONTRAST
TECHNIQUE: Multidetector CT imaging of the chest was performed using the
standard protocol during bolus administration of intravenous
contrast. Multiplanar CT image reconstructions and MIPs were
obtained to evaluate the vascular anatomy.
CONTRAST:  75mL LL21WE-C8X IOPAMIDOL (LL21WE-C8X) INJECTION 76%

[Series 5: cta thorax 2.00 bv36 s3 axial arterial · axial · arterial · 0.59mm/px · z∈[+1599,+1861]mm · 12 of 155 slices shown]
[im 12/155  lung]
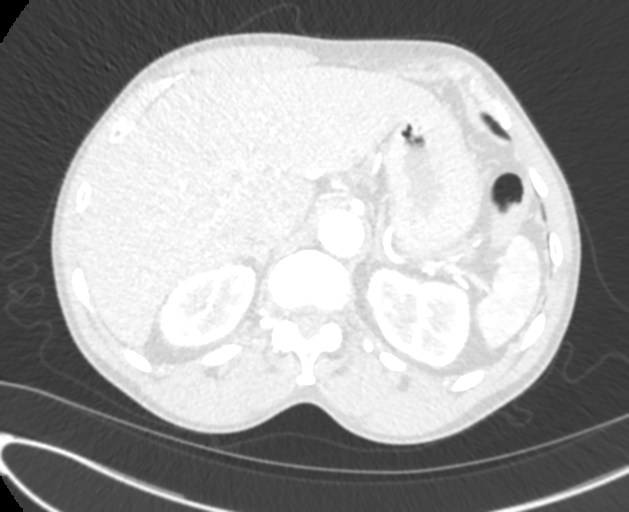
[im 24/155  mediastinal]
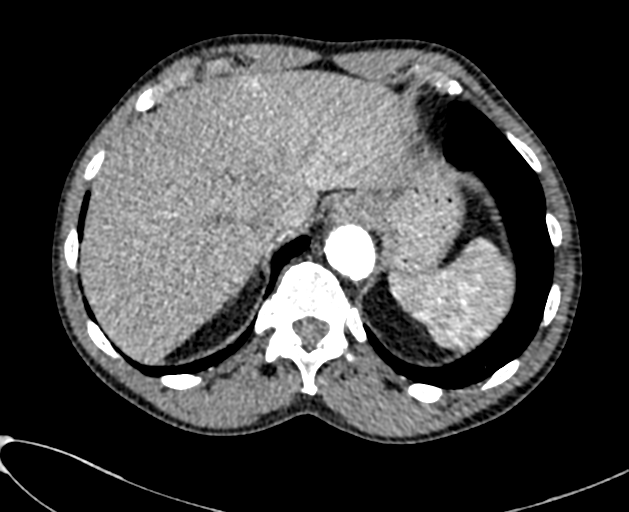
[im 36/155  lung]
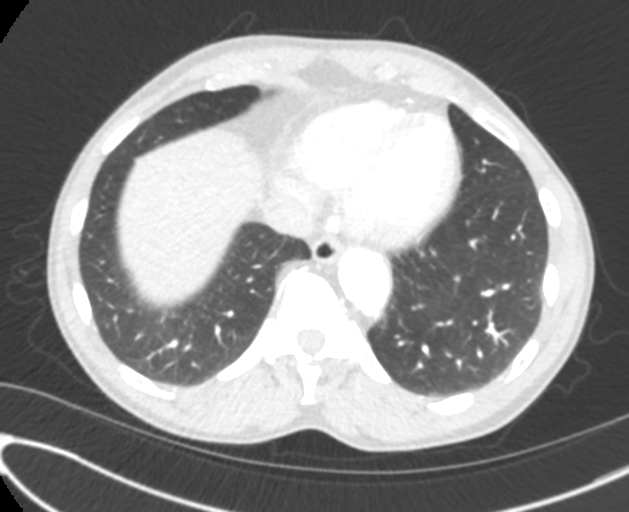
[im 48/155  mediastinal]
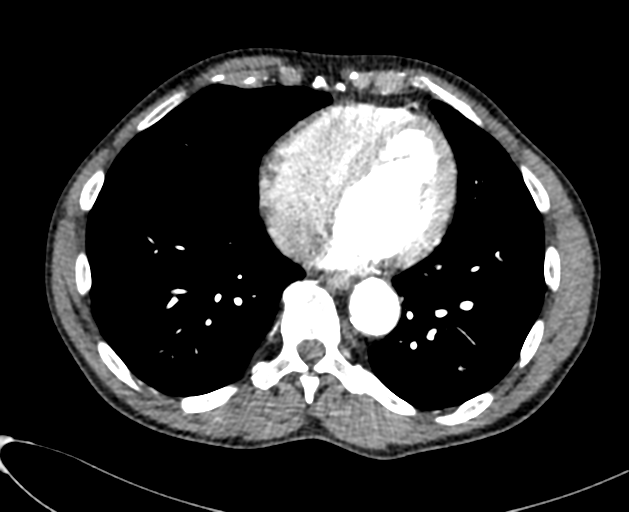
[im 60/155  lung]
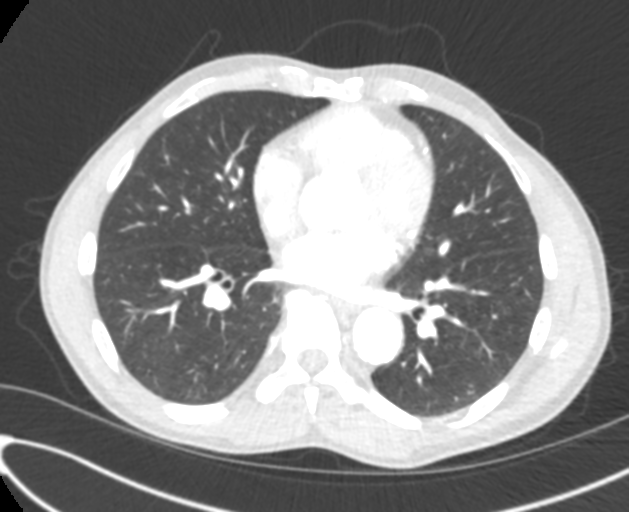
[im 72/155  mediastinal]
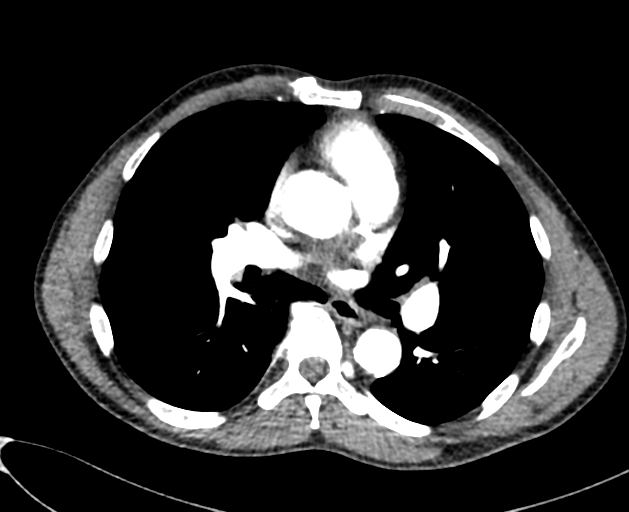
[im 83/155  lung]
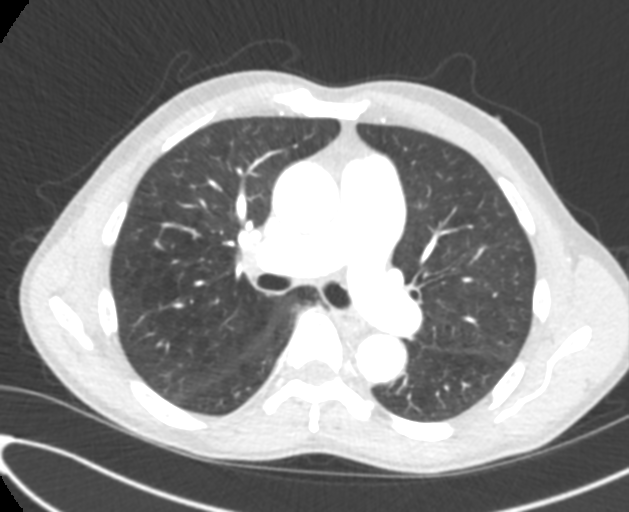
[im 95/155  mediastinal]
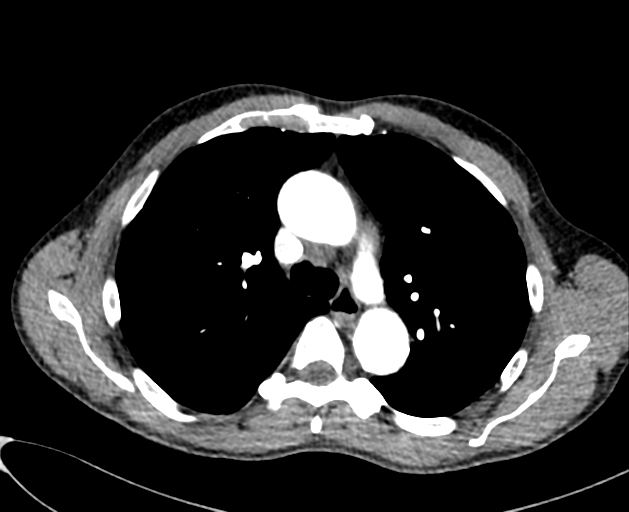
[im 107/155  lung]
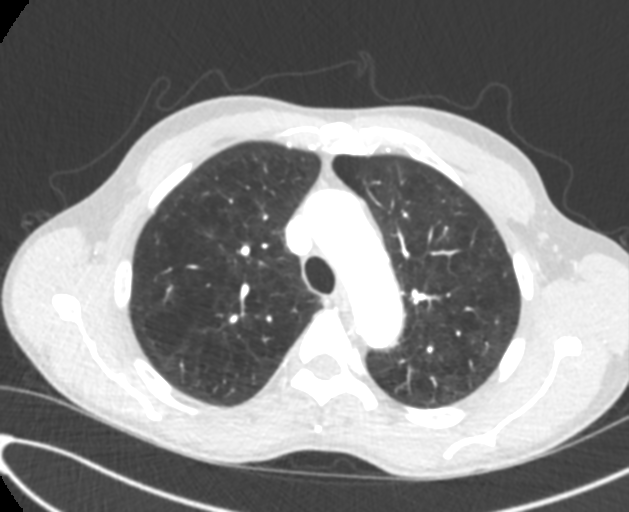
[im 119/155  mediastinal]
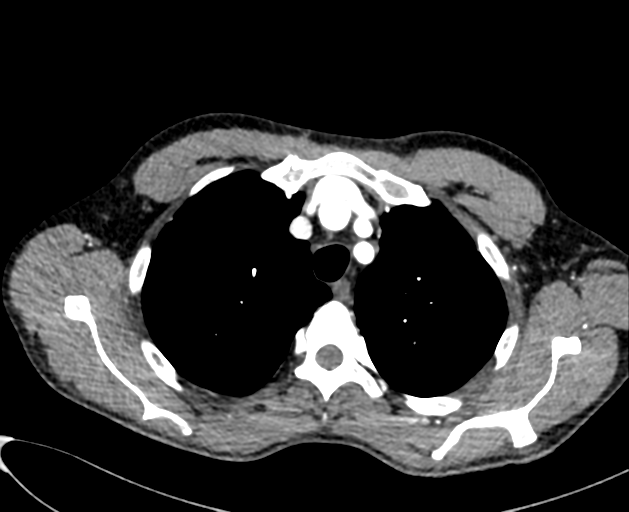
[im 131/155  lung]
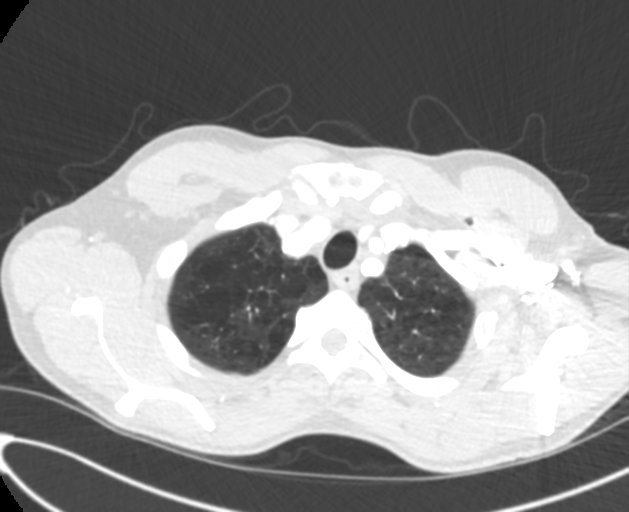
[im 143/155  mediastinal]
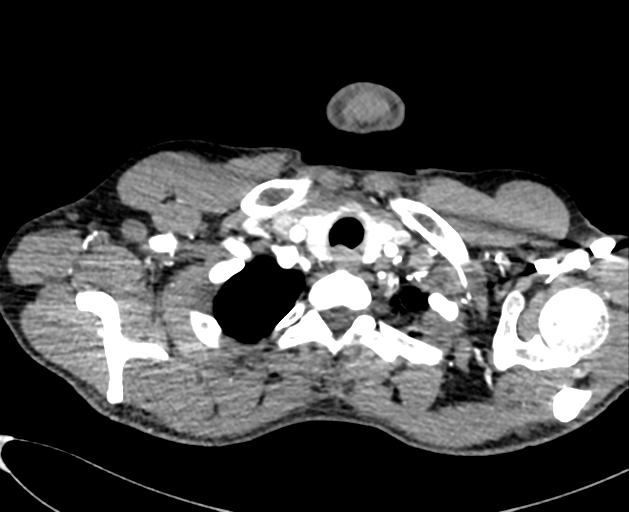

[Series 10: cta thorax 2.00 bv36 s3 cor st · coronal · 0.61mm/px · 1 of 150 slices shown]
[im 75/150  mediastinal]
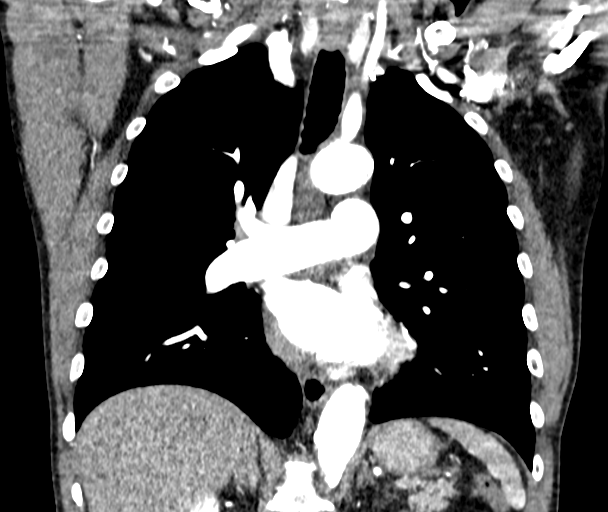

[13 of 36 positions shown; findings below may reference images not displayed]

FINDINGS: Cardiovascular: The main pulmonary artery is enlarged at 3.8 cm.
Thickened aortic valve. The aortic root remains normal in caliber at
3.9 cm. No effacement of the Iss[place]u junction. Ectasia of the
ascending thoracic aorta with a maximal transverse diameter of
cm. Focal penetrating atherosclerotic ulceration arising from the
descending thoracic aorta. Two vessel aortic arch. Right
brachiocephalic and left common carotid arteries share a common
origin. Scattered atherosclerotic vascular calcifications.
Calcifications throughout the coronary arteries. The heart is normal
in size.

Mediastinum/Nodes: No enlarged mediastinal, hilar, or axillary lymph
nodes. Thyroid gland, trachea, and esophagus demonstrate no
significant findings.

Lungs/Pleura: Advanced centrilobular pulmonary emphysema. Small
pulmonary nodules in the right upper and right lower lobe
demonstrate no interval change compared to prior imaging from 4040.
Two year stability is consistent with benignity. No new nodules or
suspicious findings.

Upper Abdomen: No acute abnormality.

Musculoskeletal: No acute fracture or aggressive appearing lytic or
blastic osseous lesion.

Review of the MIP images confirms the above findings.
IMPRESSION: 1. Ectatic but nonaneurysmal thoracic aorta. Slightly larger
measurement of the aortic diameter on prior imaging may have been
exaggerated due to cardiac motion artifact.
2. Thickened and likely bicuspid aortic valve.
3. Enlarged main pulmonary artery at 3.8 cm in diameter. Similar
findings can be seen in the setting of pulmonary arterial
hypertension.
4. Focal penetrating atherosclerotic ulceration arising from the
left posterolateral wall of the descending thoracic aorta.
5. Coronary artery calcifications.
6. Advanced centrilobular pulmonary emphysema.
7. Stable benign pulmonary nodules.

Aortic Atherosclerosis (ZFY8Z-S65.5) and Emphysema (ZFY8Z-2DQ.W).

## 2021-08-23 ENCOUNTER — Ambulatory Visit: Payer: PPO | Admitting: Podiatry

## 2021-08-23 ENCOUNTER — Ambulatory Visit (INDEPENDENT_AMBULATORY_CARE_PROVIDER_SITE_OTHER): Payer: PPO

## 2021-08-23 ENCOUNTER — Other Ambulatory Visit: Payer: Self-pay

## 2021-08-23 DIAGNOSIS — L97511 Non-pressure chronic ulcer of other part of right foot limited to breakdown of skin: Secondary | ICD-10-CM | POA: Diagnosis not present

## 2021-08-23 DIAGNOSIS — L600 Ingrowing nail: Secondary | ICD-10-CM

## 2021-08-23 MED ORDER — CEPHALEXIN 500 MG PO CAPS: 500.0000 mg | ORAL_CAPSULE | Freq: Three times a day (TID) | ORAL | 0 refills | Status: DC

## 2021-08-29 NOTE — Progress Notes (Signed)
Subjective: 70 year old male presents the office today for follow-up evaluation of a wound on the right big toe.  He states the wound is doing much better since I last saw him.  Denies any skin breakdown, swelling or warmth or any drainage on the right side.  The left toenail has been tender but denies any drainage or any open sores on that side.  Denies any fevers or chills.  He has no other concerns.    Objective: AAO x3, NAD On the right hallux nail bed the wound appears to be healed there is no skin breakdown.  There is no drainage or pus or any signs of infection.  Incurvation is present on the medial aspect of the left hallux toenail and upon debridement some superficial purulence was noted but there is no ulceration underneath.  There is no ascending cellulitis.  No fluctuance or crepitation.  No malodor. No pain with calf compression, swelling, warmth, erythema  Assessment: Right hallux ulceration, significant improvement  Plan: -All treatment options discussed with the patient including all alternatives, risks, complications.  -X-rays obtained reviewed of the right foot.  No definitive evidence of acute osteomyelitis. -Right foot toe wound is doing much better and appears to be healed.  Continue offloading. -Sharply debrided the left hallux nail without any complications to reveal the underlying purulence.  After debridement there was no further purulence.  Cleansed the area.  Recommended antibiotic ointment dressing changes daily.  Prescribed cephalexin.  If needed will need to have the nail removed however given his circulation and his history of delayed healing we will try to hold off on that if possible.  Monitor closely for any signs or symptoms of worsening infection including immediately should any occur.  Return in about 2 weeks (around 09/06/2021).  Trula Slade DPM

## 2021-09-07 ENCOUNTER — Ambulatory Visit: Payer: PPO | Admitting: Podiatry

## 2021-09-07 ENCOUNTER — Other Ambulatory Visit: Payer: Self-pay | Admitting: Internal Medicine

## 2021-09-07 ENCOUNTER — Ambulatory Visit (INDEPENDENT_AMBULATORY_CARE_PROVIDER_SITE_OTHER): Payer: PPO

## 2021-09-07 ENCOUNTER — Encounter: Payer: Self-pay | Admitting: Internal Medicine

## 2021-09-07 ENCOUNTER — Other Ambulatory Visit: Payer: Self-pay

## 2021-09-07 ENCOUNTER — Ambulatory Visit (INDEPENDENT_AMBULATORY_CARE_PROVIDER_SITE_OTHER): Payer: PPO | Admitting: Internal Medicine

## 2021-09-07 VITALS — BP 114/60 | HR 86 | Temp 98.7°F | Ht 70.0 in | Wt 163.4 lb

## 2021-09-07 DIAGNOSIS — E7800 Pure hypercholesterolemia, unspecified: Secondary | ICD-10-CM

## 2021-09-07 DIAGNOSIS — R051 Acute cough: Secondary | ICD-10-CM | POA: Diagnosis not present

## 2021-09-07 DIAGNOSIS — L84 Corns and callosities: Secondary | ICD-10-CM

## 2021-09-07 DIAGNOSIS — M19011 Primary osteoarthritis, right shoulder: Secondary | ICD-10-CM | POA: Diagnosis not present

## 2021-09-07 DIAGNOSIS — R739 Hyperglycemia, unspecified: Secondary | ICD-10-CM | POA: Diagnosis not present

## 2021-09-07 DIAGNOSIS — L600 Ingrowing nail: Secondary | ICD-10-CM

## 2021-09-07 DIAGNOSIS — E559 Vitamin D deficiency, unspecified: Secondary | ICD-10-CM

## 2021-09-07 DIAGNOSIS — M25511 Pain in right shoulder: Secondary | ICD-10-CM

## 2021-09-07 DIAGNOSIS — Z87891 Personal history of nicotine dependence: Secondary | ICD-10-CM | POA: Diagnosis not present

## 2021-09-07 DIAGNOSIS — E538 Deficiency of other specified B group vitamins: Secondary | ICD-10-CM | POA: Diagnosis not present

## 2021-09-07 DIAGNOSIS — I739 Peripheral vascular disease, unspecified: Secondary | ICD-10-CM | POA: Diagnosis not present

## 2021-09-07 DIAGNOSIS — M25512 Pain in left shoulder: Secondary | ICD-10-CM

## 2021-09-07 DIAGNOSIS — Z0000 Encounter for general adult medical examination without abnormal findings: Secondary | ICD-10-CM | POA: Diagnosis not present

## 2021-09-07 DIAGNOSIS — M19012 Primary osteoarthritis, left shoulder: Secondary | ICD-10-CM | POA: Diagnosis not present

## 2021-09-07 DIAGNOSIS — N179 Acute kidney failure, unspecified: Secondary | ICD-10-CM

## 2021-09-07 DIAGNOSIS — J439 Emphysema, unspecified: Secondary | ICD-10-CM | POA: Diagnosis not present

## 2021-09-07 DIAGNOSIS — F17200 Nicotine dependence, unspecified, uncomplicated: Secondary | ICD-10-CM

## 2021-09-07 DIAGNOSIS — R059 Cough, unspecified: Secondary | ICD-10-CM | POA: Insufficient documentation

## 2021-09-07 LAB — CBC WITH DIFFERENTIAL/PLATELET
Basophils Absolute: 0.1 10*3/uL (ref 0.0–0.1)
Basophils Relative: 1.4 % (ref 0.0–3.0)
Eosinophils Absolute: 0.2 10*3/uL (ref 0.0–0.7)
Eosinophils Relative: 5.5 % — ABNORMAL HIGH (ref 0.0–5.0)
HCT: 37 % — ABNORMAL LOW (ref 39.0–52.0)
Hemoglobin: 12 g/dL — ABNORMAL LOW (ref 13.0–17.0)
Lymphocytes Relative: 38.6 % (ref 12.0–46.0)
Lymphs Abs: 1.7 10*3/uL (ref 0.7–4.0)
MCHC: 32.6 g/dL (ref 30.0–36.0)
MCV: 90 fl (ref 78.0–100.0)
Monocytes Absolute: 0.5 10*3/uL (ref 0.1–1.0)
Monocytes Relative: 12 % (ref 3.0–12.0)
Neutro Abs: 1.9 10*3/uL (ref 1.4–7.7)
Neutrophils Relative %: 42.5 % — ABNORMAL LOW (ref 43.0–77.0)
Platelets: 208 10*3/uL (ref 150.0–400.0)
RBC: 4.1 Mil/uL — ABNORMAL LOW (ref 4.22–5.81)
RDW: 14.8 % (ref 11.5–15.5)
WBC: 4.4 10*3/uL (ref 4.0–10.5)

## 2021-09-07 LAB — URINALYSIS, ROUTINE W REFLEX MICROSCOPIC
Bilirubin Urine: NEGATIVE
Hgb urine dipstick: NEGATIVE
Ketones, ur: NEGATIVE
Leukocytes,Ua: NEGATIVE
Nitrite: NEGATIVE
RBC / HPF: NONE SEEN (ref 0–?)
Specific Gravity, Urine: >=1.03 — AB (ref 1.000–1.030)
Total Protein, Urine: NEGATIVE
Urine Glucose: NEGATIVE
Urobilinogen, UA: 0.2 (ref 0.0–1.0)
pH: 5.5 (ref 5.0–8.0)

## 2021-09-07 LAB — BASIC METABOLIC PANEL
BUN: 41 mg/dL — ABNORMAL HIGH (ref 6–23)
CO2: 26 mEq/L (ref 19–32)
Calcium: 8.8 mg/dL (ref 8.4–10.5)
Chloride: 105 mEq/L (ref 96–112)
Creatinine, Ser: 1.65 mg/dL — ABNORMAL HIGH (ref 0.40–1.50)
GFR: 42.42 mL/min — ABNORMAL LOW (ref >60.00)
Glucose, Bld: 97 mg/dL (ref 70–99)
Potassium: 4.8 mEq/L (ref 3.5–5.1)
Sodium: 138 mEq/L (ref 135–145)

## 2021-09-07 LAB — HEPATIC FUNCTION PANEL
ALT: 37 U/L (ref 0–53)
AST: 37 U/L (ref 0–37)
Albumin: 4.4 g/dL (ref 3.5–5.2)
Alkaline Phosphatase: 56 U/L (ref 39–117)
Bilirubin, Direct: 0.1 mg/dL (ref 0.0–0.3)
Total Bilirubin: 0.4 mg/dL (ref 0.2–1.2)
Total Protein: 7.2 g/dL (ref 6.0–8.3)

## 2021-09-07 LAB — LIPID PANEL
Cholesterol: 78 mg/dL (ref 0–200)
HDL: 28.6 mg/dL — ABNORMAL LOW (ref >39.00)
LDL Cholesterol: 28 mg/dL (ref 0–99)
NonHDL: 49.62
Total CHOL/HDL Ratio: 3
Triglycerides: 107 mg/dL (ref 0.0–149.0)
VLDL: 21.4 mg/dL (ref 0.0–40.0)

## 2021-09-07 LAB — VITAMIN D 25 HYDROXY (VIT D DEFICIENCY, FRACTURES): VITD: 11.07 ng/mL — ABNORMAL LOW (ref 30.00–100.00)

## 2021-09-07 LAB — TSH: TSH: 0.98 u[IU]/mL (ref 0.35–5.50)

## 2021-09-07 LAB — VITAMIN B12: Vitamin B-12: 436 pg/mL (ref 211–911)

## 2021-09-07 LAB — HEMOGLOBIN A1C: Hgb A1c MFr Bld: 5.6 % (ref 4.6–6.5)

## 2021-09-07 LAB — PSA: PSA: 0 ng/mL — ABNORMAL LOW (ref 0.10–4.00)

## 2021-09-07 MED ORDER — HYDROCODONE BIT-HOMATROP MBR 5-1.5 MG/5ML PO SOLN: 5.0000 mL | Freq: Four times a day (QID) | ORAL | 0 refills | Status: AC | PRN

## 2021-09-07 MED ORDER — AZITHROMYCIN 250 MG PO TABS: ORAL_TABLET | ORAL | 0 refills | Status: AC

## 2021-09-07 MED ORDER — MELOXICAM 15 MG PO TABS: 15.0000 mg | ORAL_TABLET | Freq: Every day | ORAL | 1 refills | Status: DC | PRN

## 2021-09-07 NOTE — Progress Notes (Signed)
Patient ID: BASIM BARTNIK, male   DOB: 04-Oct-1950, 72 y.o.   MRN: [id number]         Chief Complaint:: wellness exam and Follow-up  Cough, bilateral shoulder pain, hld       HPI:  JAZMIN VENSEL is a 71 y.o. male here for wellness exam; declines shingirx, tdap, and colonoscopy, o/w up to date                        Also Here with acute onset mild to mod 2-3 days ST, HA, general weakness and malaise, with prod cough greenish sputum, but Pt denies chest pain, increased sob or doe, wheezing, orthopnea, PND, increased LE swelling, palpitations, dizziness or syncope.  Also had right > left bilateral shoulder pain with abduction with worse pain and reduced ROM on the right, sometimes with grinding in the joints as well.  Has been taking the increased lipitor since last visit and tolerating well, trying to follow lower chol diet.   Pt denies polydipsia, polyuria, or new focal neuro s/s.   Pt denies wt loss, night sweats, loss of appetite, or other constitutional symptoms   Stopped smoking aug 2022 with strawberry nicorettes.  This is on top of vascular stenting, RLE vascular bypass, and prostatectomy in the past yr Wt Readings from Last 3 Encounters:  09/07/21 163 lb 6.4 oz (74.1 kg)  07/02/21 165 lb (74.8 kg)  06/04/21 158 lb (71.7 kg)   BP Readings from Last 3 Encounters:  09/07/21 114/60  07/02/21 103/65  06/04/21 115/65   Immunization History  Administered Date(s) Administered   Fluad Quad(high Dose 65+) 04/21/2019, 09/06/2020   Influenza Split 06/21/2011   Influenza, High Dose Seasonal PF 10/03/2015, 10/16/2018, 06/22/2021   Influenza, Seasonal, Injecte, Preservative Fre 09/25/2012   Influenza,inj,Quad PF,6+ Mos 09/28/2013, 09/29/2014   PFIZER(Purple Top)SARS-COV-2 Vaccination 11/13/2019, 12/08/2019, 07/28/2020   Pfizer Covid-19 Vaccine Bivalent Booster 53yrs & up 06/22/2021   Pneumococcal Conjugate-13 10/16/2018   Pneumococcal Polysaccharide-23 03/07/2021   Td 03/14/2010   Tdap  09/06/2020   There are no preventive care reminders to display for this patient.     Past Medical History:  Diagnosis Date   Anemia, iron deficiency 04/02/2016   Aortic atherosclerosis (HCC)    Arthritis    rt knee   BPH (benign prostatic hyperplasia) 04/10/2017   Cancer ([place])    prostate   COLONIC POLYPS, HX OF 11/24/2007   COPD (chronic obstructive pulmonary disease) (HCC)    pt denies this- no respiratory issues of any kind per pt   Coronary artery calcification seen on CAT scan    DIVERTICULOSIS, COLON 11/24/2007   ED (erectile dysfunction)    GERD (gastroesophageal reflux disease)    HLD (hyperlipidemia) 09/06/2020   HYPERTENSION 11/24/2007   Increased prostate specific antigen (PSA) velocity 06/22/2011   PAH (pulmonary artery hypertension) ([place])    Peripheral vascular disease ([place])    Past Surgical History:  Procedure Laterality Date   ABDOMINAL AORTOGRAM W/LOWER EXTREMITY N/A 07/04/2020   Procedure: ABDOMINAL AORTOGRAM W/LOWER EXTREMITY;  Surgeon: Serafina Mitchell, MD;  Location: [facility];  Service: Cardiovascular;  Laterality: N/A;   ABDOMINAL AORTOGRAM W/LOWER EXTREMITY N/A 07/18/2020   Procedure: ABDOMINAL AORTOGRAM W/LOWER EXTREMITY;  Surgeon: Serafina Mitchell, MD;  Location: [facility];  Service: Cardiovascular;  Laterality: N/A;   ABDOMINAL AORTOGRAM W/LOWER EXTREMITY N/A 04/04/2021   Procedure: ABDOMINAL AORTOGRAM W/LOWER EXTREMITY;  Surgeon: Cherre Robins, MD;  Location:  [facility];  Service: Cardiovascular;  Laterality: N/A;   BYPASS GRAFT FEMORAL-PERONEAL Right 04/13/2021   Procedure: RIGHT LEG FEMORAL-PERONEAL BYPASS USING PROPATEN GRAFT;  Surgeon: Serafina Mitchell, MD;  Location: [facility];  Service: Vascular;  Laterality: Right;   COLONOSCOPY     NO PAST SURGERIES     PATCH ANGIOPLASTY Right 04/13/2021   Procedure: Mike Gip USING VEIN PATCH ANGIOPLASTY;  Surgeon: Serafina Mitchell, MD;  Location: [facility];  Service: Vascular;  Laterality:  Right;   PERIPHERAL VASCULAR BALLOON ANGIOPLASTY Right 07/18/2020   Procedure: PERIPHERAL VASCULAR BALLOON ANGIOPLASTY;  Surgeon: Serafina Mitchell, MD;  Location: [facility];  Service: Cardiovascular;  Laterality: Right;  tibioperoneal trunk and posterior tibial   PERIPHERAL VASCULAR INTERVENTION  07/04/2020   Procedure: PERIPHERAL VASCULAR INTERVENTION;  Surgeon: Serafina Mitchell, MD;  Location: [facility];  Service: Cardiovascular;;  Lt. SFA and Popliteal   PERIPHERAL VASCULAR INTERVENTION Right 07/18/2020   Procedure: PERIPHERAL VASCULAR INTERVENTION;  Surgeon: Serafina Mitchell, MD;  Location: [facility];  Service: Cardiovascular;  Laterality: Right;  Femoral Popliteal   ROBOT ASSISTED LAPAROSCOPIC RADICAL PROSTATECTOMY N/A 01/08/2021   Procedure: XI ROBOTIC ASSISTED LAPAROSCOPIC RADICAL PROSTATECTOMY LEVEL 3;  Surgeon: Raynelle Bring, MD;  Location: [facility];  Service: Urology;  Laterality: N/A;    reports that he quit smoking about 4 months ago. His smoking use included cigarettes. He smoked an average of .25 packs per day. He has never used smokeless tobacco. He reports that he does not currently use alcohol. He reports that he does not use drugs. family history includes Anuerysm in his brother; Diabetes in his father and paternal grandfather; Hypertension in his sister; Kidney failure in his brother; Stroke in his mother. No Known Allergies Current Outpatient Medications on File Prior to Visit  Medication Sig Dispense Refill   amLODipine-benazepril (LOTREL) 10-40 MG capsule TAKE 1 CAPSULE BY MOUTH  DAILY 90 capsule 3   aspirin EC 81 MG tablet Take 81 mg by mouth daily. Swallow whole.     clopidogrel (PLAVIX) 75 MG tablet Take 75 mg by mouth daily.     collagenase (SANTYL) ointment Apply 1 application topically daily as needed (toe wound).     ferrous sulfate 325 (65 FE) MG tablet Take 1 tablet (325 mg total) by mouth daily with breakfast. 90 tablet 0    hydrochlorothiazide (MICROZIDE) 12.5 MG capsule TAKE 1 CAPSULE BY MOUTH  DAILY 90 capsule 3   povidone-iodine (BETADINE) 10 % ointment Apply 1 application topically as needed for wound care (toe wound).     rosuvastatin (CRESTOR) 10 MG tablet TAKE 1 TABLET BY MOUTH EVERY DAY 90 tablet 3   rosuvastatin (CRESTOR) 40 MG tablet Take 1 tablet (40 mg total) by mouth daily. 90 tablet 3   oxyCODONE-acetaminophen (PERCOCET/ROXICET) 5-325 MG tablet Take 1-2 tablets by mouth every 4 (four) hours as needed for moderate pain. (Patient not taking: Reported on 09/07/2021) 30 tablet 0   No current facility-administered medications on file prior to visit.        ROS:  All others reviewed and negative.  Objective        PE:  BP 114/60 (BP Location: Right Arm, Patient Position: Sitting, Cuff Size: Large)    Pulse 86    Temp 98.7 F (37.1 C) (Oral)    Ht 5\' 10"  (1.778 m)    Wt 163 lb 6.4 oz (74.1 kg)    SpO2 98%    BMI  23.45 kg/m                 Constitutional: Pt appears in NAD               HENT: Head: NCAT.                Right Ear: External ear normal.                 Left Ear: External ear normal.                Eyes: . Pupils are equal, round, and reactive to light. Conjunctivae and EOM are normal               Nose: without d/c or deformity               Neck: Neck supple. Gross normal ROM               Cardiovascular: Normal rate and regular rhythm.                 Pulmonary/Chest: Effort normal and breath sounds without rales or wheezing.                Abd:  Soft, NT, ND, + BS, no organomegaly               Neurological: Pt is alert. At baseline orientation, motor grossly intact               Skin: Skin is warm. No rashes, no other new lesions, LE edema - none               Psychiatric: Pt behavior is normal without agitation   Micro: none  Cardiac tracings I have personally interpreted today:  none  Pertinent Radiological findings (summarize): none   Lab Results  Component Value Date   WBC  4.4 09/07/2021   HGB 12.0 (L) 09/07/2021   HCT 37.0 (L) 09/07/2021   PLT 208.0 09/07/2021   GLUCOSE 97 09/07/2021   CHOL 78 09/07/2021   TRIG 107.0 09/07/2021   HDL 28.60 (L) 09/07/2021   LDLDIRECT 74.0 04/21/2019   LDLCALC 28 09/07/2021   ALT 37 09/07/2021   AST 37 09/07/2021   NA 138 09/07/2021   K 4.8 09/07/2021   CL 105 09/07/2021   CREATININE 1.65 (H) 09/07/2021   BUN 41 (H) 09/07/2021   CO2 26 09/07/2021   TSH 0.98 09/07/2021   PSA 0.00 (L) 09/07/2021   INR 1.0 04/13/2021   HGBA1C 5.6 09/07/2021   Assessment/Plan:  NATALIO SALOIS is a 71 y.o. Black or African American [2] male with  has a past medical history of Anemia, iron deficiency (04/02/2016), Aortic atherosclerosis ([place]), Arthritis, BPH (benign prostatic hyperplasia) (04/10/2017), Cancer ([place]), COLONIC POLYPS, HX OF (11/24/2007), COPD (chronic obstructive pulmonary disease) ([place]), Coronary artery calcification seen on CAT scan, DIVERTICULOSIS, COLON (11/24/2007), ED (erectile dysfunction), GERD (gastroesophageal reflux disease), HLD (hyperlipidemia) (09/06/2020), HYPERTENSION (11/24/2007), Increased prostate specific antigen (PSA) velocity (06/22/2011), PAH (pulmonary artery hypertension) ([place]), and Peripheral vascular disease ([place]).  Preventative health care Age and sex appropriate education and counseling updated with regular exercise and diet Referrals for preventative services - declines colonoscopy for now Immunizations addressed - declines shingrix and tdap Smoking counseling  - none needed - quit recently Evidence for depression or other mood disorder - none significant Most recent labs reviewed. I have personally reviewed and have noted: 1) the patient's medical and social history 2) The  patient's current medications and supplements 3) The patient's height, weight, and BMI have been recorded in the chart   HLD (hyperlipidemia) Lab Results  Component Value Date   LDLCALC 28 09/07/2021   Stable, pt to continue  current statin crestor   Hyperglycemia Lab Results  Component Value Date   HGBA1C 5.6 09/07/2021   Stable, pt to continue current medical treatment  - diet   Former smoker Urged pt to continue abstinence  Vitamin D deficiency Last vitamin D Lab Results  Component Value Date   VD25OH 11.07 (L) 09/07/2021   Low, to start oral replacement   Cough Mild to mod, c/w bronchitis vs pna , for cxr, antibx course, cough med prn  Acute pain of both shoulders Exam c/w probable rotater cuff tear/tendonitis, for xray, mobic prn, refer sport medicine  Followup: Return in about 6 months (around 03/07/2022).  Cathlean Cower, MD 09/09/2021 2:38 PM [place]

## 2021-09-07 NOTE — Patient Instructions (Signed)
Please remember to check your home COVID testing  Please take all new medication as prescribed - the antibiotic, and cough medicine, and the anti-inflammatory medication for the pain in the shoulders  Please continue all other medications as before, and refills have been done if requested.  Please have the pharmacy call with any other refills you may need.  Please continue your efforts at being more active, low cholesterol diet, and weight control.  You are otherwise up to date with prevention measures today.  Please keep your appointments with your specialists as you may have planned  You will be contacted regarding the referral for: Sports Medicine for the shoulder  Please go to the XRAY Department in the first floor for the x-ray testing  Please go to the LAB at the blood drawing area for the tests to be done  You will be contacted by phone if any changes need to be made immediately.  Otherwise, you will receive a letter about your results with an explanation, but please check with MyChart first.  Please remember to sign up for MyChart if you have not done so, as this will be important to you in the future with finding out test results, communicating by private email, and scheduling acute appointments online when needed.  Please make an Appointment to return in 6 months, or sooner if needed

## 2021-09-09 ENCOUNTER — Encounter: Payer: Self-pay | Admitting: Internal Medicine

## 2021-09-09 DIAGNOSIS — M25512 Pain in left shoulder: Secondary | ICD-10-CM | POA: Insufficient documentation

## 2021-09-09 DIAGNOSIS — M25511 Pain in right shoulder: Secondary | ICD-10-CM | POA: Insufficient documentation

## 2021-09-09 NOTE — Assessment & Plan Note (Signed)
Lab Results  Component Value Date   HGBA1C 5.6 09/07/2021   Stable, pt to continue current medical treatment  - diet

## 2021-09-09 NOTE — Assessment & Plan Note (Signed)
Lab Results  Component Value Date   LDLCALC 28 09/07/2021   Stable, pt to continue current statin crestor

## 2021-09-09 NOTE — Assessment & Plan Note (Signed)
Exam c/w probable rotater cuff tear/tendonitis, for xray, mobic prn, refer sport medicine

## 2021-09-09 NOTE — Assessment & Plan Note (Signed)
Urged pt to continue abstinence

## 2021-09-09 NOTE — Assessment & Plan Note (Signed)
Age and sex appropriate education and counseling updated with regular exercise and diet Referrals for preventative services - declines colonoscopy for now Immunizations addressed - declines shingrix and tdap Smoking counseling  - none needed - quit recently Evidence for depression or other mood disorder - none significant Most recent labs reviewed. I have personally reviewed and have noted: 1) the patient's medical and social history 2) The patient's current medications and supplements 3) The patient's height, weight, and BMI have been recorded in the chart

## 2021-09-09 NOTE — Assessment & Plan Note (Signed)
Last vitamin D Lab Results  Component Value Date   VD25OH 11.07 (L) 09/07/2021   Low, to start oral replacement

## 2021-09-09 NOTE — Assessment & Plan Note (Signed)
Mild to mod, c/w bronchitis vs pna , for cxr, antibx course, cough med prn

## 2021-09-10 NOTE — Progress Notes (Signed)
Please print and send letter from [facility]

## 2021-09-10 NOTE — Progress Notes (Signed)
Please print and send letter from [facility]

## 2021-09-11 NOTE — Progress Notes (Signed)
Subjective: 71 year old male presents the office today for follow-up evaluation of a wound on the right big toe as well as ingrown toenail left big toe.  He states that he is doing much better and he has not seen any swelling, redness and he has no pain.  Both sides are doing much better.  Denies any fevers or chills.  No other concerns today.  Objective: AAO x3, NAD On the right hallux nail bed the wound appears to be healed there is no skin breakdown.  There is no edema, erythema, drainage or pus or any signs of infection. On the left hallux toenail the area the previous ingrown toenail appears to be much better as well.  There is no pain and there is no swelling redness or any drainage.  I was able to debride some of the nail today and again no signs of infection. No pain with calf compression, swelling, warmth, erythema  Assessment: Right hallux ulceration, left hallux ingrown toenail; significant improvement  Plan: -All treatment options discussed with the patient including all alternatives, risks, complications.  -Both sides are doing much better there is no open lesions identified today.  Continue offloading.  I did sharply debride the left hallux nail any complications or bleeding to make sure no signs of infection which there was not.  Monitor for any reoccurrence.  Discussed the importance that if obstruction.  Follow-up with vascular surgery as scheduled.  Trula Slade DPM

## 2021-09-12 NOTE — Progress Notes (Signed)
[name] D.[place] Phone: [phone]   Assessment and Plan:     1. Primary osteoarthritis of right shoulder 2. Chronic right shoulder pain -Chronic with exacerbation, initial sports medicine visit - Likely acute flare of osteoarthritis causing impingement syndrome versus subacromial bursitis - Patient elected for CSI.  Tolerated well per note below - Tylenol as needed for pain control - Start HEP  Procedure: Subacromial Injection Side: Right  Risks explained and consent was given verbally. The site was cleaned with alcohol prep. A steroid injection was performed from posterior approach using 29mL of 1% lidocaine without epinephrine and 60mL of kenalog 40mg /ml. This was well tolerated and resulted in symptomatic relief.  Needle was removed, hemostasis achieved, and post injection instructions were explained.   Pt was advised to call or return to clinic if these symptoms worsen or fail to improve as anticipated.   3. Primary osteoarthritis of left shoulder 4. Chronic left shoulder pain -Chronic and stable, initial sports medicine visit - Mild left shoulder pain intermittently over the last several months - Pain is not significant at this time, so patient declines CSI - Tylenol as needed for pain control - Start HEP   Pertinent previous records reviewed include x-ray left shoulder 09/07/2021, x-ray right shoulder 09/07/2021, PCP note 09/07/2021   Follow Up: 3 weeks for reevaluation.  Could consider formal PT versus NSAID course versus versus ultrasound if no improvement or worsening of symptoms   Subjective:   I, Joshua Curtis, am serving as a Education administrator for Doctor Glennon Mac  Chief Complaint: acute bilateral shoulder pain   HPI:   09/13/2021 Patient is 71 year old male complaining of bilateral shoulder pain. Patient states when he raises his arm , was rx pain med from doctor Jenny Reichmann the pain has gotten  better ,been going for a month maybe two,no radiating pain, no numbness, used to be a side sleeper,only hurts when he does fast movements right hurts more than his left , pain is deltoid pain right on top   Relevant Historical Information: Hypertension, PVD, history of prostate cancer  Additional pertinent review of systems negative.   Current Outpatient Medications:    amLODipine-benazepril (LOTREL) 10-40 MG capsule, TAKE 1 CAPSULE BY MOUTH  DAILY, Disp: 90 capsule, Rfl: 3   aspirin EC 81 MG tablet, Take 81 mg by mouth daily. Swallow whole., Disp: , Rfl:    clopidogrel (PLAVIX) 75 MG tablet, Take 75 mg by mouth daily., Disp: , Rfl:    collagenase (SANTYL) ointment, Apply 1 application topically daily as needed (toe wound)., Disp: , Rfl:    ferrous sulfate 325 (65 FE) MG tablet, Take 1 tablet (325 mg total) by mouth daily with breakfast., Disp: 90 tablet, Rfl: 0   hydrochlorothiazide (MICROZIDE) 12.5 MG capsule, TAKE 1 CAPSULE BY MOUTH  DAILY, Disp: 90 capsule, Rfl: 3   HYDROcodone bit-homatropine (HYCODAN) 5-1.5 MG/5ML syrup, Take 5 mLs by mouth every 6 (six) hours as needed for up to 10 days., Disp: 180 mL, Rfl: 0   meloxicam (MOBIC) 15 MG tablet, Take 1 tablet (15 mg total) by mouth daily as needed., Disp: 90 tablet, Rfl: 1   oxyCODONE-acetaminophen (PERCOCET/ROXICET) 5-325 MG tablet, Take 1-2 tablets by mouth every 4 (four) hours as needed for moderate pain., Disp: 30 tablet, Rfl: 0   povidone-iodine (BETADINE) 10 % ointment, Apply 1 application topically as needed for wound care (toe wound)., Disp: , Rfl:  rosuvastatin (CRESTOR) 10 MG tablet, TAKE 1 TABLET BY MOUTH EVERY DAY, Disp: 90 tablet, Rfl: 3   rosuvastatin (CRESTOR) 40 MG tablet, Take 1 tablet (40 mg total) by mouth daily., Disp: 90 tablet, Rfl: 3   Objective:     Vitals:   09/13/21 1436  BP: 122/80  Pulse: 79  SpO2: 98%  Weight: 163 lb (73.9 kg)  Height: 5\' 10"  (1.778 m)      Body mass index is 23.39 kg/m.     Physical Exam:    Gen: Appears well, nad, nontoxic and pleasant Neuro:sensation intact, strength is 5/5 with df/pf/inv/ev, muscle tone wnl Skin: no suspicious lesion or defmority Psych: A&O, appropriate mood and affect  Right shoulder: no deformity, swelling or muscle wasting No scapular winging FF 170, abd 120, int 20, ext 75 NTTP over the [place], clavicle, ac, coracoid, biceps groove, humerus, deltoid, trapezius, cervical spine Positive neer, hawkings, empty can, subscap liftoff, speeds, obriens, crossarm Neg ant drawer, sulcus sign, apprehension Negative Spurling's test bilat FROM of neck    Electronically signed by:  [name] D.[facility] 3:10 PM 09/13/21

## 2021-09-13 ENCOUNTER — Other Ambulatory Visit: Payer: Self-pay

## 2021-09-13 ENCOUNTER — Ambulatory Visit (INDEPENDENT_AMBULATORY_CARE_PROVIDER_SITE_OTHER): Payer: PPO | Admitting: Sports Medicine

## 2021-09-13 VITALS — BP 122/80 | HR 79 | Ht 70.0 in | Wt 163.0 lb

## 2021-09-13 DIAGNOSIS — M25512 Pain in left shoulder: Secondary | ICD-10-CM

## 2021-09-13 DIAGNOSIS — M25511 Pain in right shoulder: Secondary | ICD-10-CM | POA: Diagnosis not present

## 2021-09-13 DIAGNOSIS — M19012 Primary osteoarthritis, left shoulder: Secondary | ICD-10-CM | POA: Diagnosis not present

## 2021-09-13 DIAGNOSIS — G8929 Other chronic pain: Secondary | ICD-10-CM

## 2021-09-13 DIAGNOSIS — M19011 Primary osteoarthritis, right shoulder: Secondary | ICD-10-CM | POA: Diagnosis not present

## 2021-09-13 NOTE — Patient Instructions (Addendum)
Good to see you Shoulder HEP Tylenol 500mg  1/2 times a day for pain  3 week follow up

## 2021-10-01 ENCOUNTER — Other Ambulatory Visit: Payer: Self-pay

## 2021-10-01 ENCOUNTER — Ambulatory Visit: Payer: PPO | Admitting: Physician Assistant

## 2021-10-01 ENCOUNTER — Ambulatory Visit (INDEPENDENT_AMBULATORY_CARE_PROVIDER_SITE_OTHER)
Admission: RE | Admit: 2021-10-01 | Discharge: 2021-10-01 | Disposition: A | Discharge status: Home / Self Care | Payer: PPO | Source: Ambulatory Visit | Attending: Physician Assistant | Admitting: Physician Assistant

## 2021-10-01 ENCOUNTER — Ambulatory Visit (HOSPITAL_COMMUNITY)
Admission: RE | Admit: 2021-10-01 | Discharge: 2021-10-01 | Disposition: A | Discharge status: Home / Self Care | Payer: PPO | Source: Ambulatory Visit | Attending: Surgery | Admitting: Surgery

## 2021-10-01 VITALS — BP 114/65 | HR 70 | Temp 97.8°F | Resp 18 | Ht 70.0 in | Wt 165.0 lb

## 2021-10-01 DIAGNOSIS — I739 Peripheral vascular disease, unspecified: Secondary | ICD-10-CM

## 2021-10-01 DIAGNOSIS — I70235 Atherosclerosis of native arteries of right leg with ulceration of other part of foot: Secondary | ICD-10-CM

## 2021-10-01 MED ORDER — ROSUVASTATIN CALCIUM 40 MG PO TABS: 40.0000 mg | ORAL_TABLET | Freq: Every day | ORAL | 3 refills | Status: DC

## 2021-10-01 NOTE — Progress Notes (Signed)
Office Note     CC:  follow up Requesting Provider:  Biagio Borg, MD  HPI: Joshua Curtis is a 71 y.o. (1951-04-08) male who presents to go over vascular studies related to PAD.  He is status post right iliofemoral endarterectomy and vein patch angioplasty with femoral to peroneal artery composite bypass with PTFE and vein by Dr. Trula Slade on 04/13/2021 due to right great toe ulceration.  He also has history of left SFA and popliteal stent by Dr. Trula Slade on 07/04/2020.  He denies any claudication or rest pain of bilateral lower extremities.  Right great toe ulcer has healed.  This is followed by podiatrist Dr. Jacqualyn Posey.  He is on aspirin and statin daily.  He denies tobacco use.   Past Medical History:  Diagnosis Date   Anemia, iron deficiency 04/02/2016   Aortic atherosclerosis (HCC)    Arthritis    rt knee   BPH (benign prostatic hyperplasia) 04/10/2017   Cancer ([place])    prostate   COLONIC POLYPS, HX OF 11/24/2007   COPD (chronic obstructive pulmonary disease) (HCC)    pt denies this- no respiratory issues of any kind per pt   Coronary artery calcification seen on CAT scan    DIVERTICULOSIS, COLON 11/24/2007   ED (erectile dysfunction)    GERD (gastroesophageal reflux disease)    HLD (hyperlipidemia) 09/06/2020   HYPERTENSION 11/24/2007   Increased prostate specific antigen (PSA) velocity 06/22/2011   PAH (pulmonary artery hypertension) ([place])    Peripheral vascular disease ([place])     Past Surgical History:  Procedure Laterality Date   ABDOMINAL AORTOGRAM W/LOWER EXTREMITY N/A 07/04/2020   Procedure: ABDOMINAL AORTOGRAM W/LOWER EXTREMITY;  Surgeon: Serafina Mitchell, MD;  Location: [place] [facility];  Service: Cardiovascular;  Laterality: N/A;   ABDOMINAL AORTOGRAM W/LOWER EXTREMITY N/A 07/18/2020   Procedure: ABDOMINAL AORTOGRAM W/LOWER EXTREMITY;  Surgeon: Serafina Mitchell, MD;  Location: [place] [facility];  Service: Cardiovascular;  Laterality: N/A;   ABDOMINAL AORTOGRAM W/LOWER  EXTREMITY N/A 04/04/2021   Procedure: ABDOMINAL AORTOGRAM W/LOWER EXTREMITY;  Surgeon: Cherre Robins, MD;  Location: [place] [facility];  Service: Cardiovascular;  Laterality: N/A;   BYPASS GRAFT FEMORAL-PERONEAL Right 04/13/2021   Procedure: RIGHT LEG FEMORAL-PERONEAL BYPASS USING PROPATEN GRAFT;  Surgeon: Serafina Mitchell, MD;  [facility];  Service: Vascular;  Laterality: Right;   COLONOSCOPY     NO PAST SURGERIES     PATCH ANGIOPLASTY Right 04/13/2021   Procedure: Mike Gip USING VEIN PATCH ANGIOPLASTY;  Surgeon: Serafina Mitchell, MD;  [facility];  Service: Vascular;  Laterality: Right;   PERIPHERAL VASCULAR BALLOON ANGIOPLASTY Right 07/18/2020   Procedure: PERIPHERAL VASCULAR BALLOON ANGIOPLASTY;  Surgeon: Serafina Mitchell, MD;  Location: [facility];  Service: Cardiovascular;  Laterality: Right;  tibioperoneal trunk and posterior tibial   PERIPHERAL VASCULAR INTERVENTION  07/04/2020   Procedure: PERIPHERAL VASCULAR INTERVENTION;  Surgeon: Serafina Mitchell, MD;  Location: [place] [facility];  Service: Cardiovascular;;  Lt. SFA and Popliteal   PERIPHERAL VASCULAR INTERVENTION Right 07/18/2020   Procedure: PERIPHERAL VASCULAR INTERVENTION;  Surgeon: Serafina Mitchell, MD;  Location: [place] [facility];  Service: Cardiovascular;  Laterality: Right;  Femoral Popliteal   ROBOT ASSISTED LAPAROSCOPIC RADICAL PROSTATECTOMY N/A 01/08/2021   Procedure: XI ROBOTIC ASSISTED LAPAROSCOPIC RADICAL PROSTATECTOMY LEVEL 3;  Surgeon: Raynelle Bring, MD;  [facility]: [facility];  Service: Urology;  Laterality: N/A;    Social History   Socioeconomic History   Marital status:  Married    Spouse name: Not on file   Number of children: 2   Years of education: Not on file   Highest education level: Not on file  Occupational History   Not on file  Tobacco Use   Smoking status: Former    Packs/day: 0.25    Types: Cigarettes    Quit date: 04/22/2021    Years since quitting: 0.4   Smokeless tobacco:  Never   Tobacco comments:    tryimg to quit  Vaping Use   Vaping Use: Never used  Substance and Sexual Activity   Alcohol use: Not Currently    Comment: beer rare   Drug use: No   Sexual activity: Not Currently  Other Topics Concern   Not on file  Social History Narrative   Not on file   Social Determinants of Health   Financial Resource Strain: Not on file  Food Insecurity: Not on file  Transportation Needs: Not on file  Physical Activity: Not on file  Stress: Not on file  Social Connections: Not on file  Intimate Partner Violence: Not on file    Family History  Problem Relation Age of Onset   Stroke Mother    Diabetes Father    Hypertension Sister    Kidney failure Brother        dialysis   Diabetes Paternal Grandfather    Anuerysm Brother    Colon cancer Neg Hx    Esophageal cancer Neg Hx    Rectal cancer Neg Hx    Stomach cancer Neg Hx     Current Outpatient Medications  Medication Sig Dispense Refill   amLODipine-benazepril (LOTREL) 10-40 MG capsule TAKE 1 CAPSULE BY MOUTH  DAILY 90 capsule 3   aspirin EC 81 MG tablet Take 81 mg by mouth daily. Swallow whole.     collagenase (SANTYL) ointment Apply 1 application topically daily as needed (toe wound).     hydrochlorothiazide (MICROZIDE) 12.5 MG capsule TAKE 1 CAPSULE BY MOUTH  DAILY 90 capsule 3   meloxicam (MOBIC) 15 MG tablet Take 1 tablet (15 mg total) by mouth daily as needed. 90 tablet 1   povidone-iodine (BETADINE) 10 % ointment Apply 1 application topically as needed for wound care (toe wound).     rosuvastatin (CRESTOR) 40 MG tablet Take 1 tablet (40 mg total) by mouth daily. 90 tablet 3   No current facility-administered medications for this visit.    No Known Allergies   REVIEW OF SYSTEMS:   [X]  denotes positive finding, [ ]  denotes negative finding Cardiac  Comments:  Chest pain or chest pressure:    Shortness of breath upon exertion:    Short of breath when lying flat:    Irregular heart  rhythm:        Vascular    Pain in calf, thigh, or hip brought on by ambulation:    Pain in feet at night that wakes you up from your sleep:     Blood clot in your veins:    Leg swelling:         Pulmonary    Oxygen at home:    Productive cough:     Wheezing:         Neurologic    Sudden weakness in arms or legs:     Sudden numbness in arms or legs:     Sudden onset of difficulty speaking or slurred speech:    Temporary loss of vision in one eye:  Problems with dizziness:         Gastrointestinal    Blood in stool:     Vomited blood:         Genitourinary    Burning when urinating:     Blood in urine:        Psychiatric    Major depression:         Hematologic    Bleeding problems:    Problems with blood clotting too easily:        Skin    Rashes or ulcers:        Constitutional    Fever or chills:      PHYSICAL EXAMINATION:  Vitals:   10/01/21 1400  BP: 114/65  Pulse: 70  Resp: 18  Temp: 97.8 F (36.6 C)  TempSrc: Temporal  SpO2: 100%  Weight: 165 lb (74.8 kg)  Height: 5\' 10"  (1.778 m)    General:  WDWN in NAD; vital signs documented above Gait: Not observed HENT: WNL, normocephalic Pulmonary: normal non-labored breathing , without Rales, rhonchi,  wheezing Cardiac: regular HR Abdomen: soft, NT, no masses Skin: without rashes Vascular Exam/Pulses:  Right Left  DP absent absent  PT absent absent   Extremities: without ischemic changes, without Gangrene , without cellulitis; without open wounds;  Musculoskeletal: no muscle wasting or atrophy  Neurologic: A&O X 3;  No focal weakness or paresthesias are detected Psychiatric:  The pt has Normal affect.   Non-Invasive Vascular Imaging:   Right femoral to peroneal bypass widely patent without flow-limiting stenosis Left SFA/popliteal stent patent without flow-limiting stenosis   ABI/TBI Today's ABI Today's TBI Previous ABI Previous TBI    +-------+-----------+-----------+------------+------------+   Right   1.23        0.62        1.11         not obtained   +-------+-----------+-----------+------------+------------+   Left    0.97        absent      1.02         absent          ASSESSMENT/PLAN:: 71 y.o. male here for surveillance of PAD  -Subjectively patient has not developed any claudication, rest pain, or nonhealing wounds since last office visit; right great toe ulcer has nearly healed; this is followed by podiatry -Imaging studies demonstrate a widely patent left SFA and popliteal artery stent.  Right leg bypass also widely patent without any flow-limiting stenosis today -Continue aspirin and statin daily -Encourage patient to walk and stay active is much as possible -Repeat imaging studies again in 6 months.  If at that time imaging studies are stable, we will repeat on an annual basis   Dagoberto Ligas, PA-C [facility] [phone]  Clinic MD:   Carlis Abbott on call

## 2021-10-03 NOTE — Progress Notes (Signed)
Joshua Curtis D.[place] Phone: [phone]   Assessment and Plan:     1. Primary osteoarthritis of right shoulder 2. Chronic right shoulder pain -Chronic with exacerbation, subsequent visit - Acute flare of chronic right shoulder pain has nearly resolved after CSI at office visit on 09/13/2021 - Use Tylenol 500 to 1000 mg 2-3 times a day as needed for daily pain relief.  Can use ibuprofen once or twice a week as needed for breakthrough pain - Continue HEP for bilateral shoulders   3. Primary osteoarthritis of left shoulder 4. Chronic left shoulder pain -Chronic and stable, subsequent visit - Chronic mild pain that is largely unchanged from previous visit - Pain is not significant at this time, so patient declines CSI  - Use Tylenol 500 to 1000 mg 2-3 times a day as needed for daily pain relief.  Can use ibuprofen once or twice a week as needed for breakthrough pain - Continue HEP for bilateral shoulders  Pertinent previous records reviewed include none   Follow Up: As needed if no improvement or worsening of symptoms.  Could repeat CSI and right shoulder at 12/12/2021 or later, could do first CSI for left shoulder at any time   Subjective:   I, Joshua Curtis, am serving as a Education administrator for Doctor Glennon Mac  Chief Complaint: right shoulder pain   HPI:  09/13/2021 Patient is 71 year old male complaining of bilateral shoulder pain. Patient states when he raises his arm , was rx pain med from doctor Jenny Reichmann the pain has gotten better ,been going for a month maybe two,no radiating pain, no numbness, used to be a side sleeper,only hurts when he does fast movements right hurts more than his left , pain is deltoid pain right on top    10/04/2021 Patient states that shoulder is feeling good has been able to pain the kitchen and everything    Relevant Historical Information: Hypertension, PVD, history of  prostate cancer  Additional pertinent review of systems negative.   Current Outpatient Medications:    amLODipine-benazepril (LOTREL) 10-40 MG capsule, TAKE 1 CAPSULE BY MOUTH  DAILY, Disp: 90 capsule, Rfl: 3   aspirin EC 81 MG tablet, Take 81 mg by mouth daily. Swallow whole., Disp: , Rfl:    collagenase (SANTYL) ointment, Apply 1 application topically daily as needed (toe wound)., Disp: , Rfl:    hydrochlorothiazide (MICROZIDE) 12.5 MG capsule, TAKE 1 CAPSULE BY MOUTH  DAILY, Disp: 90 capsule, Rfl: 3   meloxicam (MOBIC) 15 MG tablet, Take 1 tablet (15 mg total) by mouth daily as needed., Disp: 90 tablet, Rfl: 1   povidone-iodine (BETADINE) 10 % ointment, Apply 1 application topically as needed for wound care (toe wound)., Disp: , Rfl:    rosuvastatin (CRESTOR) 40 MG tablet, Take 1 tablet (40 mg total) by mouth daily., Disp: 90 tablet, Rfl: 3   Objective:     Vitals:   10/04/21 1424  BP: 122/80  Pulse: 95  SpO2: 99%  Weight: 168 lb (76.2 kg)  Height: 5\' 10"  (1.778 m)      Body mass index is 24.11 kg/m.    Physical Exam:    Gen: Appears well, nad, nontoxic and pleasant Neuro:sensation intact, strength is 5/5 with df/pf/inv/ev, muscle tone wnl Skin: no suspicious lesion or defmority Psych: A&O, appropriate mood and affect  Bilateral shoulder: no deformity, swelling or muscle wasting No scapular winging FF 160, abd 160, int 10,  ext 80 NTTP over the [place], clavicle, ac, coracoid, biceps groove, humerus, deltoid, trapezius, cervical spine Neg neer, hawkings, empty can, subscap liftoff, speeds, obriens, crossarm Neg ant drawer, sulcus sign, apprehension Negative Spurling's test bilat FROM of neck    Electronically signed by:  Joshua Curtis D.[facility] 2:38 PM 10/04/21

## 2021-10-04 ENCOUNTER — Ambulatory Visit (INDEPENDENT_AMBULATORY_CARE_PROVIDER_SITE_OTHER): Payer: PPO | Admitting: Sports Medicine

## 2021-10-04 ENCOUNTER — Other Ambulatory Visit: Payer: Self-pay

## 2021-10-04 ENCOUNTER — Telehealth: Payer: Self-pay

## 2021-10-04 ENCOUNTER — Other Ambulatory Visit: Payer: Self-pay | Admitting: *Deleted

## 2021-10-04 VITALS — BP 122/80 | HR 95 | Ht 70.0 in | Wt 168.0 lb

## 2021-10-04 DIAGNOSIS — I739 Peripheral vascular disease, unspecified: Secondary | ICD-10-CM

## 2021-10-04 DIAGNOSIS — M19012 Primary osteoarthritis, left shoulder: Secondary | ICD-10-CM

## 2021-10-04 DIAGNOSIS — M19011 Primary osteoarthritis, right shoulder: Secondary | ICD-10-CM | POA: Diagnosis not present

## 2021-10-04 DIAGNOSIS — M25511 Pain in right shoulder: Secondary | ICD-10-CM | POA: Diagnosis not present

## 2021-10-04 DIAGNOSIS — M25512 Pain in left shoulder: Secondary | ICD-10-CM

## 2021-10-04 DIAGNOSIS — I7025 Atherosclerosis of native arteries of other extremities with ulceration: Secondary | ICD-10-CM

## 2021-10-04 DIAGNOSIS — G8929 Other chronic pain: Secondary | ICD-10-CM | POA: Diagnosis not present

## 2021-10-04 MED ORDER — HYDROCHLOROTHIAZIDE 12.5 MG PO CAPS: 12.5000 mg | ORAL_CAPSULE | Freq: Every day | ORAL | 3 refills | Status: DC

## 2021-10-04 MED ORDER — AMLODIPINE BESY-BENAZEPRIL HCL 10-40 MG PO CAPS: 1.0000 | ORAL_CAPSULE | Freq: Every day | ORAL | 3 refills | Status: DC

## 2021-10-04 NOTE — Telephone Encounter (Signed)
Patient arrived with questions about refills on Ammplodipine and Hydrochlorothiaz his refills to [facility] delivery was cancelled after retiring. [facility] to [facility]

## 2021-10-04 NOTE — Patient Instructions (Addendum)
Good to see you  Tylenol [phone] mg 2-3 times a day as needed for day to day pain  Ibuprofen as needed for breakthrough pain no more than 1-2 a week Continue shoulder exercises  As needed follow up any time you want an injection

## 2021-11-09 ENCOUNTER — Ambulatory Visit: Payer: PPO | Admitting: Podiatry

## 2021-11-09 ENCOUNTER — Other Ambulatory Visit: Payer: Self-pay

## 2021-11-09 DIAGNOSIS — L600 Ingrowing nail: Secondary | ICD-10-CM | POA: Diagnosis not present

## 2021-11-09 DIAGNOSIS — L84 Corns and callosities: Secondary | ICD-10-CM

## 2021-11-09 DIAGNOSIS — I739 Peripheral vascular disease, unspecified: Secondary | ICD-10-CM

## 2021-11-09 DIAGNOSIS — B351 Tinea unguium: Secondary | ICD-10-CM

## 2021-11-14 NOTE — Progress Notes (Signed)
Subjective: ?71 year old male presents the office today for follow-up evaluation of a wound on the right big toe as well as ingrown toenail left big toe.  He states the wounds have healed.  The right toenail is not growing out much.  He denies any open sores today and no drainage or pus or any swelling or redness.  Denies any fevers or chills.  He has no other concerns.   ? ?Objective: ?AAO x3, NAD ?There are no open lesions today.  There is no drainage or pus any signs of infection noted.  The left hallux toenail and right hallux toenail both hypertrophic, dystrophic with brown discoloration.  The left hallux nail has grown out and there is no significant incurvation of the nail today.  The right hallux nail is growing out about half way.  There is no edema, erythema or signs of infection noted today.  The nails are also mildly hypertrophic, dystrophic with discoloration. ?No pain with calf compression, swelling, warmth, erythema ? ?Assessment: ?Healed ulcerations, onychomycosis ? ?Plan: ?-All treatment options discussed with the patient including all alternatives, risks, complications.  ?-At this point the wounds are healed.  Watch for any reoccurrence.  Monitor for any signs or symptoms of infection. ?-Sharply debrided the nails without any complications or bleeding as a courtesy ? ?Trula Slade DPM ? ?

## 2021-11-29 ENCOUNTER — Encounter: Payer: Self-pay | Admitting: Internal Medicine

## 2021-11-29 ENCOUNTER — Ambulatory Visit (INDEPENDENT_AMBULATORY_CARE_PROVIDER_SITE_OTHER): Payer: PPO | Admitting: Internal Medicine

## 2021-11-29 ENCOUNTER — Telehealth: Payer: Self-pay

## 2021-11-29 VITALS — BP 102/50 | HR 64 | Temp 97.8°F | Ht 70.0 in | Wt 154.0 lb

## 2021-11-29 DIAGNOSIS — R1013 Epigastric pain: Secondary | ICD-10-CM

## 2021-11-29 DIAGNOSIS — E559 Vitamin D deficiency, unspecified: Secondary | ICD-10-CM

## 2021-11-29 DIAGNOSIS — Q2381 Bicuspid aortic valve: Secondary | ICD-10-CM

## 2021-11-29 DIAGNOSIS — Q231 Congenital insufficiency of aortic valve: Secondary | ICD-10-CM | POA: Diagnosis not present

## 2021-11-29 DIAGNOSIS — R112 Nausea with vomiting, unspecified: Secondary | ICD-10-CM | POA: Diagnosis not present

## 2021-11-29 LAB — URINALYSIS, ROUTINE W REFLEX MICROSCOPIC
Ketones, ur: NEGATIVE
Leukocytes,Ua: NEGATIVE
Nitrite: NEGATIVE
Specific Gravity, Urine: >=1.03 — AB (ref 1.000–1.030)
Total Protein, Urine: 100 — AB
Urine Glucose: NEGATIVE
Urobilinogen, UA: 0.2 (ref 0.0–1.0)
pH: 5.5 (ref 5.0–8.0)

## 2021-11-29 LAB — BASIC METABOLIC PANEL
BUN: 177 mg/dL (ref 6–23)
CO2: 14 mEq/L — ABNORMAL LOW (ref 19–32)
Calcium: 9 mg/dL (ref 8.4–10.5)
Chloride: 102 mEq/L (ref 96–112)
Creatinine, Ser: 13.85 mg/dL (ref 0.40–1.50)
GFR: 3.3 mL/min — CL (ref >60.00)
Glucose, Bld: 107 mg/dL — ABNORMAL HIGH (ref 70–99)
Potassium: 4.6 mEq/L (ref 3.5–5.1)
Sodium: 135 mEq/L (ref 135–145)

## 2021-11-29 LAB — CBC WITH DIFFERENTIAL/PLATELET
Basophils Absolute: 0.1 10*3/uL (ref 0.0–0.1)
Basophils Relative: 1.2 % (ref 0.0–3.0)
Eosinophils Absolute: 0.4 10*3/uL (ref 0.0–0.7)
Eosinophils Relative: 5.3 % — ABNORMAL HIGH (ref 0.0–5.0)
HCT: 33.8 % — ABNORMAL LOW (ref 39.0–52.0)
Hemoglobin: 11.5 g/dL — ABNORMAL LOW (ref 13.0–17.0)
Lymphocytes Relative: 13.2 % (ref 12.0–46.0)
Lymphs Abs: 1.1 10*3/uL (ref 0.7–4.0)
MCHC: 34 g/dL (ref 30.0–36.0)
MCV: 87.7 fl (ref 78.0–100.0)
Monocytes Absolute: 0.8 10*3/uL (ref 0.1–1.0)
Monocytes Relative: 9.1 % (ref 3.0–12.0)
Neutro Abs: 5.9 10*3/uL (ref 1.4–7.7)
Neutrophils Relative %: 71.2 % (ref 43.0–77.0)
Platelets: 280 10*3/uL (ref 150.0–400.0)
RBC: 3.85 Mil/uL — ABNORMAL LOW (ref 4.22–5.81)
RDW: 17.7 % — ABNORMAL HIGH (ref 11.5–15.5)
WBC: 8.3 10*3/uL (ref 4.0–10.5)

## 2021-11-29 LAB — LIPASE: Lipase: 60 U/L — ABNORMAL HIGH (ref 11.0–59.0)

## 2021-11-29 LAB — HEPATIC FUNCTION PANEL
ALT: 16 U/L (ref 0–53)
AST: 17 U/L (ref 0–37)
Albumin: 3.9 g/dL (ref 3.5–5.2)
Alkaline Phosphatase: 48 U/L (ref 39–117)
Bilirubin, Direct: 0 mg/dL (ref 0.0–0.3)
Total Bilirubin: 0.2 mg/dL (ref 0.2–1.2)
Total Protein: 6.9 g/dL (ref 6.0–8.3)

## 2021-11-29 MED ORDER — PANTOPRAZOLE SODIUM 40 MG PO TBEC: 40.0000 mg | DELAYED_RELEASE_TABLET | Freq: Every day | ORAL | 3 refills | Status: DC

## 2021-11-29 MED ORDER — SUCRALFATE 1 G PO TABS: 1.0000 g | ORAL_TABLET | Freq: Four times a day (QID) | ORAL | 0 refills | Status: DC

## 2021-11-29 NOTE — Progress Notes (Signed)
Patient ID: Joshua Curtis, male   DOB: 1951/07/08, 71 y.o.   MRN: [id number] ? ? ? ?    Chief Complaint: follow up here with acutely feeling ill, epigastric pain ? ?     HPI:  Joshua Curtis is a 71 y.o. male here with c/o  4 days onset epigastric pain primarialy with radiation to the whole abdomen but not the back, mod to occasionally severe, constant, dull and sharp, has been taking NSAID, but no  ?eTOH use, some better to lean forward while sitting, worse to palpate the area, taking po fluids ok but appetite lower and several episiode nausea vomiting, Denies worsening reflux, dysphagia, n/v, bowel change or blood. Pt denies chest pain, increased sob or doe, wheezing, orthopnea, PND, increased LE swelling, palpitations, dizziness or syncope.   Pt denies polydipsia, polyuria, or new focal neuro s/s.   Pt denies fever, wt loss, night sweats, loss of appetite, or other constitutional symptoms  Not taking Vit D.  Also noted incidentally today is AoV bicuspid by CT june 2022.   ?      ?Wt Readings from Last 3 Encounters:  ?11/29/21 154 lb (69.9 kg)  ?10/04/21 168 lb (76.2 kg)  ?10/01/21 165 lb (74.8 kg)  ? ?BP Readings from Last 3 Encounters:  ?11/29/21 (!) 102/50  ?10/04/21 122/80  ?10/01/21 114/65  ? ?      ?Past Medical History:  ?Diagnosis Date  ? Anemia, iron deficiency 04/02/2016  ? Aortic atherosclerosis ([place])   ? Arthritis   ? rt knee  ? BPH (benign prostatic hyperplasia) 04/10/2017  ? Cancer [facility])   ? prostate  ? COLONIC POLYPS, HX OF 11/24/2007  ? COPD (chronic obstructive pulmonary disease) ([place])   ? pt denies this- no respiratory issues of any kind per pt  ? Coronary artery calcification seen on CAT scan   ? DIVERTICULOSIS, COLON 11/24/2007  ? ED (erectile dysfunction)   ? GERD (gastroesophageal reflux disease)   ? HLD (hyperlipidemia) 09/06/2020  ? HYPERTENSION 11/24/2007  ? Increased prostate specific antigen (PSA) velocity 06/22/2011  ? PAH (pulmonary artery hypertension) ([place])   ? Peripheral vascular disease  ([place])   ? ?Past Surgical History:  ?Procedure Laterality Date  ? ABDOMINAL AORTOGRAM W/LOWER EXTREMITY N/A 07/04/2020  ? Procedure: ABDOMINAL AORTOGRAM W/LOWER EXTREMITY;  Surgeon: Serafina Mitchell, MD;  Location: [place] [facility];  Service: Cardiovascular;  Laterality: N/A;  ? ABDOMINAL AORTOGRAM W/LOWER EXTREMITY N/A 07/18/2020  ? Procedure: ABDOMINAL AORTOGRAM W/LOWER EXTREMITY;  Surgeon: Serafina Mitchell, MD;  Location: [place] [facility];  Service: Cardiovascular;  Laterality: N/A;  ? ABDOMINAL AORTOGRAM W/LOWER EXTREMITY N/A 04/04/2021  ? Procedure: ABDOMINAL AORTOGRAM W/LOWER EXTREMITY;  Surgeon: Cherre Robins, MD;  Location: [place] [facility];  Service: Cardiovascular;  Laterality: N/A;  ? BYPASS GRAFT FEMORAL-PERONEAL Right 04/13/2021  ? Procedure: RIGHT LEG FEMORAL-PERONEAL BYPASS USING PROPATEN GRAFT;  Surgeon: Serafina Mitchell, MD;  [facility];  Service: Vascular;  Laterality: Right;  ? COLONOSCOPY    ? NO PAST SURGERIES    ? PATCH ANGIOPLASTY Right 04/13/2021  ? Procedure: ILIEO-FEMORAL USING VEIN PATCH ANGIOPLASTY;  Surgeon: Serafina Mitchell, MD;  [facility];  Service: Vascular;  Laterality: Right;  ? PERIPHERAL VASCULAR BALLOON ANGIOPLASTY Right 07/18/2020  ? Procedure: PERIPHERAL VASCULAR BALLOON ANGIOPLASTY;  Surgeon: Serafina Mitchell, MD;  Location: [place] [facility];  Service: Cardiovascular;  Laterality: Right;  tibioperoneal trunk and posterior tibial  ? PERIPHERAL VASCULAR INTERVENTION  07/04/2020  ? Procedure: PERIPHERAL VASCULAR INTERVENTION;  Surgeon: Serafina Mitchell, MD;  Location: [place] [facility];  Service: Cardiovascular;;  Lt. SFA and Popliteal  ? PERIPHERAL VASCULAR INTERVENTION Right 07/18/2020  ? Procedure: PERIPHERAL VASCULAR INTERVENTION;  Surgeon: Serafina Mitchell, MD;  Location: [place] [facility];  Service: Cardiovascular;  Laterality: Right;  Femoral Popliteal  ? ROBOT ASSISTED LAPAROSCOPIC RADICAL PROSTATECTOMY N/A 01/08/2021  ? Procedure: XI ROBOTIC ASSISTED  LAPAROSCOPIC RADICAL PROSTATECTOMY LEVEL 3;  Surgeon: Raynelle Bring, MD;  [facility];  Service: Urology;  Laterality: N/A;  ? ? reports that he quit smoking about 7 months ago. His smoking use included cigarettes. He smoked an average of .25 packs per day. He has never used smokeless tobacco. He reports that he does not currently use alcohol. He reports that he does not use drugs. ?family history includes Anuerysm in his brother; Diabetes in his father and paternal grandfather; Hypertension in his sister; Kidney failure in his brother; Stroke in his mother. ?No Known Allergies ?Current Outpatient Medications on File Prior to Visit  ?Medication Sig Dispense Refill  ? amLODipine-benazepril (LOTREL) 10-40 MG capsule Take 1 capsule by mouth daily. 90 capsule 3  ? aspirin EC 81 MG tablet Take 81 mg by mouth daily. Swallow whole.    ? collagenase (SANTYL) ointment Apply 1 application topically daily as needed (toe wound).    ? hydrochlorothiazide (MICROZIDE) 12.5 MG capsule Take 1 capsule (12.5 mg total) by mouth daily. 90 capsule 3  ? povidone-iodine (BETADINE) 10 % ointment Apply 1 application topically as needed for wound care (toe wound).    ? rosuvastatin (CRESTOR) 40 MG tablet Take 1 tablet (40 mg total) by mouth daily. 90 tablet 3  ? ?No current facility-administered medications on file prior to visit.  ? ?     ROS:  All others reviewed and negative. ? ?Objective  ? ?     PE:  BP (!) 102/50 (BP Location: Right Arm, Patient Position: Sitting, Cuff Size: Normal)   Pulse 64   Temp 97.8 ?F (36.6 ?C) (Oral)   Ht '5\' 10"'$  (1.778 m)   Wt 154 lb (69.9 kg)   SpO2 98%   BMI 22.10 kg/m?  ? ?              Constitutional: Pt appears in NAD ?              HENT: Head: NCAT.  ?              Right Ear: External ear normal.   ?              Left Ear: External ear normal.  ?              Eyes: . Pupils are equal, round, and reactive to light. Conjunctivae and EOM are normal ?              Nose: without d/c or deformity ?               Neck: Neck supple. Gross normal ROM ?              Cardiovascular: Normal rate and regular rhythm.   ?              Pulmonary/Chest: Effort normal and breath sounds without rales or wheezing.  ?              Abd:  Soft, mod epigaastric tender, ND, + BS, no  organomegaly ?              Neurological: Pt is alert. At baseline orientation, motor grossly intact ?              Skin: Skin is warm. No rashes, no other new lesions, LE edema - none ?              Psychiatric: Pt behavior is normal without agitation  ? ?Micro: none ? ?Cardiac tracings I have personally interpreted today:  none ? ?Pertinent Radiological findings (summarize): none  ? ?Lab Results  ?Component Value Date  ? WBC 8.3 11/29/2021  ? HGB 11.5 (L) 11/29/2021  ? HCT 33.8 (L) 11/29/2021  ? PLT 280.0 11/29/2021  ? GLUCOSE 107 (H) 11/29/2021  ? CHOL 78 09/07/2021  ? TRIG 107.0 09/07/2021  ? HDL 28.60 (L) 09/07/2021  ? LDLDIRECT 74.0 04/21/2019  ? [place] 28 09/07/2021  ? ALT 16 11/29/2021  ? AST 17 11/29/2021  ? NA 135 11/29/2021  ? K 4.6 11/29/2021  ? CL 102 11/29/2021  ? CREATININE 13.85 (HH) 11/29/2021  ? BUN 177 (HH) 11/29/2021  ? CO2 14 (L) 11/29/2021  ? TSH 0.98 09/07/2021  ? PSA 0.00 (L) 09/07/2021  ? INR 1.0 04/13/2021  ? HGBA1C 5.6 09/07/2021  ? ?Assessment/Plan:  ?Joshua Curtis is a 71 y.o. Black or African American [2] male with  has a past medical history of Anemia, iron deficiency (04/02/2016), Aortic atherosclerosis ([place]), Arthritis, BPH (benign prostatic hyperplasia) (04/10/2017), Cancer ([place]), COLONIC POLYPS, HX OF (11/24/2007), COPD (chronic obstructive pulmonary disease) ([place]), Coronary artery calcification seen on CAT scan, DIVERTICULOSIS, COLON (11/24/2007), ED (erectile dysfunction), GERD (gastroesophageal reflux disease), HLD (hyperlipidemia) (09/06/2020), HYPERTENSION (11/24/2007), Increased prostate specific antigen (PSA) velocity (06/22/2011), PAH (pulmonary artery hypertension) ([place]), and Peripheral vascular disease  ([place]). ? ?Epigastric pain ?Etiology unclear, to d/c mobic, for protonix and carafate asd, labs as ordered and CT abd/pelvis urgent, refer GI ? ?Nausea and vomiting ?Mild, still able to take po adequate, declines other tx,

## 2021-11-29 NOTE — Assessment & Plan Note (Signed)
Mild, still able to take po adequate, declines other tx,  to f/u any worsening symptoms or concerns ?

## 2021-11-29 NOTE — Assessment & Plan Note (Signed)
Etiology unclear, to d/c mobic, for protonix and carafate asd, labs as ordered and CT abd/pelvis urgent, refer GI ?

## 2021-11-29 NOTE — Assessment & Plan Note (Signed)
Incidental, also for Cardiology referral ?

## 2021-11-29 NOTE — Assessment & Plan Note (Signed)
Last vitamin D ?Lab Results  ?Component Value Date  ? VD25OH 11.07 (L) 09/07/2021  ? ?Low, to start oral replacement ? ?

## 2021-11-29 NOTE — Telephone Encounter (Signed)
CRITICAL VALUE STICKER ? ?CRITICAL VALUE: BUN: 177 ?         Creatinine: 13.85 ?         GFR: 3.30 ? ?RECEIVER (on-site recipient of call): Lovena Le A. Alroy Dust, CMA ? ?DATE & TIME NOTIFIED: 11/29/21 at 4:39p ? ?MESSENGER (representative from lab): Hope ? ?MD NOTIFIED: yes ? ?TIME OF NOTIFICATION: 4:40p ? ?RESPONSE: waiting for response ? ?

## 2021-11-29 NOTE — Patient Instructions (Addendum)
Ok to stop the meloxicam (mobic) ? ?Please take all new medication as prescribed - the protonix, and carafate as prescribed ? ?Please continue all other medications as before, and refills have been done if requested. ? ?Please have the pharmacy call with any other refills you may need. ? ?Please keep your appointments with your specialists as you may have planned ? ?You will be contacted regarding the referral for: CT abd/pelvis asap, and Gastroenterology ? ?You will be contacted regarding the referral for: cardiology for the possible bicuspid aortic valve we happened to find on your CT chest test done earlier this year ? ?Please go to the LAB at the blood drawing area for the tests to be done ? ?You will be contacted by phone if any changes need to be made immediately.  Otherwise, you will receive a letter about your results with an explanation, but please check with MyChart first. ? ?Please remember to sign up for MyChart if you have not done so, as this will be important to you in the future with finding out test results, communicating by private email, and scheduling acute appointments online when needed. ? ? ? ? ? ?

## 2021-12-08 ENCOUNTER — Emergency Department (HOSPITAL_COMMUNITY): Payer: PPO

## 2021-12-08 ENCOUNTER — Encounter (HOSPITAL_COMMUNITY): Payer: Self-pay | Admitting: Nephrology

## 2021-12-08 ENCOUNTER — Inpatient Hospital Stay (HOSPITAL_COMMUNITY): Payer: PPO

## 2021-12-08 ENCOUNTER — Other Ambulatory Visit: Payer: Self-pay

## 2021-12-08 ENCOUNTER — Inpatient Hospital Stay (HOSPITAL_COMMUNITY)
Admission: EM | Admit: 2021-12-08 | Discharge: 2021-12-13 | DRG: 377 | Disposition: A | Discharge status: Home / Self Care | Payer: PPO | Attending: Student | Admitting: Student

## 2021-12-08 DIAGNOSIS — N400 Enlarged prostate without lower urinary tract symptoms: Secondary | ICD-10-CM | POA: Diagnosis not present

## 2021-12-08 DIAGNOSIS — N19 Unspecified kidney failure: Secondary | ICD-10-CM

## 2021-12-08 DIAGNOSIS — E870 Hyperosmolality and hypernatremia: Secondary | ICD-10-CM

## 2021-12-08 DIAGNOSIS — N529 Male erectile dysfunction, unspecified: Secondary | ICD-10-CM | POA: Diagnosis not present

## 2021-12-08 DIAGNOSIS — K2971 Gastritis, unspecified, with bleeding: Secondary | ICD-10-CM | POA: Diagnosis not present

## 2021-12-08 DIAGNOSIS — I10 Essential (primary) hypertension: Secondary | ICD-10-CM

## 2021-12-08 DIAGNOSIS — R112 Nausea with vomiting, unspecified: Secondary | ICD-10-CM | POA: Diagnosis not present

## 2021-12-08 DIAGNOSIS — R579 Shock, unspecified: Secondary | ICD-10-CM

## 2021-12-08 DIAGNOSIS — K219 Gastro-esophageal reflux disease without esophagitis: Secondary | ICD-10-CM | POA: Diagnosis present

## 2021-12-08 DIAGNOSIS — Z8249 Family history of ischemic heart disease and other diseases of the circulatory system: Secondary | ICD-10-CM

## 2021-12-08 DIAGNOSIS — Z8546 Personal history of malignant neoplasm of prostate: Secondary | ICD-10-CM

## 2021-12-08 DIAGNOSIS — N179 Acute kidney failure, unspecified: Secondary | ICD-10-CM

## 2021-12-08 DIAGNOSIS — N281 Cyst of kidney, acquired: Secondary | ICD-10-CM | POA: Diagnosis not present

## 2021-12-08 DIAGNOSIS — T39395A Adverse effect of other nonsteroidal anti-inflammatory drugs [NSAID], initial encounter: Secondary | ICD-10-CM | POA: Diagnosis not present

## 2021-12-08 DIAGNOSIS — R1013 Epigastric pain: Secondary | ICD-10-CM | POA: Diagnosis not present

## 2021-12-08 DIAGNOSIS — Z4682 Encounter for fitting and adjustment of non-vascular catheter: Secondary | ICD-10-CM | POA: Diagnosis not present

## 2021-12-08 DIAGNOSIS — N2889 Other specified disorders of kidney and ureter: Secondary | ICD-10-CM | POA: Diagnosis present

## 2021-12-08 DIAGNOSIS — I739 Peripheral vascular disease, unspecified: Secondary | ICD-10-CM | POA: Diagnosis present

## 2021-12-08 DIAGNOSIS — I9589 Other hypotension: Secondary | ICD-10-CM | POA: Diagnosis not present

## 2021-12-08 DIAGNOSIS — E876 Hypokalemia: Secondary | ICD-10-CM

## 2021-12-08 DIAGNOSIS — R64 Cachexia: Secondary | ICD-10-CM | POA: Diagnosis present

## 2021-12-08 DIAGNOSIS — K3182 Dieulafoy lesion (hemorrhagic) of stomach and duodenum: Principal | ICD-10-CM

## 2021-12-08 DIAGNOSIS — J449 Chronic obstructive pulmonary disease, unspecified: Secondary | ICD-10-CM

## 2021-12-08 DIAGNOSIS — Z79899 Other long term (current) drug therapy: Secondary | ICD-10-CM

## 2021-12-08 DIAGNOSIS — R195 Other fecal abnormalities: Secondary | ICD-10-CM | POA: Diagnosis not present

## 2021-12-08 DIAGNOSIS — Z452 Encounter for adjustment and management of vascular access device: Secondary | ICD-10-CM | POA: Diagnosis not present

## 2021-12-08 DIAGNOSIS — D649 Anemia, unspecified: Secondary | ICD-10-CM | POA: Diagnosis not present

## 2021-12-08 DIAGNOSIS — E785 Hyperlipidemia, unspecified: Secondary | ICD-10-CM | POA: Diagnosis not present

## 2021-12-08 DIAGNOSIS — Z9079 Acquired absence of other genital organ(s): Secondary | ICD-10-CM

## 2021-12-08 DIAGNOSIS — Z87891 Personal history of nicotine dependence: Secondary | ICD-10-CM

## 2021-12-08 DIAGNOSIS — R1084 Generalized abdominal pain: Secondary | ICD-10-CM | POA: Diagnosis not present

## 2021-12-08 DIAGNOSIS — I2510 Atherosclerotic heart disease of native coronary artery without angina pectoris: Secondary | ICD-10-CM | POA: Diagnosis present

## 2021-12-08 DIAGNOSIS — K6389 Other specified diseases of intestine: Secondary | ICD-10-CM | POA: Diagnosis not present

## 2021-12-08 DIAGNOSIS — Z8719 Personal history of other diseases of the digestive system: Secondary | ICD-10-CM

## 2021-12-08 DIAGNOSIS — R55 Syncope and collapse: Secondary | ICD-10-CM | POA: Diagnosis present

## 2021-12-08 DIAGNOSIS — I700 Atherosclerosis of aorta: Secondary | ICD-10-CM | POA: Diagnosis not present

## 2021-12-08 DIAGNOSIS — I959 Hypotension, unspecified: Secondary | ICD-10-CM | POA: Diagnosis not present

## 2021-12-08 DIAGNOSIS — K921 Melena: Secondary | ICD-10-CM | POA: Diagnosis present

## 2021-12-08 DIAGNOSIS — R578 Other shock: Secondary | ICD-10-CM | POA: Diagnosis not present

## 2021-12-08 DIAGNOSIS — K922 Gastrointestinal hemorrhage, unspecified: Secondary | ICD-10-CM | POA: Diagnosis not present

## 2021-12-08 DIAGNOSIS — Z7982 Long term (current) use of aspirin: Secondary | ICD-10-CM

## 2021-12-08 DIAGNOSIS — R627 Adult failure to thrive: Secondary | ICD-10-CM | POA: Diagnosis present

## 2021-12-08 DIAGNOSIS — M1990 Unspecified osteoarthritis, unspecified site: Secondary | ICD-10-CM | POA: Diagnosis not present

## 2021-12-08 DIAGNOSIS — K2970 Gastritis, unspecified, without bleeding: Secondary | ICD-10-CM | POA: Diagnosis not present

## 2021-12-08 DIAGNOSIS — K3189 Other diseases of stomach and duodenum: Secondary | ICD-10-CM | POA: Diagnosis not present

## 2021-12-08 DIAGNOSIS — E861 Hypovolemia: Secondary | ICD-10-CM

## 2021-12-08 DIAGNOSIS — D696 Thrombocytopenia, unspecified: Secondary | ICD-10-CM | POA: Diagnosis not present

## 2021-12-08 DIAGNOSIS — D509 Iron deficiency anemia, unspecified: Secondary | ICD-10-CM | POA: Diagnosis not present

## 2021-12-08 DIAGNOSIS — K31811 Angiodysplasia of stomach and duodenum with bleeding: Secondary | ICD-10-CM | POA: Diagnosis present

## 2021-12-08 DIAGNOSIS — Z6822 Body mass index (BMI) 22.0-22.9, adult: Secondary | ICD-10-CM

## 2021-12-08 DIAGNOSIS — Z86010 Personal history of colonic polyps: Secondary | ICD-10-CM

## 2021-12-08 DIAGNOSIS — D62 Acute posthemorrhagic anemia: Secondary | ICD-10-CM | POA: Diagnosis not present

## 2021-12-08 DIAGNOSIS — E869 Volume depletion, unspecified: Secondary | ICD-10-CM | POA: Diagnosis not present

## 2021-12-08 DIAGNOSIS — R109 Unspecified abdominal pain: Secondary | ICD-10-CM | POA: Diagnosis not present

## 2021-12-08 LAB — CBC
HCT: 18.7 % — ABNORMAL LOW (ref 39.0–52.0)
HCT: 24.7 % — ABNORMAL LOW (ref 39.0–52.0)
Hemoglobin: 6.3 g/dL — CL (ref 13.0–17.0)
Hemoglobin: 8 g/dL — ABNORMAL LOW (ref 13.0–17.0)
MCH: 29.6 pg (ref 26.0–34.0)
MCH: 31.1 pg (ref 26.0–34.0)
MCHC: 33.7 g/dL (ref 30.0–36.0)
MCHC: 32.4 g/dL (ref 30.0–36.0)
MCV: 87.8 fL (ref 80.0–100.0)
MCV: 96.1 fL (ref 80.0–100.0)
Platelets: 184 10*3/uL (ref 150–400)
Platelets: 119 10*3/uL — ABNORMAL LOW (ref 150–400)
RBC: 2.13 MIL/uL — ABNORMAL LOW (ref 4.22–5.81)
RBC: 2.57 MIL/uL — ABNORMAL LOW (ref 4.22–5.81)
RDW: 17.1 % — ABNORMAL HIGH (ref 11.5–15.5)
RDW: 18.4 % — ABNORMAL HIGH (ref 11.5–15.5)
WBC: 7.8 10*3/uL (ref 4.0–10.5)
WBC: 8.6 10*3/uL (ref 4.0–10.5)
nRBC: 0 % (ref 0.0–0.2)
nRBC: 0 % (ref 0.0–0.2)

## 2021-12-08 LAB — BLOOD GAS, ARTERIAL
Acid-base deficit: 15.1 mmol/L — ABNORMAL HIGH (ref 0.0–2.0)
Allens test (pass/fail): POSITIVE — AB
Bicarbonate: 9.9 mmol/L — ABNORMAL LOW (ref 20.0–28.0)
FIO2: 21 %
O2 Saturation: 100 %
Patient temperature: 37
pCO2 arterial: 22 mmHg — ABNORMAL LOW (ref 32–48)
pH, Arterial: 7.26 — ABNORMAL LOW (ref 7.35–7.45)
pO2, Arterial: 106 mmHg (ref 83–108)

## 2021-12-08 LAB — COMPREHENSIVE METABOLIC PANEL
ALT: 13 U/L (ref 0–44)
AST: 9 U/L — ABNORMAL LOW (ref 15–41)
Albumin: 3 g/dL — ABNORMAL LOW (ref 3.5–5.0)
Alkaline Phosphatase: 30 U/L — ABNORMAL LOW (ref 38–126)
Anion gap: 15 (ref 5–15)
BUN: 274 mg/dL — ABNORMAL HIGH (ref 8–23)
CO2: 13 mmol/L — ABNORMAL LOW (ref 22–32)
Calcium: 8.2 mg/dL — ABNORMAL LOW (ref 8.9–10.3)
Chloride: 111 mmol/L (ref 98–111)
Creatinine, Ser: 15.7 mg/dL — ABNORMAL HIGH (ref 0.61–1.24)
GFR, Estimated: 3 mL/min — ABNORMAL LOW (ref >60)
Glucose, Bld: 155 mg/dL — ABNORMAL HIGH (ref 70–99)
Potassium: 4.7 mmol/L (ref 3.5–5.1)
Sodium: 139 mmol/L (ref 135–145)
Total Bilirubin: 0.3 mg/dL (ref 0.3–1.2)
Total Protein: 5.7 g/dL — ABNORMAL LOW (ref 6.5–8.1)

## 2021-12-08 LAB — PROTIME-INR
INR: 1.5 — ABNORMAL HIGH (ref 0.8–1.2)
Prothrombin Time: 17.6 seconds — ABNORMAL HIGH (ref 11.4–15.2)

## 2021-12-08 LAB — URINALYSIS, ROUTINE W REFLEX MICROSCOPIC
Bilirubin Urine: NEGATIVE
Glucose, UA: NEGATIVE mg/dL
Hgb urine dipstick: NEGATIVE
Ketones, ur: NEGATIVE mg/dL
Leukocytes,Ua: NEGATIVE
Nitrite: NEGATIVE
Protein, ur: NEGATIVE mg/dL
Specific Gravity, Urine: 1.013 (ref 1.005–1.030)
pH: 5 (ref 5.0–8.0)

## 2021-12-08 LAB — POC OCCULT BLOOD, ED: Fecal Occult Bld: POSITIVE — AB

## 2021-12-08 LAB — PREPARE RBC (CROSSMATCH)

## 2021-12-08 LAB — BASIC METABOLIC PANEL
Anion gap: 12 (ref 5–15)
BUN: 230 mg/dL — ABNORMAL HIGH (ref 8–23)
CO2: 11 mmol/L — ABNORMAL LOW (ref 22–32)
Calcium: 7.5 mg/dL — ABNORMAL LOW (ref 8.9–10.3)
Chloride: 121 mmol/L — ABNORMAL HIGH (ref 98–111)
Creatinine, Ser: 11.09 mg/dL — ABNORMAL HIGH (ref 0.61–1.24)
GFR, Estimated: 5 mL/min — ABNORMAL LOW (ref >60)
Glucose, Bld: 132 mg/dL — ABNORMAL HIGH (ref 70–99)
Potassium: 4.3 mmol/L (ref 3.5–5.1)
Sodium: 144 mmol/L (ref 135–145)

## 2021-12-08 LAB — TROPONIN I (HIGH SENSITIVITY)
Troponin I (High Sensitivity): 17 ng/L (ref <18)
Troponin I (High Sensitivity): 16 ng/L (ref <18)

## 2021-12-08 LAB — MAGNESIUM: Magnesium: 2.2 mg/dL (ref 1.7–2.4)

## 2021-12-08 LAB — LIPASE, BLOOD: Lipase: 47 U/L (ref 11–51)

## 2021-12-08 LAB — LACTIC ACID, PLASMA: Lactic Acid, Venous: 1.6 mmol/L (ref 0.5–1.9)

## 2021-12-08 LAB — CREATININE, URINE, RANDOM: Creatinine, Urine: 239.54 mg/dL

## 2021-12-08 LAB — MRSA NEXT GEN BY PCR, NASAL: MRSA by PCR Next Gen: NOT DETECTED

## 2021-12-08 LAB — SODIUM, URINE, RANDOM: Sodium, Ur: 13 mmol/L

## 2021-12-08 MED ORDER — SODIUM CHLORIDE 0.9 % IV BOLUS: 1000.0000 mL | Freq: Once | INTRAVENOUS | Status: DC

## 2021-12-08 MED ORDER — ORAL CARE MOUTH RINSE
15.0000 mL | Freq: Two times a day (BID) | OROMUCOSAL | Status: DC
  Administered 2021-12-08 – 2021-12-13 (×9): 15 mL via OROMUCOSAL

## 2021-12-08 MED ORDER — SODIUM CHLORIDE 0.9 % IV SOLN
250.0000 mL | INTRAVENOUS | Status: DC
  Administered 2021-12-12: 250 mL via INTRAVENOUS

## 2021-12-08 MED ORDER — DEXTROSE IN LACTATED RINGERS 5 % IV SOLN: INTRAVENOUS | Status: DC

## 2021-12-08 MED ORDER — SODIUM CHLORIDE 0.9 % IV SOLN: 10.0000 mL/h | Freq: Once | INTRAVENOUS | Status: DC

## 2021-12-08 MED ORDER — PANTOPRAZOLE 80MG IVPB - SIMPLE MED
80.0000 mg | Freq: Once | INTRAVENOUS | Status: AC
  Administered 2021-12-08: 80 mg via INTRAVENOUS
  Filled 2021-12-08: qty 80

## 2021-12-08 MED ORDER — DOCUSATE SODIUM 100 MG PO CAPS: 100.0000 mg | ORAL_CAPSULE | Freq: Two times a day (BID) | ORAL | Status: DC | PRN

## 2021-12-08 MED ORDER — ALBUTEROL SULFATE (2.5 MG/3ML) 0.083% IN NEBU: 2.5000 mg | INHALATION_SOLUTION | RESPIRATORY_TRACT | Status: DC | PRN

## 2021-12-08 MED ORDER — SODIUM CHLORIDE 0.9 % IV BOLUS
500.0000 mL | Freq: Once | INTRAVENOUS | Status: DC
Start: 2021-12-08 — End: 2021-12-08

## 2021-12-08 MED ORDER — FAMOTIDINE IN NACL 20-0.9 MG/50ML-% IV SOLN: 20.0000 mg | Freq: Two times a day (BID) | INTRAVENOUS | Status: DC

## 2021-12-08 MED ORDER — ONDANSETRON HCL 4 MG/2ML IJ SOLN: 4.0000 mg | Freq: Four times a day (QID) | INTRAMUSCULAR | Status: DC | PRN

## 2021-12-08 MED ORDER — SODIUM CHLORIDE 0.9 % IV BOLUS
1500.0000 mL | Freq: Once | INTRAVENOUS | Status: AC
  Administered 2021-12-08: 1500 mL via INTRAVENOUS

## 2021-12-08 MED ORDER — NOREPINEPHRINE 4 MG/250ML-% IV SOLN
2.0000 ug/min | INTRAVENOUS | Status: DC
  Administered 2021-12-08: 2 ug/min via INTRAVENOUS
  Administered 2021-12-09: 4 ug/min via INTRAVENOUS
  Filled 2021-12-08 (×2): qty 250

## 2021-12-08 MED ORDER — SODIUM CHLORIDE 0.9 % IV BOLUS
1000.0000 mL | Freq: Once | INTRAVENOUS | Status: AC
  Administered 2021-12-08: 1000 mL via INTRAVENOUS

## 2021-12-08 MED ORDER — NOREPINEPHRINE 4 MG/250ML-% IV SOLN: 2.0000 ug/min | INTRAVENOUS | Status: DC

## 2021-12-08 MED ORDER — CHLORHEXIDINE GLUCONATE CLOTH 2 % EX PADS
6.0000 | MEDICATED_PAD | Freq: Every day | CUTANEOUS | Status: DC
  Administered 2021-12-08 – 2021-12-13 (×6): 6 via TOPICAL

## 2021-12-08 MED ORDER — POLYETHYLENE GLYCOL 3350 17 G PO PACK: 17.0000 g | PACK | Freq: Every day | ORAL | Status: DC | PRN

## 2021-12-08 MED ORDER — SODIUM CHLORIDE 0.9 % IV SOLN
250.0000 mL | INTRAVENOUS | Status: DC
  Administered 2021-12-08 – 2021-12-10 (×3): 250 mL via INTRAVENOUS

## 2021-12-08 MED ORDER — SODIUM BICARBONATE 8.4 % IV SOLN
INTRAVENOUS | Status: DC
  Filled 2021-12-08 (×2): qty 150

## 2021-12-08 MED ORDER — PANTOPRAZOLE INFUSION (NEW) - SIMPLE MED
8.0000 mg/h | INTRAVENOUS | Status: AC
  Administered 2021-12-08 – 2021-12-10 (×6): 8 mg/h via INTRAVENOUS
  Filled 2021-12-08 (×2): qty 80
  Filled 2021-12-08 (×3): qty 100
  Filled 2021-12-08 (×4): qty 80

## 2021-12-08 MED ORDER — FAMOTIDINE IN NACL 20-0.9 MG/50ML-% IV SOLN
20.0000 mg | INTRAVENOUS | Status: DC
Start: 2021-12-08 — End: 2021-12-12
  Administered 2021-12-08 – 2021-12-11 (×4): 20 mg via INTRAVENOUS
  Filled 2021-12-08 (×4): qty 50

## 2021-12-08 MED ORDER — SODIUM CHLORIDE 0.9 % IV BOLUS
500.0000 mL | Freq: Once | INTRAVENOUS | Status: AC
  Administered 2021-12-08: 500 mL via INTRAVENOUS

## 2021-12-08 NOTE — Progress Notes (Signed)
eLink Physician-Brief Progress Note ?Patient Name: Joshua Curtis ?DOB: 1951-02-23 ?MRN: [id number] ? ? ?Date of Service ? 12/08/2021  ?HPI/Events of Note ? Running peripheral Levo into antecubital. IV watch alarming saying IV is bad. Alos has bicarb drip, protonix drip and about to received blood.  ?eICU Interventions ? TRH APP on floors and is able to help insert central line. Confirmed with bedside CCM team that APP can insert line. Order placed for CVC.  ? ? ? ?Intervention Category ?Intermediate Interventions: Other: ? ?Judd Lien ?12/08/2021, 11:54 PM ?

## 2021-12-08 NOTE — Progress Notes (Signed)
? ?LAb revneiw ? ?New: acidosis - per RN Started on bic gtt and on low dose levophed per RN. No active bleeding ? ?Continue to monirr ? ? ? ?SIGNATURE  ? ? ?Dr. Brand Males, M.D., F.C.C.P,  ?Pulmonary and Critical Care Medicine ?Staff Physician, [place] ?Center Director - Interstitial Lung Disease  Program  ?Medical Director - [facility] ?[facility] ?[place], [place], [address] ? ?NPI Number:  NPI #[id number] ?DEA Number: [id number] ? ?Pager: [phone], If no answer  -> Check AMION or Try [phone] ?Telephone (clinical office): [phone] ?Telephone (research): [phone] ? ?7:09 PM ?12/08/2021 ? ? ? ?LABS  ? ? ?PULMONARY ?Recent Labs  ?Lab 12/08/21 ?1615  ?PHART 7.26*  ?PCO2ART 22*  ?PO2ART 106  ?HCO3 9.9*  ?O2SAT 100  ? ? ?CBC ?Recent Labs  ?Lab 12/08/21 ?1018  ?HGB 6.3*  ?HCT 18.7*  ?WBC 7.8  ?PLT 184  ? ? ?COAGULATION ?Recent Labs  ?Lab 12/08/21 ?1050  ?INR 1.5*  ? ? ?CARDIAC ? No results for input(s): TROPONINI in the last 168 hours. ?No results for input(s): PROBNP in the last 168 hours. ? ? ?CHEMISTRY ?Recent Labs  ?Lab 12/08/21 ?1018  ?NA 139  ?K 4.7  ?CL 111  ?CO2 13*  ?GLUCOSE 155*  ?BUN 274*  ?CREATININE 15.70*  ?CALCIUM 8.2*  ?MG 2.2  ? ?Estimated Creatinine Clearance: 4.5 mL/min (A) (by C-G formula based on SCr of 15.7 mg/dL (H)). ? ? ?LIVER ?Recent Labs  ?Lab 12/08/21 ?1018 12/08/21 ?1050  ?AST 9*  --   ?ALT 13  --   ?ALKPHOS 30*  --   ?BILITOT 0.3  --   ?PROT 5.7*  --   ?ALBUMIN 3.0*  --   ?INR  --  1.5*  ? ? ? ?INFECTIOUS ?Recent Labs  ?Lab 12/08/21 ?1018  ?LATICACIDVEN 1.6  ? ? ? ?ENDOCRINE ?CBG (last 3)  ?No results for input(s): GLUCAP in the last 72 hours. ? ? ? ? ? ? ?IMAGING x48h  - image(s) personally visualized  -   highlighted in bold ?CT ABDOMEN PELVIS WO CONTRAST ? ?Result Date: 12/08/2021 ?CLINICAL DATA:  Abdominal pain, acute, nonlocalized EXAM: CT ABDOMEN AND PELVIS WITHOUT CONTRAST TECHNIQUE:  Multidetector CT imaging of the abdomen and pelvis was performed following the standard protocol without IV contrast. RADIATION DOSE REDUCTION: This exam was performed according to the departmental dose-optimization program which includes automated exposure control, adjustment of the mA and/or kV according to patient size and/or use of iterative reconstruction technique. COMPARISON:  CT dated December 05, 2016 FINDINGS: Evaluation is limited by lack of IV contrast. Lower chest: RIGHT lower lobe pulmonary nodule measures 5 by 6 mm, unchanged dating back to 2018 and consistent with a benign etiology (series 6, image 3). Hepatobiliary: Focal fatty deposition adjacent to the falciform ligament. Decompressed gallbladder. Otherwise unremarkable noncontrast appearance of the liver. Pancreas: Coarse calcification of the pancreatic body, nonspecific and new since 2019. Unchanged punctate calcification of the pancreatic tail since 2019 likely reflecting the sequela of prior pancreatitis. Spleen: Unremarkable. Adrenals/Urinary Tract: Adrenal glands are unremarkable. In the LEFT kidney, there is an intrinsically hyperdense mass which measures 18 mm with a Hounsfield unit of 60 (series 2, image 25). No hydronephrosis. LEFT-sided vascular calcifications. No obstructing nephrolithiasis. Bladder is decompressed. Stomach/Bowel: There is mild circumferential bowel wall thickening of the proximal duodenum. Wall prominence of the gastric antrum. No evidence of bowel obstruction. Scattered  colonic diverticulosis without evidence of acute diverticulitis. Appendix is normal. Vascular/Lymphatic: Severe vascular calcifications. Status post bypass stent graft placement in the RIGHT leg. No new lymphadenopathy identified. Reproductive: Status post prostatectomy. Other: No free air or free fluid. Musculoskeletal: Degenerative changes of bilateral hips. IMPRESSION: 1. Mild wall thickening of the gastric antrum and proximal duodenum. This is  nonspecific and could reflect a nonspecific gastritis/duodenitis in the appropriate clinical setting. 2. There is an 18 mm intrinsically hyperdense mass of the LEFT kidney. This is nonspecific and could reflect a hemorrhagic/proteinaceous cyst versus other lesion. Recommend further dedicated nonemergent evaluation with renal cyst protocol CT or MRI. Aortic Atherosclerosis (ICD10-I70.0). Electronically Signed   By: Valentino Saxon M.D.   On: 12/08/2021 12:24  ? ?US RENAL ? ?Result Date: 12/08/2021 ?CLINICAL DATA:  Follow-up from CT performed earlier today. Acute kidney injury. EXAM: RENAL / URINARY TRACT ULTRASOUND COMPLETE COMPARISON:  None. FINDINGS: Right Kidney: Renal measurements: 10.4 x 4.4 x 6.1 cm = volume: 148.2 mL. Echogenicity within normal limits. No mass or hydronephrosis visualized. Left Kidney: Renal measurements: 10.2 x 5.5 x 5.3 cm = volume: 154.9 mL. Normal parenchymal echogenicity. Hypo to anechoic mass seen in the medial kidney, midpole, 1.9 x 1.1 x 1.4 cm, consistent with the hyperattenuating lesion seen on CT. This is not well defined sonographically. No other left renal masses, no stones and no hydronephrosis. Bladder: Appears normal for degree of bladder distention. Other: None hypoechoic IMPRESSION: 1. 1.9 cm mass along the medial left kidney midpole consistent with the hyperattenuating lesion seen on CT. The combination of the hyperattenuation and the hypo to anechoic appearance on ultrasound strongly supports a Bosniak category 2, mildly complicated cyst. It is not, however, fully characterized on ultrasound. Consider follow-up assessment with repeat renal ultrasound in 4-6 months, versus earlier characterization, best performed with renal MRI without and with contrast. 2. No acute finding.  No hydronephrosis. Electronically Signed   By: Lajean Manes M.D.   On: 12/08/2021 13:58  ? ?DG Abd Portable 1 View ? ?Result Date: 12/08/2021 ?CLINICAL DATA:  NG placement confirmation EXAM: PORTABLE  ABDOMEN - 1 VIEW COMPARISON:  X-ray abdomen 12/08/2021 4:32 p.m. FINDINGS: Interval advancement of an enteric tube with tip and side port overlying the expected region of the gastric lumen. Likely Foley catheter temperature probe overlying the pelvis. Acute gaseous distension of the gastric lumen. The bowel gas pattern is normal. No radio-opaque calculi or other significant radiographic abnormality are seen. IMPRESSION: Enteric tube in good position. Electronically Signed   By: Iven Finn M.D.   On: 12/08/2021 18:07  ? ?DG Abd Portable 1 View ? ?Result Date: 12/08/2021 ?CLINICAL DATA:  Per chart: Pt BIBA from home. Pt c/o abd pain and loose stools for past week. N/V for 2X days.Pt stated they had a near syncopal episode and guided themselves to the ground. No pain EXAM: PORTABLE ABDOMEN - 1 VIEW COMPARISON:  CT, 12/08/2021 FINDINGS: Nasal/orogastric tube passes into the lower esophagus, tip projecting at the expected location of the gastroesophageal junction. Recommend further insertion of 10 cm to allow the tip and side hole to fully into the stomach. Stomach remains distended. IMPRESSION: 1. Nasal/orogastric tube tip projects at the gastroesophageal junction and will need to be further inserted, 10 cm recommended, to allow the tip and side hole to fully into the stomach. Electronically Signed   By: Lajean Manes M.D.   On: 12/08/2021 16:49   ? ?

## 2021-12-08 NOTE — Progress Notes (Signed)
Received VAST consult r/t PIV start for Vasopressor. Assessed bilateral forearms with ultrasound. No suitable veins found with u/s. Placed regular 20g PIV in right forearm. Primary RN Lynch notified. ?

## 2021-12-08 NOTE — Consult Note (Signed)
CONSULT NOTE FOR [facility] ? ?Reason for Consult:GI bleed ?Referring Physician: [facility] ? ?[name] ?HPI: This is a 71 year old male with a PMH of HTN, arthritis, COPD, atherosclerosis, PAH, PAD, and hyperlipidemia admitted with complaints of weakness, dizziness, nausea/vomiting, and abdominal pain.For the past several days he reports feeling unwell and his symptoms progressively worsened.  He went to the restroom today and he nearly had a syncopal episode.  Subsequently he experienced nausea and vomiting.  The gastric contents that he brought up were what he consumed that morning.  He did not notice any problems with coffee-grounds or hematemesis.  On a routine basis he uses meloxicam for his arthritis and this was used daily for the past several years.  The medication was recently stopped by his PCP when he was evaluated for complaints of epigastric pain.  In the ER he was found to be hypotensive and he was anemic.  His baseline HGB varies significantly, but the last value before this admission on 11/29/2021 was at 11.5 g/dL.  He denies seeing any melena or hematochezia.  His stool tested positive for blood.  Dr. Havery Moros performed a surveillance colonoscopy on 01/19/2020.  It was positive for some adenomas.  An EGD on 02/03/2018 showed a reactive gastropathy and the procedure was performed for IDA. ? ?Past Medical History:  ?Diagnosis Date  ? Anemia, iron deficiency 04/02/2016  ? Aortic atherosclerosis ([place])   ? Arthritis   ? rt knee  ? BPH (benign prostatic hyperplasia) 04/10/2017  ? Cancer [facility])   ? prostate  ? COLONIC POLYPS, HX OF 11/24/2007  ? COPD (chronic obstructive pulmonary disease) ([place])   ? pt denies this- no respiratory issues of any kind per pt  ? Coronary artery calcification seen on CAT scan   ? DIVERTICULOSIS, COLON 11/24/2007  ? ED (erectile dysfunction)   ? GERD (gastroesophageal reflux disease)   ? HLD (hyperlipidemia) 09/06/2020  ? HYPERTENSION 11/24/2007  ? Increased prostate  specific antigen (PSA) velocity 06/22/2011  ? PAH (pulmonary artery hypertension) ([place])   ? Peripheral vascular disease ([place])   ? ? ?Past Surgical History:  ?Procedure Laterality Date  ? ABDOMINAL AORTOGRAM W/LOWER EXTREMITY N/A 07/04/2020  ? Procedure: ABDOMINAL AORTOGRAM W/LOWER EXTREMITY;  Surgeon: Serafina Mitchell, MD;  Location: [facility];  Service: Cardiovascular;  Laterality: N/A;  ? ABDOMINAL AORTOGRAM W/LOWER EXTREMITY N/A 07/18/2020  ? Procedure: ABDOMINAL AORTOGRAM W/LOWER EXTREMITY;  Surgeon: Serafina Mitchell, MD;  Location: [facility];  Service: Cardiovascular;  Laterality: N/A;  ? ABDOMINAL AORTOGRAM W/LOWER EXTREMITY N/A 04/04/2021  ? Procedure: ABDOMINAL AORTOGRAM W/LOWER EXTREMITY;  Surgeon: Cherre Robins, MD;  Location: [facility];  Service: Cardiovascular;  Laterality: N/A;  ? BYPASS GRAFT FEMORAL-PERONEAL Right 04/13/2021  ? Procedure: RIGHT LEG FEMORAL-PERONEAL BYPASS USING PROPATEN GRAFT;  Surgeon: Serafina Mitchell, MD;  Location: [facility];  Service: Vascular;  Laterality: Right;  ? COLONOSCOPY    ? NO PAST SURGERIES    ? PATCH ANGIOPLASTY Right 04/13/2021  ? Procedure: ILIEO-FEMORAL USING VEIN PATCH ANGIOPLASTY;  Surgeon: Serafina Mitchell, MD;  Location[facility];  Service: Vascular;  Laterality: Right;  ? PERIPHERAL VASCULAR BALLOON ANGIOPLASTY Right 07/18/2020  ? Procedure: PERIPHERAL VASCULAR BALLOON ANGIOPLASTY;  Surgeon: Serafina Mitchell, MD;  Location: [facility];  Service: Cardiovascular;  Laterality: Right;  tibioperoneal trunk and posterior tibial  ? PERIPHERAL VASCULAR INTERVENTION  07/04/2020  ? Procedure: PERIPHERAL VASCULAR INTERVENTION;  Surgeon: Serafina Mitchell,  MD;  Location: [facility] [facility];  Service: Cardiovascular;;  Lt. SFA and Popliteal  ? PERIPHERAL VASCULAR INTERVENTION Right 07/18/2020  ? Procedure: PERIPHERAL VASCULAR INTERVENTION;  Surgeon: Serafina Mitchell, MD;  Location: [facility];  Service: Cardiovascular;  Laterality: Right;   Femoral Popliteal  ? ROBOT ASSISTED LAPAROSCOPIC RADICAL PROSTATECTOMY N/A 01/08/2021  ? Procedure: XI ROBOTIC ASSISTED LAPAROSCOPIC RADICAL PROSTATECTOMY LEVEL 3;  Surgeon: Raynelle Bring, MD;  Location: [facility];  Service: Urology;  Laterality: N/A;  ? ? ?Family History  ?Problem Relation Age of Onset  ? Stroke Mother   ? Diabetes Father   ? Hypertension Sister   ? Kidney failure Brother   ?     dialysis  ? Diabetes Paternal Grandfather   ? Anuerysm Brother   ? Colon cancer Neg Hx   ? Esophageal cancer Neg Hx   ? Rectal cancer Neg Hx   ? Stomach cancer Neg Hx   ? ? ?Social History:  reports that he quit smoking about 7 months ago. His smoking use included cigarettes. He smoked an average of .25 packs per day. He has never used smokeless tobacco. He reports that he does not currently use alcohol. He reports that he does not use drugs. ? ?Allergies: No Known Allergies ? ?Medications: Scheduled: ?Continuous: ? sodium chloride    ? sodium chloride    ? sodium chloride    ? norepinephrine (LEVOPHED) Adult infusion 3 mcg/min (12/08/21 1550)  ? pantoprazole Stopped (12/08/21 1537)  ? ? ?Results for orders placed or performed during the hospital encounter of 12/08/21 (from the past 24 hour(s))  ?POC occult blood, ED     Status: Abnormal  ? Collection Time: 12/08/21 10:05 AM  ?Result Value Ref Range  ? Fecal Occult Bld POSITIVE (A) NEGATIVE  ?Lipase, blood     Status: None  ? Collection Time: 12/08/21 10:18 AM  ?Result Value Ref Range  ? Lipase 47 11 - 51 U/L  ?Comprehensive metabolic panel     Status: Abnormal  ? Collection Time: 12/08/21 10:18 AM  ?Result Value Ref Range  ? Sodium 139 135 - 145 mmol/L  ? Potassium 4.7 3.5 - 5.1 mmol/L  ? Chloride 111 98 - 111 mmol/L  ? CO2 13 (L) 22 - 32 mmol/L  ? Glucose, Bld 155 (H) 70 - 99 mg/dL  ? BUN 274 (H) 8 - 23 mg/dL  ? Creatinine, Ser 15.70 (H) 0.61 - 1.24 mg/dL  ? Calcium 8.2 (L) 8.9 - 10.3 mg/dL  ? Total Protein 5.7 (L) 6.5 - 8.1 g/dL  ? Albumin 3.0 (L) 3.5 - 5.0 g/dL  ? AST 9  (L) 15 - 41 U/L  ? ALT 13 0 - 44 U/L  ? Alkaline Phosphatase 30 (L) 38 - 126 U/L  ? Total Bilirubin 0.3 0.3 - 1.2 mg/dL  ? GFR, Estimated 3 (L) >60 mL/min  ? Anion gap 15 5 - 15  ?CBC     Status: Abnormal  ? Collection Time: 12/08/21 10:18 AM  ?Result Value Ref Range  ? WBC 7.8 4.0 - 10.5 K/uL  ? RBC 2.13 (L) 4.22 - 5.81 MIL/uL  ? Hemoglobin 6.3 (LL) 13.0 - 17.0 g/dL  ? HCT 18.7 (L) 39.0 - 52.0 %  ? MCV 87.8 80.0 - 100.0 fL  ? MCH 29.6 26.0 - 34.0 pg  ? MCHC 33.7 30.0 - 36.0 g/dL  ? RDW 17.1 (H) 11.5 - 15.5 %  ? Platelets 184 150 - 400 K/uL  ?  nRBC 0.0 0.0 - 0.2 %  ?Magnesium     Status: None  ? Collection Time: 12/08/21 10:18 AM  ?Result Value Ref Range  ? Magnesium 2.2 1.7 - 2.4 mg/dL  ?Lactic acid, plasma     Status: None  ? Collection Time: 12/08/21 10:18 AM  ?Result Value Ref Range  ? Lactic Acid, Venous 1.6 0.5 - 1.9 mmol/L  ?Troponin I (High Sensitivity)     Status: None  ? Collection Time: 12/08/21 10:18 AM  ?Result Value Ref Range  ? Troponin I (High Sensitivity) 17 <18 ng/L  ?Type and screen     Status: None (Preliminary result)  ? Collection Time: 12/08/21 10:18 AM  ?Result Value Ref Range  ? ABO/RH(D) O POS   ? Antibody Screen NEG   ? Sample Expiration 12/11/2021,2359   ? Unit Number [id number]   ? Blood Component Type RED CELLS,LR   ? Unit division 00   ? Status of Unit ISSUED   ? Transfusion Status OK TO TRANSFUSE   ? Crossmatch Result    ?  Compatible ?Performed at [facility], [place] [address] [place], [address] [address] ?  ? Unit Number [id number]   ? Blood Component Type RED CELLS,LR   ? Unit division 00   ? Status of Unit ALLOCATED   ? Transfusion Status OK TO TRANSFUSE   ? Crossmatch Result Compatible   ? Unit Number [id number]   ? Blood Component Type RED CELLS,LR   ? Unit division 00   ? Status of Unit ALLOCATED   ? Transfusion Status OK TO TRANSFUSE   ? Crossmatch Result Compatible   ? Unit Number [id number]   ? Blood Component Type RED CELLS,LR   ? Unit division  00   ? Status of Unit ALLOCATED   ? Transfusion Status OK TO TRANSFUSE   ? Crossmatch Result Compatible   ?Protime-INR     Status: Abnormal  ? Collection Time: 12/08/21 10:50 AM  ?Result Value Ref Range  ? Prot

## 2021-12-08 NOTE — ED Provider Notes (Signed)
?[facility] DEPT ?Provider Note ? ? ?CSN: [id number] ?Arrival date & time: 12/08/21  0920 ? ?  ? ?History ? ?Chief Complaint  ?Patient presents with  ? Abdominal Pain  ? Nausea  ? Emesis  ? ? ?Joshua Curtis is a 71 y.o. male. ? ?Patient with history of hypertension, peripheral arterial disease --presents to the emergency department for lightheadedness abdominal pain.  Patient was seen by PCP on 11/29/2021.  He was on meloxicam which was discontinued at that time, started on Protonix and Carafate.  He began having some vomiting and worsening abdominal pain yesterday, with lightheaded episode this morning and vomiting.  Wife states that he may have had some dark stools over the past day.  Patient is not anticoagulated, only takes a baby aspirin daily.  Pain is generalized but worse in the upper abdomen.   ? ? ?  ? ?Home Medications ?Prior to Admission medications   ?Medication Sig Start Date End Date Taking? Authorizing Provider  ?amLODipine-benazepril (LOTREL) 10-40 MG capsule Take 1 capsule by mouth daily. 10/04/21   Biagio Borg, MD  ?aspirin EC 81 MG tablet Take 81 mg by mouth daily. Swallow whole.    [provider]  ?collagenase (SANTYL) ointment Apply 1 application topically daily as needed (toe wound).    [provider]  ?hydrochlorothiazide (MICROZIDE) 12.5 MG capsule Take 1 capsule (12.5 mg total) by mouth daily. 10/04/21   Biagio Borg, MD  ?pantoprazole (PROTONIX) 40 MG tablet Take 1 tablet (40 mg total) by mouth daily. 11/29/21   Biagio Borg, MD  ?povidone-iodine (BETADINE) 10 % ointment Apply 1 application topically as needed for wound care (toe wound).    [provider]  ?rosuvastatin (CRESTOR) 40 MG tablet Take 1 tablet (40 mg total) by mouth daily. 10/01/21 10/01/22  Dagoberto Ligas, PA-C  ?sucralfate (CARAFATE) 1 g tablet Take 1 tablet (1 g total) by mouth 4 (four) times daily. 11/29/21   Biagio Borg, MD  ?   ? ?Allergies    ?Patient has no  known allergies.   ? ?Review of Systems   ?Review of Systems ? ?Physical Exam ?Updated Vital Signs ?BP (!) 91/57   Pulse 88   Temp 97.7 ?F (36.5 ?C) (Oral)   Resp 16   SpO2 98%  ? ?Physical Exam ?Vitals and nursing note reviewed.  ?Constitutional:   ?   General: He is not in acute distress. ?   Appearance: He is well-developed.  ?HENT:  ?   Head: Normocephalic and atraumatic.  ?Eyes:  ?   General:     ?   Right eye: No discharge.     ?   Left eye: No discharge.  ?   Conjunctiva/sclera: Conjunctivae normal.  ?Cardiovascular:  ?   Rate and Rhythm: Normal rate and regular rhythm.  ?   Heart sounds: Normal heart sounds.  ?Pulmonary:  ?   Effort: Pulmonary effort is normal.  ?   Breath sounds: Normal breath sounds.  ?Abdominal:  ?   Palpations: Abdomen is soft.  ?   Tenderness: There is generalized abdominal tenderness and tenderness in the right upper quadrant, epigastric area and left upper quadrant.  ?Genitourinary: ?   Rectum: Guaiac result positive. No mass.  ?   Comments: Gross melanotic stool on exam ?Musculoskeletal:  ?   Cervical back: Normal range of motion and neck supple.  ?Skin: ?   General: Skin is warm and dry.  ?   Coloration:  Skin is pale.  ?Neurological:  ?   Mental Status: He is alert.  ? ? ?ED Results / Procedures / Treatments   ?Labs ?(all labs ordered are listed, but only abnormal results are displayed) ?Labs Reviewed  ?COMPREHENSIVE METABOLIC PANEL - Abnormal; Notable for the following components:  ?    Result Value  ? CO2 13 (*)   ? Glucose, Bld 155 (*)   ? BUN 274 (*)   ? Creatinine, Ser 15.70 (*)   ? Calcium 8.2 (*)   ? Total Protein 5.7 (*)   ? Albumin 3.0 (*)   ? AST 9 (*)   ? Alkaline Phosphatase 30 (*)   ? GFR, Estimated 3 (*)   ? All other components within normal limits  ?CBC - Abnormal; Notable for the following components:  ? RBC 2.13 (*)   ? Hemoglobin 6.3 (*)   ? HCT 18.7 (*)   ? RDW 17.1 (*)   ? All other components within normal limits  ?URINALYSIS, ROUTINE W REFLEX MICROSCOPIC -  Abnormal; Notable for the following components:  ? APPearance HAZY (*)   ? All other components within normal limits  ?PROTIME-INR - Abnormal; Notable for the following components:  ? Prothrombin Time 17.6 (*)   ? INR 1.5 (*)   ? All other components within normal limits  ?POC OCCULT BLOOD, ED - Abnormal; Notable for the following components:  ? Fecal Occult Bld POSITIVE (*)   ? All other components within normal limits  ?LIPASE, BLOOD  ?MAGNESIUM  ?LACTIC ACID, PLASMA  ?CREATININE, URINE, RANDOM  ?SODIUM, URINE, RANDOM  ?TYPE AND SCREEN  ?PREPARE RBC (CROSSMATCH)  ?TROPONIN I (HIGH SENSITIVITY)  ?TROPONIN I (HIGH SENSITIVITY)  ? ? ?ED ECG REPORT ? ? Date: 12/08/2021 ? Rate: 82 ? Rhythm: normal sinus rhythm ? QRS Axis: normal ? Intervals: normal ? ST/T Wave abnormalities: nonspecific T wave changes ? Conduction Disutrbances:none ? Narrative Interpretation:  ? Old EKG Reviewed: unchanged ? ?I have personally reviewed the EKG tracing and agree with the computerized printout as noted. ? ? ?Radiology ?CT ABDOMEN PELVIS WO CONTRAST ? ?Result Date: 12/08/2021 ?CLINICAL DATA:  Abdominal pain, acute, nonlocalized EXAM: CT ABDOMEN AND PELVIS WITHOUT CONTRAST TECHNIQUE: Multidetector CT imaging of the abdomen and pelvis was performed following the standard protocol without IV contrast. RADIATION DOSE REDUCTION: This exam was performed according to the departmental dose-optimization program which includes automated exposure control, adjustment of the mA and/or kV according to patient size and/or use of iterative reconstruction technique. COMPARISON:  CT dated December 05, 2016 FINDINGS: Evaluation is limited by lack of IV contrast. Lower chest: RIGHT lower lobe pulmonary nodule measures 5 by 6 mm, unchanged dating back to 2018 and consistent with a benign etiology (series 6, image 3). Hepatobiliary: Focal fatty deposition adjacent to the falciform ligament. Decompressed gallbladder. Otherwise unremarkable noncontrast appearance of  the liver. Pancreas: Coarse calcification of the pancreatic body, nonspecific and new since 2019. Unchanged punctate calcification of the pancreatic tail since 2019 likely reflecting the sequela of prior pancreatitis. Spleen: Unremarkable. Adrenals/Urinary Tract: Adrenal glands are unremarkable. In the LEFT kidney, there is an intrinsically hyperdense mass which measures 18 mm with a Hounsfield unit of 60 (series 2, image 25). No hydronephrosis. LEFT-sided vascular calcifications. No obstructing nephrolithiasis. Bladder is decompressed. Stomach/Bowel: There is mild circumferential bowel wall thickening of the proximal duodenum. Wall prominence of the gastric antrum. No evidence of bowel obstruction. Scattered colonic diverticulosis without evidence of acute diverticulitis. Appendix is normal. Vascular/Lymphatic: Severe  vascular calcifications. Status post bypass stent graft placement in the RIGHT leg. No new lymphadenopathy identified. Reproductive: Status post prostatectomy. Other: No free air or free fluid. Musculoskeletal: Degenerative changes of bilateral hips. IMPRESSION: 1. Mild wall thickening of the gastric antrum and proximal duodenum. This is nonspecific and could reflect a nonspecific gastritis/duodenitis in the appropriate clinical setting. 2. There is an 18 mm intrinsically hyperdense mass of the LEFT kidney. This is nonspecific and could reflect a hemorrhagic/proteinaceous cyst versus other lesion. Recommend further dedicated nonemergent evaluation with renal cyst protocol CT or MRI. Aortic Atherosclerosis (ICD10-I70.0). Electronically Signed   By: Valentino Saxon M.D.   On: 12/08/2021 12:24  ? ?US RENAL ? ?Result Date: 12/08/2021 ?CLINICAL DATA:  Follow-up from CT performed earlier today. Acute kidney injury. EXAM: RENAL / URINARY TRACT ULTRASOUND COMPLETE COMPARISON:  None. FINDINGS: Right Kidney: Renal measurements: 10.4 x 4.4 x 6.1 cm = volume: 148.2 mL. Echogenicity within normal limits. No mass  or hydronephrosis visualized. Left Kidney: Renal measurements: 10.2 x 5.5 x 5.3 cm = volume: 154.9 mL. Normal parenchymal echogenicity. Hypo to anechoic mass seen in the medial kidney, midpole, 1.9 x 1.1 x

## 2021-12-08 NOTE — ED Notes (Signed)
NG tube advanced 10cm per Rad rec. ?

## 2021-12-08 NOTE — Progress Notes (Signed)
An USGPIV (ultrasound guided PIV) has been placed for short-term vasopressor infusion. A correctly placed ivWatch must be used when administering Vasopressors. Should this treatment be needed beyond 72 hours, central line access should be obtained.  It will be the responsibility of the bedside nurse to follow best practice to prevent extravasations.   ?

## 2021-12-08 NOTE — ED Triage Notes (Signed)
Pt BIBA from home. Pt c/o abd pain and loose stools for past week. N/V for 2X days. ? ?Pt stated they had a near syncopal episode and guided themselves to the ground. No pain, trauma associated.  ? ?BP on EMS arrival was 70/40 ?Given 500 mL NS ? ?AOx4 ? ?BP: 82/62 ?Spo2: 98 RA ?CBG: 162 ? ?

## 2021-12-08 NOTE — H&P (View-Only) (Signed)
CONSULT NOTE FOR [facility] ? ?Reason for Consult:GI bleed ?Referring Physician: [facility] ? ?Joshua Curtis ?HPI: This is a 71 year old male with a PMH of HTN, arthritis, COPD, atherosclerosis, PAH, PAD, and hyperlipidemia admitted with complaints of weakness, dizziness, nausea/vomiting, and abdominal pain.For the past several days he reports feeling unwell and his symptoms progressively worsened.  He went to the restroom today and he nearly had a syncopal episode.  Subsequently he experienced nausea and vomiting.  The gastric contents that he brought up were what he consumed that morning.  He did not notice any problems with coffee-grounds or hematemesis.  On a routine basis he uses meloxicam for his arthritis and this was used daily for the past several years.  The medication was recently stopped by his PCP when he was evaluated for complaints of epigastric pain.  In the ER he was found to be hypotensive and he was anemic.  His baseline HGB varies significantly, but the last value before this admission on 11/29/2021 was at 11.5 g/dL.  He denies seeing any melena or hematochezia.  His stool tested positive for blood.  Dr. Havery Moros performed a surveillance colonoscopy on 01/19/2020.  It was positive for some adenomas.  An EGD on 02/03/2018 showed a reactive gastropathy and the procedure was performed for IDA. ? ?Past Medical History:  ?Diagnosis Date  ? Anemia, iron deficiency 04/02/2016  ? Aortic atherosclerosis ([place])   ? Arthritis   ? rt knee  ? BPH (benign prostatic hyperplasia) 04/10/2017  ? Cancer [facility])   ? prostate  ? COLONIC POLYPS, HX OF 11/24/2007  ? COPD (chronic obstructive pulmonary disease) ([place])   ? pt denies this- no respiratory issues of any kind per pt  ? Coronary artery calcification seen on CAT scan   ? DIVERTICULOSIS, COLON 11/24/2007  ? ED (erectile dysfunction)   ? GERD (gastroesophageal reflux disease)   ? HLD (hyperlipidemia) 09/06/2020  ? HYPERTENSION 11/24/2007  ? Increased prostate  specific antigen (PSA) velocity 06/22/2011  ? PAH (pulmonary artery hypertension) ([place])   ? Peripheral vascular disease ([place])   ? ? ?Past Surgical History:  ?Procedure Laterality Date  ? ABDOMINAL AORTOGRAM W/LOWER EXTREMITY N/A 07/04/2020  ? Procedure: ABDOMINAL AORTOGRAM W/LOWER EXTREMITY;  Surgeon: Serafina Mitchell, MD;  Location: [place] [facility];  Service: Cardiovascular;  Laterality: N/A;  ? ABDOMINAL AORTOGRAM W/LOWER EXTREMITY N/A 07/18/2020  ? Procedure: ABDOMINAL AORTOGRAM W/LOWER EXTREMITY;  Surgeon: Serafina Mitchell, MD;  Location: [place] [facility];  Service: Cardiovascular;  Laterality: N/A;  ? ABDOMINAL AORTOGRAM W/LOWER EXTREMITY N/A 04/04/2021  ? Procedure: ABDOMINAL AORTOGRAM W/LOWER EXTREMITY;  Surgeon: Cherre Robins, MD;  Location: [place] [facility];  Service: Cardiovascular;  Laterality: N/A;  ? BYPASS GRAFT FEMORAL-PERONEAL Right 04/13/2021  ? Procedure: RIGHT LEG FEMORAL-PERONEAL BYPASS USING PROPATEN GRAFT;  Surgeon: Serafina Mitchell, MD;  Location: [facility] OR;  Service: Vascular;  Laterality: Right;  ? COLONOSCOPY    ? NO PAST SURGERIES    ? PATCH ANGIOPLASTY Right 04/13/2021  ? Procedure: ILIEO-FEMORAL USING VEIN PATCH ANGIOPLASTY;  Surgeon: Serafina Mitchell, MD;  Location: [facility] OR;  Service: Vascular;  Laterality: Right;  ? PERIPHERAL VASCULAR BALLOON ANGIOPLASTY Right 07/18/2020  ? Procedure: PERIPHERAL VASCULAR BALLOON ANGIOPLASTY;  Surgeon: Serafina Mitchell, MD;  Location: [place] [facility];  Service: Cardiovascular;  Laterality: Right;  tibioperoneal trunk and posterior tibial  ? PERIPHERAL VASCULAR INTERVENTION  07/04/2020  ? Procedure: PERIPHERAL VASCULAR INTERVENTION;  Surgeon: Serafina Mitchell,  MD;  Location: [place] [facility];  Service: Cardiovascular;;  Lt. SFA and Popliteal  ? PERIPHERAL VASCULAR INTERVENTION Right 07/18/2020  ? Procedure: PERIPHERAL VASCULAR INTERVENTION;  Surgeon: Serafina Mitchell, MD;  Location: [place] [facility];  Service: Cardiovascular;  Laterality: Right;   Femoral Popliteal  ? ROBOT ASSISTED LAPAROSCOPIC RADICAL PROSTATECTOMY N/A 01/08/2021  ? Procedure: XI ROBOTIC ASSISTED LAPAROSCOPIC RADICAL PROSTATECTOMY LEVEL 3;  Surgeon: Raynelle Bring, MD;  Location: [facility] OR[facility];  Service: Urology;  Laterality: N/A;  ? ? ?Family History  ?Problem Relation Age of Onset  ? Stroke Mother   ? Diabetes Father   ? Hypertension Sister   ? Kidney failure Brother   ?     dialysis  ? Diabetes Paternal Grandfather   ? Anuerysm Brother   ? Colon cancer Neg Hx   ? Esophageal cancer Neg Hx   ? Rectal cancer Neg Hx   ? Stomach cancer Neg Hx   ? ? ?Social History:  reports that he quit smoking about 7 months ago. His smoking use included cigarettes. He smoked an average of .25 packs per day. He has never used smokeless tobacco. He reports that he does not currently use alcohol. He reports that he does not use drugs. ? ?Allergies: No Known Allergies ? ?Medications: Scheduled: ?Continuous: ? sodium chloride    ? sodium chloride    ? sodium chloride    ? norepinephrine (LEVOPHED) Adult infusion 3 mcg/min (12/08/21 1550)  ? pantoprazole Stopped (12/08/21 1537)  ? ? ?Results for orders placed or performed during the hospital encounter of 12/08/21 (from the past 24 hour(s))  ?POC occult blood, ED     Status: Abnormal  ? Collection Time: 12/08/21 10:05 AM  ?Result Value Ref Range  ? Fecal Occult Bld POSITIVE (A) NEGATIVE  ?Lipase, blood     Status: None  ? Collection Time: 12/08/21 10:18 AM  ?Result Value Ref Range  ? Lipase 47 11 - 51 U/L  ?Comprehensive metabolic panel     Status: Abnormal  ? Collection Time: 12/08/21 10:18 AM  ?Result Value Ref Range  ? Sodium 139 135 - 145 mmol/L  ? Potassium 4.7 3.5 - 5.1 mmol/L  ? Chloride 111 98 - 111 mmol/L  ? CO2 13 (L) 22 - 32 mmol/L  ? Glucose, Bld 155 (H) 70 - 99 mg/dL  ? BUN 274 (H) 8 - 23 mg/dL  ? Creatinine, Ser 15.70 (H) 0.61 - 1.24 mg/dL  ? Calcium 8.2 (L) 8.9 - 10.3 mg/dL  ? Total Protein 5.7 (L) 6.5 - 8.1 g/dL  ? Albumin 3.0 (L) 3.5 - 5.0 g/dL  ? AST 9  (L) 15 - 41 U/L  ? ALT 13 0 - 44 U/L  ? Alkaline Phosphatase 30 (L) 38 - 126 U/L  ? Total Bilirubin 0.3 0.3 - 1.2 mg/dL  ? GFR, Estimated 3 (L) >60 mL/min  ? Anion gap 15 5 - 15  ?CBC     Status: Abnormal  ? Collection Time: 12/08/21 10:18 AM  ?Result Value Ref Range  ? WBC 7.8 4.0 - 10.5 K/uL  ? RBC 2.13 (L) 4.22 - 5.81 MIL/uL  ? Hemoglobin 6.3 (LL) 13.0 - 17.0 g/dL  ? HCT 18.7 (L) 39.0 - 52.0 %  ? MCV 87.8 80.0 - 100.0 fL  ? MCH 29.6 26.0 - 34.0 pg  ? MCHC 33.7 30.0 - 36.0 g/dL  ? RDW 17.1 (H) 11.5 - 15.5 %  ? Platelets 184 150 - 400 K/uL  ?  nRBC 0.0 0.0 - 0.2 %  ?Magnesium     Status: None  ? Collection Time: 12/08/21 10:18 AM  ?Result Value Ref Range  ? Magnesium 2.2 1.7 - 2.4 mg/dL  ?Lactic acid, plasma     Status: None  ? Collection Time: 12/08/21 10:18 AM  ?Result Value Ref Range  ? Lactic Acid, Venous 1.6 0.5 - 1.9 mmol/L  ?Troponin I (High Sensitivity)     Status: None  ? Collection Time: 12/08/21 10:18 AM  ?Result Value Ref Range  ? Troponin I (High Sensitivity) 17 <18 ng/L  ?Type and screen     Status: None (Preliminary result)  ? Collection Time: 12/08/21 10:18 AM  ?Result Value Ref Range  ? ABO/RH(D) O POS   ? Antibody Screen NEG   ? Sample Expiration 12/11/2021,2359   ? Unit Number [id number]   ? Blood Component Type RED CELLS,LR   ? Unit division 00   ? Status of Unit ISSUED   ? Transfusion Status OK TO TRANSFUSE   ? Crossmatch Result    ?  Compatible ?Performed at [facility], [place] [address], [place], [place] [address] ?  ? Unit Number [id number]   ? Blood Component Type RED CELLS,LR   ? Unit division 00   ? Status of Unit ALLOCATED   ? Transfusion Status OK TO TRANSFUSE   ? Crossmatch Result Compatible   ? Unit Number [id number]   ? Blood Component Type RED CELLS,LR   ? Unit division 00   ? Status of Unit ALLOCATED   ? Transfusion Status OK TO TRANSFUSE   ? Crossmatch Result Compatible   ? Unit Number [id number]   ? Blood Component Type RED CELLS,LR   ? Unit division  00   ? Status of Unit ALLOCATED   ? Transfusion Status OK TO TRANSFUSE   ? Crossmatch Result Compatible   ?Protime-INR     Status: Abnormal  ? Collection Time: 12/08/21 10:50 AM  ?Result Value Ref Range  ? Prot

## 2021-12-08 NOTE — H&P (Signed)
? ?NAME:  Joshua Curtis, MRN:  [id number], DOB:  Apr 09, 1951, LOS: 0 ?ADMISSION DATE:  12/08/2021, CONSULTATION DATE:  12/08/2021 ?REFERRING MD:  Dr Dorie Rank, CHIEF COMPLAINT:  GI bleed , AKI and shock ?Primary care physician Dr. Cathlean Cower ? ?History of Present Illness:  ? ?71 year old male with a past medical history of hypertension on baby aspirin ACE inhibitor and hydrochlorothiazide, COPD not otherwise specified atherosclerosis peripheral arterial disease hyperlipidemia.  And also incidental bicuspid arctic valve June 2022.  Saw primary care physician on 11/29/2021 with 4 days of epigastric pain using NSAIDs [does not use alcohol].  Associated weight loss was observed of 14 pounds [168 pounds February 2023 > 154 pounds 11/29/2021] with a resultant BMI of 22.1 and cachexia.  Primary care physician stopped his Mobic and added Protonix and Carafate and urgent referral to GI with CT abdomen.  His creatinine the following day was 1.65 mg percent with mild acute kidney injury [baseline 1.1-1.2 mg percent August 2022] ? ?Then on 12/07/2021 started having some abdominal pain worsening and also some vomiting without any blood.  On 12/09/2018 3-day of admission he had dizziness and also black tarry stools and worsening abdominal pain and present to the ER ? ?In the ER creatinine much worse 15 mg percent bicarb low but potassium fine.  Hemoglobin 6 g% [normal hemoglobin 1 week earlier].  Hypotensive.  At the time of CCM evaluation had received 2.5 L of fluids and 1 unit of packed red blood cells and mean arterial pressure of 58.  He said he had made urine in the ER. ? ?Renal and GI consulted. ? ? ?Past Medical History:  ? ? has a past medical history of Anemia, iron deficiency (04/02/2016), Aortic atherosclerosis ([place]), Arthritis, BPH (benign prostatic hyperplasia) (04/10/2017), Cancer ([place]), COLONIC POLYPS, HX OF (11/24/2007), COPD (chronic obstructive pulmonary disease) ([place]), Coronary artery calcification seen on CAT scan,  DIVERTICULOSIS, COLON (11/24/2007), ED (erectile dysfunction), GERD (gastroesophageal reflux disease), HLD (hyperlipidemia) (09/06/2020), HYPERTENSION (11/24/2007), Increased prostate specific antigen (PSA) velocity (06/22/2011), PAH (pulmonary artery hypertension) ([place]), and Peripheral vascular disease ([place]). ? ? reports that he quit smoking about 7 months ago. His smoking use included cigarettes. He smoked an average of .25 packs per day. He has never used smokeless tobacco. ? ?Past Surgical History:  ?Procedure Laterality Date  ? ABDOMINAL AORTOGRAM W/LOWER EXTREMITY N/A 07/04/2020  ? Procedure: ABDOMINAL AORTOGRAM W/LOWER EXTREMITY;  Surgeon: Serafina Mitchell, MD;  Location: [place] [facility];  Service: Cardiovascular;  Laterality: N/A;  ? ABDOMINAL AORTOGRAM W/LOWER EXTREMITY N/A 07/18/2020  ? Procedure: ABDOMINAL AORTOGRAM W/LOWER EXTREMITY;  Surgeon: Serafina Mitchell, MD;  Location: [place] [facility];  Service: Cardiovascular;  Laterality: N/A;  ? ABDOMINAL AORTOGRAM W/LOWER EXTREMITY N/A 04/04/2021  ? Procedure: ABDOMINAL AORTOGRAM W/LOWER EXTREMITY;  Surgeon: Cherre Robins, MD;  Location: [place] [facility];  Service: Cardiovascular;  Laterality: N/A;  ? BYPASS GRAFT FEMORAL-PERONEAL Right 04/13/2021  ? Procedure: RIGHT LEG FEMORAL-PERONEAL BYPASS USING PROPATEN GRAFT;  Surgeon: Serafina Mitchell, MD;  Location[facility];  Service: Vascular;  Laterality: Right;  ? COLONOSCOPY    ? NO PAST SURGERIES    ? PATCH ANGIOPLASTY Right 04/13/2021  ? Procedure: ILIEO-FEMORAL USING VEIN PATCH ANGIOPLASTY;  Surgeon: Serafina Mitchell, MD;  Location: [facility];  Service: Vascular;  Laterality: Right;  ? PERIPHERAL VASCULAR BALLOON ANGIOPLASTY Right 07/18/2020  ? Procedure: PERIPHERAL VASCULAR BALLOON ANGIOPLASTY;  Surgeon: Serafina Mitchell, MD;  Location: [place] [facility];  Service: Cardiovascular;  Laterality: Right;  tibioperoneal trunk and posterior tibial  ? PERIPHERAL VASCULAR INTERVENTION  07/04/2020  ? Procedure: PERIPHERAL  VASCULAR INTERVENTION;  Surgeon: Serafina Mitchell, MD;  Location: [place] [facility];  Service: Cardiovascular;;  Lt. SFA and Popliteal  ? PERIPHERAL VASCULAR INTERVENTION Right 07/18/2020  ? Procedure: PERIPHERAL VASCULAR INTERVENTION;  Surgeon: Serafina Mitchell, MD;  Location: [place] [facility];  Service: Cardiovascular;  Laterality: Right;  Femoral Popliteal  ? ROBOT ASSISTED LAPAROSCOPIC RADICAL PROSTATECTOMY N/A 01/08/2021  ? Procedure: XI ROBOTIC ASSISTED LAPAROSCOPIC RADICAL PROSTATECTOMY LEVEL 3;  Surgeon: Raynelle Bring, MD;  Location: [facility];  Service: Urology;  Laterality: N/A;  ? ? ?No Known Allergies ? ?Immunization History  ?Administered Date(s) Administered  ? Fluad Quad(high Dose 65+) 04/21/2019, 09/06/2020  ? Influenza Split 06/21/2011  ? Influenza, High Dose Seasonal PF 10/03/2015, 10/16/2018, 06/22/2021  ? Influenza, Seasonal, Injecte, Preservative Fre 09/25/2012  ? Influenza,inj,Quad PF,6+ Mos 09/28/2013, 09/29/2014  ? PFIZER(Purple Top)SARS-COV-2 Vaccination 11/13/2019, 12/08/2019, 07/28/2020  ? Pension scheme manager 104yr & up 06/22/2021  ? Pneumococcal Conjugate-13 10/16/2018  ? Pneumococcal Polysaccharide-23 03/07/2021  ? Td 03/14/2010  ? Tdap 09/06/2020  ? ? ?Family History  ?Problem Relation Age of Onset  ? Stroke Mother   ? Diabetes Father   ? Hypertension Sister   ? Kidney failure Brother   ?     dialysis  ? Diabetes Paternal Grandfather   ? Anuerysm Brother   ? Colon cancer Neg Hx   ? Esophageal cancer Neg Hx   ? Rectal cancer Neg Hx   ? Stomach cancer Neg Hx   ? ? ? ?Current Facility-Administered Medications:  ?  0.9 %  sodium chloride infusion, 10 mL/hr, Intravenous, Once, Geiple, Joshua, PA-C ?  pantoprozole (PROTONIX) 80 mg /NS 100 mL infusion, 8 mg/hr, Intravenous, Continuous, Geiple, Joshua, PA-C, Last Rate: 10 mL/hr at 12/08/21 1216, 8 mg/hr at 12/08/21 1216 ?  sodium chloride 0.9 % bolus 500 mL, 500 mL, Intravenous, Once, GCarlisle Cater PA-C ? ?Current  Outpatient Medications:  ?  amLODipine-benazepril (LOTREL) 10-40 MG capsule, Take 1 capsule by mouth daily. (Patient taking differently: Take 1 capsule by mouth every morning.), Disp: 90 capsule, Rfl: 3 ?  aspirin EC 81 MG tablet, Take 81 mg by mouth every morning. Swallow whole., Disp: , Rfl:  ?  hydrochlorothiazide (MICROZIDE) 12.5 MG capsule, Take 1 capsule (12.5 mg total) by mouth daily. (Patient taking differently: Take 12.5 mg by mouth every morning.), Disp: 90 capsule, Rfl: 3 ?  rosuvastatin (CRESTOR) 40 MG tablet, Take 1 tablet (40 mg total) by mouth daily. (Patient taking differently: Take 40 mg by mouth every morning.), Disp: 90 tablet, Rfl: 3 ?  sucralfate (CARAFATE) 1 g tablet, Take 1 tablet (1 g total) by mouth 4 (four) times daily., Disp: 120 tablet, Rfl: 0 ?  pantoprazole (PROTONIX) 40 MG tablet, Take 1 tablet (40 mg total) by mouth daily. (Patient not taking: Reported on 12/08/2021), Disp: 90 tablet, Rfl: 3 ? ? ? ? ?Significant Hospital Events:  ?12/08/2021 - admit ? ? ?Interim History / Subjective:  ? ?12/08/2021 - seen in [facility] bed 20 ? ?Objective   ?Blood pressure (!) 88/49, pulse 70, temperature 97.6 ?F (36.4 ?C), temperature source Oral, resp. rate 14, SpO2 98 %. ?   ?   ? ?Intake/Output Summary (Last 24 hours) at 12/08/2021 1432 ?Last data filed at 12/08/2021 1246 ?Gross per 24 hour  ?Intake 1600 ml  ?Output --  ?Net 1600 ml  ? ?  There were no vitals filed for this visit. ? ?Examination: ?General: Thin male lying in bed.  Has blanket over him.  Cachectic.  Has muscle wasting. ?HENT: Supple neck no elevated JVP no neck nodes. ?Lungs: No distress.  Clear to auscultation bilaterally. ?Cardiovascular: Regular rate and rhythm no murmurs.  Hypotensive ?Abdomen: Soft but does have lower abdominal tenderness but no rebound rigidity or guarding ?Extremities: No cyanosis no clubbing no edema ?Neuro: Alert and oriented x3 ?GU: Not examined ? ?Resolved Hospital Problem list   ?X ? ?Assessment & Plan:   ? ? ? ?VASCULAR ?A:   ?Circulatory shock secondary to GI bleed upper ? ?- 12/08/2021: Mean artery pressure of 58 after 1 unit of blood and 2.5 L of fluid ? ?P:  ?Levophed for mean artery pressure greater than 65 ?[facility]

## 2021-12-08 NOTE — Consult Note (Signed)
Renal Service ?Consult Note ?Joshua Curtis ? ?Joshua Curtis ?12/08/2021 ?Sol Blazing, MD ?Requesting Physician: Dr. Tomi Bamberger ? ?Reason for Consult: Renal failure ?HPI: The patient is a 71 y.o. year-old w/ hx of anemia, DJD, BPH sp TURP, prostate cancer, COPD, HL, HTN, PAD w/ R fem-peroneal bypass, PAH who presented w/ abd pain and loose stools for a week. Also N/V for two days. Had near syncopal episode so called EMS.  BP 70/40 on arrival per EMS, given 500 NS. In ED Hb was 6.3, plts wnl. WBC 7K. BUN 270 and creat 15.  Creat was 13.8 on office visit to PCP labs from 3/30 a week ago. Prior to that creat was 1.65 on Sep 07, 2021.  ? ?Pt seen in room, pt is very fatigued and vague historian. Hx obtained from chart and family members.  Main issue nausea, some emesis, no diarrhea, poor po intake, feeling poor for 2-3 weeks. Hx R leg bypass surgical, hx prostate "removal", HTN. No DM. No swelling, poor appetite.  ? ?ROS - denies CP, no joint pain, no HA, no blurry vision, no rash, no diarrhea, no nausea/ vomiting, no dysuria, no difficulty voiding ? ? ?Past Medical History  ?Past Medical History:  ?Diagnosis Date  ? Anemia, iron deficiency 04/02/2016  ? Aortic atherosclerosis ([place])   ? Arthritis   ? rt knee  ? BPH (benign prostatic hyperplasia) 04/10/2017  ? Cancer [place])   ? prostate  ? COLONIC POLYPS, HX OF 11/24/2007  ? COPD (chronic obstructive pulmonary disease) ([place])   ? pt denies this- no respiratory issues of any kind per pt  ? Coronary artery calcification seen on CAT scan   ? DIVERTICULOSIS, COLON 11/24/2007  ? ED (erectile dysfunction)   ? GERD (gastroesophageal reflux disease)   ? HLD (hyperlipidemia) 09/06/2020  ? HYPERTENSION 11/24/2007  ? Increased prostate specific antigen (PSA) velocity 06/22/2011  ? PAH (pulmonary artery hypertension) ([place])   ? Peripheral vascular disease ([place])   ? ?Past Surgical History  ?Past Surgical History:  ?Procedure Laterality Date  ? ABDOMINAL AORTOGRAM W/LOWER EXTREMITY  N/A 07/04/2020  ? Procedure: ABDOMINAL AORTOGRAM W/LOWER EXTREMITY;  Surgeon: Serafina Mitchell, MD;  Location: [place] [facility];  Service: Cardiovascular;  Laterality: N/A;  ? ABDOMINAL AORTOGRAM W/LOWER EXTREMITY N/A 07/18/2020  ? Procedure: ABDOMINAL AORTOGRAM W/LOWER EXTREMITY;  Surgeon: Serafina Mitchell, MD;  Location: [place] [facility];  Service: Cardiovascular;  Laterality: N/A;  ? ABDOMINAL AORTOGRAM W/LOWER EXTREMITY N/A 04/04/2021  ? Procedure: ABDOMINAL AORTOGRAM W/LOWER EXTREMITY;  Surgeon: Cherre Robins, MD;  Location: [place] [facility];  Service: Cardiovascular;  Laterality: N/A;  ? BYPASS GRAFT FEMORAL-PERONEAL Right 04/13/2021  ? Procedure: RIGHT LEG FEMORAL-PERONEAL BYPASS USING PROPATEN GRAFT;  Surgeon: Serafina Mitchell, MD;  Location[facility];  Service: Vascular;  Laterality: Right;  ? COLONOSCOPY    ? NO PAST SURGERIES    ? PATCH ANGIOPLASTY Right 04/13/2021  ? Procedure: ILIEO-FEMORAL USING VEIN PATCH ANGIOPLASTY;  Surgeon: Serafina Mitchell, MD;  Location[facility];  Service: Vascular;  Laterality: Right;  ? PERIPHERAL VASCULAR BALLOON ANGIOPLASTY Right 07/18/2020  ? Procedure: PERIPHERAL VASCULAR BALLOON ANGIOPLASTY;  Surgeon: Serafina Mitchell, MD;  Location: [place] [facility];  Service: Cardiovascular;  Laterality: Right;  tibioperoneal trunk and posterior tibial  ? PERIPHERAL VASCULAR INTERVENTION  07/04/2020  ? Procedure: PERIPHERAL VASCULAR INTERVENTION;  Surgeon: Serafina Mitchell, MD;  Location: [place] [facility];  Service: Cardiovascular;;  Lt. SFA and Popliteal  ?  PERIPHERAL VASCULAR INTERVENTION Right 07/18/2020  ? Procedure: PERIPHERAL VASCULAR INTERVENTION;  Surgeon: Serafina Mitchell, MD;  Location: [place] [facility];  Service: Cardiovascular;  Laterality: Right;  Femoral Popliteal  ? ROBOT ASSISTED LAPAROSCOPIC RADICAL PROSTATECTOMY N/A 01/08/2021  ? Procedure: XI ROBOTIC ASSISTED LAPAROSCOPIC RADICAL PROSTATECTOMY LEVEL 3;  Surgeon: Raynelle Bring, MD;  Location: [facility];  Service:  Urology;  Laterality: N/A;  ? ?Family History  ?Family History  ?Problem Relation Age of Onset  ? Stroke Mother   ? Diabetes Father   ? Hypertension Sister   ? Kidney failure Brother   ?     dialysis  ? Diabetes Paternal Grandfather   ? Anuerysm Brother   ? Colon cancer Neg Hx   ? Esophageal cancer Neg Hx   ? Rectal cancer Neg Hx   ? Stomach cancer Neg Hx   ? ?Social History  reports that he quit smoking about 7 months ago. His smoking use included cigarettes. He smoked an average of .25 packs per day. He has never used smokeless tobacco. He reports that he does not currently use alcohol. He reports that he does not use drugs. ?Allergies No Known Allergies ?Home medications ?Prior to Admission medications   ?Medication Sig Start Date End Date Taking? Authorizing Provider  ?amLODipine-benazepril (LOTREL) 10-40 MG capsule Take 1 capsule by mouth daily. 10/04/21   Biagio Borg, MD  ?aspirin EC 81 MG tablet Take 81 mg by mouth daily. Swallow whole.    [provider]  ?collagenase (SANTYL) ointment Apply 1 application topically daily as needed (toe wound).    [provider]  ?hydrochlorothiazide (MICROZIDE) 12.5 MG capsule Take 1 capsule (12.5 mg total) by mouth daily. 10/04/21   Biagio Borg, MD  ?pantoprazole (PROTONIX) 40 MG tablet Take 1 tablet (40 mg total) by mouth daily. 11/29/21   Biagio Borg, MD  ?povidone-iodine (BETADINE) 10 % ointment Apply 1 application topically as needed for wound care (toe wound).    [provider]  ?rosuvastatin (CRESTOR) 40 MG tablet Take 1 tablet (40 mg total) by mouth daily. 10/01/21 10/01/22  Dagoberto Ligas, PA-C  ?sucralfate (CARAFATE) 1 g tablet Take 1 tablet (1 g total) by mouth 4 (four) times daily. 11/29/21   Biagio Borg, MD  ? ? ? ?Vitals:  ? 12/08/21 1130 12/08/21 1241 12/08/21 1257 12/08/21 1315  ?BP: (!) 77/54 (!) 81/47 (!) 81/45 (!) 86/52  ?Pulse:  74 74   ?Resp: _0 ?Temp:  97.9 ?F (36.6 ?C) 97.6 ?F (36.4 ?C)   ?TempSrc:  Oral Oral    ?SpO2:  100% 95%   ? ?Exam ?Gen fatigued appearing older AAM ?No rash, cyanosis or gangrene ?Sclera anicteric, throat clear and moist ?No jvd or bruits, flat neck veins ?Chest clear bilat to bases, no rales/ wheezing ?RRR no RG ?Abd soft ntnd no mass or ascites +bs, no SP bulging ?GU normal male ?MS no joint effusions or deformity ?Ext no LE or UE edema, no wounds or ulcers ?Neuro is alert, Ox 3, gen weakness ?   ? ? Home meds include - amlodipine/ benazepril 10-40 qd, asa, hctz 12.5 qd, protonix, crestor, carafate, prns/ vits/ supps ? ? ?   Renal US 4/08 - FINDINGS: R kidney 10.4 cm, normal echo, no hydro or mass. L kidney 10.2 cm, normal echo and no hydro. Anechoic mass in medial L kidney, consistent w/ the lesion seen on CT. No stones. Bladder: Appears normal for degree of  bladder distention. ? ?   UA neg protein, neg ket, neg Hb, neg LE / nitrite, 1.013 ?   Na 135  CO2 14  BUN 274  Creat 15.70  Ca 8.2   Alb 3.0 TProt 5.7  egfr 3 ?    WBC 7.8   Hb 6.3  MCV 88  plt 184 ?    Baseline creat 1.65 from Sep 07, 2021, eGFR 42 ml/min ?    Last creat on 11/29/21 (10 d ago) was 13.85.  ?   +has been taking nsaids for epigastric pain ? ? ?Assessment/ Plan: ?Renal failure - severe AKI in pt w/ marked anemia, N/V and poor po intake, on ACEi and taking nsaids for stomach pains. No markers for myeloma (Tprot and Ca not high) except low Hb. UA is completely normal, renal US w/o obstruction, normal size and echo. Urine lytes c/w vol depletion. Suspect AKI due to vol depletion+ ACEi and nsaids, hypotension. Recommend another 1.5 L bolus (sp 3 L bolus) and agree w/ bicarb gtt at 100 cc/hr for metabolic acidosis. Place foley. Will send myeloma labs. Will follow.  ?Anemia - per pmd, prbc's ordered.  ?HTN - hold ACEi for AKI and hold hctz for vol depletion.  ?COPD ?PAD ?  ? ? ? ?Kelly Splinter  MD ?12/08/2021, 2:00 PM ?Recent Labs  ?Lab 12/08/21 ?1018  ?HGB 6.3*  ?ALBUMIN 3.0*  ?CALCIUM 8.2*  ?CREATININE 15.70*  ?K 4.7  ? ? ?

## 2021-12-09 ENCOUNTER — Inpatient Hospital Stay (HOSPITAL_COMMUNITY): Payer: PPO

## 2021-12-09 ENCOUNTER — Inpatient Hospital Stay (HOSPITAL_COMMUNITY): Payer: PPO | Admitting: Anesthesiology

## 2021-12-09 ENCOUNTER — Encounter (HOSPITAL_COMMUNITY)
Admission: EM | Disposition: A | Discharge status: Home / Self Care | Payer: Self-pay | Source: Home / Self Care | Attending: Pulmonary Disease

## 2021-12-09 DIAGNOSIS — R579 Shock, unspecified: Secondary | ICD-10-CM | POA: Diagnosis not present

## 2021-12-09 DIAGNOSIS — I9589 Other hypotension: Secondary | ICD-10-CM

## 2021-12-09 DIAGNOSIS — R578 Other shock: Secondary | ICD-10-CM | POA: Diagnosis not present

## 2021-12-09 DIAGNOSIS — K31811 Angiodysplasia of stomach and duodenum with bleeding: Secondary | ICD-10-CM

## 2021-12-09 DIAGNOSIS — K2971 Gastritis, unspecified, with bleeding: Secondary | ICD-10-CM

## 2021-12-09 DIAGNOSIS — D509 Iron deficiency anemia, unspecified: Secondary | ICD-10-CM

## 2021-12-09 DIAGNOSIS — J449 Chronic obstructive pulmonary disease, unspecified: Secondary | ICD-10-CM

## 2021-12-09 HISTORY — PX: SCLEROTHERAPY: SHX6841

## 2021-12-09 HISTORY — PX: ESOPHAGOGASTRODUODENOSCOPY (EGD) WITH PROPOFOL: SHX5813

## 2021-12-09 HISTORY — PX: HOT HEMOSTASIS: SHX5433

## 2021-12-09 LAB — CBC WITH DIFFERENTIAL/PLATELET
Abs Immature Granulocytes: 0.05 10*3/uL (ref 0.00–0.07)
Abs Immature Granulocytes: 0.04 10*3/uL (ref 0.00–0.07)
Basophils Absolute: 0 10*3/uL (ref 0.0–0.1)
Basophils Absolute: 0 10*3/uL (ref 0.0–0.1)
Basophils Relative: 0 %
Basophils Relative: 0 %
Eosinophils Absolute: 0.3 10*3/uL (ref 0.0–0.5)
Eosinophils Absolute: 0.4 10*3/uL (ref 0.0–0.5)
Eosinophils Relative: 2 %
Eosinophils Relative: 4 %
HCT: 19.4 % — ABNORMAL LOW (ref 39.0–52.0)
HCT: 22.8 % — ABNORMAL LOW (ref 39.0–52.0)
Hemoglobin: 6.8 g/dL — CL (ref 13.0–17.0)
Hemoglobin: 8 g/dL — ABNORMAL LOW (ref 13.0–17.0)
Immature Granulocytes: 0 %
Immature Granulocytes: 0 %
Lymphocytes Relative: 6 %
Lymphocytes Relative: 7 %
Lymphs Abs: 0.8 10*3/uL (ref 0.7–4.0)
Lymphs Abs: 0.7 10*3/uL (ref 0.7–4.0)
MCH: 31.6 pg (ref 26.0–34.0)
MCH: 31.3 pg (ref 26.0–34.0)
MCHC: 35.1 g/dL (ref 30.0–36.0)
MCHC: 35.1 g/dL (ref 30.0–36.0)
MCV: 90.2 fL (ref 80.0–100.0)
MCV: 89.1 fL (ref 80.0–100.0)
Monocytes Absolute: 0.6 10*3/uL (ref 0.1–1.0)
Monocytes Absolute: 0.6 10*3/uL (ref 0.1–1.0)
Monocytes Relative: 5 %
Monocytes Relative: 6 %
Neutro Abs: 10.9 10*3/uL — ABNORMAL HIGH (ref 1.7–7.7)
Neutro Abs: 8.3 10*3/uL — ABNORMAL HIGH (ref 1.7–7.7)
Neutrophils Relative %: 87 %
Neutrophils Relative %: 83 %
Platelets: 99 10*3/uL — ABNORMAL LOW (ref 150–400)
Platelets: 103 10*3/uL — ABNORMAL LOW (ref 150–400)
RBC: 2.15 MIL/uL — ABNORMAL LOW (ref 4.22–5.81)
RBC: 2.56 MIL/uL — ABNORMAL LOW (ref 4.22–5.81)
RDW: 16.3 % — ABNORMAL HIGH (ref 11.5–15.5)
RDW: 15.7 % — ABNORMAL HIGH (ref 11.5–15.5)
WBC: 12.8 10*3/uL — ABNORMAL HIGH (ref 4.0–10.5)
WBC: 10.1 10*3/uL (ref 4.0–10.5)
nRBC: 0 % (ref 0.0–0.2)
nRBC: 0 % (ref 0.0–0.2)

## 2021-12-09 LAB — BLOOD GAS, ARTERIAL
Acid-base deficit: 9.9 mmol/L — ABNORMAL HIGH (ref 0.0–2.0)
Bicarbonate: 14.7 mmol/L — ABNORMAL LOW (ref 20.0–28.0)
O2 Saturation: 98.8 %
Patient temperature: 37
pCO2 arterial: 26 mmHg — ABNORMAL LOW (ref 32–48)
pH, Arterial: 7.36 (ref 7.35–7.45)
pO2, Arterial: 95 mmHg (ref 83–108)

## 2021-12-09 LAB — ECHOCARDIOGRAM COMPLETE
AR max vel: 3.07 cm2
AV Area VTI: 2.93 cm2
AV Area mean vel: 2.96 cm2
AV Mean grad: 8 mmHg
AV Peak grad: 15.2 mmHg
Ao pk vel: 1.95 m/s
Area-P 1/2: 4.86 cm2
P 1/2 time: 369 msec
S' Lateral: 3.2 cm
Weight: 2349.22 oz

## 2021-12-09 LAB — HIV ANTIBODY (ROUTINE TESTING W REFLEX): HIV Screen 4th Generation wRfx: NONREACTIVE

## 2021-12-09 LAB — CBC
HCT: 18.1 % — ABNORMAL LOW (ref 39.0–52.0)
HCT: 24.3 % — ABNORMAL LOW (ref 39.0–52.0)
Hemoglobin: 6.2 g/dL — CL (ref 13.0–17.0)
Hemoglobin: 8.1 g/dL — ABNORMAL LOW (ref 13.0–17.0)
MCH: 31.5 pg (ref 26.0–34.0)
MCH: 31.5 pg (ref 26.0–34.0)
MCHC: 33.3 g/dL (ref 30.0–36.0)
MCHC: 34.3 g/dL (ref 30.0–36.0)
MCV: 91.9 fL (ref 80.0–100.0)
MCV: 94.6 fL (ref 80.0–100.0)
Platelets: 115 10*3/uL — ABNORMAL LOW (ref 150–400)
Platelets: 129 10*3/uL — ABNORMAL LOW (ref 150–400)
RBC: 1.97 MIL/uL — ABNORMAL LOW (ref 4.22–5.81)
RBC: 2.57 MIL/uL — ABNORMAL LOW (ref 4.22–5.81)
RDW: 17.7 % — ABNORMAL HIGH (ref 11.5–15.5)
RDW: 18.3 % — ABNORMAL HIGH (ref 11.5–15.5)
WBC: 8.2 10*3/uL (ref 4.0–10.5)
WBC: 9 10*3/uL (ref 4.0–10.5)
nRBC: 0 % (ref 0.0–0.2)
nRBC: 0 % (ref 0.0–0.2)

## 2021-12-09 LAB — BASIC METABOLIC PANEL
Anion gap: 11 (ref 5–15)
BUN: 211 mg/dL — ABNORMAL HIGH (ref 8–23)
CO2: 15 mmol/L — ABNORMAL LOW (ref 22–32)
Calcium: 7.3 mg/dL — ABNORMAL LOW (ref 8.9–10.3)
Chloride: 117 mmol/L — ABNORMAL HIGH (ref 98–111)
Creatinine, Ser: 10.24 mg/dL — ABNORMAL HIGH (ref 0.61–1.24)
GFR, Estimated: 5 mL/min — ABNORMAL LOW (ref >60)
Glucose, Bld: 167 mg/dL — ABNORMAL HIGH (ref 70–99)
Potassium: 3.6 mmol/L (ref 3.5–5.1)
Sodium: 143 mmol/L (ref 135–145)

## 2021-12-09 LAB — PHOSPHORUS: Phosphorus: 4.7 mg/dL — ABNORMAL HIGH (ref 2.5–4.6)

## 2021-12-09 LAB — PREPARE RBC (CROSSMATCH)

## 2021-12-09 LAB — MAGNESIUM: Magnesium: 1.7 mg/dL (ref 1.7–2.4)

## 2021-12-09 SURGERY — ESOPHAGOGASTRODUODENOSCOPY (EGD) WITH PROPOFOL
Anesthesia: Monitor Anesthesia Care

## 2021-12-09 MED ORDER — EPINEPHRINE 1 MG/10ML IJ SOSY
PREFILLED_SYRINGE | INTRAMUSCULAR | Status: AC
  Filled 2021-12-09: qty 10

## 2021-12-09 MED ORDER — PROPOFOL 500 MG/50ML IV EMUL
INTRAVENOUS | Status: DC | PRN
  Administered 2021-12-09: 100 ug/kg/min via INTRAVENOUS

## 2021-12-09 MED ORDER — POTASSIUM CHLORIDE 10 MEQ/50ML IV SOLN
10.0000 meq | INTRAVENOUS | Status: AC
  Administered 2021-12-09 (×4): 10 meq via INTRAVENOUS
  Filled 2021-12-09 (×4): qty 50

## 2021-12-09 MED ORDER — PROPOFOL 10 MG/ML IV BOLUS
INTRAVENOUS | Status: DC | PRN
  Administered 2021-12-09: 40 mg via INTRAVENOUS

## 2021-12-09 MED ORDER — MAGNESIUM SULFATE 2 GM/50ML IV SOLN
2.0000 g | Freq: Once | INTRAVENOUS | Status: AC
  Administered 2021-12-09: 2 g via INTRAVENOUS
  Filled 2021-12-09: qty 50

## 2021-12-09 MED ORDER — GLUCAGON HCL RDNA (DIAGNOSTIC) 1 MG IJ SOLR
INTRAMUSCULAR | Status: AC
Start: 2021-12-09 — End: ?
  Filled 2021-12-09: qty 1

## 2021-12-09 MED ORDER — SODIUM CHLORIDE 0.9% IV SOLUTION: Freq: Once | INTRAVENOUS | Status: AC

## 2021-12-09 MED ORDER — LACTATED RINGERS IV BOLUS
500.0000 mL | Freq: Once | INTRAVENOUS | Status: AC
  Administered 2021-12-09: 500 mL via INTRAVENOUS

## 2021-12-09 MED ORDER — NOREPINEPHRINE 4 MG/250ML-% IV SOLN
0.0000 ug/min | INTRAVENOUS | Status: DC
  Administered 2021-12-09: 8 ug/min via INTRAVENOUS
  Administered 2021-12-10: 4 ug/min via INTRAVENOUS
  Filled 2021-12-09 (×2): qty 250

## 2021-12-09 MED ORDER — SODIUM CHLORIDE (PF) 0.9 % IJ SOLN
PREFILLED_SYRINGE | INTRAMUSCULAR | Status: DC | PRN
  Administered 2021-12-09: 1 mL

## 2021-12-09 MED ORDER — SODIUM BICARBONATE 8.4 % IV SOLN
INTRAVENOUS | Status: DC
Start: 2021-12-09 — End: 2021-12-10
  Filled 2021-12-09 (×2): qty 150
  Filled 2021-12-09 (×2): qty 1000

## 2021-12-09 MED ORDER — GLUCAGON HCL RDNA (DIAGNOSTIC) 1 MG IJ SOLR
INTRAMUSCULAR | Status: DC | PRN
  Administered 2021-12-09 (×2): .5 mg via INTRAVENOUS

## 2021-12-09 MED ORDER — LIDOCAINE 2% (20 MG/ML) 5 ML SYRINGE
INTRAMUSCULAR | Status: DC | PRN
Start: 2021-12-09 — End: 2021-12-09
  Administered 2021-12-09: 100 mg via INTRAVENOUS

## 2021-12-09 MED ORDER — SODIUM CHLORIDE 0.9 % IV SOLN
INTRAVENOUS | Status: AC | PRN
  Administered 2021-12-09: 20 mL via INTRAMUSCULAR

## 2021-12-09 MED ORDER — SODIUM CHLORIDE 0.9 % IV SOLN: INTRAVENOUS | Status: DC

## 2021-12-09 SURGICAL SUPPLY — 15 items

## 2021-12-09 NOTE — Progress Notes (Signed)
[facility] ?Progress Note ? ?Subjective: seen in ICU. BP's better but still not great. On levo 4 ug/min. Creat down to 10 this am. Hb up 8.1. UOP good 800 cc yest and 1000 cc today so far. No new c/o's, tired, didn't get any sleep.  ? ?Vitals:  ? 12/09/21 0600 12/09/21 0615 12/09/21 0630 12/09/21 0645  ?BP: (!) 92/43 (!) 107/47 (!) 110/46 (!) 107/48  ?Pulse: 92 93 91 89  ?Resp: 19 13 18 15   ?Temp: 98.6 ?F (37 ?C) 98.8 ?F (37.1 ?C) 98.6 ?F (37 ?C) 98.8 ?F (37.1 ?C)  ?TempSrc:      ?SpO2: 100% 100% 100% 100%  ?Weight:      ? ? ?Exam: ?Gen fatigued appearing older AAM ?No jvd or bruits, flat neck veins ?Chest clear bilat to bases ?RRR no RG ?Abd soft ntnd no mass or ascites +bs ?GU normal male ?Ext no LE edema ?Neuro is alert, Ox 3, no asterixis ?   ?  ? Home meds include - amlodipine/ benazepril 10-40 qd, asa, hctz 12.5 qd, protonix, crestor, carafate, prns/ vits/ supps ?  ?  ?   Renal US 4/08 - FINDINGS: R kidney 10.4 cm, normal echo, no hydro or mass. L kidney 10.2 cm, normal echo and no hydro. Anechoic mass in medial L kidney, consistent w/ the lesion seen on CT. No stones. Bladder: Appears normal for degree of bladder distention. ?  ?   UA neg protein, neg ket, neg Hb, neg LE / nitrite, 1.013 ?  ?    Baseline creat 1.65 from Sep 07, 2021, eGFR 42 ml/min ?    Last creat on 11/29/21 (10 d ago) was 13.85.  ?  ?  ?Assessment/ Plan: ?Renal failure - severe AKI in pt w/ marked anemia, N/V, poor po intake, on ACEi at home and taking nsaids for stomach pains. Baseline creat 1.6 from Jan 2023.  UA here completely normal, renal US w/o obstruction, normal size/ echo. Urine lytes c/w vol depletion. Suspect AKI due to vol depletion+ ACEi + nsaids + hypotension. Doubt myeloma but does have severe AKI w/ anemia and normal UA, so we sent myeloma studies just to be on the safe side. Got 3.5 L bolus yest and getting bicarb gtt at 100 cc/hr. Foley in place. Creat down 15  > 11 > 10 this am. AKI seems to be improving.  Cont bicarb gtt at 125 cc/hr. Will follow.  ?Shock - per CCM, is on levo drip at low dose 4-6 ug/min. BP's a bit better.  ?Anemia - Hb 6.3 on admission, up to 8.1 this am after prbc's. GI consulted, for endoscopy today.  ?HTN - holding ACEi/ hctz for AKI and hypotension. BP's remain soft.  ?COPD ?PAD ?  ?  ?  ? ? ? ? ?Joshua Curtis ?12/09/2021, 6:51 AM ? ? ?Recent Labs  ?Lab 12/08/21 ?1018 12/08/21 ?2037 12/08/21 ?2341 12/09/21 ?0400  ?HGB 6.3* 8.0* 8.1*  --   ?ALBUMIN 3.0*  --   --   --   ?CALCIUM 8.2* 7.5*  --  7.3*  ?PHOS  --   --   --  4.7*  ?CREATININE 15.70* 11.09*  --  10.24*  ?K 4.7 4.3  --  3.6  ? ?Inpatient medications: ? Chlorhexidine Gluconate Cloth  6 each Topical Daily  ? mouth rinse  15 mL Mouth Rinse BID  ? ? sodium chloride    ? sodium chloride    ? sodium chloride 250 mL (12/09/21 0519)  ?  famotidine (PEPCID) IV Stopped (12/08/21 2242)  ? norepinephrine (LEVOPHED) Adult infusion 4 mcg/min (12/09/21 0529)  ? pantoprazole 8 mg/hr (12/09/21 0430)  ? sodium bicarbonate 150 mEq in D5W infusion 100 mL/hr at 12/09/21 0641  ? ?albuterol, docusate sodium, ondansetron (ZOFRAN) IV, polyethylene glycol ? ? ? ? ? ? ?

## 2021-12-09 NOTE — Progress Notes (Signed)
? ?NAME:  Joshua Curtis, MRN:  [id number], DOB:  05/17/1951, LOS: 1 ?ADMISSION DATE:  12/08/2021, CONSULTATION DATE:  12/08/2021 ?REFERRING MD:  Dr Dorie Rank, CHIEF COMPLAINT:  GI bleed , AKI and shock ?Primary care physician Dr. Cathlean Cower ? ?BRIEF  ? ?71 year old male with a past medical history of hypertension on baby aspirin ACE inhibitor and hydrochlorothiazide, COPD not otherwise specified atherosclerosis peripheral arterial disease hyperlipidemia.  And also incidental bicuspid arctic valve June 2022.  Saw primary care physician on 11/29/2021 with 4 days of epigastric pain using NSAIDs [does not use alcohol].  Associated weight loss was observed of 14 pounds [168 pounds February 2023 > 154 pounds 11/29/2021] with a resultant BMI of 22.1 and cachexia.  Primary care physician stopped his Mobic and added Protonix and Carafate and urgent referral to GI with CT abdomen.  His creatinine the following day was 1.65 mg percent with mild acute kidney injury [baseline 1.1-1.2 mg percent August 2022] ? ?Then on 12/07/2021 started having some abdominal pain worsening and also some vomiting without any blood.  On 12/09/2018 3-day of admission he had dizziness and also black tarry stools and worsening abdominal pain and present to the ER ? ?In the ER creatinine much worse 15 mg percent bicarb low but potassium fine.  Hemoglobin 6 g% [normal hemoglobin 1 week earlier].  Hypotensive.  At the time of CCM evaluation had received 2.5 L of fluids and 1 unit of packed red blood cells and mean arterial pressure of 58.  He said he had made urine in the ER. ? ?Renal and GI consulted. ? ? ?Past Medical History:  ? has a past medical history of Anemia, iron deficiency (04/02/2016), Aortic atherosclerosis ([place]), Arthritis, BPH (benign prostatic hyperplasia) (04/10/2017), Cancer ([place]), COLONIC POLYPS, HX OF (11/24/2007), COPD (chronic obstructive pulmonary disease) ([place]), Coronary artery calcification seen on CAT scan, DIVERTICULOSIS, COLON  (11/24/2007), ED (erectile dysfunction), GERD (gastroesophageal reflux disease), HLD (hyperlipidemia) (09/06/2020), HYPERTENSION (11/24/2007), Increased prostate specific antigen (PSA) velocity (06/22/2011), PAH (pulmonary artery hypertension) ([place]), and Peripheral vascular disease ([place]). ? ? has a past surgical history that includes No past surgeries; Colonoscopy; ABDOMINAL AORTOGRAM W/LOWER EXTREMITY (N/A, 07/04/2020); PERIPHERAL VASCULAR INTERVENTION (07/04/2020); ABDOMINAL AORTOGRAM W/LOWER EXTREMITY (N/A, 07/18/2020); PERIPHERAL VASCULAR INTERVENTION (Right, 07/18/2020); PERIPHERAL VASCULAR BALLOON ANGIOPLASTY (Right, 07/18/2020); Robot assisted laparoscopic radical prostatectomy (N/A, 01/08/2021); ABDOMINAL AORTOGRAM W/LOWER EXTREMITY (N/A, 04/04/2021); Bypass graft femoral-peroneal (Right, 04/13/2021); and Patch angioplasty (Right, 04/13/2021). ? reports that he quit smoking about 7 months ago. His smoking use included cigarettes. He smoked an average of .25 packs per day. He has never used smokeless tobacco. ? ? ?Significant Hospital Events:  ?12/08/2021 - admit ? ? ?Interim History / Subjective:  ? ?12/09/2021 -still on Levophed 5 mcg.  On nasal cannula oxygen.  No active bleeding.  Had black tarry stools at 11 PM last night.  No blood in NG tube.  Making good urine and creatinine improving.  CT scan shows thickening of the duodenum and gastric antrum.  Consideration for renal cyst.  He is NPO.  Getting ready for endoscopy.  2 units blood yesterday.  Getting another unit of blood this morning for hemoglobin less than 7. ? ?Objective   ?Blood pressure (!) 104/47, pulse 91, temperature 98.2 ?F (36.8 ?C), temperature source Oral, resp. rate 16, weight 66.6 kg, SpO2 100 %. ?   ?   ? ?Intake/Output Summary (Last 24 hours) at 12/09/2021 0921 ?Last data filed at 12/09/2021 1884 ?Gross per 24 hour  ?Intake 5547.77 ml  ?  Output 800 ml  ?Net 4747.77 ml  ? ?Filed Weights  ? 12/09/21 0500  ?Weight: 66.6 kg  ? ? ?Examination: ?General: Thin  male lying in bed.  Has blanket over him.  Cachectic.  Has muscle wasting. ?HENT: Supple neck no elevated JVP no neck nodes. NG tube +. No returns ?Lungs: No distress.  Clear to auscultation bilaterally. ?Cardiovascular: Regular rate and rhythm no murmurs.  Hypotensive ?Abdomen: Soft but does have lower abdominal tenderness but no rebound rigidity or guarding. Improved ?Extremities: No cyanosis no clubbing no edema ?Neuro: Alert and oriented x3 ?GU: Not examined ? ?Resolved Hospital Problem list   ?X ? ?Assessment & Plan:  ?COPD NoS - Prior to & Present on Admit ? ?Plan ?- alb prn ?   ?Circulatory shock secondary to GI bleed upper ? ?- 12/09/2021:  Levophed 31mg ? ?P:  ?Levophed for mean artery pressure greater than 65 ?Check cortisol 12/10/21 am ?Central line if Levophed requirements go up/actively bleeding ? ? ? ?Baseline hypertension - Prior to & Present on Admit - on hydrochlorothiazide ACE inhibitor and calcium channel blocker ? ? ? ?- 12/09/2021:  Trop x 2 neg. EKG ok ? ?P: ?Await echo ?Cycle enzymes ? ? ?Baseline - normal renal function ?Severe renal failure - Prior to & Present on Admit.  ?- Onset 09/04/21 -NSAIDs, GI bleed ACE inhibitor ? = admit 4/8: Creat 15, bic 13 and pH 7.2s ? ?12/09/2021 - creat down to 10s. Making urine. Bic better at 15 but on bicgtt ? ?P:  ?Foley ?Volume resustictiona - repeat 500 cc bolus ?Cotninue bic gtt ?Appreciate renal consult ? ? ?Concern for renal cyst - Present on Admit ? ?Plan ?- imaging at some point ? ? ?  ?Mild hypomag 12/09/21 ? ?P: ?Monitor closely- will replete once creat better some more ? ? ? ?UGI bleed - melena - Present on Admit ?Abd pain and vomit - Prior to & Present on Admit ? ? ?12/09/2021 - no active bleed but has had melen and hgb < 7gm%. CT with antral and duodenal thickening.  ? ?P:   ?NG tube ?Protonix gtt ?Gi consult - Dr hung - scope 4/9/2 ?NPO ?Dr5 LR ? ? ?Blood loss anemia - Present on Admit - s/p 2 U PRBC in ER ? ?4/9 hgb < 7g% and 1u ordered ? ?P:  ?- PRBC for  hgb </= 6.9gm%   ? - exceptions are ?  -  if ACS susepcted/confirmed then transfuse for hgb </= 8.0gm%,  or  ?  -  active bleeding with hemodynamic instability, then transfuse regardless of hemoglobin value ?  At at all times try to transfuse 1 unit prbc as possible with exception of active hemorrhage ? ? ?WEight loss  - Prior to & Present on Admit ?Failure to Thrive - Prior to & Present on Admit ? ?Plan ?Monitor ? ? ?Best practice (daily eval):  ?Diet: npo ?Pain/Anxiety/Delirium protocol (if indicated): x ?VAP protocol (if indicated): x ?DVT prophylaxis: SCD ?GI prophylaxis: PPI gtt ?Glucose control: ssi ?Mobility: bed rest ?Code Status: full code ?Family Communication:  ? 4/8- wife, daugher , patient in ER with RN present ?4/9 - wife updated over phone ?Disposition: [facility] ? ? ?Goals of Care:  ? ?Full code ? ? ? ? ?[place] ? ?The patient CSHAMAN MUSCARELLAis critically ill with multiple organ systems failure and requires high complexity decision making for assessment and support, frequent evaluation and titration of therapies, application of advanced monitoring  technologies and extensive interpretation of multiple databases.  ? ?Critical Care Time devoted to patient care services described in this note is  35  Minutes. This time reflects time of care of this signee Dr Brand Males. This critical care time does not reflect procedure time, or teaching time or supervisory time of PA/NP/Med student/Med Resident etc but could involve care discussion time  ? ? ? ?Dr. Brand Males, M.D., F.C.C.P ?Pulmonary and Critical Care Medicine ?Medical Director - [facility] ICU ?Staff Physician, [place] ?[facility] ?Pager: [phone], If no answer or between  15:00h - 7:00h: call [phone] ? ?12/09/2021 ?9:38 AM ? ? ? ?SIGNATURE  ? ? ?Dr. Brand Males, M.D., F.C.C.P,  ?Pulmonary and Critical Care Medicine ?Staff Physician, [place] ?Center  Director - Interstitial Lung Disease  Program  ?Pulmonary [place] [facility] ?[place], [place], [address] ? ?NPI Number:  NPI #[id number] ? ?Pager: [phone], If no an

## 2021-12-09 NOTE — Progress Notes (Signed)
ABG collected and send down to lab for analysis. Called lab about abg. ?

## 2021-12-09 NOTE — Op Note (Signed)
[facility] ?Patient Name: Joshua Curtis ?Procedure Date: 12/09/2021 ?MRN: [id number] ?Attending MD: Carol Ada , MD ?Date of Birth: [birth year] ?CSN: [id number] ?Age: 71 ?Admit Type: Inpatient ?Procedure:                Upper GI endoscopy ?Indications:              Iron deficiency anemia, Melena ?Providers:                Carol Ada, MD, Grace Isaac, RN, Tyna Jaksch  ?                          Technician ?Referring MD:              ?Medicines:                Propofol per Anesthesia ?Complications:            No immediate complications. ?Estimated Blood Loss:     Estimated blood loss: none. ?Procedure:                Pre-Anesthesia Assessment: ?                          - Prior to the procedure, a History and Physical  ?                          was performed, and patient medications and  ?                          allergies were reviewed. The patient's tolerance of  ?                          previous anesthesia was also reviewed. The risks  ?                          and benefits of the procedure and the sedation  ?                          options and risks were discussed with the patient.  ?                          All questions were answered, and informed consent  ?                          was obtained. Prior Anticoagulants: The patient has  ?                          taken no previous anticoagulant or antiplatelet  ?                          agents. ASA Grade Assessment: III - A patient with  ?                          severe systemic disease. After reviewing the risks  ?  and benefits, the patient was deemed in  ?                          satisfactory condition to undergo the procedure. ?                          - Sedation was administered by an anesthesia  ?                          professional. Deep sedation was attained. ?                          After obtaining informed consent, the endoscope was  ?                          passed under direct vision.  Throughout the  ?                          procedure, the patient's blood pressure, pulse, and  ?                          oxygen saturations were monitored continuously. The  ?                          GIF-H190 ([id number]) Olympus endoscope was introduced  ?                          through the mouth, and advanced to the second part  ?                          of duodenum. The upper GI endoscopy was  ?                          accomplished without difficulty. The patient  ?                          tolerated the procedure well. ?Scope In: ?Scope Out: ?Findings: ?     The esophagus was normal. ?     Diffuse mild inflammation characterized by erythema was found in the  ?     gastric body and in the gastric antrum. ?     A single small Dieulafoylesion with bleeding was found in the second  ?     portion of the duodenum. Area was unsuccessfully injected with 1 mL of a  ?     1:10,000 solution of epinephrine for hemostasis. Coagulation for  ?     hemostasis using bipolar probe was successful. To stop active bleeding,  ?     three hemostatic clips were successfully placed. There was no bleeding  ?     at the end of the procedure. ?     Blood was found in the antrum. In the duodenal bulb there was evidence  ?     of fresh blood an some small clots. In D2 there was evidence of rapid  ?     bleeding from the medial wall of the duodenum. With washing a small  ?  Dieulafoy's lesion was identified. The bleeding was slowed with 1 ml of  ?     1:10,000 Epi. BICAP was applied and several applications were required  ?     as the peristalsis was significant. It was not abated with 1 mg of  ?     glucagon. The BICAP resolved the bleeding and the decision was made to  ?     place three hemoclips to ensure complete cessation of bleeding. There  ?     was no further reaccumulation of blood in this area. ?Impression:               - Normal esophagus. ?                          - Gastritis. ?                          - A single bleeding  Dieulafoy lesion in the  ?                          duodenum. Treatment not successful. Treated with  ?                          bipolar cautery. Clips were placed. ?                          - No specimens collected. ?Moderate Sedation: ?     Not Applicable - Patient had care per Anesthesia. ?Recommendation:           - Return patient to hospital ward for ongoing care. ?                          - Clear liquid diet. ?                          - Continue present medications. ?                          - Follow HGB and transfuse as necessary. ?Procedure Code(s):        --- Professional --- ?                          10932, Esophagogastroduodenoscopy, flexible,  ?                          transoral; with control of bleeding, any method ?Diagnosis Code(s):        --- Professional --- ?                          K29.70, Gastritis, unspecified, without bleeding ?                          K31.811, Angiodysplasia of stomach and duodenum  ?                          with bleeding ?  D50.9, Iron deficiency anemia, unspecified ?                          K92.1, Melena (includes Hematochezia) ?CPT copyright 2019 American Medical Association. All rights reserved. ?The codes documented in this report are preliminary and upon coder review may  ?be revised to meet current compliance requirements. ?Carol Ada, MD ?Carol Ada, MD ?12/09/2021 10:51:16 AM ?This report has been signed electronically. ?Number of Addenda: 0 ?

## 2021-12-09 NOTE — Transfer of Care (Signed)
Immediate Anesthesia Transfer of Care Note ? ?Patient: ATTHEW COUTANT ? ?Procedure(s) Performed: ESOPHAGOGASTRODUODENOSCOPY (EGD) WITH PROPOFOL ?SCLEROTHERAPY ?HOT HEMOSTASIS (ARGON PLASMA COAGULATION/BICAP) ? ?Patient Location: PACU ? ?Anesthesia Type:MAC ? ?Level of Consciousness: awake, alert , oriented and patient cooperative ? ?Airway & Oxygen Therapy: Patient Spontanous Breathing and Patient connected to face mask oxygen ? ?Post-op Assessment: Report given to RN and Post -op Vital signs reviewed and stable ? ?Post vital signs: Reviewed and stable ? ?Last Vitals:  ?Vitals Value Taken Time  ?BP 103/52 12/09/21 1053  ?Temp 37.1 ?C 12/09/21 1053  ?Pulse 102 12/09/21 1055  ?Resp 19 12/09/21 1058  ?SpO2 100 % 12/09/21 1055  ?Vitals shown include unvalidated device data. ? ?Last Pain:  ?Vitals:  ? 12/09/21 1053  ?TempSrc:   ?PainSc: 0-No pain  ?   ? ?  ? ?Complications: No notable events documented. ?

## 2021-12-09 NOTE — Anesthesia Postprocedure Evaluation (Signed)
Anesthesia Post Note ? ?Patient: Joshua Curtis ? ?Procedure(s) Performed: ESOPHAGOGASTRODUODENOSCOPY (EGD) WITH PROPOFOL ?SCLEROTHERAPY ?HOT HEMOSTASIS (ARGON PLASMA COAGULATION/BICAP) ? ?  ? ?Patient location during evaluation: PACU ?Anesthesia Type: MAC ?Level of consciousness: awake ?Pain management: pain level controlled ?Vital Signs Assessment: post-procedure vital signs reviewed and stable ?Respiratory status: spontaneous breathing ?Cardiovascular status: stable ?Postop Assessment: no apparent nausea or vomiting ?Anesthetic complications: no ? ? ?No notable events documented. ? ?Last Vitals:  ?Vitals:  ? 12/09/21 1053 12/09/21 1100  ?BP: (!) 103/52 (!) 109/59  ?Pulse: (!) 103 96  ?Resp: (!) 25 16  ?Temp: 37.1 ?C   ?SpO2: 100% 97%  ?  ?Last Pain:  ?Vitals:  ? 12/09/21 1100  ?TempSrc:   ?PainSc: 0-No pain  ? ? ?  ?  ?  ?  ?  ?  ? ?Huston Foley ? ? ? ? ?

## 2021-12-09 NOTE — Progress Notes (Signed)
? ? ?  Recent Labs  ?Lab 12/08/21 ?2341 12/09/21 ?0400 12/09/21 ?1300  ?HGB 8.1* 6.2* 6.8*  ?HCT 24.3* 18.1* 19.4*  ?WBC 9.0 8.2 12.8*  ?PLT 129* 115* 99*  ? ?PLan ? - 1 unit more PRBC ? ? ? ?SIGNATURE  ? ? ?Dr. Brand Males, M.D., F.C.C.P,  ?Pulmonary and Critical Care Medicine ?Staff Physician, [place] ?Center Director - Interstitial Lung Disease  Program  ?Medical Director - [place] ICU ?Pulmonary [place] [facility] ?[place], [place], [address] ? ?NPI Number:  NPI #[id number] ?DEA Number: [id number] ? ?Pager: [phone], If no answer  -> Check AMION or Try [phone] ?Telephone (clinical office): [phone] ?Telephone (research): [phone] ? ?1:13 PM ?12/09/2021 ? ?

## 2021-12-09 NOTE — Procedures (Signed)
Central Venous Catheter Insertion Procedure Note ? ?Joshua Curtis  ?[id number]  ?22-Nov-1950 ? ?Date:12/09/21  ?Time:2:32 AM  ? ?Provider Performing:Jeffery Gammell A Marcellius Montagna  ? ?Procedure: Insertion of Non-tunneled Central Venous Catheter(36556) with US guidance (88325)  ? ?Indication(s) ?Medication administration and Difficult access ? ?Consent ?Risks of the procedure as well as the alternatives and risks of each were explained to the patient and/or caregiver.  Consent for the procedure was obtained and is signed in the bedside chart ? ?Anesthesia ?Topical only with 1% lidocaine  ? ?Timeout ?Verified patient identification, verified procedure, site/side was marked, verified correct patient position, special equipment/implants available, medications/allergies/relevant history reviewed, required imaging and test results available. ? ?Sterile Technique ?Maximal sterile technique including full sterile barrier drape, hand hygiene, sterile gown, sterile gloves, mask, hair covering, sterile ultrasound probe cover (if used). ? ?Procedure Description ?Area of catheter insertion was cleaned with chlorhexidine and draped in sterile fashion.  With real-time ultrasound guidance a central venous catheter was placed into the right internal jugular vein. Nonpulsatile blood flow and easy flushing noted in all ports.  The catheter was sutured in place and sterile dressing applied. ? ?Complications/Tolerance ?None; patient tolerated the procedure well. ?Chest X-ray is ordered to verify placement for internal jugular or subclavian cannulation.   Chest x-ray is not ordered for femoral cannulation. ? ?EBL ?Minimal ? ?Specimen(s) ?None ? ? ?Rufina Falco, DNP, CCRN, FNP-C, AGACNP-BC ?Acute Care Nurse Practitioner  ?[facility] ?Pager: [phone] ?[name] at [facility] ? ?

## 2021-12-09 NOTE — Plan of Care (Signed)
?  Problem: Education: Goal: Knowledge of General Education information will improve Description: Including pain rating scale, medication(s)/side effects and non-pharmacologic comfort measures Outcome: Progressing   Problem: Clinical Measurements: Goal: Respiratory complications will improve Outcome: Progressing Goal: Cardiovascular complication will be avoided Outcome: Progressing   Problem: Coping: Goal: Level of anxiety will decrease Outcome: Progressing   

## 2021-12-09 NOTE — Anesthesia Preprocedure Evaluation (Signed)
Anesthesia Evaluation  ?Patient identified by MRN, date of birth, ID band ?Patient awake ? ? ? ?Reviewed: ?Allergy & Precautions, NPO status , Patient's Chart, lab work & pertinent test results ? ?History of Anesthesia Complications ?Negative for: history of anesthetic complications ? ?Airway ?Mallampati: II ? ?TM Distance: >3 FB ?Neck ROM: Full ? ? ? Dental ? ?(+) Dental Advisory Given, Partial Upper, Partial Lower ?  ?Pulmonary ?COPD, Current Smoker and Patient abstained from smoking., former smoker,  ?  ?Pulmonary exam normal ? ? ? ? ? ? ? Cardiovascular ?hypertension, Pt. on medications ?pulmonary hypertension+ CAD and + Peripheral Vascular Disease  ?Normal cardiovascular exam ? ? ?'22 Myoperfusion - The left ventricular ejection fraction is mildly decreased (45-54%). ?Nuclear stress EF: 48%. ?Blood pressure demonstrated a hypertensive response to exercise. ?Upsloping ST segment depression ST segment depression of 0.5 mm was noted during stress in the II and III leads, beginning at 5 minutes of stress, and returning to baseline after less than 1 minute of recovery. ?This is an intermediate risk study. ?Negative stress test for ischemia. ?There is evidence of apical thinning; there is no wall motion abnormality consistent with apical infarct. ?Prominent left ventricular hypertrophy on ECG. ? ? ?  ?Neuro/Psych ?negative neurological ROS ? negative psych ROS  ? GI/Hepatic ?Neg liver ROS, GERD  Controlled and Medicated,  ?Endo/Other  ?negative endocrine ROS ? Renal/GU ?negative Renal ROS  ? ?  ?Musculoskeletal ? ?(+) Arthritis ,  ? Abdominal ?Normal abdominal exam  (+)   ?Peds ? Hematology ? ?(+) Blood dyscrasia, anemia ,  ?On plavix ?   ?Anesthesia Other Findings ?Covid test negative ? ? Reproductive/Obstetrics ? ?  ? ? ? ? ? ? ? ? ? ? ? ? ? ?  ?  ? ? ? ? ? ? ? ? ?Anesthesia Physical ? ?Anesthesia Plan ? ?ASA: 3 ? ?Anesthesia Plan: MAC  ? ?Post-op Pain Management:   ? ?Induction:  Intravenous ? ?PONV Risk Score and Plan: 2 and Treatment may vary due to age or medical condition, Propofol infusion and TIVA ? ?Airway Management Planned: Natural Airway and Mask ? ?Additional Equipment:  ? ?Intra-op Plan:  ? ?Post-operative Plan:  ? ?Informed Consent: I have reviewed the patients History and Physical, chart, labs and discussed the procedure including the risks, benefits and alternatives for the proposed anesthesia with the patient or authorized representative who has indicated his/her understanding and acceptance.  ? ? ? ?Dental advisory given ? ?Plan Discussed with: CRNA ? ?Anesthesia Plan Comments:   ? ? ? ? ? ? ?Anesthesia Quick Evaluation ? ?

## 2021-12-09 NOTE — Interval H&P Note (Signed)
History and Physical Interval Note: ? ?12/09/2021 ?10:17 AM ? ?Joshua Curtis  has presented today for surgery, with the diagnosis of Anemia and epigastric pain.  The various methods of treatment have been discussed with the patient and family. After consideration of risks, benefits and other options for treatment, the patient has consented to  Procedure(s): ?ESOPHAGOGASTRODUODENOSCOPY (EGD) WITH PROPOFOL (N/A) as a surgical intervention.  The patient's history has been reviewed, patient examined, no change in status, stable for surgery.  I have reviewed the patient's chart and labs.  Questions were answered to the patient's satisfaction.   ? ? ?Ulyana Pitones D ? ? ?

## 2021-12-09 NOTE — Progress Notes (Signed)
?  Echocardiogram ?2D Echocardiogram has been performed. ? ?[name] ?12/09/2021, 12:52 PM ?

## 2021-12-10 ENCOUNTER — Encounter (HOSPITAL_COMMUNITY): Payer: Self-pay | Admitting: Gastroenterology

## 2021-12-10 DIAGNOSIS — D62 Acute posthemorrhagic anemia: Secondary | ICD-10-CM

## 2021-12-10 DIAGNOSIS — N179 Acute kidney failure, unspecified: Secondary | ICD-10-CM

## 2021-12-10 DIAGNOSIS — K922 Gastrointestinal hemorrhage, unspecified: Secondary | ICD-10-CM | POA: Diagnosis not present

## 2021-12-10 DIAGNOSIS — K3182 Dieulafoy lesion (hemorrhagic) of stomach and duodenum: Principal | ICD-10-CM

## 2021-12-10 DIAGNOSIS — K921 Melena: Secondary | ICD-10-CM

## 2021-12-10 DIAGNOSIS — R578 Other shock: Secondary | ICD-10-CM | POA: Diagnosis not present

## 2021-12-10 LAB — CBC
HCT: 21.2 % — ABNORMAL LOW (ref 39.0–52.0)
HCT: 21.2 % — ABNORMAL LOW (ref 39.0–52.0)
Hemoglobin: 7.4 g/dL — ABNORMAL LOW (ref 13.0–17.0)
Hemoglobin: 7.6 g/dL — ABNORMAL LOW (ref 13.0–17.0)
MCH: 31 pg (ref 26.0–34.0)
MCH: 31.7 pg (ref 26.0–34.0)
MCHC: 34.9 g/dL (ref 30.0–36.0)
MCHC: 35.8 g/dL (ref 30.0–36.0)
MCV: 88.3 fL (ref 80.0–100.0)
MCV: 88.7 fL (ref 80.0–100.0)
Platelets: 95 10*3/uL — ABNORMAL LOW (ref 150–400)
Platelets: 98 10*3/uL — ABNORMAL LOW (ref 150–400)
RBC: 2.39 MIL/uL — ABNORMAL LOW (ref 4.22–5.81)
RBC: 2.4 MIL/uL — ABNORMAL LOW (ref 4.22–5.81)
RDW: 15.6 % — ABNORMAL HIGH (ref 11.5–15.5)
RDW: 15.7 % — ABNORMAL HIGH (ref 11.5–15.5)
WBC: 8.8 10*3/uL (ref 4.0–10.5)
WBC: 9 10*3/uL (ref 4.0–10.5)
nRBC: 0 % (ref 0.0–0.2)
nRBC: 0 % (ref 0.0–0.2)

## 2021-12-10 LAB — COMPREHENSIVE METABOLIC PANEL
ALT: 10 U/L (ref 0–44)
AST: 14 U/L — ABNORMAL LOW (ref 15–41)
Albumin: 2.5 g/dL — ABNORMAL LOW (ref 3.5–5.0)
Alkaline Phosphatase: 21 U/L — ABNORMAL LOW (ref 38–126)
Anion gap: 8 (ref 5–15)
BUN: 143 mg/dL — ABNORMAL HIGH (ref 8–23)
CO2: 30 mmol/L (ref 22–32)
Calcium: 7.3 mg/dL — ABNORMAL LOW (ref 8.9–10.3)
Chloride: 110 mmol/L (ref 98–111)
Creatinine, Ser: 5.7 mg/dL — ABNORMAL HIGH (ref 0.61–1.24)
GFR, Estimated: 10 mL/min — ABNORMAL LOW (ref >60)
Glucose, Bld: 146 mg/dL — ABNORMAL HIGH (ref 70–99)
Potassium: 2.9 mmol/L — ABNORMAL LOW (ref 3.5–5.1)
Sodium: 148 mmol/L — ABNORMAL HIGH (ref 135–145)
Total Bilirubin: 0.5 mg/dL (ref 0.3–1.2)
Total Protein: 4.7 g/dL — ABNORMAL LOW (ref 6.5–8.1)

## 2021-12-10 LAB — PROTIME-INR
INR: 1.3 — ABNORMAL HIGH (ref 0.8–1.2)
Prothrombin Time: 15.7 seconds — ABNORMAL HIGH (ref 11.4–15.2)

## 2021-12-10 LAB — KAPPA/LAMBDA LIGHT CHAINS
Kappa free light chain: 79 mg/L — ABNORMAL HIGH (ref 3.3–19.4)
Kappa, lambda light chain ratio: 1.82 — ABNORMAL HIGH (ref 0.26–1.65)
Lambda free light chains: 43.5 mg/L — ABNORMAL HIGH (ref 5.7–26.3)

## 2021-12-10 LAB — PHOSPHORUS: Phosphorus: 3.5 mg/dL (ref 2.5–4.6)

## 2021-12-10 LAB — TROPONIN I (HIGH SENSITIVITY): Troponin I (High Sensitivity): 37 ng/L — ABNORMAL HIGH (ref <18)

## 2021-12-10 LAB — LACTIC ACID, PLASMA: Lactic Acid, Venous: 0.9 mmol/L (ref 0.5–1.9)

## 2021-12-10 LAB — CORTISOL-AM, BLOOD: Cortisol - AM: 10.2 ug/dL (ref 6.7–22.6)

## 2021-12-10 LAB — GLUCOSE, CAPILLARY: Glucose-Capillary: 120 mg/dL — ABNORMAL HIGH (ref 70–99)

## 2021-12-10 LAB — MAGNESIUM: Magnesium: 1.6 mg/dL — ABNORMAL LOW (ref 1.7–2.4)

## 2021-12-10 MED ORDER — POTASSIUM CHLORIDE CRYS ER 20 MEQ PO TBCR
40.0000 meq | EXTENDED_RELEASE_TABLET | Freq: Once | ORAL | Status: AC
  Administered 2021-12-10: 40 meq via ORAL
  Filled 2021-12-10: qty 2

## 2021-12-10 MED ORDER — CALCIUM GLUCONATE-NACL 1-0.675 GM/50ML-% IV SOLN
1.0000 g | Freq: Once | INTRAVENOUS | Status: AC
  Administered 2021-12-10: 1000 mg via INTRAVENOUS
  Filled 2021-12-10: qty 50

## 2021-12-10 MED ORDER — LACTATED RINGERS IV SOLN: INTRAVENOUS | Status: AC

## 2021-12-10 MED ORDER — POTASSIUM CHLORIDE 20 MEQ PO PACK: 40.0000 meq | PACK | Freq: Once | ORAL | Status: DC

## 2021-12-10 MED ORDER — MAGNESIUM SULFATE 2 GM/50ML IV SOLN
2.0000 g | Freq: Once | INTRAVENOUS | Status: AC
  Administered 2021-12-10: 2 g via INTRAVENOUS
  Filled 2021-12-10: qty 50

## 2021-12-10 MED ORDER — LACTATED RINGERS IV BOLUS
1000.0000 mL | Freq: Once | INTRAVENOUS | Status: AC
  Administered 2021-12-10: 1000 mL via INTRAVENOUS

## 2021-12-10 NOTE — TOC Initial Note (Signed)
Transition of Care (TOC) - Initial/Assessment Note  ? ? ?Patient Details  ?Name: Joshua Curtis ?MRN: [id number] ?Date of Birth: 02/13/1951 ? ?Transition of Care (TOC) CM/SW Contact:    ?Tawanna Cooler, RN ?Phone Number: ?12/10/2021, 8:18 AM ? ?Clinical Narrative:                 ? ?Transition of Care Department [facility]) has reviewed patient and no TOC needs have been identified at this time. We will continue to monitor patient advancement through interdisciplinary progression rounds. If new patient transition needs arise, please place a TOC consult. ? ? ?Expected Discharge Plan: Home/Self Care ?Barriers to Discharge: Continued Medical Work up ? ? ?Expected Discharge Plan and Services ?Expected Discharge Plan: Home/Self Care ?  ?  ?  ?Living arrangements for the past 2 months: [place] ?                ?  ?Prior Living Arrangements/Services ?Living arrangements for the past 2 months: [place] ?Lives with:: Spouse ?Patient language and need for interpreter reviewed:: Yes ?       ?Need for Family Participation in Patient Care: Yes (Comment) ?Care giver support system in place?: Yes (comment) ?  ?Criminal Activity/Legal Involvement Pertinent to Current Situation/Hospitalization: No - Comment as needed ? ?Emotional Assessment ? ?Orientation: : Oriented to Self, Oriented to Place, Oriented to  Time, Oriented to Situation ?Alcohol / Substance Use: Not Applicable ?Psych Involvement: No (comment) ? ?Admission diagnosis:  Hemorrhagic shock ([place]) [R57.8] ?Uremia [N19] ?Acute upper GI bleeding [K92.2] ?Acute renal failure, unspecified acute renal failure type ([place]) [N17.9] ?Hypotension due to hypovolemia [I95.89, E86.1] ?Patient Active Problem List  ? Diagnosis Date Noted  ? Hemorrhagic shock ([place]) 12/08/2021  ? Bicuspid aortic valve 11/29/2021  ? Nausea and vomiting 11/29/2021  ? Epigastric pain 11/29/2021  ? Acute pain of both shoulders 09/09/2021  ? Preventative health care 09/07/2021  ? Cough 09/07/2021   ? PAD (peripheral artery disease) ([place]) 04/13/2021  ? PAOD (peripheral arterial occlusive disease) ([place]) 04/13/2021  ? Prostate cancer ([place]) 01/08/2021  ? HLD (hyperlipidemia) 09/06/2020  ? PVD (peripheral vascular disease) ([place]) 07/18/2020  ? Acute osteomyelitis ([place]) 05/02/2020  ? Open wound of right great toe 04/13/2020  ? Open wound of left great toe 04/13/2020  ? Cellulitis of toe, left 04/13/2020  ? Vitamin D deficiency 10/26/2019  ? Right knee pain 04/21/2019  ? Aortic atherosclerosis ([place])   ? PAH (pulmonary artery hypertension) ([place])   ? Coronary artery calcification seen on CAT scan   ? Thoracic aortic aneurysm ([place]) 10/27/2017  ? Elevated PSA 10/14/2017  ? Pulmonary nodule 10/14/2017  ? Colon polyps 10/14/2017  ? BPH (benign prostatic hyperplasia) 04/10/2017  ? Abnormal CT of the abdomen 01/12/2017  ? Former smoker 01/12/2017  ? Anemia, iron deficiency 04/02/2016  ? Racing heart beat 10/03/2015  ? Hyperglycemia 03/30/2015  ? Anemia 03/30/2015  ? Hypertension 09/25/2012  ? Increased prostate specific antigen (PSA) velocity 06/22/2011  ? Encounter for well adult exam with abnormal findings 06/16/2011  ? DIVERTICULOSIS, COLON 11/24/2007  ? COLONIC POLYPS, HX OF 11/24/2007  ? ?PCP:  Biagio Borg, MD ?Pharmacy:   ?[id number] [place] [facility] [address]?[place] [address]?Phone: [phone]: [phone]? ? ?Readmission Risk Interventions ? ?  12/10/2021  ?  8:17 AM  ?Readmission Risk Prevention Plan  ?Transportation Screening Complete  ?PCP or Specialist  Appt within 3-5 Days Complete  ?[place] or Home Care Consult Complete  ? ? ? ?

## 2021-12-10 NOTE — Progress Notes (Signed)
? ?NAME:  Joshua Curtis, MRN:  [id number], DOB:  Oct 16, 1950, LOS: 2 ?ADMISSION DATE:  12/08/2021, CONSULTATION DATE:  12/08/2021 ?REFERRING MD:  Dr Dorie Rank, CHIEF COMPLAINT:  GI bleed , AKI and shock ?Primary care physician Dr. Cathlean Cower ? ?BRIEF  ? ?71 year old male with a past medical history of hypertension on baby aspirin ACE inhibitor and hydrochlorothiazide, COPD not otherwise specified atherosclerosis peripheral arterial disease hyperlipidemia.  And also incidental bicuspid arctic valve June 2022.  Saw primary care physician on 11/29/2021 with 4 days of epigastric pain using NSAIDs [does not use alcohol].  Associated weight loss was observed of 14 pounds [168 pounds February 2023 > 154 pounds 11/29/2021] with a resultant BMI of 22.1 and cachexia.  Primary care physician stopped his Mobic and added Protonix and Carafate and urgent referral to GI with CT abdomen.  His creatinine the following day was 1.65 mg percent with mild acute kidney injury [baseline 1.1-1.2 mg percent August 2022] ? ?Then on 12/07/2021 started having some abdominal pain worsening and also some vomiting without any blood.  On 12/09/2018 3-day of admission he had dizziness and also black tarry stools and worsening abdominal pain and present to the ER ? ?In the ER creatinine much worse 15 mg percent bicarb low but potassium fine.  Hemoglobin 6 g% [normal hemoglobin 1 week earlier].  Hypotensive.  At the time of CCM evaluation had received 2.5 L of fluids and 1 unit of packed red blood cells and mean arterial pressure of 58.  He said he had made urine in the ER. ? ?Renal and GI consulted.  Found to have bleeding Dieulafoy lesion that was treated with Epi, cautery and endoclips. ? ? ?Past Medical History:  ? has a past medical history of Anemia, iron deficiency (04/02/2016), Aortic atherosclerosis ([place]), Arthritis, BPH (benign prostatic hyperplasia) (04/10/2017), Cancer ([place]), COLONIC POLYPS, HX OF (11/24/2007), COPD (chronic obstructive pulmonary  disease) ([place]), Coronary artery calcification seen on CAT scan, DIVERTICULOSIS, COLON (11/24/2007), ED (erectile dysfunction), GERD (gastroesophageal reflux disease), HLD (hyperlipidemia) (09/06/2020), HYPERTENSION (11/24/2007), Increased prostate specific antigen (PSA) velocity (06/22/2011), PAH (pulmonary artery hypertension) ([place]), and Peripheral vascular disease ([place]). ? ? has a past surgical history that includes No past surgeries; Colonoscopy; ABDOMINAL AORTOGRAM W/LOWER EXTREMITY (N/A, 07/04/2020); PERIPHERAL VASCULAR INTERVENTION (07/04/2020); ABDOMINAL AORTOGRAM W/LOWER EXTREMITY (N/A, 07/18/2020); PERIPHERAL VASCULAR INTERVENTION (Right, 07/18/2020); PERIPHERAL VASCULAR BALLOON ANGIOPLASTY (Right, 07/18/2020); Robot assisted laparoscopic radical prostatectomy (N/A, 01/08/2021); ABDOMINAL AORTOGRAM W/LOWER EXTREMITY (N/A, 04/04/2021); Bypass graft femoral-peroneal (Right, 04/13/2021); and Patch angioplasty (Right, 04/13/2021). ? reports that he quit smoking about 7 months ago. His smoking use included cigarettes. He smoked an average of .25 packs per day. He has never used smokeless tobacco. ? ? ?Significant Hospital Events:  ?12/08/2021 - admit ?4/9 EGD by GI>      A single small Dieulafoylesion with bleeding was found in the second portion of the duodenum. Levophed, transfused, total of 4 units since admission ?4/10 Levophed requirement decreasing, Hgb 7.4, renal function improving ? ? ? ?Interim History / Subjective:  ? ?No further melena or bleeding overnight, pt feels cold but otherwise no complaints  ? ?Objective   ?Blood pressure (!) 92/54, pulse 73, temperature 98.6 ?F (37 ?C), temperature source Core, resp. rate 19, weight 64.7 kg, SpO2 99 %. ?   ?   ? ?Intake/Output Summary (Last 24 hours) at 12/10/2021 0926 ?Last data filed at 12/10/2021 0800 ?Gross per 24 hour  ?Intake 4083.76 ml  ?Output 6750 ml  ?Net -2666.24 ml  ? ? ?  Filed Weights  ? 12/09/21 0500 12/09/21 1333 12/10/21 0500  ?Weight: 66.6 kg 70.8 kg 64.7 kg   ? ?General:  thin M, resting in bed in no acute distress ?HEENT: MM pink/moist, sclera anicteric ?Neuro: awake, alert, oriented and following commands ?CV: s1s2 rrr, no m/r/g ?PULM:  no distress, no wheezing or rhonchi on room air ?GI: soft, bsx4 active  ?Extremities: warm/dry, no edema  ?Skin: no rashes or lesions ? ?Labs reviewed: ?Hgb 7.4 ?Mag 1.6 ?K 2.9 ?Creatinine 5.7 ? ?Resolved Hospital Problem list   ? ? ?Assessment & Plan:  ? ? ?Hemorrhagic shock secondary to acute upper GIB ?S/p EGD with small Dieulafoylesion with bleeding was found in the second portion of the duodenum s/p epi injection, cautery and three endoclips ?-remains on low dose levophed, total of 4 units PRBC's since admission and Hgb 7.4 this AM, no melena overnight ?-titrate down Levophed to maintain MAP >65, hopefully off today  ?-trend CBC ?-diet advanced to full liquid ?-continue PPI ?-appreciate GI following  ?-transfuse for Hgb <7 ? ? ? ? ? ?Acute Kidney Injury ?Creatinine as high as 10 ?-renal following, appreciate recs, ARF likely secondary to NSAIDS, Ace Inhibitor and hypotension ?-creatinine improving and 5L UOP, suspect post-ATN diuresis, bicarb improved, will stop bicarb gtt and give 1L LR ?-continue to monitor ?-nephrology sent myeloma studies ?-CT abd/pelvis and Renal US showed 1.9cm mass on L kidney most likely Bosniak category 2, mildly complicated cyst but not fully characterized , will need outpatient f/u ? ? ? ? ? ?Weight loss  - Prior to & Present on Admit ?Failure to Thrive - Prior to & Present on Admit ?-Monitor, nutrition consult when advanced to full diet ? ? ? ?HTN ?-hold home medications in the setting of hypotension ? ? ? ? ?Best practice (daily eval):  ?Diet: full liquid ?Pain/Anxiety/Delirium protocol (if indicated): x ?VAP protocol (if indicated): x ?DVT prophylaxis: SCD ?GI prophylaxis: PPI gtt ?Glucose control: ssi ?Mobility: bed rest ?Code Status: full code ?Family Communication:  ? 4/8- wife, daugher , patient  in ER with RN present ?4/9 - wife updated over phone ?4/10 pt able to communicate with family ? ? ? ?Goals of Care:  ? ?Full code ? ? ? ?LABS  ? ? ?PULMONARY ?Recent Labs  ?Lab 12/08/21 ?1615 12/09/21 ?0403  ?PHART 7.26* 7.36  ?PCO2ART 22* 26*  ?PO2ART 106 95  ?HCO3 9.9* 14.7*  ?O2SAT 100 98.8  ? ? ? ?CBC ?Recent Labs  ?Lab 12/09/21 ?1800 12/09/21 ?2346 12/10/21 ?3428  ?HGB 8.0* 7.6* 7.4*  ?HCT 22.8* 21.2* 21.2*  ?WBC 10.1 9.0 8.8  ?PLT 103* 98* 95*  ? ? ? ?COAGULATION ?Recent Labs  ?Lab 12/08/21 ?1050 12/10/21 ?7681  ?INR 1.5* 1.3*  ? ? ? ?CARDIAC ? No results for input(s): TROPONINI in the last 168 hours. ?No results for input(s): PROBNP in the last 168 hours. ? ?CHEMISTRY ?Recent Labs  ?Lab 12/08/21 ?1018 12/08/21 ?2037 12/09/21 ?0400 12/10/21 ?1572  ?NA 139 144 143 148*  ?K 4.7 4.3 3.6 2.9*  ?CL 111 121* 117* 110  ?CO2 13* 11* 15* 30  ?GLUCOSE 155* 132* 167* 146*  ?BUN 274* 230* 211* 143*  ?CREATININE 15.70* 11.09* 10.24* 5.70*  ?CALCIUM 8.2* 7.5* 7.3* 7.3*  ?MG 2.2  --  1.7 1.6*  ?PHOS  --   --  4.7* 3.5  ? ? ?Estimated Creatinine Clearance: 11.4 mL/min (A) (by C-G formula based on SCr of 5.7 mg/dL (H)). ? ? ?LIVER ?Recent Labs  ?Lab  12/08/21 ?1018 12/08/21 ?1050 12/10/21 ?5993  ?AST 9*  --  14*  ?ALT 13  --  10  ?ALKPHOS 30*  --  21*  ?BILITOT 0.3  --  0.5  ?PROT 5.7*  --  4.7*  ?ALBUMIN 3.0*  --  2.5*  ?INR  --  1.5* 1.3*  ? ? ? ? ?INFECTIOUS ?Recent Labs  ?Lab 12/08/21 ?1018 12/10/21 ?5701  ?LATICACIDVEN 1.6 0.9  ? ? ? ? ?ENDOCRINE ?CBG (last 3)  ?No results for input(s): GLUCAP in the last 72 hours. ? ? ? ? ? ? ?IMAGING x48h  - image(s) personally visualized  -   highlighted in bold ?CT ABDOMEN PELVIS WO CONTRAST ? ?Result Date: 12/08/2021 ?CLINICAL DATA:  Abdominal pain, acute, nonlocalized EXAM: CT ABDOMEN AND PELVIS WITHOUT CONTRAST TECHNIQUE: Multidetector CT imaging of the abdomen and pelvis was performed following the standard protocol without IV contrast. RADIATION DOSE REDUCTION: This exam was  performed according to the departmental dose-optimization program which includes automated exposure control, adjustment of the mA and/or kV according to patient size and/or use of iterative reconstruction techni

## 2021-12-10 NOTE — Progress Notes (Signed)
[facility] ?Progress Note ? ?Subjective: Having post- ATN diuresis- nearly 6L UOP overnight.  Sleeping, wife at bedside.  Fluids changed from bicarb gtt to LR ? ?Vitals:  ? 12/10/21 1200 12/10/21 1215 12/10/21 1230 12/10/21 1234  ?BP: (!) 112/52 (!) 118/45 (!) 106/46   ?Pulse: (!) 56 66 65   ?Resp: 16 (!) 23 14   ?Temp:    98.5 ?F (36.9 ?C)  ?TempSrc:    Oral  ?SpO2: 96% 97% 95%   ?Weight:      ? ? ?Exam: ?Gen sleeping, NAD ?No jvd or bruits, flat neck veins ?Chest clear bilat to bases ?RRR no RG ?Abd soft ntnd no mass or ascites +bs ?GU normal male ?Ext no LE edema ?Neuro is alert, Ox 3, no asterixis ?   ?  ? Home meds include - amlodipine/ benazepril 10-40 qd, asa, hctz 12.5 qd, protonix, crestor, carafate, prns/ vits/ supps ?  ?  ?   Renal US 4/08 - FINDINGS: R kidney 10.4 cm, normal echo, no hydro or mass. L kidney 10.2 cm, normal echo and no hydro. Anechoic mass in medial L kidney, consistent w/ the lesion seen on CT. No stones. Bladder: Appears normal for degree of bladder distention. ?  ?   UA neg protein, neg ket, neg Hb, neg LE / nitrite, 1.013 ?  ?    Baseline creat 1.65 from Sep 07, 2021, eGFR 42 ml/min ?    Last creat on 11/29/21 (10 d ago) was 13.85.  ?  ?  ?Assessment/ Plan: ?Renal failure - severe AKI in pt w/ marked anemia, N/V, poor po intake, on ACEi at home and taking nsaids for stomach pains. Baseline creat 1.6 from Jan 2023.  UA here completely normal, renal US w/o obstruction, normal size/ echo. Urine lytes c/w vol depletion. Suspect AKI due to vol depletion+ ACEi + nsaids + hypotension. Doubt myeloma but does have severe AKI w/ anemia and normal UA, so we sent myeloma studies just to be on the safe side. Now having post- ATN diuresis- will increase LR to 125 mL/ hr ?Shock - per CCM, is on levo drip at low dose 4-6 ug/min. BP's a bit better.  ?Anemia - Hb 6.3 on admission, up to 8.1 this am after prbc's. GI consulted,, s.p endoscopy with bleeding Dieulafoy's lesion treated ?HTN -  holding ACEi/ hctz for AKI and hypotension. BP's remain soft but off NE gtt ?COPD ?PAD ?  ?  ?  ? ? ? ?Madelon Lips MD ?12/10/2021, 3:44 PM ? ? ?Recent Labs  ?Lab 12/08/21 ?1018 12/08/21 ?2037 12/09/21 ?0400 12/09/21 ?1300 12/09/21 ?2346 12/10/21 ?6256  ?HGB 6.3*   < > 6.2*   < > 7.6* 7.4*  ?ALBUMIN 3.0*  --   --   --   --  2.5*  ?CALCIUM 8.2*   < > 7.3*  --   --  7.3*  ?PHOS  --   --  4.7*  --   --  3.5  ?CREATININE 15.70*   < > 10.24*  --   --  5.70*  ?K 4.7   < > 3.6  --   --  2.9*  ? < > = values in this interval not displayed.  ? ?Inpatient medications: ? Chlorhexidine Gluconate Cloth  6 each Topical Daily  ? mouth rinse  15 mL Mouth Rinse BID  ? ? sodium chloride    ? sodium chloride Stopped (12/10/21 1514)  ? famotidine (PEPCID) IV Stopped (12/09/21 2221)  ?  lactated ringers    ? norepinephrine (LEVOPHED) Adult infusion Stopped (12/10/21 1320)  ? pantoprazole Stopped (12/10/21 1512)  ? ?albuterol, docusate sodium, ondansetron (ZOFRAN) IV, polyethylene glycol ? ? ? ? ? ? ?

## 2021-12-10 NOTE — Progress Notes (Addendum)
Patient ID: Joshua Curtis, male   DOB: [birth year], 71 y.o.   MRN: [id number] ? ? ? Progress Note ? ? Subjective  ? Day # 2 ? CC; iron deficiency anemia, melena, acute renal failure ? ?PPI infusion ? ?EGD 12/09/2021-diffuse mild gastritis, single small Dieulafoy lesion with bleeding in the second portion of the duodenum treated with epi/BiCap and Endo Clip x3 ? ?Pro time 15.7/INR 1.3 ?Lactic acid 0.9 ?WBC 8.8/hemoglobin 7.4/hematocrit 21.2/platelets 95 ?Troponin 37 ?Potassium 2.9/BUN 143/creatinine 5.7-improved ? ?Patient has no specific complaints today other than being very fatigued no complaints of abdominal pain, no nausea, he has been on a full liquid diet but says he does not have much appetite ?No bowel movement yesterday or today ? ? ? ? Objective  ? ?Vital signs in last 24 hours: ?Temp:  [98.1 ?F (36.7 ?C)-99.1 ?F (37.3 ?C)] 98.6 ?F (37 ?C) (04/10 0746) ?Pulse Rate:  [43-103] 76 (04/10 0815) ?Resp:  [9-30] 9 (04/10 0815) ?BP: (66-133)/(36-77) 109/52 (04/10 0815) ?SpO2:  [95 %-100 %] 98 % (04/10 0815) ?Weight:  [64.7 kg-70.8 kg] 64.7 kg (04/10 0500) ?Last BM Date : 12/09/21 ?General:   Older African-American male in NAD ?Heart:  Regular rate and rhythm; no murmurs ?Lungs: Respirations even and unlabored, lungs CTA bilaterally ?Abdomen:  Soft, nontender and nondistended. Normal bowel sounds. ?Extremities:  Without edema. ?Neurologic:  Alert and oriented,  grossly normal neurologically. ?Psych:  Cooperative. Normal mood and affect. ? ?Intake/Output from previous day: ?04/09 0701 - 04/10 0700 ?In: 3740.9 [I.V.:2316.5; Blood:651; IV Piggyback:773.5] ?Out: 5850 [Urine:5850] ?Intake/Output this shift: ?Total I/O ?In: 486.7 [I.V.:486.7] ?Out: 900 [Urine:900] ? ?Lab Results: ?Recent Labs  ?  12/09/21 ?1800 12/09/21 ?2346 12/10/21 ?0354  ?WBC 10.1 9.0 8.8  ?HGB 8.0* 7.6* 7.4*  ?HCT 22.8* 21.2* 21.2*  ?PLT 103* 98* 95*  ? ?BMET ?Recent Labs  ?  12/08/21 ?2037 12/09/21 ?0400 12/10/21 ?6568  ?NA 144 143 148*  ?K 4.3 3.6  2.9*  ?CL 121* 117* 110  ?CO2 11* 15* 30  ?GLUCOSE 132* 167* 146*  ?BUN 230* 211* 143*  ?CREATININE 11.09* 10.24* 5.70*  ?CALCIUM 7.5* 7.3* 7.3*  ? ?LFT ?Recent Labs  ?  12/10/21 ?1275  ?PROT 4.7*  ?ALBUMIN 2.5*  ?AST 14*  ?ALT 10  ?ALKPHOS 21*  ?BILITOT 0.5  ? ?PT/INR ?Recent Labs  ?  12/08/21 ?1050 12/10/21 ?1700  ?LABPROT 17.6* 15.7*  ?INR 1.5* 1.3*  ? ? ?Studies/Results: ?CT ABDOMEN PELVIS WO CONTRAST ? ?Result Date: 12/08/2021 ?CLINICAL DATA:  Abdominal pain, acute, nonlocalized EXAM: CT ABDOMEN AND PELVIS WITHOUT CONTRAST TECHNIQUE: Multidetector CT imaging of the abdomen and pelvis was performed following the standard protocol without IV contrast. RADIATION DOSE REDUCTION: This exam was performed according to the departmental dose-optimization program which includes automated exposure control, adjustment of the mA and/or kV according to patient size and/or use of iterative reconstruction technique. COMPARISON:  CT dated December 05, 2016 FINDINGS: Evaluation is limited by lack of IV contrast. Lower chest: RIGHT lower lobe pulmonary nodule measures 5 by 6 mm, unchanged dating back to 2018 and consistent with a benign etiology (series 6, image 3). Hepatobiliary: Focal fatty deposition adjacent to the falciform ligament. Decompressed gallbladder. Otherwise unremarkable noncontrast appearance of the liver. Pancreas: Coarse calcification of the pancreatic body, nonspecific and new since 2019. Unchanged punctate calcification of the pancreatic tail since 2019 likely reflecting the sequela of prior pancreatitis. Spleen: Unremarkable. Adrenals/Urinary Tract: Adrenal glands are unremarkable. In the LEFT kidney, there is an intrinsically hyperdense  mass which measures 18 mm with a Hounsfield unit of 60 (series 2, image 25). No hydronephrosis. LEFT-sided vascular calcifications. No obstructing nephrolithiasis. Bladder is decompressed. Stomach/Bowel: There is mild circumferential bowel wall thickening of the proximal duodenum.  Wall prominence of the gastric antrum. No evidence of bowel obstruction. Scattered colonic diverticulosis without evidence of acute diverticulitis. Appendix is normal. Vascular/Lymphatic: Severe vascular calcifications. Status post bypass stent graft placement in the RIGHT leg. No new lymphadenopathy identified. Reproductive: Status post prostatectomy. Other: No free air or free fluid. Musculoskeletal: Degenerative changes of bilateral hips. IMPRESSION: 1. Mild wall thickening of the gastric antrum and proximal duodenum. This is nonspecific and could reflect a nonspecific gastritis/duodenitis in the appropriate clinical setting. 2. There is an 18 mm intrinsically hyperdense mass of the LEFT kidney. This is nonspecific and could reflect a hemorrhagic/proteinaceous cyst versus other lesion. Recommend further dedicated nonemergent evaluation with renal cyst protocol CT or MRI. Aortic Atherosclerosis (ICD10-I70.0). Electronically Signed   By: Valentino Saxon M.D.   On: 12/08/2021 12:24  ? ?US RENAL ? ?Result Date: 12/08/2021 ?CLINICAL DATA:  Follow-up from CT performed earlier today. Acute kidney injury. EXAM: RENAL / URINARY TRACT ULTRASOUND COMPLETE COMPARISON:  None. FINDINGS: Right Kidney: Renal measurements: 10.4 x 4.4 x 6.1 cm = volume: 148.2 mL. Echogenicity within normal limits. No mass or hydronephrosis visualized. Left Kidney: Renal measurements: 10.2 x 5.5 x 5.3 cm = volume: 154.9 mL. Normal parenchymal echogenicity. Hypo to anechoic mass seen in the medial kidney, midpole, 1.9 x 1.1 x 1.4 cm, consistent with the hyperattenuating lesion seen on CT. This is not well defined sonographically. No other left renal masses, no stones and no hydronephrosis. Bladder: Appears normal for degree of bladder distention. Other: None hypoechoic IMPRESSION: 1. 1.9 cm mass along the medial left kidney midpole consistent with the hyperattenuating lesion seen on CT. The combination of the hyperattenuation and the hypo to  anechoic appearance on ultrasound strongly supports a Bosniak category 2, mildly complicated cyst. It is not, however, fully characterized on ultrasound. Consider follow-up assessment with repeat renal ultrasound in 4-6 months, versus earlier characterization, best performed with renal MRI without and with contrast. 2. No acute finding.  No hydronephrosis. Electronically Signed   By: Lajean Manes M.D.   On: 12/08/2021 13:58  ? ?DG CHEST PORT 1 VIEW ? ?Result Date: 12/09/2021 ?CLINICAL DATA:  Status post central line placement EXAM: PORTABLE CHEST 1 VIEW COMPARISON:  09/07/2021 FINDINGS: Cardiac shadow is within normal limits. Gastric catheter extends with the tip in the stomach although the proximal side port lies at the gastroesophageal junction. This should be advanced deeper into the stomach. Right jugular central line is noted in the mid SVC. No pneumothorax is noted. Lungs are clear. No bony abnormality is noted. IMPRESSION: No pneumothorax following central line placement Gastric catheter should be advanced further into the stomach. Electronically Signed   By: Inez Catalina M.D.   On: 12/09/2021 02:16  ? ?DG Abd Portable 1 View ? ?Result Date: 12/08/2021 ?CLINICAL DATA:  NG placement confirmation EXAM: PORTABLE ABDOMEN - 1 VIEW COMPARISON:  X-ray abdomen 12/08/2021 4:32 p.m. FINDINGS: Interval advancement of an enteric tube with tip and side port overlying the expected region of the gastric lumen. Likely Foley catheter temperature probe overlying the pelvis. Acute gaseous distension of the gastric lumen. The bowel gas pattern is normal. No radio-opaque calculi or other significant radiographic abnormality are seen. IMPRESSION: Enteric tube in good position. Electronically Signed   By: Clelia Croft.D.  On: 12/08/2021 18:07  ? ?DG Abd Portable 1 View ? ?Result Date: 12/08/2021 ?CLINICAL DATA:  Per chart: Pt BIBA from home. Pt c/o abd pain and loose stools for past week. N/V for 2X days.Pt stated they had a near  syncopal episode and guided themselves to the ground. No pain EXAM: PORTABLE ABDOMEN - 1 VIEW COMPARISON:  CT, 12/08/2021 FINDINGS: Nasal/orogastric tube passes into the lower esophagus, tip projecting at t

## 2021-12-11 DIAGNOSIS — E876 Hypokalemia: Secondary | ICD-10-CM

## 2021-12-11 DIAGNOSIS — D62 Acute posthemorrhagic anemia: Secondary | ICD-10-CM

## 2021-12-11 DIAGNOSIS — K3182 Dieulafoy lesion (hemorrhagic) of stomach and duodenum: Secondary | ICD-10-CM

## 2021-12-11 DIAGNOSIS — R578 Other shock: Secondary | ICD-10-CM | POA: Diagnosis not present

## 2021-12-11 LAB — CBC
HCT: 19.9 % — ABNORMAL LOW (ref 39.0–52.0)
HCT: 24.2 % — ABNORMAL LOW (ref 39.0–52.0)
Hemoglobin: 6.9 g/dL — CL (ref 13.0–17.0)
Hemoglobin: 8.3 g/dL — ABNORMAL LOW (ref 13.0–17.0)
MCH: 31.9 pg (ref 26.0–34.0)
MCH: 32 pg (ref 26.0–34.0)
MCHC: 34.7 g/dL (ref 30.0–36.0)
MCHC: 34.3 g/dL (ref 30.0–36.0)
MCV: 92.1 fL (ref 80.0–100.0)
MCV: 93.4 fL (ref 80.0–100.0)
Platelets: 95 10*3/uL — ABNORMAL LOW (ref 150–400)
Platelets: 95 10*3/uL — ABNORMAL LOW (ref 150–400)
RBC: 2.16 MIL/uL — ABNORMAL LOW (ref 4.22–5.81)
RBC: 2.59 MIL/uL — ABNORMAL LOW (ref 4.22–5.81)
RDW: 15.3 % (ref 11.5–15.5)
RDW: 14.8 % (ref 11.5–15.5)
WBC: 7.5 10*3/uL (ref 4.0–10.5)
WBC: 7.9 10*3/uL (ref 4.0–10.5)
nRBC: 0 % (ref 0.0–0.2)
nRBC: 0 % (ref 0.0–0.2)

## 2021-12-11 LAB — PROTEIN ELECTROPHORESIS, SERUM
A/G Ratio: 1.2 (ref 0.7–1.7)
Albumin ELP: 2.2 g/dL — ABNORMAL LOW (ref 2.9–4.4)
Alpha-1-Globulin: 0.2 g/dL (ref 0.0–0.4)
Alpha-2-Globulin: 0.6 g/dL (ref 0.4–1.0)
Beta Globulin: 0.6 g/dL — ABNORMAL LOW (ref 0.7–1.3)
Gamma Globulin: 0.5 g/dL (ref 0.4–1.8)
Globulin, Total: 1.9 g/dL — ABNORMAL LOW (ref 2.2–3.9)
Total Protein ELP: 4.1 g/dL — ABNORMAL LOW (ref 6.0–8.5)

## 2021-12-11 LAB — BASIC METABOLIC PANEL
Anion gap: 5 (ref 5–15)
BUN: 89 mg/dL — ABNORMAL HIGH (ref 8–23)
CO2: 31 mmol/L (ref 22–32)
Calcium: 7.3 mg/dL — ABNORMAL LOW (ref 8.9–10.3)
Chloride: 110 mmol/L (ref 98–111)
Creatinine, Ser: 3.63 mg/dL — ABNORMAL HIGH (ref 0.61–1.24)
GFR, Estimated: 17 mL/min — ABNORMAL LOW (ref >60)
Glucose, Bld: 93 mg/dL (ref 70–99)
Potassium: 3.3 mmol/L — ABNORMAL LOW (ref 3.5–5.1)
Sodium: 146 mmol/L — ABNORMAL HIGH (ref 135–145)

## 2021-12-11 LAB — PREPARE RBC (CROSSMATCH)

## 2021-12-11 LAB — MAGNESIUM: Magnesium: 1.7 mg/dL (ref 1.7–2.4)

## 2021-12-11 MED ORDER — LACTATED RINGERS IV SOLN: INTRAVENOUS | Status: AC

## 2021-12-11 MED ORDER — SODIUM CHLORIDE 0.9% IV SOLUTION: Freq: Once | INTRAVENOUS | Status: AC

## 2021-12-11 MED ORDER — POTASSIUM CHLORIDE 20 MEQ PO PACK
40.0000 meq | PACK | Freq: Once | ORAL | Status: AC
  Administered 2021-12-11: 40 meq via ORAL
  Filled 2021-12-11: qty 2

## 2021-12-11 MED ORDER — MAGNESIUM SULFATE 2 GM/50ML IV SOLN
2.0000 g | Freq: Once | INTRAVENOUS | Status: AC
  Administered 2021-12-11: 2 g via INTRAVENOUS
  Filled 2021-12-11: qty 50

## 2021-12-11 NOTE — Progress Notes (Signed)
[facility] ?Progress Note ? ?Subjective: Sitting in bed, NAD.  Going to get another 1 u pRBCs.  Renal labs improving ? ?Vitals:  ? 12/11/21 1100 12/11/21 1200 12/11/21 1214 12/11/21 1237  ?BP: 134/81 (!) 121/44  (!) 131/50  ?Pulse: 93 87 74 75  ?Resp: (!) _0 ?Temp:   98.4 ?F (36.9 ?C)   ?TempSrc:   Oral   ?SpO2: 100% 100% 98% 98%  ?Weight:      ? ? ?Exam: ?Gen sleeping, NAD ?No jvd or bruits, flat neck veins ?Chest clear bilat to bases ?RRR no RG ?Abd soft ntnd no mass or ascites +bs ?GU normal male ?Ext no LE edema ?Neuro is alert, Ox 3, no asterixis ?   ?  ? Home meds include - amlodipine/ benazepril 10-40 qd, asa, hctz 12.5 qd, protonix, crestor, carafate, prns/ vits/ supps ?  ?  ?   Renal US 4/08 - FINDINGS: R kidney 10.4 cm, normal echo, no hydro or mass. L kidney 10.2 cm, normal echo and no hydro. Anechoic mass in medial L kidney, consistent w/ the lesion seen on CT. No stones. Bladder: Appears normal for degree of bladder distention. ?  ?   UA neg protein, neg ket, neg Hb, neg LE / nitrite, 1.013 ?  ?    Baseline creat 1.65 from Sep 07, 2021, eGFR 42 ml/min ?    Last creat on 11/29/21 (10 d ago) was 13.85.  ?  ?  ?Assessment/ Plan: ?Renal failure - severe AKI in pt w/ marked anemia, N/V, poor po intake, on ACEi at home and taking nsaids for stomach pains. Baseline creat 1.6 from Jan 2023.  UA here completely normal, renal US w/o obstruction, normal size/ echo. Urine lytes c/w vol depletion. Suspect AKI due to vol depletion+ ACEi + nsaids + hypotension. Doubt myeloma but does have severe AKI w/ anemia and normal UA, so we sent myeloma studies just to be on the safe side- K/L light chain ratio reflective of AKI, SPEP pending (can be followed up as OP), doubt myeloma kidney. Now having post- ATN diuresis- renewed LR to 125 mL/ hr ?Shock - per CCM, off pressors with normal Bps now ?Anemia - Hb 6.3 on admission, up to 8.1 this am after prbc's. GI consulted,, s.p endoscopy with bleeding  Dieulafoy's lesion treated.  Getting pRBCs today ?HTN - BP normalized for now ?COPD ?PAD ?Dispo: will sign off for now.  Please call with questions.   ?  ?  ?  ? ? ? ?Madelon Lips MD ?12/11/2021, 2:46 PM ? ? ?Recent Labs  ?Lab 12/08/21 ?1018 12/08/21 ?2037 12/09/21 ?0400 12/09/21 ?1300 12/10/21 ?4944 12/11/21 ?9675  ?HGB 6.3*   < > 6.2*   < > 7.4* 6.9*  ?ALBUMIN 3.0*  --   --   --  2.5*  --   ?CALCIUM 8.2*   < > 7.3*  --  7.3* 7.3*  ?PHOS  --   --  4.7*  --  3.5  --   ?CREATININE 15.70*   < > 10.24*  --  5.70* 3.63*  ?K 4.7   < > 3.6  --  2.9* 3.3*  ? < > = values in this interval not displayed.  ? ?Inpatient medications: ? Chlorhexidine Gluconate Cloth  6 each Topical Daily  ? mouth rinse  15 mL Mouth Rinse BID  ? ? sodium chloride    ? sodium chloride 10 mL/hr at 12/11/21 1124  ? famotidine (PEPCID) IV  Stopped (12/10/21 2255)  ? lactated ringers    ? ?albuterol, docusate sodium, ondansetron (ZOFRAN) IV, polyethylene glycol ? ? ? ? ? ? ?

## 2021-12-11 NOTE — Progress Notes (Addendum)
? ?NAME:  Joshua Curtis, MRN:  [id number], DOB:  08/30/1951, LOS: 3 ?ADMISSION DATE:  12/08/2021, CONSULTATION DATE:  12/08/2021 ?REFERRING MD:  Dr Dorie Rank, CHIEF COMPLAINT:  GI bleed , AKI and shock ?Primary care physician Dr. Cathlean Cower ? ?BRIEF  ? ?71 year old male with a past medical history of hypertension on baby aspirin ACE inhibitor and hydrochlorothiazide, COPD not otherwise specified atherosclerosis peripheral arterial disease hyperlipidemia.  And also incidental bicuspid arctic valve June 2022.  Saw primary care physician on 11/29/2021 with 4 days of epigastric pain using NSAIDs [does not use alcohol].  Associated weight loss was observed of 14 pounds [168 pounds February 2023 > 154 pounds 11/29/2021] with a resultant BMI of 22.1 and cachexia.  Primary care physician stopped his Mobic and added Protonix and Carafate and urgent referral to GI with CT abdomen.  His creatinine the following day was 1.65 mg percent with mild acute kidney injury [baseline 1.1-1.2 mg percent August 2022] ? ?Then on 12/07/2021 started having some abdominal pain worsening and also some vomiting without any blood.  On 12/09/2018 3-day of admission he had dizziness and also black tarry stools and worsening abdominal pain and present to the ER ? ?In the ER creatinine much worse 15 mg percent bicarb low but potassium fine.  Hemoglobin 6 g% [normal hemoglobin 1 week earlier].  Hypotensive.  At the time of CCM evaluation had received 2.5 L of fluids and 1 unit of packed red blood cells and mean arterial pressure of 58.  He said he had made urine in the ER. ? ?Renal and GI consulted.  Found to have bleeding Dieulafoy lesion that was treated with Epi, cautery and endoclips. ? ? ?Past Medical History:  ? has a past medical history of Anemia, iron deficiency (04/02/2016), Aortic atherosclerosis ([place]), Arthritis, BPH (benign prostatic hyperplasia) (04/10/2017), Cancer ([place]), COLONIC POLYPS, HX OF (11/24/2007), COPD (chronic obstructive pulmonary  disease) ([place]), Coronary artery calcification seen on CAT scan, DIVERTICULOSIS, COLON (11/24/2007), ED (erectile dysfunction), GERD (gastroesophageal reflux disease), HLD (hyperlipidemia) (09/06/2020), HYPERTENSION (11/24/2007), Increased prostate specific antigen (PSA) velocity (06/22/2011), PAH (pulmonary artery hypertension) ([place]), and Peripheral vascular disease ([place]). ? ? has a past surgical history that includes No past surgeries; Colonoscopy; ABDOMINAL AORTOGRAM W/LOWER EXTREMITY (N/A, 07/04/2020); PERIPHERAL VASCULAR INTERVENTION (07/04/2020); ABDOMINAL AORTOGRAM W/LOWER EXTREMITY (N/A, 07/18/2020); PERIPHERAL VASCULAR INTERVENTION (Right, 07/18/2020); PERIPHERAL VASCULAR BALLOON ANGIOPLASTY (Right, 07/18/2020); Robot assisted laparoscopic radical prostatectomy (N/A, 01/08/2021); ABDOMINAL AORTOGRAM W/LOWER EXTREMITY (N/A, 04/04/2021); Bypass graft femoral-peroneal (Right, 04/13/2021); Patch angioplasty (Right, 04/13/2021); Esophagogastroduodenoscopy (egd) with propofol (N/A, 12/09/2021); Sclerotherapy (12/09/2021); and Hot hemostasis (N/A, 12/09/2021). ? reports that he quit smoking about 7 months ago. His smoking use included cigarettes. He smoked an average of .25 packs per day. He has never used smokeless tobacco. ? ? ?Significant Hospital Events:  ?12/08/2021 - admit ?4/9 EGD by GI>      A single small Dieulafoylesion with bleeding was found in the second portion of the duodenum. Levophed, transfused, total of 4 units since admission ?4/10 Levophed requirement decreasing, Hgb 7.4, renal function improving ?4/11 Off Levophed, had additional bloody BM, but is stable  ? ? ? ?Interim History / Subjective:  ? ?One episode of dark maroon BM today, no hypotension, Hgb 6.9, additional unit PRBC's transfused ? ?Objective   ?Blood pressure (!) 103/54, pulse 73, temperature 98.1 ?F (36.7 ?C), temperature source Oral, resp. rate 11, weight 66.3 kg, SpO2 97 %. ?   ?   ? ?Intake/Output Summary (Last 24 hours) at 12/11/2021 0735 ?  Last  data filed at 12/11/2021 0600 ?Gross per 24 hour  ?Intake 3166.7 ml  ?Output 5150 ml  ?Net -1983.3 ml  ? ? ?Filed Weights  ? 12/09/21 1333 12/10/21 0500 12/11/21 0630  ?Weight: 70.8 kg 64.7 kg 66.3 kg  ? ?General:  thin elderly M, resting in bed in no distress ?HEENT: MM pink/moist ?Neuro: awake, alert, oriented and in no distress ?CV: s1s2 rrr, no m/r/g ?PULM:  clear bilaterally on RA ?GI: soft, bsx4 active  ?Extremities: warm/dry, no edema  ?Skin: no rashes or lesions ? ?Labs reviewed: ?Hgb 6.9 ?Mag 1.7 ?K 3.3 ?Creatinine 5.7 ? ?Resolved Hospital Problem list   ? ? ?Assessment & Plan:  ? ? ?Hemorrhagic shock secondary to acute upper GIB ?S/p EGD with small Dieulafoylesion with bleeding was found in the second portion of the duodenum s/p epi injection, cautery and three endoclips ?-Off pressors and clinically feeling improved today, AM Hgb 6.9, transfused one unit then had dark maroon BM, repeat CBC pending but remains stable ?-diet advanced to full liquid ?-continue PPI gtt today and then transition to oral tomorrow per GI ?-appreciate GI following  ?-transfuse for Hgb <7 ? ? ? ? ? ?Acute Kidney Injury ?Creatinine as high as 10 ?-renal following, appreciate recs, ARF likely secondary to NSAIDS, Ace Inhibitor and hypotension ?-continues to show signs of renal recovery with improving creatinine and good UOP ?-continue to monitor ?-nephrology sent myeloma studies ?-CT abd/pelvis and Renal US showed 1.9cm mass on L kidney most likely Bosniak category 2, mildly complicated cyst but not fully characterized , will need outpatient f/u ? ? ?Hypokalemia ?Hypomagnesemia ?-monitor and replete as needed ? ? ?Weight loss  - Prior to & Present on Admit ?Failure to Thrive - Prior to & Present on Admit ?-Monitor, nutrition consult when advanced to full diet ? ? ? ?HTN ?-hold home medications in the setting of hypotension ? ? ? ? ?Best practice (daily eval):  ?Diet: full liquid ?Pain/Anxiety/Delirium protocol (if indicated): x ?VAP  protocol (if indicated): x ?DVT prophylaxis: SCD ?GI prophylaxis: PPI gtt ?Glucose control: ssi ?Mobility: bed rest ?Code Status: full code ?Family Communication:  ? 4/8- wife, daugher , patient in ER with RN present ?4/9 - wife updated over phone ?4/10 pt able to communicate with family ? ? ? ?Goals of Care:  ? ?Full code, improving ? ?LABS  ? ? ?PULMONARY ?Recent Labs  ?Lab 12/08/21 ?1615 12/09/21 ?0403  ?PHART 7.26* 7.36  ?PCO2ART 22* 26*  ?PO2ART 106 95  ?HCO3 9.9* 14.7*  ?O2SAT 100 98.8  ? ? ? ?CBC ?Recent Labs  ?Lab 12/09/21 ?1800 12/09/21 ?2346 12/10/21 ?6073  ?HGB 8.0* 7.6* 7.4*  ?HCT 22.8* 21.2* 21.2*  ?WBC 10.1 9.0 8.8  ?PLT 103* 98* 95*  ? ? ? ?COAGULATION ?Recent Labs  ?Lab 12/08/21 ?1050 12/10/21 ?7106  ?INR 1.5* 1.3*  ? ? ? ?CARDIAC ? No results for input(s): TROPONINI in the last 168 hours. ?No results for input(s): PROBNP in the last 168 hours. ? ?CHEMISTRY ?Recent Labs  ?Lab 12/08/21 ?1018 12/08/21 ?2037 12/09/21 ?0400 12/10/21 ?2694 12/11/21 ?8546  ?NA 139 144 143 148* 146*  ?K 4.7 4.3 3.6 2.9* 3.3*  ?CL 111 121* 117* 110 110  ?CO2 13* 11* 15* 30 31  ?GLUCOSE 155* 132* 167* 146* 93  ?BUN 274* 230* 211* 143* 89*  ?CREATININE 15.70* 11.09* 10.24* 5.70* 3.63*  ?CALCIUM 8.2* 7.5* 7.3* 7.3* 7.3*  ?MG 2.2  --  1.7 1.6* 1.7  ?PHOS  --   --  4.7* 3.5  --   ? ? ?Estimated Creatinine Clearance: 18.3 mL/min (A) (by C-G formula based on SCr of 3.63 mg/dL (H)). ? ? ?LIVER ?Recent Labs  ?Lab 12/08/21 ?1018 12/08/21 ?1050 12/10/21 ?4709  ?AST 9*  --  14*  ?ALT 13  --  10  ?ALKPHOS 30*  --  21*  ?BILITOT 0.3  --  0.5  ?PROT 5.7*  --  4.7*  ?ALBUMIN 3.0*  --  2.5*  ?INR  --  1.5* 1.3*  ? ? ? ? ?INFECTIOUS ?Recent Labs  ?Lab 12/08/21 ?1018 12/10/21 ?6283  ?LATICACIDVEN 1.6 0.9  ? ? ? ? ?ENDOCRINE ?CBG (last 3)  ?Recent Labs  ?  12/10/21 ?1214  ?GLUCAP 120*  ? ? ? ? ? ? ? ?IMAGING x48h  - image(s) personally visualized  -   highlighted in bold ?ECHOCARDIOGRAM COMPLETE ? ?Result Date: 12/09/2021 ?   ECHOCARDIOGRAM  REPORT   Patient Name:   BERTIS HUSTEAD Luttrull Date of Exam: 12/09/2021 Medical Rec #:  [id number]        Height:       70.0 in Accession #:    [id number]       Weight:       146.8 lb Date of Birth:  Aug 23, 1951        BSA:

## 2021-12-11 NOTE — Progress Notes (Addendum)
Patient ID: Joshua Curtis, male   DOB: 02-Feb-1951, 71 y.o.   MRN: [id number] ? ? ? Progress Note ? ? Subjective  ? Day #3 ? CC; acute GI bleed/-Dieulafoyesion second portion of duodenum /acute kidney injury/shock ? ?PPI infusion ?Levophed/low-dose ? ?WBC 7.5/hemoglobin 6.9 down from 7.4 yesterday-receiving another unit today (total of 4 units since admission) ?Potassium 3.3/BUN 89/creatinine 3.63-improved ? ?Patient sitting up in chair, says he is feeling better, no complaints of nausea or abdominal pain, no stools.  Had tolerated liquid diet without difficulty ? ? ? Objective  ? ?Vital signs in last 24 hours: ?Temp:  [98.1 ?F (36.7 ?C)-98.7 ?F (37.1 ?C)] 98.4 ?F (36.9 ?C) (04/11 6948) ?Pulse Rate:  [56-85] 65 (04/11 0735) ?Resp:  [9-23] 15 (04/11 0735) ?BP: (89-152)/(39-95) 114/58 (04/11 0700) ?SpO2:  [90 %-100 %] 96 % (04/11 0735) ?Weight:  [66.3 kg] 66.3 kg (04/11 0630) ?Last BM Date : 12/10/21 ?General:    Older African-American male in NAD ?Heart:  Regular rate and rhythm; no murmurs ?Lungs: Respirations even and unlabored, lungs CTA bilaterally ?Abdomen:  Soft, nontender and nondistended. Normal bowel sounds. ?Extremities:  Without edema. ?Neurologic:  Alert and oriented,  grossly normal neurologically. ?Psych:  Cooperative. Normal mood and affect. ? ?Intake/Output from previous day: ?04/10 0701 - 04/11 0700 ?In: 3653.4 [I.V.:2609.8; IV Piggyback:1043.6] ?Out: 5150 [NIOEV:0350] ?Intake/Output this shift: ?No intake/output data recorded. ? ?Lab Results: ?Recent Labs  ?  12/09/21 ?2346 12/10/21 ?0938 12/11/21 ?1829  ?WBC 9.0 8.8 7.5  ?HGB 7.6* 7.4* 6.9*  ?HCT 21.2* 21.2* 19.9*  ?PLT 98* 95* 95*  ? ?BMET ?Recent Labs  ?  12/09/21 ?0400 12/10/21 ?9371 12/11/21 ?6967  ?NA 143 148* 146*  ?K 3.6 2.9* 3.3*  ?CL 117* 110 110  ?CO2 15* 30 31  ?GLUCOSE 167* 146* 93  ?BUN 211* 143* 89*  ?CREATININE 10.24* 5.70* 3.63*  ?CALCIUM 7.3* 7.3* 7.3*  ? ?LFT ?Recent Labs  ?  12/10/21 ?8938  ?PROT 4.7*  ?ALBUMIN 2.5*  ?AST 14*   ?ALT 10  ?ALKPHOS 21*  ?BILITOT 0.5  ? ?PT/INR ?Recent Labs  ?  12/08/21 ?1050 12/10/21 ?1017  ?LABPROT 17.6* 15.7*  ?INR 1.5* 1.3*  ? ? ?Studies/Results: ?ECHOCARDIOGRAM COMPLETE ? ?Result Date: 12/09/2021 ?   ECHOCARDIOGRAM REPORT   Patient Name:   Joshua Curtis Athanas Date of Exam: 12/09/2021 Medical Rec #:  [id number]        Height:       70.0 in Accession #:    [id number]       Weight:       146.8 lb Date of Birth:  [birth year]        BSA:          1.830 m? Patient Age:    10 years         BP:           105/59 mmHg Patient Gender: M                HR:           80 bpm. Exam Location:  Inpatient Procedure: 2D Echo, Cardiac Doppler and Color Doppler Indications:    Hypotension  History:        Patient has no prior history of Echocardiogram examinations.  Sonographer:    Merrie Roof RDCS Referring Phys: [place]  1. Left ventricular ejection fraction, by estimation, is 60 to 65%. The left ventricle has normal function. The left ventricle has no regional  wall motion abnormalities. There is mild left ventricular hypertrophy. Left ventricular diastolic parameters were normal.  2. Right ventricular systolic function is normal. The right ventricular size is normal. Tricuspid regurgitation signal is inadequate for assessing PA pressure.  3. Left atrial size was mildly dilated.  4. RA central line noted.  5. No evidence of mitral valve regurgitation.  6. Aortic valve regurgitation is mild to moderate. Aortic valve sclerosis/calcification is present, without any evidence of aortic stenosis.  7. The inferior vena cava is normal in size with greater than 50% respiratory variability, suggesting right atrial pressure of 3 mmHg. Comparison(s): No prior Echocardiogram. FINDINGS  Left Ventricle: Left ventricular ejection fraction, by estimation, is 60 to 65%. The left ventricle has normal function. The left ventricle has no regional wall motion abnormalities. The left ventricular internal cavity size was normal in  size. There is  mild left ventricular hypertrophy. Left ventricular diastolic parameters were normal. Right Ventricle: The right ventricular size is normal. No increase in right ventricular wall thickness. Right ventricular systolic function is normal. Tricuspid regurgitation signal is inadequate for assessing PA pressure. Left Atrium: Left atrial size was mildly dilated. Right Atrium: RA central line noted. Right atrial size was normal in size. Pericardium: There is no evidence of pericardial effusion. Mitral Valve: There is mild thickening of the mitral valve leaflet(s). There is mild calcification of the mitral valve leaflet(s). No evidence of mitral valve regurgitation. Tricuspid Valve: The tricuspid valve is grossly normal. Tricuspid valve regurgitation is not demonstrated. Aortic Valve: Aortic valve regurgitation is mild to moderate. Aortic regurgitation PHT measures 369 msec. Aortic valve sclerosis/calcification is present, without any evidence of aortic stenosis. Aortic valve mean gradient measures 8.0 mmHg. Aortic valve  peak gradient measures 15.2 mmHg. Aortic valve area, by VTI measures 2.93 cm?. Pulmonic Valve: Pulmonic valve regurgitation is not visualized. Aorta: The aortic root and ascending aorta are structurally normal, with no evidence of dilitation. Venous: The inferior vena cava is normal in size with greater than 50% respiratory variability, suggesting right atrial pressure of 3 mmHg. IAS/Shunts: The interatrial septum was not well visualized.  LEFT VENTRICLE PLAX 2D LVIDd:         4.70 cm   Diastology LVIDs:         3.20 cm   LV e' medial:    10.70 cm/s LV PW:         1.00 cm   LV E/e' medial:  9.9 LV IVS:        1.00 cm   LV e' lateral:   13.50 cm/s LVOT diam:     2.30 cm   LV E/e' lateral: 7.9 LV SV:         107 LV SV Index:   58 LVOT Area:     4.15 cm?  RIGHT VENTRICLE             IVC RV Basal diam:  2.80 cm     IVC diam: 2.00 cm RV S prime:     16.80 cm/s TAPSE (M-mode): 2.3 cm LEFT ATRIUM              Index        RIGHT ATRIUM           Index LA diam:        3.50 cm 1.91 cm/m?   RA Area:     16.30 cm? LA Vol (A2C):   50.3 ml 27.48 ml/m?  RA Volume:   42.20 ml  23.06 ml/m?  LA Vol (A4C):   60.2 ml 32.89 ml/m? LA Biplane Vol: 57.8 ml 31.58 ml/m?  AORTIC VALVE AV Area (Vmax):    3.07 cm? AV Area (Vmean):   2.96 cm? AV Area (VTI):     2.93 cm? AV Vmax:           195.00 cm/s AV Vmean:          126.000 cm/s AV VTI:            0.365 m AV Peak Grad:      15.2 mmHg AV Mean Grad:      8.0 mmHg LVOT Vmax:         144.00 cm/s LVOT Vmean:        89.700 cm/s LVOT VTI:          0.257 m LVOT/AV VTI ratio: 0.70 AI PHT:            369 msec  AORTA Ao Root diam: 3.60 cm Ao Asc diam:  3.30 cm MITRAL VALVE MV Area (PHT): 4.86 cm?     SHUNTS MV Decel Time: 156 msec     Systemic VTI:  0.26 m MV E velocity: 106.00 cm/s  Systemic Diam: 2.30 cm MV A velocity: 72.80 cm/s MV E/A ratio:  1.46 Phineas Inches Electronically signed by Phineas Inches Signature Date/Time: 12/09/2021/1:04:43 PM    Final    ? ? ? ?IMP/PLAN ?IMP/PLAN; ? ?#39 71 year old male admitted with acute GI bleed/melena, hemorrhagic shock and acute kidney injury. ? ?EGD 12/09/2021-with finding of a Dieulafoy lesion in the duodenum with stigmata of recent bleeding, injected with epi, treated with BiCap and then Endo Clip in x3 for hemostasis ? ?No active bleeding since though hemoglobin did drift from 7.4 yesterday to 6.9 today.  Patient is being transfused 1 unit today ? ?On PPI infusion ? ?#2 acute kidney injury/renal failure-nephrology on board felt secondary to ATN, parameters much improved ?#3 hemorrhagic shock-patient has been stable but still on low-dose Levophed ?#4 COPD ?5.  Peripheral arterial disease status post bypass ?6.  History of prostate cancer status post TURP ? ?Plan; soft diet today ?Continue PPI infusion today then convert to twice daily oral PPI tomorrow ?Continue to trend hemoglobin, no evidence for recurrent bleeding at present. ? ? ? ? ? ?Principal  Problem: ?  Hemorrhagic shock ([place]) ?Active Problems: ?  Acute renal failure ([place]) ?  Acute upper GI bleeding ? ? ? ? LOS: 3 days  ? ?Amy Esterwood  PA-C4/07/2022, 8:54 AM ? ?GI ATTENDING ? ?Interval histor

## 2021-12-12 DIAGNOSIS — D62 Acute posthemorrhagic anemia: Secondary | ICD-10-CM | POA: Diagnosis not present

## 2021-12-12 DIAGNOSIS — E870 Hyperosmolality and hypernatremia: Secondary | ICD-10-CM

## 2021-12-12 DIAGNOSIS — K3182 Dieulafoy lesion (hemorrhagic) of stomach and duodenum: Secondary | ICD-10-CM | POA: Diagnosis not present

## 2021-12-12 DIAGNOSIS — J449 Chronic obstructive pulmonary disease, unspecified: Secondary | ICD-10-CM | POA: Diagnosis not present

## 2021-12-12 DIAGNOSIS — K922 Gastrointestinal hemorrhage, unspecified: Secondary | ICD-10-CM | POA: Diagnosis not present

## 2021-12-12 DIAGNOSIS — R578 Other shock: Secondary | ICD-10-CM | POA: Diagnosis not present

## 2021-12-12 DIAGNOSIS — E876 Hypokalemia: Secondary | ICD-10-CM

## 2021-12-12 DIAGNOSIS — N179 Acute kidney failure, unspecified: Secondary | ICD-10-CM | POA: Diagnosis not present

## 2021-12-12 DIAGNOSIS — D696 Thrombocytopenia, unspecified: Secondary | ICD-10-CM

## 2021-12-12 DIAGNOSIS — I10 Essential (primary) hypertension: Secondary | ICD-10-CM

## 2021-12-12 LAB — BASIC METABOLIC PANEL
Anion gap: 5 (ref 5–15)
BUN: 56 mg/dL — ABNORMAL HIGH (ref 8–23)
CO2: 27 mmol/L (ref 22–32)
Calcium: 7.5 mg/dL — ABNORMAL LOW (ref 8.9–10.3)
Chloride: 116 mmol/L — ABNORMAL HIGH (ref 98–111)
Creatinine, Ser: 2.74 mg/dL — ABNORMAL HIGH (ref 0.61–1.24)
GFR, Estimated: 24 mL/min — ABNORMAL LOW (ref >60)
Glucose, Bld: 93 mg/dL (ref 70–99)
Potassium: 3.8 mmol/L (ref 3.5–5.1)
Sodium: 148 mmol/L — ABNORMAL HIGH (ref 135–145)

## 2021-12-12 LAB — CBC
HCT: 25.1 % — ABNORMAL LOW (ref 39.0–52.0)
Hemoglobin: 8.5 g/dL — ABNORMAL LOW (ref 13.0–17.0)
MCH: 32 pg (ref 26.0–34.0)
MCHC: 33.9 g/dL (ref 30.0–36.0)
MCV: 94.4 fL (ref 80.0–100.0)
Platelets: 101 10*3/uL — ABNORMAL LOW (ref 150–400)
RBC: 2.66 MIL/uL — ABNORMAL LOW (ref 4.22–5.81)
RDW: 14.6 % (ref 11.5–15.5)
WBC: 8.6 10*3/uL (ref 4.0–10.5)
nRBC: 0 % (ref 0.0–0.2)

## 2021-12-12 LAB — MAGNESIUM: Magnesium: 1.6 mg/dL — ABNORMAL LOW (ref 1.7–2.4)

## 2021-12-12 MED ORDER — PANTOPRAZOLE SODIUM 40 MG PO TBEC
40.0000 mg | DELAYED_RELEASE_TABLET | Freq: Two times a day (BID) | ORAL | Status: DC
  Administered 2021-12-12: 40 mg via ORAL
  Filled 2021-12-12: qty 1

## 2021-12-12 MED ORDER — PANTOPRAZOLE SODIUM 40 MG PO TBEC
40.0000 mg | DELAYED_RELEASE_TABLET | Freq: Every day | ORAL | Status: DC
  Administered 2021-12-13: 40 mg via ORAL
  Filled 2021-12-12: qty 1

## 2021-12-12 MED ORDER — MAGNESIUM SULFATE 2 GM/50ML IV SOLN
2.0000 g | Freq: Once | INTRAVENOUS | Status: AC
  Administered 2021-12-12: 2 g via INTRAVENOUS
  Filled 2021-12-12: qty 50

## 2021-12-12 MED ORDER — DEXTROSE 5 % IV SOLN: INTRAVENOUS | Status: DC

## 2021-12-12 NOTE — Assessment & Plan Note (Addendum)
Mg 1.5. Received IV magnesium sulfate 1 g prior to discharge. ?

## 2021-12-12 NOTE — Assessment & Plan Note (Addendum)
Recent Labs  ?  12/08/21 ?2341 12/09/21 ?0400 12/09/21 ?1300 12/09/21 ?1800 12/09/21 ?2346 12/10/21 ?2778 12/11/21 ?2423 12/11/21 ?1459 12/12/21 ?0429 12/13/21 ?0439  ?HGB 8.1* 6.2* 6.8* 8.0* 7.6* 7.4* 6.9* 8.3* 8.5* 8.4*  ?S/p 4 units of transfusion, endoscopic treatment with epi injection, BiCap and Endo Clip being ?H&H stable.  Tolerated soft diet.  ?-Cleared for discharge on p.o. Protonix for outpatient follow-up. ?-Advised against NSAID and discontinued low-dose aspirin. ?-Recheck CBC in 1 to 2 weeks ?-Start p.o. iron sulfate in about a week ?

## 2021-12-12 NOTE — Assessment & Plan Note (Signed)
35-pack-year history.  Reports quitting 2 weeks ago. ?-Congratulated ?

## 2021-12-12 NOTE — Progress Notes (Addendum)
Patient ID: Joshua Curtis, male   DOB: 11-Mar-1951, 71 y.o.   MRN: [id number] ? ? ? Progress Note ? ? Subjective  ? Day # 4 ?CC; acute upper GI bleed/Dieulafoy lesion duodenum/hemorrhagic shock/acute kidney injury ? ?PPI infusion discontinued, converted to twice daily PPI ? ?Hemoglobin 8.5 stable/platelets 101 improving ? ?Patient sitting at side of the bed, no GI complaints No abdominal pain or discomfort, would like solid food ?He had a small bowel movement this morning for the first time over the past couple of days some old dark blood noted ? ? Objective  ? ?Vital signs in last 24 hours: ?Temp:  [97.9 ?F (36.6 ?C)-98.8 ?F (37.1 ?C)] 97.9 ?F (36.6 ?C) (04/12 0623) ?Pulse Rate:  [60-93] 66 (04/12 0600) ?Resp:  [14-27] 16 (04/12 0600) ?BP: (102-149)/(44-81) 149/60 (04/12 0600) ?SpO2:  [95 %-100 %] 99 % (04/12 0600) ?Weight:  [65 kg] 65 kg (04/12 0500) ?Last BM Date : 12/11/21 ?General:    Older African-American male in NAD ?Heart:  Regular rate and rhythm; no murmurs ?Lungs: Respirations even and unlabored, lungs CTA bilaterally ?Abdomen:  Soft, nontender and nondistended. Normal bowel sounds. ?Extremities:  Without edema. ?Neurologic:  Alert and oriented,  grossly normal neurologically. ?Psych:  Cooperative. Normal mood and affect. ? ?Intake/Output from previous day: ?04/11 0701 - 04/12 0700 ?In: 1593.3 [I.V.:1036.2; Blood:431.5; IV Piggyback:125.6] ?Out: 1345 [JSEGB:1517] ?Intake/Output this shift: ?No intake/output data recorded. ? ?Lab Results: ?Recent Labs  ?  12/11/21 ?0624 12/11/21 ?1459 12/12/21 ?0429  ?WBC 7.5 7.9 8.6  ?HGB 6.9* 8.3* 8.5*  ?HCT 19.9* 24.2* 25.1*  ?PLT 95* 95* 101*  ? ?BMET ?Recent Labs  ?  12/10/21 ?6160 12/11/21 ?7371 12/12/21 ?0429  ?NA 148* 146* 148*  ?K 2.9* 3.3* 3.8  ?CL 110 110 116*  ?CO2 '30 31 27  '$ ?GLUCOSE 146* 93 93  ?BUN 143* 89* 56*  ?CREATININE 5.70* 3.63* 2.74*  ?CALCIUM 7.3* 7.3* 7.5*  ? ?LFT ?Recent Labs  ?  12/10/21 ?0626  ?PROT 4.7*  ?ALBUMIN 2.5*  ?AST 14*  ?ALT 10   ?ALKPHOS 21*  ?BILITOT 0.5  ? ?PT/INR ?Recent Labs  ?  12/10/21 ?9485  ?LABPROT 15.7*  ?INR 1.3*  ? ? ?Studies/Results: ?No results found. ? ? ? ? Assessment / Plan:   ? ?#62 71 year old African-American male with acute GI hemorrhage secondary to a Dieulafoy lesion/ulcer in the second portion of the duodenum, associated with hemorrhagic shock and acute kidney injury ?Status post EGD with endoscopic therapy for hemostasis with epi injection, BiCap and then Endo Clipping ?Patient has remained stable, hemoglobin stable at 8.5 ? ?#2 cute kidney injury/acute renal failure secondary to ATN-grandmothers continue to improve, nephrology following ?#3 anemia secondary to GI hemorrhage-stable ?#4 hypertension ?#5.  COPD ?#6.  Peripheral arterial disease ? ?Plan; okay to advance to regular diet ?We will change PPI to once daily Protonix 40 mg every morning ?Patient is stable from GI perspective, will sign off, available if needed ?Patient advised again today to discontinue all NSAID use, he had been taking meloxicam, he will use Tylenol as needed. ? ? ?Principal Problem: ?  Hemorrhagic shock ([place]) ?Active Problems: ?  Acute renal failure ([place]) ?  Acute upper GI bleeding ?  Hypokalemia ?  Dieulafoy lesion of duodenum ?  Acute blood loss anemia ? ? ? ? LOS: 4 days  ? ?Amy Esterwood PA-C 12/12/2021, 9:05 AM ? ?GI ATTENDING ? ?Interval history data reviewed.  Patient seen and examined.  Agree with interval progress note.  Agree with plans to advance diet, changed to p.o. PPI, avoid NSAIDs.  Further plans from GI perspective.  We will sign off. ? ?Johnwilliam Shepperson N. Geri [place]in[place]., M.D. ?[place] ?Division of Gastroenterology  ?  ?

## 2021-12-12 NOTE — Hospital Course (Addendum)
71 year old M with PMH of COPD, HTN, PAD, former smoker and former alcohol abuse presenting with progressive abdominal pain, dizziness and melena and admitted to ICU with hemorrhagic shock in the setting of GI bleed likely from [facility] lesion requiring ICU stay for vasopressors.  He had EGD with epinephrine injection, cautery and 3 endoclips.  He had about 4 units of blood transfusion.  He also had severe AKI for which he was followed by nephrology. Eventually, hemoglobin remained stable and AKI improved tremendously.  He came off vasopressors.  Transferred to [facility] hospitalist service on 12/12/2021.   ? ?Patient's hemoglobin remained stable.  He tolerated soft diet.  He is cleared for discharge on p.o. Protonix by gastroenterology for outpatient follow-up. ? ?In regards to AKI, renal function continued to improve.  He is cleared for discharge by nephrology for outpatient follow-up. ? ?Of note, patient remained normotensive off home antihypertensive meds, which were discontinued on discharge.  ?

## 2021-12-12 NOTE — Progress Notes (Signed)
?PROGRESS NOTE ? ?Joshua Curtis FTD:[id number] DOB: Jan 23, 1951  ? ?PCP: Biagio Borg, MD ? ?Patient is from: Home.  Lives with family.  Independently ambulates at baseline. ? ?DOA: 12/08/2021 LOS: 4 ? ?Chief complaints ?Chief Complaint  ?Patient presents with  ? Abdominal Pain  ? Nausea  ? Emesis  ?  ? ?Brief Narrative / Interim history: ?71 year old M with PMH of COPD, HTN, PAD, former smoker and former alcohol abuse presenting with progressive abdominal pain, dizziness and melena and admitted to ICU with hemorrhagic shock in the setting of GI bleed likely from [facility] lesion requiring ICU stay for vasopressors.  He had EGD with epinephrine injection, cautery and 3 endoclips.  He had about 4 units of blood transfusion.  He also had severe AKI for which he was followed by nephrology. Eventually, hemoglobin remained stable and AKI improved tremendously.  He came off vasopressors.  Transferred to [facility] hospitalist service on 12/12/2021.    ? ?Subjective: ?Seen and examined earlier this morning.  No major events overnight of this morning.  No complaints.  He says he had a bowel movement this morning.  Not sure if there is a blood in the stool or not.  He denies chest pain, dyspnea, dizziness or lightheadedness.  Likes to eat soft diet today. ? ?Objective: ?Vitals:  ? 12/12/21 0900 12/12/21 1100 12/12/21 1131 12/12/21 1426  ?BP: 140/62 (!) 143/69  131/67  ?Pulse: 70 70  64  ?Resp: '20 20  18  '$ ?Temp:   98.3 ?F (36.8 ?C) 98.4 ?F (36.9 ?C)  ?TempSrc:   Oral Oral  ?SpO2: 98% 100%  100%  ?Weight:      ? ? ?Examination: ? ?GENERAL: No apparent distress.  Nontoxic. ?HEENT: MMM.  Vision and hearing grossly intact.  ?NECK: Supple.  No apparent JVD.  ?RESP:  No IWOB.  Fair aeration bilaterally. ?CVS:  RRR. Heart sounds normal.  ?ABD/GI/GU: BS+. Abd soft, NTND.  ?MSK/EXT:  Moves extremities. No apparent deformity. No edema.  ?SKIN: no apparent skin lesion or wound ?NEURO: Awake, alert and oriented appropriately.  No apparent focal  neuro deficit. ?PSYCH: Calm. Normal affect.  ? ?Procedures:  ?4/9-EGD ? ?Microbiology summarized: ?MRSA PCR screen negative. ? ?Assessment and Plan: ?* Hemorrhagic shock due to GI bleed likely from [facility] lesion ?Recent Labs  ?  12/08/21 ?2037 12/08/21 ?2341 12/09/21 ?0400 12/09/21 ?1300 12/09/21 ?1800 12/09/21 ?2346 12/10/21 ?0623 12/11/21 ?7628 12/11/21 ?1459 12/12/21 ?0429  ?HGB 8.0* 8.1* 6.2* 6.8* 8.0* 7.6* 7.4* 6.9* 8.3* 8.5*  ?S/p 4 units of transfusion, endoscopic treatment with epi injection, BiCap and Endo Clip being ?H&H stable.  Continue p.o. Protonix 40 mg twice daily ?Advised against NSAID. ?Soft diet ?Monitor H&H ? ?Severe AKI with azotemia ?Recent Labs  ?  04/13/21 ?0909 04/14/21 ?0000 09/07/21 ?1046 11/29/21 ?1447 12/08/21 ?1018 12/08/21 ?2037 12/09/21 ?0400 12/10/21 ?3151 12/11/21 ?7616 12/12/21 ?0429  ?BUN 17 17 41* 177* 274* 230* 211* 143* 89* 56*  ?CREATININE 1.22 1.10 1.65* 13.85* 15.70* 11.09* 10.24* 5.70* 3.63* 2.74*  ?Had extensive work-up but unrevealing.  Felt to be due to poor p.o. intake, NSAID use, ACE inhibitors and GI loss from nausea and vomiting.  Improving. ?-Appreciate help by nephrology ? ? ?Thrombocytopenia ([facility]) ?Likely reactive.  Improving. ? ?Hypernatremia ?Na 148. ?-Start D5 infusion ?-Recheck in the morning ? ?Chronic obstructive pulmonary disease (COPD) ([place]) ?35-pack-year history.  Reports quitting 2 weeks ago. ?-Congratulated ? ?Essential hypertension ?Currently normotensive off home antihypertensive meds. ?-Continue monitoring ? ?Hypomagnesemia ?Mg 1.6. ?-IV  magnesium sulfate 2 g x 1 ? ? ? ?  ?DVT prophylaxis:  ?SCDs Start: 12/08/21 1552 ? ?Code Status: Full code ?Family Communication: Patient and/or RN. Available if any question.  ?Level of care: Med-Surg ?Status is: Inpatient ?Remains inpatient appropriate because: GI bleed, AKI and hypernatremia ? ? ?Final disposition: Likely home in the next 24 to 48 hours. ?Consultants:   ?Pulmonology ?Gastroenterology ?Nephrology ? ?Sch Meds:  ?Scheduled Meds: ? Chlorhexidine Gluconate Cloth  6 each Topical Daily  ? mouth rinse  15 mL Mouth Rinse BID  ? [START ON 12/13/2021] pantoprazole  40 mg Oral Daily  ? ?Continuous Infusions: ? sodium chloride 250 mL (12/12/21 9924)  ? sodium chloride Stopped (12/11/21 1540)  ? dextrose 75 mL/hr at 12/12/21 0906  ? ?PRN Meds:.albuterol, docusate sodium, ondansetron (ZOFRAN) IV, polyethylene glycol ? ?Antimicrobials: ?Anti-infectives (From admission, onward)  ? ? None  ? ?  ? ? ? ?I have personally reviewed the following labs and images: ?CBC: ?Recent Labs  ?Lab 12/09/21 ?1300 12/09/21 ?1800 12/09/21 ?2346 12/10/21 ?2683 12/11/21 ?4196 12/11/21 ?1459 12/12/21 ?0429  ?WBC 12.8* 10.1 9.0 8.8 7.5 7.9 8.6  ?NEUTROABS 10.9* 8.3*  --   --   --   --   --   ?HGB 6.8* 8.0* 7.6* 7.4* 6.9* 8.3* 8.5*  ?HCT 19.4* 22.8* 21.2* 21.2* 19.9* 24.2* 25.1*  ?MCV 90.2 89.1 88.3 88.7 92.1 93.4 94.4  ?PLT 99* 103* 98* 95* 95* 95* 101*  ? ?BMP &GFR ?Recent Labs  ?Lab 12/08/21 ?1018 12/08/21 ?2037 12/09/21 ?0400 12/10/21 ?2229 12/11/21 ?7989 12/12/21 ?0429  ?NA 139 144 143 148* 146* 148*  ?K 4.7 4.3 3.6 2.9* 3.3* 3.8  ?CL 111 121* 117* 110 110 116*  ?CO2 13* 11* 15* '30 31 27  '$ ?GLUCOSE 155* 132* 167* 146* 93 93  ?BUN 274* 230* 211* 143* 89* 56*  ?CREATININE 15.70* 11.09* 10.24* 5.70* 3.63* 2.74*  ?CALCIUM 8.2* 7.5* 7.3* 7.3* 7.3* 7.5*  ?MG 2.2  --  1.7 1.6* 1.7 1.6*  ?PHOS  --   --  4.7* 3.5  --   --   ? ?Estimated Creatinine Clearance: 23.7 mL/min (A) (by C-G formula based on SCr of 2.74 mg/dL (H)). ?Liver & Pancreas: ?Recent Labs  ?Lab 12/08/21 ?1018 12/10/21 ?2119  ?AST 9* 14*  ?ALT 13 10  ?ALKPHOS 30* 21*  ?BILITOT 0.3 0.5  ?PROT 5.7* 4.7*  ?ALBUMIN 3.0* 2.5*  ? ?Recent Labs  ?Lab 12/08/21 ?1018  ?LIPASE 47  ? ?No results for input(s): AMMONIA in the last 168 hours. ?Diabetic: ?No results for input(s): HGBA1C in the last 72 hours. ?Recent Labs  ?Lab 12/10/21 ?1214  ?GLUCAP 120*   ? ?Cardiac Enzymes: ?No results for input(s): CKTOTAL, CKMB, CKMBINDEX, TROPONINI in the last 168 hours. ?No results for input(s): PROBNP in the last 8760 hours. ?Coagulation Profile: ?Recent Labs  ?Lab 12/08/21 ?1050 12/10/21 ?4174  ?INR 1.5* 1.3*  ? ?Thyroid Function Tests: ?No results for input(s): TSH, T4TOTAL, FREET4, T3FREE, THYROIDAB in the last 72 hours. ?Lipid Profile: ?No results for input(s): CHOL, HDL, LDLCALC, TRIG, CHOLHDL, LDLDIRECT in the last 72 hours. ?Anemia Panel: ?No results for input(s): VITAMINB12, FOLATE, FERRITIN, TIBC, IRON, RETICCTPCT in the last 72 hours. ?Urine analysis: ?   ?Component Value Date/Time  ? COLORURINE YELLOW 12/08/2021 1342  ? APPEARANCEUR HAZY (A) 12/08/2021 1342  ? LABSPEC 1.013 12/08/2021 1342  ? PHURINE 5.0 12/08/2021 1342  ? GLUCOSEU NEGATIVE 12/08/2021 1342  ? GLUCOSEU NEGATIVE 11/29/2021 1447  ? HGBUR NEGATIVE  12/08/2021 1342  ? BILIRUBINUR NEGATIVE 12/08/2021 1342  ? KETONESUR NEGATIVE 12/08/2021 1342  ? PROTEINUR NEGATIVE 12/08/2021 1342  ? UROBILINOGEN 0.2 11/29/2021 1447  ? NITRITE NEGATIVE 12/08/2021 1342  ? LEUKOCYTESUR NEGATIVE 12/08/2021 1342  ? ?Sepsis Labs: ?Invalid input(s): PROCALCITONIN, LACTICIDVEN ? ?Microbiology: ?Recent Results (from the past 240 hour(s))  ?MRSA Next Gen by PCR, Nasal     Status: None  ? Collection Time: 12/08/21  5:47 PM  ? Specimen: Nasal Mucosa; Nasal Swab  ?Result Value Ref Range Status  ? MRSA by PCR Next Gen NOT DETECTED NOT DETECTED Final  ?  Comment: (NOTE) ?The GeneXpert MRSA Assay (FDA approved for NASAL specimens only), ?is one component of a comprehensive MRSA colonization surveillance ?program. It is not intended to diagnose MRSA infection nor to guide ?or monitor treatment for MRSA infections. ?Test performance is not FDA approved in patients less than 2 years ?old. ?Performed at [facility], [address] ?[place], [place] [address] ?  ? ? ?Radiology Studies: ?No results found. ? ? ? ?Taytem Ghattas T.  Aniyah Nobis ?[facility] ? ?If 7PM-7AM, please contact night-coverage ?[email or web address] ?12/12/2021, 3:53 PM   ?

## 2021-12-12 NOTE — Assessment & Plan Note (Addendum)
Recent Labs  ?  04/14/21 ?0000 09/07/21 ?1046 11/29/21 ?1447 12/08/21 ?1018 12/08/21 ?2037 12/09/21 ?0400 12/10/21 ?1518 12/11/21 ?3437 12/12/21 ?3578 12/13/21 ?0439  ?BUN 17 41* 177* 274* 230* 211* 143* 89* 56* 36*  ?CREATININE 1.10 1.65* 13.85* 15.70* 11.09* 10.24* 5.70* 3.63* 2.74* 2.25*  ?Had extensive work-up but unrevealing.  Felt to be due to poor p.o. intake, NSAID use, ACE inhibitors and GI loss from nausea and vomiting.  Improving. ?-Discontinued benazepril and HCTZ on discharge. ?-Recheck renal function in 1 to 2 weeks ? ?

## 2021-12-12 NOTE — Assessment & Plan Note (Addendum)
Likely reactive.  Improving. ?-Recheck at follow-up. ?

## 2021-12-12 NOTE — Assessment & Plan Note (Addendum)
Currently normotensive off home antihypertensive meds. ?Home medications discontinued. ?Reassess blood pressure at follow-up ?

## 2021-12-12 NOTE — Progress Notes (Signed)
eLink Physician-Brief Progress Note ?Patient Name: MASAO JUNKER ?DOB: April 08, 1951 ?MRN: [id number] ? ? ?Date of Service ? 12/12/2021  ?HPI/Events of Note ? Hypomagnesemia - Mg++ = 1.6 and Creatinine = 2.74.  ?eICU Interventions ? Will replace Mg++. ?  ? ? ? ?Intervention Category ?Major Interventions: Electrolyte abnormality - evaluation and management ? ?Jaylenn Altier Cornelia Copa ?12/12/2021, 5:14 AM ?

## 2021-12-12 NOTE — Assessment & Plan Note (Addendum)
Resolved

## 2021-12-13 DIAGNOSIS — I10 Essential (primary) hypertension: Secondary | ICD-10-CM | POA: Diagnosis not present

## 2021-12-13 DIAGNOSIS — N179 Acute kidney failure, unspecified: Secondary | ICD-10-CM | POA: Diagnosis not present

## 2021-12-13 DIAGNOSIS — R578 Other shock: Secondary | ICD-10-CM | POA: Diagnosis not present

## 2021-12-13 DIAGNOSIS — K922 Gastrointestinal hemorrhage, unspecified: Secondary | ICD-10-CM | POA: Diagnosis not present

## 2021-12-13 LAB — TYPE AND SCREEN
ABO/RH(D): O POS
Antibody Screen: NEGATIVE
Unit division: 0
Unit division: 0
Unit division: 0
Unit division: 0
Unit division: 0

## 2021-12-13 LAB — CBC
HCT: 24.8 % — ABNORMAL LOW (ref 39.0–52.0)
Hemoglobin: 8.4 g/dL — ABNORMAL LOW (ref 13.0–17.0)
MCH: 32.1 pg (ref 26.0–34.0)
MCHC: 33.9 g/dL (ref 30.0–36.0)
MCV: 94.7 fL (ref 80.0–100.0)
Platelets: 124 10*3/uL — ABNORMAL LOW (ref 150–400)
RBC: 2.62 MIL/uL — ABNORMAL LOW (ref 4.22–5.81)
RDW: 14.5 % (ref 11.5–15.5)
WBC: 8.9 10*3/uL (ref 4.0–10.5)
nRBC: 0 % (ref 0.0–0.2)

## 2021-12-13 LAB — RENAL FUNCTION PANEL
Albumin: 2.5 g/dL — ABNORMAL LOW (ref 3.5–5.0)
Anion gap: 3 — ABNORMAL LOW (ref 5–15)
BUN: 36 mg/dL — ABNORMAL HIGH (ref 8–23)
CO2: 25 mmol/L (ref 22–32)
Calcium: 7.3 mg/dL — ABNORMAL LOW (ref 8.9–10.3)
Chloride: 114 mmol/L — ABNORMAL HIGH (ref 98–111)
Creatinine, Ser: 2.25 mg/dL — ABNORMAL HIGH (ref 0.61–1.24)
GFR, Estimated: 31 mL/min — ABNORMAL LOW (ref >60)
Glucose, Bld: 124 mg/dL — ABNORMAL HIGH (ref 70–99)
Phosphorus: 2.4 mg/dL — ABNORMAL LOW (ref 2.5–4.6)
Potassium: 3.6 mmol/L (ref 3.5–5.1)
Sodium: 142 mmol/L (ref 135–145)

## 2021-12-13 LAB — BPAM RBC
Blood Product Expiration Date: 202305042359
Blood Product Expiration Date: 202305112359
Blood Product Expiration Date: 202305112359
Blood Product Expiration Date: 202305122359
Blood Product Expiration Date: 202305122359
ISSUE DATE / TIME: 202304080313
ISSUE DATE / TIME: 202304081223
ISSUE DATE / TIME: 202304090816
ISSUE DATE / TIME: 202304091342
ISSUE DATE / TIME: 202304110943
Unit Type and Rh: 5100
Unit Type and Rh: 5100
Unit Type and Rh: 5100
Unit Type and Rh: 5100
Unit Type and Rh: 5100

## 2021-12-13 LAB — MAGNESIUM: Magnesium: 1.5 mg/dL — ABNORMAL LOW (ref 1.7–2.4)

## 2021-12-13 MED ORDER — POLYETHYLENE GLYCOL 3350 17 GM/SCOOP PO POWD: 17.0000 g | Freq: Two times a day (BID) | ORAL | 0 refills | Status: DC | PRN

## 2021-12-13 MED ORDER — FERROUS SULFATE 325 (65 FE) MG PO TBEC: 325.0000 mg | DELAYED_RELEASE_TABLET | Freq: Two times a day (BID) | ORAL | 3 refills | Status: DC

## 2021-12-13 MED ORDER — MAGNESIUM SULFATE IN D5W 1-5 GM/100ML-% IV SOLN
1.0000 g | Freq: Once | INTRAVENOUS | Status: AC
  Administered 2021-12-13: 1 g via INTRAVENOUS
  Filled 2021-12-13: qty 100

## 2021-12-13 MED ORDER — PANTOPRAZOLE SODIUM 40 MG PO TBEC
40.0000 mg | DELAYED_RELEASE_TABLET | Freq: Two times a day (BID) | ORAL | 0 refills | Status: DC
Start: 2021-12-13 — End: 2021-12-14

## 2021-12-13 NOTE — Discharge Summary (Signed)
? ?Physician Discharge Summary  ?Joshua Curtis TSV:[id number] DOB: June 27, 1951 DOA: 12/08/2021 ? ?PCP: Biagio Borg, MD ? ?Admit date: 12/08/2021 ?Discharge date: 12/13/2021 ?Admitted From: Home. ?Disposition: Home ?Recommendations for Outpatient Follow-up:  ?Follow ups as below. ?Please obtain CBC/BMP/Mag at follow up ?Reassess blood pressure at follow-up. ?Please follow up on the following pending results: None ? ?Home Health: Not indicated. ?Equipment/Devices: Not indicated ? ?Discharge Condition: Stable ?CODE STATUS: Full code ? Follow-up Information   ? ? Biagio Borg, MD. Schedule an appointment as soon as possible for a visit in 1 week(s).   ?Specialties: Internal Medicine, Radiology ?Contact information: ?[place] [address] ?[phone] ? ? ?  ?  ? ?  ?  ? ?  ? ? ?Hospital course ?71 year old M with PMH of COPD, HTN, PAD, former smoker and former alcohol abuse presenting with progressive abdominal pain, dizziness and melena and admitted to ICU with hemorrhagic shock in the setting of GI bleed likely from [facility] lesion requiring ICU stay for vasopressors.  He had EGD with epinephrine injection, cautery and 3 endoclips.  He had about 4 units of blood transfusion.  He also had severe AKI for which he was followed by nephrology. Eventually, hemoglobin remained stable and AKI improved tremendously.  He came off vasopressors.  Transferred to [facility]ist service on 12/12/2021.   ? ?Patient's hemoglobin remained stable.  He tolerated soft diet.  He is cleared for discharge on p.o. Protonix by gastroenterology for outpatient follow-up. ? ?In regards to AKI, renal function continued to improve.  He is cleared for discharge by nephrology for outpatient follow-up. ? ?Of note, patient remained normotensive off home antihypertensive meds, which were discontinued on discharge.   ? ?See individual problem list below for more on hospital course. ? ?Problems addressed during this  hospitalization ?Problem  ?Hemorrhagic shock due to GI bleed likely from [facility] lesion  ?Severe AKI with azotemia  ?Essential Hypertension  ?Chronic Obstructive Pulmonary Disease (Copd) ([facility])  ?Thrombocytopenia ([facility])  ?Hypomagnesemia  ?Hypernatremia (Resolved)  ?Hypokalemia (Resolved)  ?  ?Assessment and Plan: ?* Hemorrhagic shock due to GI bleed likely from [facility] lesion ?Recent Labs  ?  12/08/21 ?2341 12/09/21 ?0400 12/09/21 ?1300 12/09/21 ?1800 12/09/21 ?2346 12/10/21 ?4562 12/11/21 ?5638 12/11/21 ?1459 12/12/21 ?0429 12/13/21 ?0439  ?HGB 8.1* 6.2* 6.8* 8.0* 7.6* 7.4* 6.9* 8.3* 8.5* 8.4*  ?S/p 4 units of transfusion, endoscopic treatment with epi injection, BiCap and Endo Clip being ?H&H stable.  Tolerated soft diet.  ?-Cleared for discharge on p.o. Protonix for outpatient follow-up. ?-Advised against NSAID and discontinued low-dose aspirin. ?-Recheck CBC in 1 to 2 weeks ?-Start p.o. iron sulfate in about a week ? ?Severe AKI with azotemia ?Recent Labs  ?  04/14/21 ?0000 09/07/21 ?1046 11/29/21 ?1447 12/08/21 ?1018 12/08/21 ?2037 12/09/21 ?0400 12/10/21 ?9373 12/11/21 ?4287 12/12/21 ?6811 12/13/21 ?0439  ?BUN 17 41* 177* 274* 230* 211* 143* 89* 56* 36*  ?CREATININE 1.10 1.65* 13.85* 15.70* 11.09* 10.24* 5.70* 3.63* 2.74* 2.25*  ?Had extensive work-up but unrevealing.  Felt to be due to poor p.o. intake, NSAID use, ACE inhibitors and GI loss from nausea and vomiting.  Improving. ?-Discontinued benazepril and HCTZ on discharge. ?-Recheck renal function in 1 to 2 weeks ? ? ?Thrombocytopenia ([place]) ?Likely reactive.  Improving. ?-Recheck at follow-up. ? ?Chronic obstructive pulmonary disease (COPD) ([place]) ?35-pack-year history.  Reports quitting 2 weeks ago. ?-Congratulated ? ?Essential hypertension ?Currently normotensive off home antihypertensive meds. ?Home medications discontinued. ?Reassess blood pressure at follow-up ? ?  Hypernatremia-resolved as of 12/13/2021 ?Resolved. ? ?Hypomagnesemia ?Mg 1.5. Received IV  magnesium sulfate 1 g prior to discharge. ? ? ? ? ? ?Vital signs ?Vitals:  ? 12/12/21 1131 12/12/21 1426 12/12/21 2141 12/13/21 0640  ?BP:  131/67 (!) 115/94 96/78  ?Pulse:  64 79 74  ?Temp: 98.3 ?F (36.8 ?C) 98.4 ?F (36.9 ?C) 99.4 ?F (37.4 ?C) 99.1 ?F (37.3 ?C)  ?Resp:  '18 16 16  '$ ?Weight:      ?SpO2:  100% 99% 100%  ?TempSrc: Oral Oral Oral Oral  ?BMI (Calculated):      ?  ? ?Discharge exam ? ?GENERAL: No apparent distress.  Nontoxic. ?HEENT: MMM.  Vision and hearing grossly intact.  ?NECK: Supple.  No apparent JVD.  ?RESP:  No IWOB.  Fair aeration bilaterally. ?CVS:  RRR. Heart sounds normal.  ?ABD/GI/GU: BS+. Abd soft, NTND.  ?MSK/EXT:  Moves extremities. No apparent deformity. No edema.  ?SKIN: no apparent skin lesion or wound ?NEURO: Awake and alert. Oriented appropriately.  No apparent focal neuro deficit. ?PSYCH: Calm. Normal affect.  ? ?Discharge Instructions ?Discharge Instructions   ? ? Call MD for:  difficulty breathing, headache or visual disturbances   Complete by: As directed ?  ? Call MD for:  extreme fatigue   Complete by: As directed ?  ? Call MD for:  persistant dizziness or light-headedness   Complete by: As directed ?  ? Call MD for:  persistant nausea and vomiting   Complete by: As directed ?  ? Call MD for:  severe uncontrolled pain   Complete by: As directed ?  ? Diet - low sodium heart healthy   Complete by: As directed ?  ? Continue soft diet for the next 3 to 4 days.  ? Discharge instructions   Complete by: As directed ?  ? It has been a pleasure taking care of you! ? ?You were hospitalized due to gastrointestinal bleed for which you have been treated endoscopically and medically.  Your bleeding seems to have stopped.  We are discharging you on Protonix to reduce your risk of bleeding.  We recommend you avoid any over-the-counter pain medication other than plain Tylenol.  We have made some adjustment to your home medication during this hospitalization.  Please review your new medication  list and the directions on your medications before you take them. ? ?Follow-up with your primary care doctor in 1 to 2 weeks or sooner if needed.  Our gastroenterologist will arrange outpatient follow-up.  ? ?Congratulations on your success stopping smoking and drinking alcohol! ? ? ?Take care,  ? Increase activity slowly   Complete by: As directed ?  ? ?  ? ?Allergies as of 12/13/2021   ?No Known Allergies ?  ? ?  ?Medication List  ?  ? ?STOP taking these medications   ? ?amLODipine-benazepril 10-40 MG capsule ?Commonly known as: LOTREL ?  ?aspirin EC 81 MG tablet ?  ?hydrochlorothiazide 12.5 MG capsule ?Commonly known as: MICROZIDE ?  ? ?  ? ?TAKE these medications   ? ?ferrous sulfate 325 (65 FE) MG EC tablet ?Take 1 tablet (325 mg total) by mouth in the morning and at bedtime. ?Start taking on: December 20, 2021 ?What changed:  ?when to take this ?These instructions start on December 20, 2021. If you are unsure what to do until then, ask your doctor or other care provider. ?  ?pantoprazole 40 MG tablet ?Commonly known as: PROTONIX ?Take 1 tablet (40 mg total) by mouth 2 (  two) times daily. ?What changed: when to take this ?  ?polyethylene glycol powder 17 GM/SCOOP powder ?Commonly known as: MiraLax ?Take 17 g by mouth 2 (two) times daily as needed for moderate constipation or mild constipation. ?  ?rosuvastatin 40 MG tablet ?Commonly known as: Crestor ?Take 1 tablet (40 mg total) by mouth daily. ?What changed: when to take this ?  ?sucralfate 1 g tablet ?Commonly known as: Carafate ?Take 1 tablet (1 g total) by mouth 4 (four) times daily. ?  ? ?  ? ? ?Consultations: ?Pulmonology ?Gastroenterology ?Nephrology ? ?Procedures/Studies: ?12/09/2021-EGD 6-Normal esophagus. Gastritis. 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 Treatment not successful. Treated with bipolar cautery. Clips were placed. No specimens collected. ? ? ?CT ABDOMEN PELVIS WO CONTRAST ? ?Result Date: 12/08/2021 ?CLINICAL DATA:  Abdominal pain,  acute, nonlocalized EXAM: CT ABDOMEN AND PELVIS WITHOUT CONTRAST TECHNIQUE: Multidetector CT imaging of the abdomen and pelvis was performed following the standard protocol without IV contrast. RADIATION DOSE REDUCTION: This ex

## 2021-12-13 NOTE — Plan of Care (Signed)

## 2021-12-14 ENCOUNTER — Telehealth: Payer: Self-pay | Admitting: Internal Medicine

## 2021-12-14 MED ORDER — POLYETHYLENE GLYCOL 3350 17 GM/SCOOP PO POWD: 17.0000 g | Freq: Two times a day (BID) | ORAL | 0 refills | Status: DC | PRN

## 2021-12-14 MED ORDER — PANTOPRAZOLE SODIUM 40 MG PO TBEC: 40.0000 mg | DELAYED_RELEASE_TABLET | Freq: Two times a day (BID) | ORAL | 0 refills | Status: DC

## 2021-12-14 MED ORDER — FERROUS SULFATE 325 (65 FE) MG PO TBEC: 325.0000 mg | DELAYED_RELEASE_TABLET | Freq: Two times a day (BID) | ORAL | 0 refills | Status: DC

## 2021-12-14 MED ORDER — ROSUVASTATIN CALCIUM 40 MG PO TABS: 40.0000 mg | ORAL_TABLET | Freq: Every morning | ORAL | 3 refills | Status: DC

## 2021-12-14 MED ORDER — SUCRALFATE 1 G PO TABS: 1.0000 g | ORAL_TABLET | Freq: Four times a day (QID) | ORAL | 0 refills | Status: DC

## 2021-12-14 NOTE — Telephone Encounter (Signed)
Spoke to pt and wife, both at home ? ?Pt mentions fatigue but no other complaints, and is dissapointed to hear he should not return to work until after 1 wk f/u here next wk ? ?Wife concerned b/c [facility] states [facility]s not called in or ready for some reason.   Has no other concerns or upset today about his overall care, felt he had excellent care at [facility] ? ? ?A/P   ? ?GI bleed/AKI - will re-send all meds to [facility] [place], and will ask office to call to help make appt for 1 wk with me ? ?Cathlean Cower MD ? ? ? ?

## 2021-12-21 ENCOUNTER — Encounter: Payer: Self-pay | Admitting: Internal Medicine

## 2021-12-21 ENCOUNTER — Ambulatory Visit (INDEPENDENT_AMBULATORY_CARE_PROVIDER_SITE_OTHER): Payer: PPO | Admitting: Internal Medicine

## 2021-12-21 ENCOUNTER — Other Ambulatory Visit: Payer: Self-pay | Admitting: Internal Medicine

## 2021-12-21 VITALS — BP 122/60 | HR 87 | Temp 98.1°F | Ht 70.0 in | Wt 161.0 lb

## 2021-12-21 DIAGNOSIS — I10 Essential (primary) hypertension: Secondary | ICD-10-CM | POA: Diagnosis not present

## 2021-12-21 DIAGNOSIS — D62 Acute posthemorrhagic anemia: Secondary | ICD-10-CM

## 2021-12-21 DIAGNOSIS — F17200 Nicotine dependence, unspecified, uncomplicated: Secondary | ICD-10-CM

## 2021-12-21 DIAGNOSIS — Q231 Congenital insufficiency of aortic valve: Secondary | ICD-10-CM

## 2021-12-21 DIAGNOSIS — J309 Allergic rhinitis, unspecified: Secondary | ICD-10-CM

## 2021-12-21 DIAGNOSIS — N179 Acute kidney failure, unspecified: Secondary | ICD-10-CM | POA: Diagnosis not present

## 2021-12-21 LAB — HEPATIC FUNCTION PANEL
ALT: 74 U/L — ABNORMAL HIGH (ref 0–53)
AST: 43 U/L — ABNORMAL HIGH (ref 0–37)
Albumin: 3.4 g/dL — ABNORMAL LOW (ref 3.5–5.2)
Alkaline Phosphatase: 57 U/L (ref 39–117)
Bilirubin, Direct: 0.1 mg/dL (ref 0.0–0.3)
Total Bilirubin: 0.3 mg/dL (ref 0.2–1.2)
Total Protein: 5.9 g/dL — ABNORMAL LOW (ref 6.0–8.3)

## 2021-12-21 LAB — MAGNESIUM: Magnesium: 1.5 mg/dL (ref 1.5–2.5)

## 2021-12-21 LAB — CBC WITH DIFFERENTIAL/PLATELET
Basophils Absolute: 0.1 10*3/uL (ref 0.0–0.1)
Basophils Relative: 1.2 % (ref 0.0–3.0)
Eosinophils Absolute: 0.2 10*3/uL (ref 0.0–0.7)
Eosinophils Relative: 3.3 % (ref 0.0–5.0)
HCT: 25.1 % — ABNORMAL LOW (ref 39.0–52.0)
Hemoglobin: 8.6 g/dL — ABNORMAL LOW (ref 13.0–17.0)
Lymphocytes Relative: 24.5 % (ref 12.0–46.0)
Lymphs Abs: 1.2 10*3/uL (ref 0.7–4.0)
MCHC: 34.1 g/dL (ref 30.0–36.0)
MCV: 92 fl (ref 78.0–100.0)
Monocytes Absolute: 0.6 10*3/uL (ref 0.1–1.0)
Monocytes Relative: 11.9 % (ref 3.0–12.0)
Neutro Abs: 2.8 10*3/uL (ref 1.4–7.7)
Neutrophils Relative %: 59.1 % (ref 43.0–77.0)
Platelets: 259 10*3/uL (ref 150.0–400.0)
RBC: 2.73 Mil/uL — ABNORMAL LOW (ref 4.22–5.81)
RDW: 15.4 % (ref 11.5–15.5)
WBC: 4.8 10*3/uL (ref 4.0–10.5)

## 2021-12-21 LAB — BASIC METABOLIC PANEL
BUN: 13 mg/dL (ref 6–23)
CO2: 28 mEq/L (ref 19–32)
Calcium: 7.6 mg/dL — ABNORMAL LOW (ref 8.4–10.5)
Chloride: 110 mEq/L (ref 96–112)
Creatinine, Ser: 1.41 mg/dL (ref 0.40–1.50)
GFR: 51.12 mL/min — ABNORMAL LOW (ref >60.00)
Glucose, Bld: 95 mg/dL (ref 70–99)
Potassium: 3.3 mEq/L — ABNORMAL LOW (ref 3.5–5.1)
Sodium: 145 mEq/L (ref 135–145)

## 2021-12-21 MED ORDER — POLYSACCHARIDE IRON COMPLEX 150 MG PO CAPS: 150.0000 mg | ORAL_CAPSULE | Freq: Two times a day (BID) | ORAL | 1 refills | Status: DC

## 2021-12-21 NOTE — Patient Instructions (Addendum)
Ok to take the Niferex (iron) if you cant find the iron sulfate ? ?Please also take the Colace 100 mg twice per day with the iron to avoid constipation ? ?Please continue all other medications as before, and refills have been done if requested. ? ?Please have the pharmacy call with any other refills you may need. ? ?Please continue your efforts at being more active, low cholesterol diet, and weight control. ? ?You are otherwise up to date with prevention measures today. ? ?Please keep your appointments with your specialists as you may have planned ? ?Please go to the LAB at the blood drawing area for the tests to be done ?You will be contacted by phone if any changes need to be made immediately.  Otherwise, you will receive a letter about your results with an explanation, but please check with MyChart first. ? ?Please remember to sign up for MyChart if you have not done so, as this will be important to you in the future with finding out test results, communicating by private email, and scheduling acute appointments online when needed. ? ?Please make an Appointment to return in 1 months, or sooner if needed ?

## 2021-12-21 NOTE — Progress Notes (Signed)
Patient ID: CHANC KERVIN, male   DOB: 1951/04/26, 71 y.o.   MRN: [id number] ? ? ? ?    Chief Complaint: follow up post hospn 4/8 - 4/13 with GI bleed, severe anemia, AKI, smoking, allergies ? ?     HPI:  Joshua Curtis is a 71 y.o. male here with wife, overall doing ok, Does have several wks ongoing nasal allergy symptoms with clearish congestion, itch and sneezing, without fever, pain, ST, cough, swelling or wheezing.  BP remains low at home and today but Pt denies chest pain, increased sob or doe, wheezing, orthopnea, PND, increased LE swelling, palpitations, dizziness or syncope.  Also Denies worsening reflux, abd pain, dysphagia, n/v, bowel change or blood.  Currently remains off ASA.  Still smoking , not ready to quit though now down to 2 cigs per day. No other new complaints ?      ?Wt Readings from Last 3 Encounters:  ?12/21/21 161 lb (73 kg)  ?12/12/21 143 lb 4.8 oz (65 kg)  ?11/29/21 154 lb (69.9 kg)  ? ?BP Readings from Last 3 Encounters:  ?12/21/21 122/60  ?12/13/21 96/78  ?11/29/21 (!) 102/50  ? ?      ?Past Medical History:  ?Diagnosis Date  ? Anemia, iron deficiency 04/02/2016  ? Aortic atherosclerosis ([place])   ? Arthritis   ? rt knee  ? BPH (benign prostatic hyperplasia) 04/10/2017  ? Cancer [facility])   ? prostate  ? COLONIC POLYPS, HX OF 11/24/2007  ? COPD (chronic obstructive pulmonary disease) ([place])   ? pt denies this- no respiratory issues of any kind per pt  ? Coronary artery calcification seen on CAT scan   ? DIVERTICULOSIS, COLON 11/24/2007  ? ED (erectile dysfunction)   ? GERD (gastroesophageal reflux disease)   ? HLD (hyperlipidemia) 09/06/2020  ? HYPERTENSION 11/24/2007  ? Increased prostate specific antigen (PSA) velocity 06/22/2011  ? PAH (pulmonary artery hypertension) ([place])   ? Peripheral vascular disease ([place])   ? ?Past Surgical History:  ?Procedure Laterality Date  ? ABDOMINAL AORTOGRAM W/LOWER EXTREMITY N/A 07/04/2020  ? Procedure: ABDOMINAL AORTOGRAM W/LOWER EXTREMITY;  Surgeon: Serafina Mitchell, MD;  Location: [place] [facility];  Service: Cardiovascular;  Laterality: N/A;  ? ABDOMINAL AORTOGRAM W/LOWER EXTREMITY N/A 07/18/2020  ? Procedure: ABDOMINAL AORTOGRAM W/LOWER EXTREMITY;  Surgeon: Serafina Mitchell, MD;  Location: [place] [facility];  Service: Cardiovascular;  Laterality: N/A;  ? ABDOMINAL AORTOGRAM W/LOWER EXTREMITY N/A 04/04/2021  ? Procedure: ABDOMINAL AORTOGRAM W/LOWER EXTREMITY;  Surgeon: Cherre Robins, MD;  Location: [place] [facility];  Service: Cardiovascular;  Laterality: N/A;  ? BYPASS GRAFT FEMORAL-PERONEAL Right 04/13/2021  ? Procedure: RIGHT LEG FEMORAL-PERONEAL BYPASS USING PROPATEN GRAFT;  Surgeon: Serafina Mitchell, MD;  [facility];  Service: Vascular;  Laterality: Right;  ? COLONOSCOPY    ? ESOPHAGOGASTRODUODENOSCOPY (EGD) WITH PROPOFOL N/A 12/09/2021  ? Procedure: ESOPHAGOGASTRODUODENOSCOPY (EGD) WITH PROPOFOL;  Surgeon: Carol Ada, MD;  [facility];  Service: Gastroenterology;  Laterality: N/A;  ? HOT HEMOSTASIS N/A 12/09/2021  ? Procedure: HOT HEMOSTASIS (ARGON PLASMA COAGULATION/BICAP);  Surgeon: Carol Ada, MD;  Location: [facility];  Service: Gastroenterology;  Laterality: N/A;  ? NO PAST SURGERIES    ? PATCH ANGIOPLASTY Right 04/13/2021  ? Procedure: ILIEO-FEMORAL USING VEIN PATCH ANGIOPLASTY;  Surgeon: Serafina Mitchell, MD;  Location: [facility] OR;  Service: Vascular;  Laterality: Right;  ? PERIPHERAL VASCULAR BALLOON ANGIOPLASTY Right 07/18/2020  ? Procedure: PERIPHERAL VASCULAR BALLOON ANGIOPLASTY;  Surgeon: Harold Barban  W, MD;  Location: [place] [facility];  Service: Cardiovascular;  Laterality: Right;  tibioperoneal trunk and posterior tibial  ? PERIPHERAL VASCULAR INTERVENTION  07/04/2020  ? Procedure: PERIPHERAL VASCULAR INTERVENTION;  Surgeon: Serafina Mitchell, MD;  Location: [place] [facility];  Service: Cardiovascular;;  Lt. SFA and Popliteal  ? PERIPHERAL VASCULAR INTERVENTION Right 07/18/2020  ? Procedure: PERIPHERAL VASCULAR INTERVENTION;  Surgeon:  Serafina Mitchell, MD;  Location: [place] [facility];  Service: Cardiovascular;  Laterality: Right;  Femoral Popliteal  ? ROBOT ASSISTED LAPAROSCOPIC RADICAL PROSTATECTOMY N/A 01/08/2021  ? Procedure: XI ROBOTIC ASSISTED LAPAROSCOPIC RADICAL PROSTATECTOMY LEVEL 3;  Surgeon: Raynelle Bring, MD;  [facility];  Service: Urology;  Laterality: N/A;  ? SCLEROTHERAPY  12/09/2021  ? Procedure: SCLEROTHERAPY;  Surgeon: Carol Ada, MD;  Location: [facility];  Service: Gastroenterology;;  ? ? reports that he quit smoking about 8 months ago. His smoking use included cigarettes. He smoked an average of .25 packs per day. He has never used smokeless tobacco. He reports that he does not currently use alcohol. He reports that he does not use drugs. ?family history includes Anuerysm in his brother; Diabetes in his father and paternal grandfather; Hypertension in his sister; Kidney failure in his brother; Stroke in his mother. ?No Known Allergies ?Current Outpatient Medications on File Prior to Visit  ?Medication Sig Dispense Refill  ? ferrous sulfate 325 (65 FE) MG EC tablet Take 1 tablet (325 mg total) by mouth in the morning and at bedtime. 60 tablet 0  ? pantoprazole (PROTONIX) 40 MG tablet Take 1 tablet (40 mg total) by mouth 2 (two) times daily. 180 tablet 0  ? polyethylene glycol powder (MIRALAX) 17 GM/SCOOP powder Take 17 g by mouth 2 (two) times daily as needed for moderate constipation or mild constipation. 255 g 0  ? rosuvastatin (CRESTOR) 40 MG tablet Take 1 tablet (40 mg total) by mouth every morning. 90 tablet 3  ? sucralfate (CARAFATE) 1 g tablet Take 1 tablet (1 g total) by mouth 4 (four) times daily. 120 tablet 0  ? ?No current facility-administered medications on file prior to visit.  ? ?     ROS:  All others reviewed and negative. ? ?Objective  ? ?     PE:  BP 122/60 (BP Location: Left Arm, Patient Position: Sitting, Cuff Size: Large)   Pulse 87   Temp 98.1 ?F (36.7 ?C) (Oral)   Ht '5\' 10"'$  (1.778 m)   Wt  161 lb (73 kg)   SpO2 100%   BMI 23.10 kg/m?  ? ?              Constitutional: Pt appears in NAD ?              HENT: Head: NCAT.  ?              Right Ear: External ear normal.   ?              Left Ear: External ear normal. Bilat tm's with mild erythema.  Max sinus areas non tender.  Pharynx with mild erythema, no exudate ?              Eyes: . Pupils are equal, round, and reactive to light. Conjunctivae and EOM are normal ?              Nose: without d/c or deformity ?              Neck:  Neck supple. Gross normal ROM ?              Cardiovascular: Normal rate and regular rhythm.   ?              Pulmonary/Chest: Effort normal and breath sounds without rales or wheezing.  ?              Abd:  Soft, NT, ND, + BS, no organomegaly ?              Neurological: Pt is alert. At baseline orientation, motor grossly intact ?              Skin: Skin is warm. No rashes, no other new lesions, LE edema - none ?              Psychiatric: Pt behavior is normal without agitation  ? ?Micro: none ? ?Cardiac tracings I have personally interpreted today:  none ? ?Pertinent Radiological findings (summarize): none  ? ?Lab Results  ?Component Value Date  ? WBC 4.8 12/21/2021  ? HGB 8.6 Repeated and verified X2. (L) 12/21/2021  ? HCT 25.1 Repeated and verified X2. (L) 12/21/2021  ? PLT 259.0 12/21/2021  ? GLUCOSE 95 12/21/2021  ? CHOL 78 09/07/2021  ? TRIG 107.0 09/07/2021  ? HDL 28.60 (L) 09/07/2021  ? LDLDIRECT 74.0 04/21/2019  ? [place] 28 09/07/2021  ? ALT 74 (H) 12/21/2021  ? AST 43 (H) 12/21/2021  ? NA 145 12/21/2021  ? K 3.3 (L) 12/21/2021  ? CL 110 12/21/2021  ? CREATININE 1.41 12/21/2021  ? BUN 13 12/21/2021  ? CO2 28 12/21/2021  ? TSH 0.98 09/07/2021  ? PSA 0.00 (L) 09/07/2021  ? INR 1.3 (H) 12/10/2021  ? HGBA1C 5.6 09/07/2021  ? ?Assessment/Plan:  ?DEMIAN MAISEL is a 71 y.o. Black or African American [2] male with  has a past medical history of Anemia, iron deficiency (04/02/2016), Aortic atherosclerosis ([place]), Arthritis,  BPH (benign prostatic hyperplasia) (04/10/2017), Cancer ([place]), COLONIC POLYPS, HX OF (11/24/2007), COPD (chronic obstructive pulmonary disease) ([place]), Coronary artery calcification seen on CAT scan, DIVERTICULOS

## 2021-12-25 ENCOUNTER — Encounter: Payer: Self-pay | Admitting: Internal Medicine

## 2021-12-25 DIAGNOSIS — J309 Allergic rhinitis, unspecified: Secondary | ICD-10-CM | POA: Insufficient documentation

## 2021-12-25 NOTE — Assessment & Plan Note (Signed)
Remains low normal, to remain off BP meds for now, cont to monitor at home and next visit ?

## 2021-12-25 NOTE — Assessment & Plan Note (Addendum)
Lab Results  ?Component Value Date  ? WBC 4.8 12/21/2021  ? HGB 8.6 Repeated and verified X2. (L) 12/21/2021  ? HCT 25.1 Repeated and verified X2. (L) 12/21/2021  ? MCV 92.0 12/21/2021  ? PLT 259.0 12/21/2021  ? ?No worsening, to start oral iron he has not yet started, to stay off asa for now, for f/u cbc ?

## 2021-12-25 NOTE — Assessment & Plan Note (Signed)
Mod for seasonal flare, for depomedrol im 80,,  to f/u any worsening symptoms or concerns ?

## 2021-12-25 NOTE — Assessment & Plan Note (Signed)
t counsled to quit, pt not ready ?

## 2021-12-25 NOTE — Assessment & Plan Note (Signed)
For cardiology f/u ?

## 2021-12-25 NOTE — Assessment & Plan Note (Signed)
Lab Results  ?Component Value Date  ? CREATININE 1.41 12/21/2021  ? ?Stable overall, cont to avoid nephrotoxins ? ?

## 2022-01-24 ENCOUNTER — Ambulatory Visit (INDEPENDENT_AMBULATORY_CARE_PROVIDER_SITE_OTHER): Payer: PPO | Admitting: Internal Medicine

## 2022-01-24 VITALS — BP 138/76 | HR 89 | Temp 98.2°F | Ht 70.0 in | Wt 161.0 lb

## 2022-01-24 DIAGNOSIS — Z0001 Encounter for general adult medical examination with abnormal findings: Secondary | ICD-10-CM | POA: Diagnosis not present

## 2022-01-24 DIAGNOSIS — R972 Elevated prostate specific antigen [PSA]: Secondary | ICD-10-CM

## 2022-01-24 DIAGNOSIS — I10 Essential (primary) hypertension: Secondary | ICD-10-CM

## 2022-01-24 DIAGNOSIS — F17200 Nicotine dependence, unspecified, uncomplicated: Secondary | ICD-10-CM | POA: Diagnosis not present

## 2022-01-24 DIAGNOSIS — E559 Vitamin D deficiency, unspecified: Secondary | ICD-10-CM | POA: Diagnosis not present

## 2022-01-24 DIAGNOSIS — E538 Deficiency of other specified B group vitamins: Secondary | ICD-10-CM | POA: Diagnosis not present

## 2022-01-24 DIAGNOSIS — D509 Iron deficiency anemia, unspecified: Secondary | ICD-10-CM | POA: Diagnosis not present

## 2022-01-24 DIAGNOSIS — R739 Hyperglycemia, unspecified: Secondary | ICD-10-CM | POA: Diagnosis not present

## 2022-01-24 LAB — URINALYSIS, ROUTINE W REFLEX MICROSCOPIC
Bilirubin Urine: NEGATIVE
Hgb urine dipstick: NEGATIVE
Ketones, ur: NEGATIVE
Leukocytes,Ua: NEGATIVE
Nitrite: NEGATIVE
RBC / HPF: NONE SEEN (ref 0–?)
Specific Gravity, Urine: 1.02 (ref 1.000–1.030)
Urine Glucose: NEGATIVE
Urobilinogen, UA: 0.2 (ref 0.0–1.0)
pH: 6 (ref 5.0–8.0)

## 2022-01-24 LAB — CBC WITH DIFFERENTIAL/PLATELET
Basophils Absolute: 0.1 10*3/uL (ref 0.0–0.1)
Basophils Relative: 1.9 % (ref 0.0–3.0)
Eosinophils Absolute: 0.2 10*3/uL (ref 0.0–0.7)
Eosinophils Relative: 3.8 % (ref 0.0–5.0)
HCT: 29.2 % — ABNORMAL LOW (ref 39.0–52.0)
Hemoglobin: 9.6 g/dL — ABNORMAL LOW (ref 13.0–17.0)
Lymphocytes Relative: 22.2 % (ref 12.0–46.0)
Lymphs Abs: 1.4 10*3/uL (ref 0.7–4.0)
MCHC: 32.9 g/dL (ref 30.0–36.0)
MCV: 88.2 fl (ref 78.0–100.0)
Monocytes Absolute: 0.5 10*3/uL (ref 0.1–1.0)
Monocytes Relative: 7.7 % (ref 3.0–12.0)
Neutro Abs: 4 10*3/uL (ref 1.4–7.7)
Neutrophils Relative %: 64.4 % (ref 43.0–77.0)
Platelets: 262 10*3/uL (ref 150.0–400.0)
RBC: 3.32 Mil/uL — ABNORMAL LOW (ref 4.22–5.81)
RDW: 15.8 % — ABNORMAL HIGH (ref 11.5–15.5)
WBC: 6.2 10*3/uL (ref 4.0–10.5)

## 2022-01-24 LAB — IBC PANEL
Iron: 27 ug/dL — ABNORMAL LOW (ref 42–165)
Saturation Ratios: 7.5 % — ABNORMAL LOW (ref 20.0–50.0)
TIBC: 358.4 ug/dL (ref 250.0–450.0)
Transferrin: 256 mg/dL (ref 212.0–360.0)

## 2022-01-24 LAB — HEPATIC FUNCTION PANEL
ALT: 25 U/L (ref 0–53)
AST: 25 U/L (ref 0–37)
Albumin: 3.8 g/dL (ref 3.5–5.2)
Alkaline Phosphatase: 79 U/L (ref 39–117)
Bilirubin, Direct: 0.2 mg/dL (ref 0.0–0.3)
Total Bilirubin: 0.6 mg/dL (ref 0.2–1.2)
Total Protein: 6.2 g/dL (ref 6.0–8.3)

## 2022-01-24 LAB — VITAMIN D 25 HYDROXY (VIT D DEFICIENCY, FRACTURES): VITD: 13.21 ng/mL — ABNORMAL LOW (ref 30.00–100.00)

## 2022-01-24 LAB — TSH: TSH: 0.8 u[IU]/mL (ref 0.35–5.50)

## 2022-01-24 LAB — LIPID PANEL
Cholesterol: 75 mg/dL (ref 0–200)
HDL: 34 mg/dL — ABNORMAL LOW (ref >39.00)
LDL Cholesterol: 29 mg/dL (ref 0–99)
NonHDL: 40.84
Total CHOL/HDL Ratio: 2
Triglycerides: 61 mg/dL (ref 0.0–149.0)
VLDL: 12.2 mg/dL (ref 0.0–40.0)

## 2022-01-24 LAB — BASIC METABOLIC PANEL
BUN: 12 mg/dL (ref 6–23)
CO2: 32 mEq/L (ref 19–32)
Calcium: 8.6 mg/dL (ref 8.4–10.5)
Chloride: 107 mEq/L (ref 96–112)
Creatinine, Ser: 1.38 mg/dL (ref 0.40–1.50)
GFR: 52.42 mL/min — ABNORMAL LOW (ref >60.00)
Glucose, Bld: 73 mg/dL (ref 70–99)
Potassium: 4.1 mEq/L (ref 3.5–5.1)
Sodium: 145 mEq/L (ref 135–145)

## 2022-01-24 LAB — FERRITIN: Ferritin: 11.5 ng/mL — ABNORMAL LOW (ref 22.0–322.0)

## 2022-01-24 LAB — PSA: PSA: 0 ng/mL — ABNORMAL LOW (ref 0.10–4.00)

## 2022-01-24 LAB — HEMOGLOBIN A1C: Hgb A1c MFr Bld: 5.6 % (ref 4.6–6.5)

## 2022-01-24 LAB — VITAMIN B12: Vitamin B-12: 287 pg/mL (ref 211–911)

## 2022-01-24 MED ORDER — PANTOPRAZOLE SODIUM 40 MG PO TBEC: 40.0000 mg | DELAYED_RELEASE_TABLET | Freq: Two times a day (BID) | ORAL | 3 refills | Status: DC

## 2022-01-24 NOTE — Progress Notes (Unsigned)
Patient ID: Joshua Curtis, male   DOB: 15-Dec-1950, 71 y.o.   MRN: [id number]         Chief Complaint:: wellness exam and 45mofollow up  ***       HPI:  Joshua BUDNEYis a 71y.o. male here for wellness exam                        Also***   Wt Readings from Last 3 Encounters:  01/24/22 161 lb (73 kg)  12/21/21 161 lb (73 kg)  12/12/21 143 lb 4.8 oz (65 kg)   BP Readings from Last 3 Encounters:  01/24/22 138/76  12/21/21 122/60  12/13/21 96/78   Immunization History  Administered Date(s) Administered   Fluad Quad(high Dose 65+) 04/21/2019, 09/06/2020   Influenza Split 06/21/2011   Influenza, High Dose Seasonal PF 10/03/2015, 10/16/2018, 06/22/2021   Influenza, Seasonal, Injecte, Preservative Fre 09/25/2012   Influenza,inj,Quad PF,6+ Mos 09/28/2013, 09/29/2014   PFIZER(Purple Top)SARS-COV-2 Vaccination 11/13/2019, 12/08/2019, 07/28/2020   Pfizer Covid-19 Vaccine Bivalent Booster 142yr& up 06/22/2021   Pneumococcal Conjugate-13 10/16/2018   Pneumococcal Polysaccharide-23 03/07/2021   Td 03/14/2010   Tdap 09/06/2020  There are no preventive care reminders to display for this patient.    Past Medical History:  Diagnosis Date   Anemia, iron deficiency 04/02/2016   Aortic atherosclerosis ([facility])    Arthritis    rt knee   BPH (benign prostatic hyperplasia) 04/10/2017   [place]   COLONIC POLYPS, HX OF 11/24/2007   COPD (chronic obstructive pulmonary disease) ([facility])    pt denies this- no respiratory issues of any kind per pt   Coronary artery calcification seen on CAT scan    DIVERTICULOSIS, COLON 11/24/2007   ED (erectile dysfunction)    GERD (gastroesophageal reflux disease)    HLD (hyperlipidemia) 09/06/2020   HYPERTENSION 11/24/2007   Increased prostate specific antigen (PSA) velocity 06/22/2011   PA[place] (p[place] artery hypertension) [place]   [place]   Past Surgical History:  Procedure Laterality Date   ABDOMINAL AORTOGRAM  W/LOWER EXTREMITY N/A 07/04/2020   Procedure: ABDOMINAL AORTOGRAM W/LOWER EXTREMITY;  Surgeon: BrSerafina MitchellMD;  [facility];  Service: Cardiovascular;  Laterality: N/A;   ABDOMINAL AORTOGRAM W/LOWER EXTREMITY N/A 07/18/2020   Procedure: ABDOMINAL AORTOGRAM W/LOWER EXTREMITY;  Surgeon: BrSerafina MitchellMD;  [facility];  Service: Cardiovascular;  Laterality: N/A;   ABDOMINAL AORTOGRAM W/LOWER EXTREMITY N/A 04/04/2021   Procedure: ABDOMINAL AORTOGRAM W/LOWER EXTREMITY;  Surgeon: HaCherre RobinsMD;  [facility];  Service: Cardiovascular;  Laterality: N/A;   BYPASS GRAFT FEMORAL-PERONEAL Right 04/13/2021   Procedure: RIGHT LEG FEMORAL-PERONEAL BYPASS USING PROPATEN GRAFT;  Surgeon: BrSerafina MitchellMD;  [facility];  Service: Vascular;  Laterality: Right;   COLONOSCOPY     ESOPHAGOGASTRODUODENOSCOPY (EGD) WITH PROPOFOL N/A 12/09/2021   Procedure: ESOPHAGOGASTRODUODENOSCOPY (EGD) WITH PROPOFOL;  Surgeon: HuCarol AdaMD;  [facility];  Service: Gastroenterology;  Laterality: N/A;   HOT HEMOSTASIS N/A 12/09/2021   Procedure: HOT HEMOSTASIS (ARGON PLASMA COAGULATION/BICAP);  Surgeon: HuCarol AdaMD;  [facility];  Service: Gastroenterology;  Laterality: N/A;   NO PAST SURGERIES     PATCH ANGIOPLASTY Right 04/13/2021   Procedure: ILIEO-FEMORAL USING VEIN PATCH ANGIOPLASTY;  Surgeon: BrSerafina MitchellMD;  [facility];  Service: Vascular;  Laterality: Right;   PERIPHERAL VASCULAR BALLOON  ANGIOPLASTY Right 07/18/2020   Procedure: PERIPHERAL VASCULAR BALLOON ANGIOPLASTY;  Surgeon: Serafina Mitchell, MD;  Location: [place] [facility];  Service: Cardiovascular;  Laterality: Right;  tibioperoneal trunk and posterior tibial   PERIPHERAL VASCULAR INTERVENTION  07/04/2020   Procedure: PERIPHERAL VASCULAR INTERVENTION;  Surgeon: Serafina Mitchell, MD;  Location: [place] [facility];  Service: Cardiovascular;;  Lt. SFA and Popliteal    PERIPHERAL VASCULAR INTERVENTION Right 07/18/2020   Procedure: PERIPHERAL VASCULAR INTERVENTION;  Surgeon: Serafina Mitchell, MD;  Location: [place] [facility];  Service: Cardiovascular;  Laterality: Right;  Femoral Popliteal   ROBOT ASSISTED LAPAROSCOPIC RADICAL PROSTATECTOMY N/A 01/08/2021   Procedure: XI ROBOTIC ASSISTED LAPAROSCOPIC RADICAL PROSTATECTOMY LEVEL 3;  Surgeon: Raynelle Bring, MD;  [facility];  Service: Urology;  Laterality: N/A;   SCLEROTHERAPY  12/09/2021   Procedure: SCLEROTHERAPY;  Surgeon: Carol Ada, MD;  [facility];  Service: Gastroenterology;;    reports that he quit smoking about 9 months ago. His smoking use included cigarettes. He smoked an average of .25 packs per day. He has never used smokeless tobacco. He reports that he does not currently use alcohol. He reports that he does not use drugs. family history includes Anuerysm in his brother; Diabetes in his father and paternal grandfather; Hypertension in his sister; Kidney failure in his brother; Stroke in his mother. No Known Allergies Current Outpatient Medications on File Prior to Visit  Medication Sig Dispense Refill   iron polysaccharides (NU-IRON) 150 MG capsule Take 1 capsule (150 mg total) by mouth 2 (two) times daily. 180 capsule 1   pantoprazole (PROTONIX) 40 MG tablet Take 1 tablet (40 mg total) by mouth 2 (two) times daily. 180 tablet 0   rosuvastatin (CRESTOR) 40 MG tablet Take 1 tablet (40 mg total) by mouth every morning. 90 tablet 3   ferrous sulfate 325 (65 FE) MG EC tablet Take 1 tablet (325 mg total) by mouth in the morning and at bedtime. (Patient not taking: Reported on 01/24/2022) 60 tablet 0   polyethylene glycol powder (MIRALAX) 17 GM/SCOOP powder Take 17 g by mouth 2 (two) times daily as needed for moderate constipation or mild constipation. 255 g 0   sucralfate (CARAFATE) 1 g tablet Take 1 tablet (1 g total) by mouth 4 (four) times daily. (Patient not taking: Reported on  01/24/2022) 120 tablet 0   No current facility-administered medications on file prior to visit.        ROS:  All others reviewed and negative.  Objective        PE:  BP 138/76 (BP Location: Right Arm, Patient Position: Sitting, Cuff Size: Large)   Pulse 89   Temp 98.2 F (36.8 C) (Oral)   Ht '5\' 10"'$  (1.778 m)   Wt 161 lb (73 kg)   SpO2 96%   BMI 23.10 kg/m                 Constitutional: Pt appears in NAD               HENT: Head: NCAT.                Right Ear: External ear normal.                 Left Ear: External ear normal.                Eyes: . Pupils are equal, round, and reactive to light. Conjunctivae and EOM are normal  Nose: without d/c or deformity               Neck: Neck supple. Gross normal ROM               Cardiovascular: Normal rate and regular rhythm.                 Pulmonary/Chest: Effort normal and breath sounds without rales or wheezing.                Abd:  Soft, NT, ND, + BS, no organomegaly               Neurological: Pt is alert. At baseline orientation, motor grossly intact               Skin: Skin is warm. No rashes, no other new lesions, LE edema - ***               Psychiatric: Pt behavior is normal without agitation   Micro: none  Cardiac tracings I have personally interpreted today:  none  Pertinent Radiological findings (summarize): none   Lab Results  Component Value Date   WBC 4.8 12/21/2021   HGB 8.6 Repeated and verified X2. (L) 12/21/2021   HCT 25.1 Repeated and verified X2. (L) 12/21/2021   PLT 259.0 12/21/2021   GLUCOSE 95 12/21/2021   CHOL 78 09/07/2021   TRIG 107.0 09/07/2021   HDL 28.60 (L) 09/07/2021   LDLDIRECT 74.0 04/21/2019   LDLCALC 28 09/07/2021   ALT 74 (H) 12/21/2021   AST 43 (H) 12/21/2021   NA 145 12/21/2021   K 3.3 (L) 12/21/2021   CL 110 12/21/2021   CREATININE 1.41 12/21/2021   BUN 13 12/21/2021   CO2 28 12/21/2021   TSH 0.98 09/07/2021   PSA 0.00 (L) 09/07/2021   INR 1.3 (H) 12/10/2021    HGBA1C 5.6 09/07/2021   Assessment/Plan:  Joshua Curtis is a 71 y.o. Black or African American [2] male with  has a past medical history of Anemia, iron deficiency (04/02/2016), Aortic atherosclerosis ([place]), Arthritis, BPH (benign prostatic hyperplasia) (04/10/2017), Cancer ([place]), COLONIC POLYPS, HX OF (11/24/2007), COPD (chronic obstructive pulmonary disease) ([place]), Coronary artery calcification seen on CAT scan, DIVERTICULOSIS, COLON (11/24/2007), ED (erectile dysfunction), GERD (gastroesophageal reflux disease), HLD (hyperlipidemia) (09/06/2020), HYPERTENSION (11/24/2007), Increased prostate specific antigen (PSA) velocity (06/22/2011), PAH (pulmonary artery hypertension) ([place]), and Peripheral vascular disease ([place]).  No problem-specific Assessment & Plan notes found for this encounter.  Followup: No follow-ups on file.  Cathlean Cower, MD 01/24/2022 11:53 AM [facility]

## 2022-01-24 NOTE — Patient Instructions (Signed)

## 2022-01-25 ENCOUNTER — Encounter: Payer: Self-pay | Admitting: Internal Medicine

## 2022-01-25 DIAGNOSIS — C61 Malignant neoplasm of prostate: Secondary | ICD-10-CM | POA: Diagnosis not present

## 2022-01-25 DIAGNOSIS — D509 Iron deficiency anemia, unspecified: Secondary | ICD-10-CM | POA: Insufficient documentation

## 2022-01-25 NOTE — Progress Notes (Signed)
Ok this is ordered 

## 2022-01-27 ENCOUNTER — Encounter: Payer: Self-pay | Admitting: Internal Medicine

## 2022-01-27 NOTE — Assessment & Plan Note (Signed)
Lab Results  Component Value Date   PSA 0.00 (L) 01/24/2022   PSA 0.00 (L) 09/07/2021   PSA <0.015 05/28/2021   stable

## 2022-01-27 NOTE — Assessment & Plan Note (Signed)
Now former smoer x 9 mo, encouraged to continue to abstain

## 2022-01-27 NOTE — Assessment & Plan Note (Signed)
Likely improved, for cbc with labs today

## 2022-01-27 NOTE — Assessment & Plan Note (Signed)

## 2022-01-27 NOTE — Assessment & Plan Note (Signed)
BP Readings from Last 3 Encounters:  01/24/22 138/76  12/21/21 122/60  12/13/21 96/78   Stable, pt to continue medical treatment  - diet

## 2022-01-27 NOTE — Assessment & Plan Note (Signed)
Lab Results  Component Value Date   HGBA1C 5.6 01/24/2022   Stable, pt to continue current medical treatment  - diet

## 2022-01-27 NOTE — Assessment & Plan Note (Signed)
Last vitamin D Lab Results  Component Value Date   VD25OH 13.21 (L) 01/24/2022   Low, to start oral replacement  

## 2022-01-29 ENCOUNTER — Telehealth: Payer: Self-pay | Admitting: Pharmacy Technician

## 2022-01-29 NOTE — Telephone Encounter (Signed)
Dr. Jeneen Rinks, Juluis Rainier note:  Auth Submission: no auth needed Payer: [employer] advt Medication & CPT/J Code(s) submitted: Feraheme (ferumoxytol) Q7619 Route of submission (phone, fax, portal): phone: [phone] - opt 1 Auth type: Buy/Bill Units/visits requested: x2 doses Reference number: [id number] Approval from: 01/30/23 to 01/30/23   Patient will be scheduled as soon as possible

## 2022-02-01 DIAGNOSIS — N5201 Erectile dysfunction due to arterial insufficiency: Secondary | ICD-10-CM | POA: Diagnosis not present

## 2022-02-01 DIAGNOSIS — C61 Malignant neoplasm of prostate: Secondary | ICD-10-CM | POA: Diagnosis not present

## 2022-02-05 ENCOUNTER — Ambulatory Visit (INDEPENDENT_AMBULATORY_CARE_PROVIDER_SITE_OTHER): Payer: PPO

## 2022-02-05 VITALS — BP 173/85 | HR 87 | Temp 98.4°F | Resp 18 | Ht 70.0 in | Wt 157.6 lb

## 2022-02-05 DIAGNOSIS — D509 Iron deficiency anemia, unspecified: Secondary | ICD-10-CM

## 2022-02-05 MED ORDER — SODIUM CHLORIDE 0.9 % IV SOLN
510.0000 mg | Freq: Once | INTRAVENOUS | Status: AC
  Administered 2022-02-05: 510 mg via INTRAVENOUS
  Filled 2022-02-05: qty 17

## 2022-02-05 NOTE — Progress Notes (Signed)
Diagnosis: Iron Deficiency Anemia  Provider:  Marshell Garfinkel, MD  Procedure: Infusion  IV Type: Peripheral, IV Location: R Antecubital  Feraheme (Ferumoxytol), Dose: 510 mg  Infusion Start Time: 7096  Infusion Stop Time: 4383  Post Infusion IV Care: Observation period completed and Peripheral IV Discontinued  Discharge: Condition: Good, Destination: Home . AVS provided to patient.   Performed by:  Adelina Mings, LPN

## 2022-02-12 ENCOUNTER — Ambulatory Visit (INDEPENDENT_AMBULATORY_CARE_PROVIDER_SITE_OTHER): Payer: PPO

## 2022-02-12 VITALS — BP 162/80 | HR 75 | Temp 98.4°F | Resp 18 | Ht 70.0 in | Wt 154.8 lb

## 2022-02-12 DIAGNOSIS — D509 Iron deficiency anemia, unspecified: Secondary | ICD-10-CM

## 2022-02-12 MED ORDER — SODIUM CHLORIDE 0.9 % IV SOLN
510.0000 mg | Freq: Once | INTRAVENOUS | Status: AC
  Administered 2022-02-12: 510 mg via INTRAVENOUS
  Filled 2022-02-12: qty 17

## 2022-02-12 NOTE — Progress Notes (Signed)
Diagnosis: Iron Deficiency Anemia  Provider:  Marshell Garfinkel, MD  Procedure: Infusion  IV Type: Peripheral, IV Location: L Antecubital  Feraheme (Ferumoxytol), Dose: 510 mg  Infusion Start Time: 3736  Infusion Stop Time: 6815  Post Infusion IV Care: Peripheral IV Discontinued  Discharge: Condition: Good, Destination: Home . AVS provided to patient.   Performed by:  Adelina Mings, LPN

## 2022-02-15 ENCOUNTER — Ambulatory Visit: Payer: PPO | Admitting: Podiatry

## 2022-02-15 DIAGNOSIS — B351 Tinea unguium: Secondary | ICD-10-CM

## 2022-02-15 DIAGNOSIS — M79674 Pain in right toe(s): Secondary | ICD-10-CM

## 2022-02-15 DIAGNOSIS — M79675 Pain in left toe(s): Secondary | ICD-10-CM

## 2022-02-15 DIAGNOSIS — I739 Peripheral vascular disease, unspecified: Secondary | ICD-10-CM

## 2022-02-18 NOTE — Progress Notes (Signed)
Subjective: 71 y.o. returns the office today for painful, elongated, thickened toenails which he cannot trim himself.  States his big toenail previous ingrown.  Denies any swelling or redness or any drainage.  He has no other concerns today to his feet.  PCP: Biagio Borg, MD Last Seen: The 25th 2023  A1c: 5.6 on Jan 24, 2022  Objective: AAO 3, NAD DP/PT pulses 1/4, CRT less than 3 seconds Nails hypertrophic, dystrophic, elongated, brittle, discolored 10. There is tenderness overlying the nails 1-5 bilaterally. There is no surrounding erythema or drainage along the nail sites.  Incurvation of the hallux toenail without any edema, erythema, drainage or pus or signs of infection. No open lesions or pre-ulcerative lesions are identified. No other areas of tenderness bilateral lower extremities. No overlying edema, erythema, increased warmth. No pain with calf compression, swelling, warmth, erythema.  Assessment: Patient presents with symptomatic onychomycosis, ingrown toenail, PAD  Plan: -Treatment options including alternatives, risks, complications were discussed -Nails sharply debrided 10 without complication/bleeding. -Discussed daily foot inspection. If there are any changes, to call the office immediately.  -Follow-up in 3 months or sooner if any problems are to arise. In the meantime, encouraged to call the office with any questions, concerns, changes symptoms.  Celesta Gentile, DPM

## 2022-03-08 ENCOUNTER — Encounter: Payer: Self-pay | Admitting: Internal Medicine

## 2022-03-08 ENCOUNTER — Ambulatory Visit (INDEPENDENT_AMBULATORY_CARE_PROVIDER_SITE_OTHER): Payer: PPO | Admitting: Internal Medicine

## 2022-03-08 VITALS — BP 154/70 | HR 87 | Temp 98.3°F | Ht 70.0 in | Wt 155.6 lb

## 2022-03-08 DIAGNOSIS — I10 Essential (primary) hypertension: Secondary | ICD-10-CM | POA: Diagnosis not present

## 2022-03-08 DIAGNOSIS — D509 Iron deficiency anemia, unspecified: Secondary | ICD-10-CM | POA: Diagnosis not present

## 2022-03-08 DIAGNOSIS — E559 Vitamin D deficiency, unspecified: Secondary | ICD-10-CM | POA: Diagnosis not present

## 2022-03-08 DIAGNOSIS — R739 Hyperglycemia, unspecified: Secondary | ICD-10-CM | POA: Diagnosis not present

## 2022-03-08 LAB — BASIC METABOLIC PANEL
BUN: 12 mg/dL (ref 6–23)
CO2: 29 mEq/L (ref 19–32)
Calcium: 8.8 mg/dL (ref 8.4–10.5)
Chloride: 107 mEq/L (ref 96–112)
Creatinine, Ser: 1.26 mg/dL (ref 0.40–1.50)
GFR: 58.42 mL/min — ABNORMAL LOW (ref >60.00)
Glucose, Bld: 99 mg/dL (ref 70–99)
Potassium: 3.7 mEq/L (ref 3.5–5.1)
Sodium: 143 mEq/L (ref 135–145)

## 2022-03-08 LAB — HEPATIC FUNCTION PANEL
ALT: 25 U/L (ref 0–53)
AST: 27 U/L (ref 0–37)
Albumin: 4.3 g/dL (ref 3.5–5.2)
Alkaline Phosphatase: 62 U/L (ref 39–117)
Bilirubin, Direct: 0.2 mg/dL (ref 0.0–0.3)
Total Bilirubin: 1 mg/dL (ref 0.2–1.2)
Total Protein: 6.7 g/dL (ref 6.0–8.3)

## 2022-03-08 LAB — CBC WITH DIFFERENTIAL/PLATELET
Basophils Absolute: 0.1 10*3/uL (ref 0.0–0.1)
Basophils Relative: 1.7 % (ref 0.0–3.0)
Eosinophils Absolute: 0.3 10*3/uL (ref 0.0–0.7)
Eosinophils Relative: 6.9 % — ABNORMAL HIGH (ref 0.0–5.0)
HCT: 39.8 % (ref 39.0–52.0)
Hemoglobin: 12.9 g/dL — ABNORMAL LOW (ref 13.0–17.0)
Lymphocytes Relative: 28.3 % (ref 12.0–46.0)
Lymphs Abs: 1.2 10*3/uL (ref 0.7–4.0)
MCHC: 32.4 g/dL (ref 30.0–36.0)
MCV: 88.6 fl (ref 78.0–100.0)
Monocytes Absolute: 0.4 10*3/uL (ref 0.1–1.0)
Monocytes Relative: 10.4 % (ref 3.0–12.0)
Neutro Abs: 2.2 10*3/uL (ref 1.4–7.7)
Neutrophils Relative %: 52.7 % (ref 43.0–77.0)
Platelets: 169 10*3/uL (ref 150.0–400.0)
RBC: 4.49 Mil/uL (ref 4.22–5.81)
RDW: 18.9 % — ABNORMAL HIGH (ref 11.5–15.5)
WBC: 4.1 10*3/uL (ref 4.0–10.5)

## 2022-03-08 LAB — IBC PANEL
Iron: 56 ug/dL (ref 42–165)
Saturation Ratios: 18.2 % — ABNORMAL LOW (ref 20.0–50.0)
TIBC: 308 ug/dL (ref 250.0–450.0)
Transferrin: 220 mg/dL (ref 212.0–360.0)

## 2022-03-08 LAB — FERRITIN: Ferritin: 135 ng/mL (ref 22.0–322.0)

## 2022-03-08 MED ORDER — AMLODIPINE BESY-BENAZEPRIL HCL 10-20 MG PO CAPS: 1.0000 | ORAL_CAPSULE | Freq: Every day | ORAL | 3 refills | Status: DC

## 2022-03-08 NOTE — Assessment & Plan Note (Signed)
No recent overt bleeding, for f/u lab today 

## 2022-03-08 NOTE — Progress Notes (Signed)
Patient ID: Joshua Curtis, male   DOB: 01-Dec-1950, 71 y.o.   MRN: [id number]        Chief Complaint: follow up iron def anemia, htn, low vit d       HPI:  Joshua Curtis is a 71 y.o. male here with c/o persistent mild sob doe recently with walking up stairs, but o/w no symtpoms with walking normally at level;  BP has been increased at home similar to today and asks to restart med tx; did well with lotrel previously; no overt bleeding recently.  Pt denies chest pain, wheezing, orthopnea, PND, increased LE swelling, palpitations, dizziness or syncope.   Pt denies polydipsia, polyuria, or new focal neuro s/s.    Pt denies fever, wt loss, night sweats, loss of appetite, or other constitutional symptoms  not taking Vit D    No longer smoking, no ETOH use x 2 mo.  Wt Readings from Last 3 Encounters:  03/08/22 155 lb 9.6 oz (70.6 kg)  02/12/22 154 lb 12.8 oz (70.2 kg)  02/05/22 157 lb 9.6 oz (71.5 kg)   BP Readings from Last 3 Encounters:  03/08/22 (!) 154/70  02/12/22 (!) 162/80  02/05/22 (!) 173/85         Past Medical History:  Diagnosis Date   Anemia, iron deficiency 04/02/2016   Aortic atherosclerosis (HCC)    Arthritis    rt knee   BPH (benign prostatic hyperplasia) 04/10/2017   Cancer ([place])    prostate   COLONIC POLYPS, HX OF 11/24/2007   COPD (chronic obstructive pulmonary disease) ([place])    pt denies this- no respiratory issues of any kind per pt   Coronary artery calcification seen on CAT scan    DIVERTICULOSIS, COLON 11/24/2007   ED (erectile dysfunction)    GERD (gastroesophageal reflux disease)    HLD (hyperlipidemia) 09/06/2020   HYPERTENSION 11/24/2007   Increased prostate specific antigen (PSA) velocity 06/22/2011   PAH (pulmonary artery hypertension) ([place])    Peripheral vascular disease ([place])    Past Surgical History:  Procedure Laterality Date   ABDOMINAL AORTOGRAM W/LOWER EXTREMITY N/A 07/04/2020   Procedure: ABDOMINAL AORTOGRAM W/LOWER EXTREMITY;  Surgeon: Serafina Mitchell, MD;  Location: [facility];  Service: Cardiovascular;  Laterality: N/A;   ABDOMINAL AORTOGRAM W/LOWER EXTREMITY N/A 07/18/2020   Procedure: ABDOMINAL AORTOGRAM W/LOWER EXTREMITY;  Surgeon: Serafina Mitchell, MD;  Location: [facility];  Service: Cardiovascular;  Laterality: N/A;   ABDOMINAL AORTOGRAM W/LOWER EXTREMITY N/A 04/04/2021   Procedure: ABDOMINAL AORTOGRAM W/LOWER EXTREMITY;  Surgeon: Cherre Robins, MD;  Location: [facility];  Service: Cardiovascular;  Laterality: N/A;   BYPASS GRAFT FEMORAL-PERONEAL Right 04/13/2021   Procedure: RIGHT LEG FEMORAL-PERONEAL BYPASS USING PROPATEN GRAFT;  Surgeon: Serafina Mitchell, MD;  Location: [facility];  Service: Vascular;  Laterality: Right;   COLONOSCOPY     ESOPHAGOGASTRODUODENOSCOPY (EGD) WITH PROPOFOL N/A 12/09/2021   Procedure: ESOPHAGOGASTRODUODENOSCOPY (EGD) WITH PROPOFOL;  Surgeon: Carol Ada, MD;  Location: [facility];  Service: Gastroenterology;  Laterality: N/A;   HOT HEMOSTASIS N/A 12/09/2021   Procedure: HOT HEMOSTASIS (ARGON PLASMA COAGULATION/BICAP);  Surgeon: Carol Ada, MD;  Location: [facility];  Service: Gastroenterology;  Laterality: N/A;   NO PAST SURGERIES     PATCH ANGIOPLASTY Right 04/13/2021   Procedure: ILIEO-FEMORAL USING VEIN PATCH ANGIOPLASTY;  Surgeon: Serafina Mitchell, MD;  Location: [facility];  Service: Vascular;  Laterality: Right;   PERIPHERAL VASCULAR BALLOON ANGIOPLASTY Right 07/18/2020   Procedure:  PERIPHERAL VASCULAR BALLOON ANGIOPLASTY;  Surgeon: Serafina Mitchell, MD;  Location: [facility];  Service: Cardiovascular;  Laterality: Right;  tibioperoneal trunk and posterior tibial   PERIPHERAL VASCULAR INTERVENTION  07/04/2020   Procedure: PERIPHERAL VASCULAR INTERVENTION;  Surgeon: Serafina Mitchell, MD;  Location: [facility];  Service: Cardiovascular;;  Lt. SFA and Popliteal   PERIPHERAL VASCULAR INTERVENTION Right 07/18/2020   Procedure: PERIPHERAL VASCULAR INTERVENTION;   Surgeon: Serafina Mitchell, MD;  Location: [facility];  Service: Cardiovascular;  Laterality: Right;  Femoral Popliteal   ROBOT ASSISTED LAPAROSCOPIC RADICAL PROSTATECTOMY N/A 01/08/2021   Procedure: XI ROBOTIC ASSISTED LAPAROSCOPIC RADICAL PROSTATECTOMY LEVEL 3;  Surgeon: Raynelle Bring, MD;  Location[facility];  Service: Urology;  Laterality: N/A;   SCLEROTHERAPY  12/09/2021   Procedure: SCLEROTHERAPY;  Surgeon: Carol Ada, MD;  [facility];  Service: Gastroenterology;;    reports that he quit smoking about 10 months ago. His smoking use included cigarettes. He smoked an average of .25 packs per day. He has never used smokeless tobacco. He reports that he does not currently use alcohol. He reports that he does not use drugs. family history includes Anuerysm in his brother; Diabetes in his father and paternal grandfather; Hypertension in his sister; Kidney failure in his brother; Stroke in his mother. No Known Allergies Current Outpatient Medications on File Prior to Visit  Medication Sig Dispense Refill   iron polysaccharides (NU-IRON) 150 MG capsule Take 1 capsule (150 mg total) by mouth 2 (two) times daily. 180 capsule 1   pantoprazole (PROTONIX) 40 MG tablet Take 1 tablet (40 mg total) by mouth 2 (two) times daily. 180 tablet 3   polyethylene glycol powder (MIRALAX) 17 GM/SCOOP powder Take 17 g by mouth 2 (two) times daily as needed for moderate constipation or mild constipation. 255 g 0   rosuvastatin (CRESTOR) 40 MG tablet Take 1 tablet (40 mg total) by mouth every morning. 90 tablet 3   No current facility-administered medications on file prior to visit.        ROS:  All others reviewed and negative.  Objective        PE:  BP (!) 154/70 (BP Location: Right Arm, Patient Position: Sitting, Cuff Size: Normal)   Pulse 87   Temp 98.3 F (36.8 C) (Oral)   Ht '5\' 10"'$  (1.778 m)   Wt 155 lb 9.6 oz (70.6 kg)   SpO2 98%   BMI 22.33 kg/m                 Constitutional: Pt  appears in NAD               HENT: Head: NCAT.                Right Ear: External ear normal.                 Left Ear: External ear normal.                Eyes: . Pupils are equal, round, and reactive to light. Conjunctivae and EOM are normal               Nose: without d/c or deformity               Neck: Neck supple. Gross normal ROM               Cardiovascular: Normal rate and regular rhythm.  Pulmonary/Chest: Effort normal and breath sounds without rales or wheezing.                Abd:  Soft, NT, ND, + BS, no organomegaly               Neurological: Pt is alert. At baseline orientation, motor grossly intact               Skin: Skin is warm. No rashes, no other new lesions, LE edema - none               Psychiatric: Pt behavior is normal without agitation   Micro: none  Cardiac tracings I have personally interpreted today:  none  Pertinent Radiological findings (summarize): none   Lab Results  Component Value Date   WBC 6.2 01/24/2022   HGB 9.6 (L) 01/24/2022   HCT 29.2 (L) 01/24/2022   PLT 262.0 01/24/2022   GLUCOSE 73 01/24/2022   CHOL 75 01/24/2022   TRIG 61.0 01/24/2022   HDL 34.00 (L) 01/24/2022   LDLDIRECT 74.0 04/21/2019   LDLCALC 29 01/24/2022   ALT 25 01/24/2022   AST 25 01/24/2022   NA 145 01/24/2022   K 4.1 01/24/2022   CL 107 01/24/2022   CREATININE 1.38 01/24/2022   BUN 12 01/24/2022   CO2 32 01/24/2022   TSH 0.80 01/24/2022   PSA 0.00 (L) 01/24/2022   INR 1.3 (H) 12/10/2021   HGBA1C 5.6 01/24/2022   Assessment/Plan:  Joshua Curtis is a 72 y.o. Black or African American [2] male with  has a past medical history of Anemia, iron deficiency (04/02/2016), Aortic atherosclerosis ([place]), Arthritis, BPH (benign prostatic hyperplasia) (04/10/2017), Cancer ([place]), COLONIC POLYPS, HX OF (11/24/2007), COPD (chronic obstructive pulmonary disease) ([place]), Coronary artery calcification seen on CAT scan, DIVERTICULOSIS, COLON (11/24/2007), ED (erectile  dysfunction), GERD (gastroesophageal reflux disease), HLD (hyperlipidemia) (09/06/2020), HYPERTENSION (11/24/2007), Increased prostate specific antigen (PSA) velocity (06/22/2011), PAH (pulmonary artery hypertension) ([place]), and Peripheral vascular disease ([place]).  Vitamin D deficiency Last vitamin D Lab Results  Component Value Date   VD25OH 13.21 (L) 01/24/2022   Low to start oral replacement   Iron deficiency anemia No recent overt bleeding, for f/u lab today  Essential hypertension BP Readings from Last 3 Encounters:  03/08/22 (!) 154/70  02/12/22 (!) 162/80  02/05/22 (!) 173/85   uncontrolled, pt to restart lotrel 10-20 mg - 1 qd,f/u bp at home and next visit   Hyperglycemia Lab Results  Component Value Date   HGBA1C 5.6 01/24/2022   Stable, pt to continue current medical treatment  - diet, wt control, excercise  Followup: Return in about 6 months (around 09/08/2022).  Cathlean Cower, MD 03/08/2022 1:00 PM [facility]

## 2022-03-08 NOTE — Assessment & Plan Note (Signed)
BP Readings from Last 3 Encounters:  03/08/22 (!) 154/70  02/12/22 (!) 162/80  02/05/22 (!) 173/85   uncontrolled, pt to restart lotrel 10-20 mg - 1 qd,f/u bp at home and next visit

## 2022-03-08 NOTE — Assessment & Plan Note (Signed)
Last vitamin D Lab Results  Component Value Date   VD25OH 13.21 (L) 01/24/2022   Low to start oral replacement

## 2022-03-08 NOTE — Assessment & Plan Note (Signed)
Lab Results  Component Value Date   HGBA1C 5.6 01/24/2022   Stable, pt to continue current medical treatment  - diet, wt control, excercise

## 2022-03-08 NOTE — Patient Instructions (Signed)
Please take all new medication as prescribed - the restarting lotrel 10-20 mg - 1 per day  Please continue all other medications as before, and refills have been done if requested.  Please have the pharmacy call with any other refills you may need.  Please continue your efforts at being more active, low cholesterol diet, and weight control.  Please keep your appointments with your specialists as you may have planned  Please go to the LAB at the blood drawing area for the tests to be done  You will be contacted by phone if any changes need to be made immediately.  Otherwise, you will receive a letter about your results with an explanation, but please check with MyChart first.  Please remember to sign up for MyChart if you have not done so, as this will be important to you in the future with finding out test results, communicating by private email, and scheduling acute appointments online when needed.  Please make an Appointment to return in 6 months, or sooner if needed

## 2022-05-31 ENCOUNTER — Ambulatory Visit: Payer: PPO | Admitting: Podiatry

## 2022-05-31 DIAGNOSIS — M79674 Pain in right toe(s): Secondary | ICD-10-CM | POA: Diagnosis not present

## 2022-05-31 DIAGNOSIS — I739 Peripheral vascular disease, unspecified: Secondary | ICD-10-CM | POA: Diagnosis not present

## 2022-05-31 DIAGNOSIS — B351 Tinea unguium: Secondary | ICD-10-CM | POA: Diagnosis not present

## 2022-05-31 DIAGNOSIS — L84 Corns and callosities: Secondary | ICD-10-CM

## 2022-05-31 DIAGNOSIS — M79675 Pain in left toe(s): Secondary | ICD-10-CM

## 2022-06-01 NOTE — Progress Notes (Signed)
Subjective: 71 y.o. returns the office today for painful, elongated, thickened toenails which he cannot trim himself.  States his big toenail are still getting ingrown.  Denies any swelling or redness or any drainage.  He has no other concerns today to his feet.  PCP: Biagio Borg, MD Last Seen: February 06, 2022  A1c: 5.6 on Jan 24, 2022  Objective: AAO 3, NAD DP/PT pulses 1/4, CRT less than 3 seconds Nails hypertrophic, dystrophic, elongated, brittle, discolored 10. There is tenderness overlying the nails 1-5 bilaterally. There is no surrounding erythema or drainage along the nail sites.  Incurvation of the hallux toenail without any edema, erythema, drainage or pus or signs of infection. Mild hyperkeratotic tissue at the distal aspect of the right hallux with some dried blood under the callus but there is no open lesion or any drainage noted. No pain with calf compression, swelling, warmth, erythema.  Assessment: Patient presents with symptomatic onychomycosis, ingrown toenail, PAD  Plan: -Treatment options including alternatives, risks, complications were discussed -Nails sharply debrided 10 without complication/bleeding. -Small callus at the distal aspect the right hallux which is preulcerative.  I sharply debrided this without any complications or bleeding.  Continue offloading. -Discussed daily foot inspection. If there are any changes, to call the office immediately.  -Follow-up in 3 months or sooner if any problems are to arise. In the meantime, encouraged to call the office with any questions, concerns, changes symptoms.  Celesta Gentile, DPM

## 2022-06-16 ENCOUNTER — Other Ambulatory Visit: Payer: Self-pay | Admitting: Internal Medicine

## 2022-06-19 ENCOUNTER — Telehealth: Payer: Self-pay | Admitting: Internal Medicine

## 2022-06-19 ENCOUNTER — Other Ambulatory Visit: Payer: Self-pay

## 2022-06-19 MED ORDER — POLYSACCHARIDE IRON COMPLEX 150 MG PO CAPS: 150.0000 mg | ORAL_CAPSULE | Freq: Two times a day (BID) | ORAL | 1 refills | Status: DC

## 2022-06-19 NOTE — Telephone Encounter (Signed)
Medication send 06/19/22

## 2022-06-19 NOTE — Telephone Encounter (Signed)
Pt is requesting a refill of his iron polysaccharides (NU-IRON) 150 MG capsule  Last OV 7.7.23  Please send RX to [id number] Phone:  [phone] Fax:  [phone]

## 2022-08-05 ENCOUNTER — Other Ambulatory Visit: Payer: Self-pay | Admitting: *Deleted

## 2022-08-05 DIAGNOSIS — I7025 Atherosclerosis of native arteries of other extremities with ulceration: Secondary | ICD-10-CM

## 2022-08-05 DIAGNOSIS — I739 Peripheral vascular disease, unspecified: Secondary | ICD-10-CM

## 2022-08-12 ENCOUNTER — Encounter (HOSPITAL_COMMUNITY): Payer: Self-pay

## 2022-08-12 ENCOUNTER — Ambulatory Visit (HOSPITAL_COMMUNITY)
Admission: RE | Admit: 2022-08-12 | Discharge: 2022-08-12 | Disposition: A | Discharge status: Home / Self Care | Payer: PPO | Source: Ambulatory Visit | Attending: Vascular Surgery | Admitting: Vascular Surgery

## 2022-08-12 ENCOUNTER — Ambulatory Visit: Payer: PPO | Admitting: Physician Assistant

## 2022-08-12 ENCOUNTER — Ambulatory Visit (INDEPENDENT_AMBULATORY_CARE_PROVIDER_SITE_OTHER)
Admission: RE | Admit: 2022-08-12 | Discharge: 2022-08-12 | Disposition: A | Discharge status: Home / Self Care | Payer: PPO | Source: Ambulatory Visit | Attending: Vascular Surgery | Admitting: Vascular Surgery

## 2022-08-12 VITALS — BP 144/66 | HR 72 | Temp 97.4°F | Resp 16 | Ht 70.0 in | Wt 165.0 lb

## 2022-08-12 DIAGNOSIS — I7025 Atherosclerosis of native arteries of other extremities with ulceration: Secondary | ICD-10-CM | POA: Insufficient documentation

## 2022-08-12 DIAGNOSIS — I739 Peripheral vascular disease, unspecified: Secondary | ICD-10-CM | POA: Insufficient documentation

## 2022-08-12 NOTE — Progress Notes (Signed)
Established Previous Bypass   History of Present Illness   Joshua Curtis is a 71 y.o. (1951-01-04) male who is here for PAD follow-up.  He has a history of left SFA and popliteal stent by Dr. Trula Slade on 07/04/2020.  He is also s/p right iliofemoral endarterectomy and vein patch angioplasty with femoral to peroneal artery composite bypass with PTFE and vein by Dr. Trula Slade on 04/13/2021.  This was done for right great toe ulceration, which is now well-healed.  He is followed by podiatrist Dr. Jacqualyn Posey.  He has previously taken aspirin, however has had to stop recently due to GI bleed.  At follow-up today, the patient is doing well.  He denies any symptoms of claudication, rest pain, nonhealing wounds of the lower extremities.  He has no recent changes to his health.  He is still taking his daily statin.  Current Outpatient Medications  Medication Sig Dispense Refill   amLODipine-benazepril (LOTREL) 10-20 MG capsule Take 1 capsule by mouth daily. 90 capsule 3   ferrous sulfate 325 (65 FE) MG tablet Take 325 mg by mouth daily with breakfast.     rosuvastatin (CRESTOR) 40 MG tablet Take 1 tablet (40 mg total) by mouth every morning. 90 tablet 3   iron polysaccharides (NU-IRON) 150 MG capsule Take 1 capsule (150 mg total) by mouth 2 (two) times daily. (Patient not taking: Reported on 08/12/2022) 180 capsule 1   pantoprazole (PROTONIX) 40 MG tablet Take 1 tablet (40 mg total) by mouth 2 (two) times daily. 180 tablet 3   polyethylene glycol powder (MIRALAX) 17 GM/SCOOP powder Take 17 g by mouth 2 (two) times daily as needed for moderate constipation or mild constipation. (Patient not taking: Reported on 08/12/2022) 255 g 0   No current facility-administered medications for this visit.    REVIEW OF SYSTEMS (negative unless checked):   Cardiac:  '[]'$  Chest pain or chest pressure? '[]'$  Shortness of breath upon activity? '[]'$  Shortness of breath when lying flat? '[]'$  Irregular heart rhythm?  Vascular:   '[]'$  Pain in calf, thigh, or hip brought on by walking? '[]'$  Pain in feet at night that wakes you up from your sleep? '[]'$  Blood clot in your veins? '[]'$  Leg swelling?  Pulmonary:  '[]'$  Oxygen at home? '[]'$  Productive cough? '[]'$  Wheezing?  Neurologic:  '[]'$  Sudden weakness in arms or legs? '[]'$  Sudden numbness in arms or legs? '[]'$  Sudden onset of difficult speaking or slurred speech? '[]'$  Temporary loss of vision in one eye? '[]'$  Problems with dizziness?  Gastrointestinal:  '[]'$  Blood in stool? '[]'$  Vomited blood?  Genitourinary:  '[]'$  Burning when urinating? '[]'$  Blood in urine?  Psychiatric:  '[]'$  Major depression  Hematologic:  '[]'$  Bleeding problems? '[]'$  Problems with blood clotting?  Dermatologic:  '[]'$  Rashes or ulcers?  Constitutional:  '[]'$  Fever or chills?  Ear/Nose/Throat:  '[]'$  Change in hearing? '[]'$  Nose bleeds? '[]'$  Sore throat?  Musculoskeletal:  '[]'$  Back pain? '[]'$  Joint pain? '[]'$  Muscle pain?   Physical Examination   Vitals:   08/12/22 0840  BP: (!) 144/66  Pulse: 72  Resp: 16  Temp: (!) 97.4 F (36.3 C)  TempSrc: Temporal  SpO2: 96%  Weight: 165 lb (74.8 kg)  Height: '5\' 10"'$  (1.778 m)   Body mass index is 23.68 kg/m.  General:  WDWN in NAD; vital signs documented above Gait: Not observed HENT: WNL, normocephalic Pulmonary: normal non-labored breathing , without Rales, rhonchi,  wheezing Cardiac: regular HR, without murmurs without carotid bruit Abdomen: soft, NT,  no masses Skin: without rashes Vascular Exam/Pulses: Brisk PT/DP Doppler signals bilaterally Extremities: without ischemic changes, without gangrene , without cellulitis; without open wounds;  Musculoskeletal: no muscle wasting or atrophy  Neurologic: A&O X 3;  No focal weakness or paresthesias are detected Psychiatric:  The pt has Normal affect.  Non-Invasive Vascular Imaging ABI (08/12/2022)  R ABI: 1.16 R TBI: 0.54  L ABI: 0.94 LTBI: 0.37   RLE Bypass Duplex (08/12/2022) Right: Patent  femoral-peroneal artery bypass graft without evidence of hemodynamically significant stenosis.   Medical Decision Making   Joshua Curtis is a 71 y.o. male who presents for follow-up of PAD  Based on the patient's vascular studies, his right ABI is essentially unchanged since his last visit from 1.23 to 1.16 today.  His left ABI is essentially unchanged from 0.97 to 0.94.  He has multiphasic PT/DP Doppler signals bilaterally Duplex study demonstrates patent right femoral to peroneal artery bypass graft without significant stenosis He is without rest pain, claudication, nonhealing wounds of the lower extremities He is currently taking a statin.  He cannot take aspirin at this time due to recent GI bleed He can follow-up with our office in 1 year with repeat ABIs and right lower extremity bypass graft duplex  Vicente Serene PA-C [facility] Office: [facility] MD: Trula Slade

## 2022-08-28 DIAGNOSIS — C61 Malignant neoplasm of prostate: Secondary | ICD-10-CM | POA: Diagnosis not present

## 2022-09-03 ENCOUNTER — Ambulatory Visit (INDEPENDENT_AMBULATORY_CARE_PROVIDER_SITE_OTHER): Payer: PPO

## 2022-09-03 VITALS — Ht 70.0 in | Wt 160.0 lb

## 2022-09-03 DIAGNOSIS — N5201 Erectile dysfunction due to arterial insufficiency: Secondary | ICD-10-CM | POA: Diagnosis not present

## 2022-09-03 DIAGNOSIS — Z0000 Encounter for general adult medical examination without abnormal findings: Secondary | ICD-10-CM | POA: Diagnosis not present

## 2022-09-03 DIAGNOSIS — C61 Malignant neoplasm of prostate: Secondary | ICD-10-CM | POA: Diagnosis not present

## 2022-09-03 DIAGNOSIS — N3000 Acute cystitis without hematuria: Secondary | ICD-10-CM | POA: Diagnosis not present

## 2022-09-03 NOTE — Patient Instructions (Signed)
Joshua Curtis , Thank you for taking time to come for your Medicare Wellness Visit. I appreciate your ongoing commitment to your health goals. Please review the following plan we discussed and let me know if I can assist you in the future.   These are the goals we discussed:  Goals      My goal for 2024 is to maintain my health and stay healthy.        This is a list of the screening recommended for you and due dates:  Health Maintenance  Topic Date Due   Zoster (Shingles) Vaccine (1 of 2) Never done   Flu Shot  04/02/2022   COVID-19 Vaccine (6 - 2023-24 season) 09/05/2022   Medicare Annual Wellness Visit  09/04/2023   Colon Cancer Screening  01/18/2025   DTaP/Tdap/Td vaccine (3 - Td or Tdap) 09/06/2030   Pneumonia Vaccine  Completed   Hepatitis C Screening: USPSTF Recommendation to screen - Ages 65-79 yo.  Completed   HPV Vaccine  Aged Out    Advanced directives: No; in the process of completing.  Conditions/risks identified: Yes  Next appointment: Follow up in one year for your annual wellness visit.   Preventive Care 46 Years and Older, Male  Preventive care refers to lifestyle choices and visits with your health care provider that can promote health and wellness. What does preventive care include? A yearly physical exam. This is also called an annual well check. Dental exams once or twice a year. Routine eye exams. Ask your health care provider how often you should have your eyes checked. Personal lifestyle choices, including: Daily care of your teeth and gums. Regular physical activity. Eating a healthy diet. Avoiding tobacco and drug use. Limiting alcohol use. Practicing safe sex. Taking low doses of aspirin every day. Taking vitamin and mineral supplements as recommended by your health care provider. What happens during an annual well check? The services and screenings done by your health care provider during your annual well check will depend on your age, overall  health, lifestyle risk factors, and family history of disease. Counseling  Your health care provider may ask you questions about your: Alcohol use. Tobacco use. Drug use. Emotional well-being. Home and relationship well-being. Sexual activity. Eating habits. History of falls. Memory and ability to understand (cognition). Work and work Statistician. Screening  You may have the following tests or measurements: Height, weight, and BMI. Blood pressure. Lipid and cholesterol levels. These may be checked every 5 years, or more frequently if you are over 70 years old. Skin check. Lung cancer screening. You may have this screening every year starting at age 70 if you have a 30-pack-year history of smoking and currently smoke or have quit within the past 15 years. Fecal occult blood test (FOBT) of the stool. You may have this test every year starting at age 20. Flexible sigmoidoscopy or colonoscopy. You may have a sigmoidoscopy every 5 years or a colonoscopy every 10 years starting at age 18. Prostate cancer screening. Recommendations will vary depending on your family history and other risks. Hepatitis C blood test. Hepatitis B blood test. Sexually transmitted disease (STD) testing. Diabetes screening. This is done by checking your blood sugar (glucose) after you have not eaten for a while (fasting). You may have this done every 1-3 years. Abdominal aortic aneurysm (AAA) screening. You may need this if you are a current or former smoker. Osteoporosis. You may be screened starting at age 58 if you are at high risk. Talk  with your health care provider about your test results, treatment options, and if necessary, the need for more tests. Vaccines  Your health care provider may recommend certain vaccines, such as: Influenza vaccine. This is recommended every year. Tetanus, diphtheria, and acellular pertussis (Tdap, Td) vaccine. You may need a Td booster every 10 years. Zoster vaccine. You may  need this after age 40. Pneumococcal 13-valent conjugate (PCV13) vaccine. One dose is recommended after age 45. Pneumococcal polysaccharide (PPSV23) vaccine. One dose is recommended after age 97. Talk to your health care provider about which screenings and vaccines you need and how often you need them. This information is not intended to replace advice given to you by your health care provider. Make sure you discuss any questions you have with your health care provider. Document Released: 09/15/2015 Document Revised: 05/08/2016 Document Reviewed: 06/20/2015 Elsevier Interactive Patient Education  2017 [place] Prevention in the Home Falls can cause injuries. They can happen to people of all ages. There are many things you can do to make your home safe and to help prevent falls. What can I do on the outside of my home? Regularly fix the edges of walkways and driveways and fix any cracks. Remove anything that might make you trip as you walk through a door, such as a raised step or threshold. Trim any bushes or trees on the path to your home. Use bright outdoor lighting. Clear any walking paths of anything that might make someone trip, such as rocks or tools. Regularly check to see if handrails are loose or broken. Make sure that both sides of any steps have handrails. Any raised decks and porches should have guardrails on the edges. Have any leaves, snow, or ice cleared regularly. Use sand or salt on walking paths during winter. Clean up any spills in your garage right away. This includes oil or grease spills. What can I do in the bathroom? Use night lights. Install grab bars by the toilet and in the tub and shower. Do not use towel bars as grab bars. Use non-skid mats or decals in the tub or shower. If you need to sit down in the shower, use a plastic, non-slip stool. Keep the floor dry. Clean up any water that spills on the floor as soon as it happens. Remove soap buildup in  the tub or shower regularly. Attach bath mats securely with double-sided non-slip rug tape. Do not have throw rugs and other things on the floor that can make you trip. What can I do in the bedroom? Use night lights. Make sure that you have a light by your bed that is easy to reach. Do not use any sheets or blankets that are too big for your bed. They should not hang down onto the floor. Have a firm chair that has side arms. You can use this for support while you get dressed. Do not have throw rugs and other things on the floor that can make you trip. What can I do in the kitchen? Clean up any spills right away. Avoid walking on wet floors. Keep items that you use a lot in easy-to-reach places. If you need to reach something above you, use a strong step stool that has a grab bar. Keep electrical cords out of the way. Do not use floor polish or wax that makes floors slippery. If you must use wax, use non-skid floor wax. Do not have throw rugs and other things on the floor that can make you  trip. What can I do with my stairs? Do not leave any items on the stairs. Make sure that there are handrails on both sides of the stairs and use them. Fix handrails that are broken or loose. Make sure that handrails are as long as the stairways. Check any carpeting to make sure that it is firmly attached to the stairs. Fix any carpet that is loose or worn. Avoid having throw rugs at the top or bottom of the stairs. If you do have throw rugs, attach them to the floor with carpet tape. Make sure that you have a light switch at the top of the stairs and the bottom of the stairs. If you do not have them, ask someone to add them for you. What else can I do to help prevent falls? Wear shoes that: Do not have high heels. Have rubber bottoms. Are comfortable and fit you well. Are closed at the toe. Do not wear sandals. If you use a stepladder: Make sure that it is fully opened. Do not climb a closed  stepladder. Make sure that both sides of the stepladder are locked into place. Ask someone to hold it for you, if possible. Clearly mark and make sure that you can see: Any grab bars or handrails. First and last steps. Where the edge of each step is. Use tools that help you move around (mobility aids) if they are needed. These include: Canes. Walkers. Scooters. Crutches. Turn on the lights when you go into a dark area. Replace any light bulbs as soon as they burn out. Set up your furniture so you have a clear path. Avoid moving your furniture around. If any of your floors are uneven, fix them. If there are any pets around you, be aware of where they are. Review your medicines with your doctor. Some medicines can make you feel dizzy. This can increase your chance of falling. Ask your doctor what other things that you can do to help prevent falls. This information is not intended to replace advice given to you by your health care provider. Make sure you discuss any questions you have with your health care provider. Document Released: 06/15/2009 Document Revised: 01/25/2016 Document Reviewed: 09/23/2014 Elsevier Interactive Patient Education  2017 [employer].

## 2022-09-03 NOTE — Progress Notes (Signed)
Virtual Visit via Telephone Note  I connected with  Joshua Curtis on 09/03/22 at  3:00 PM EST by telephone and verified that I am speaking with the correct person using two identifiers.  Location: Patient: Home Provider: [place] Persons participating in the virtual visit: [place]   I discussed the limitations, risks, security and privacy concerns of performing an evaluation and management service by telephone and the availability of in person appointments. The patient expressed understanding and agreed to proceed.  Interactive audio and video telecommunications were attempted between this nurse and patient, however failed, due to patient having technical difficulties OR patient did not have access to video capability.  We continued and completed visit with audio only.  Some vital signs may be absent or patient reported.   Sheral Flow, LPN  Subjective:   Joshua Curtis is a 72 y.o. male who presents for an Initial Medicare Annual Wellness Visit.  Review of Systems     Cardiac Risk Factors include: advanced age (>75mn, >>24women);dyslipidemia;family history of premature cardiovascular disease;hypertension;male gender     Objective:    Today's Vitals   09/03/22 1502  Weight: 160 lb (72.6 kg)  Height: '5\' 10"'$  (1.778 m)  PainSc: 0-No pain   Body mass index is 22.96 kg/m.     09/03/2022    3:05 PM 12/08/2021    9:41 AM 04/13/2021    8:59 AM 01/08/2021    9:45 AM 01/01/2021   11:17 AM 07/18/2020    6:08 AM 07/04/2020   12:47 PM  Advanced Directives  Does Patient Have a Medical Advance Directive? No No No No No Yes No  Type of ATeacher, early years/preLiving will   Would patient like information on creating a medical advance directive? No - Patient declined  No - Patient declined No - Patient declined No - Patient declined  No - Patient declined    Current Medications (verified) Outpatient Encounter Medications  as of 09/03/2022  Medication Sig   amLODipine-benazepril (LOTREL) 10-20 MG capsule Take 1 capsule by mouth daily.   ferrous sulfate 325 (65 FE) MG tablet Take 325 mg by mouth daily with breakfast.   polyethylene glycol powder (MIRALAX) 17 GM/SCOOP powder Take 17 g by mouth 2 (two) times daily as needed for moderate constipation or mild constipation.   rosuvastatin (CRESTOR) 40 MG tablet Take 1 tablet (40 mg total) by mouth every morning.   iron polysaccharides (NU-IRON) 150 MG capsule Take 1 capsule (150 mg total) by mouth 2 (two) times daily. (Patient not taking: Reported on 08/12/2022)   pantoprazole (PROTONIX) 40 MG tablet Take 1 tablet (40 mg total) by mouth 2 (two) times daily.   No facility-administered encounter medications on file as of 09/03/2022.    Allergies (verified) Patient has no known allergies.   History: Past Medical History:  Diagnosis Date   Anemia, iron deficiency 04/02/2016   Aortic atherosclerosis (HCC)    Arthritis    rt knee   BPH (benign prostatic hyperplasia) 04/10/2017   Cancer ([place]    prostate   COLONIC POLYPS, HX OF 11/24/2007   COPD (chronic obstructive pulmonary disease) (HCC)    pt denies this- no respiratory issues of any kind per pt   Coronary artery calcification seen on CAT scan    DIVERTICULOSIS, COLON 11/24/2007   ED (erectile dysfunction)    GERD (gastroesophageal reflux disease)    HLD (hyperlipidemia) 09/06/2020   HYPERTENSION 11/24/2007  Increased prostate specific antigen (PSA) velocity 06/22/2011   PAH (pulmonary artery hypertension) ([place])    Peripheral vascular disease ([place])    Past Surgical History:  Procedure Laterality Date   ABDOMINAL AORTOGRAM W/LOWER EXTREMITY N/A 07/04/2020   Procedure: ABDOMINAL AORTOGRAM W/LOWER EXTREMITY;  Surgeon: Serafina Mitchell, MD;  Location: [facility];  Service: Cardiovascular;  Laterality: N/A;   ABDOMINAL AORTOGRAM W/LOWER EXTREMITY N/A 07/18/2020   Procedure: ABDOMINAL AORTOGRAM W/LOWER  EXTREMITY;  Surgeon: Serafina Mitchell, MD;  Location: [facility];  Service: Cardiovascular;  Laterality: N/A;   ABDOMINAL AORTOGRAM W/LOWER EXTREMITY N/A 04/04/2021   Procedure: ABDOMINAL AORTOGRAM W/LOWER EXTREMITY;  Surgeon: Cherre Robins, MD;  Location: [facility];  Service: Cardiovascular;  Laterality: N/A;   BYPASS GRAFT FEMORAL-PERONEAL Right 04/13/2021   Procedure: RIGHT LEG FEMORAL-PERONEAL BYPASS USING PROPATEN GRAFT;  Surgeon: Serafina Mitchell, MD;  [facility];  Service: Vascular;  Laterality: Right;   COLONOSCOPY     ESOPHAGOGASTRODUODENOSCOPY (EGD) WITH PROPOFOL N/A 12/09/2021   Procedure: ESOPHAGOGASTRODUODENOSCOPY (EGD) WITH PROPOFOL;  Surgeon: Carol Ada, MD;  Location: [facility];  Service: Gastroenterology;  Laterality: N/A;   HOT HEMOSTASIS N/A 12/09/2021   Procedure: HOT HEMOSTASIS (ARGON PLASMA COAGULATION/BICAP);  Surgeon: Carol Ada, MD;  Location: [facility];  Service: Gastroenterology;  Laterality: N/A;   NO PAST SURGERIES     PATCH ANGIOPLASTY Right 04/13/2021   Procedure: ILIEO-FEMORAL USING VEIN PATCH ANGIOPLASTY;  Surgeon: Serafina Mitchell, MD;  Location: [facility];  Service: Vascular;  Laterality: Right;   PERIPHERAL VASCULAR BALLOON ANGIOPLASTY Right 07/18/2020   Procedure: PERIPHERAL VASCULAR BALLOON ANGIOPLASTY;  Surgeon: Serafina Mitchell, MD;  Location: [facility];  Service: Cardiovascular;  Laterality: Right;  tibioperoneal trunk and posterior tibial   PERIPHERAL VASCULAR INTERVENTION  07/04/2020   Procedure: PERIPHERAL VASCULAR INTERVENTION;  Surgeon: Serafina Mitchell, MD;  Location: [facility];  Service: Cardiovascular;;  Lt. SFA and Popliteal   PERIPHERAL VASCULAR INTERVENTION Right 07/18/2020   Procedure: PERIPHERAL VASCULAR INTERVENTION;  Surgeon: Serafina Mitchell, MD;  Location: [facility];  Service: Cardiovascular;  Laterality: Right;  Femoral Popliteal   ROBOT ASSISTED LAPAROSCOPIC RADICAL PROSTATECTOMY N/A  01/08/2021   Procedure: XI ROBOTIC ASSISTED LAPAROSCOPIC RADICAL PROSTATECTOMY LEVEL 3;  Surgeon: Raynelle Bring, MD;  [facility];  Service: Urology;  Laterality: N/A;   SCLEROTHERAPY  12/09/2021   Procedure: Clide Deutscher;  Surgeon: Carol Ada, MD;  Location: [facility];  Service: Gastroenterology;;   Family History  Problem Relation Age of Onset   Stroke Mother    Diabetes Father    Hypertension Sister    Kidney failure Brother        dialysis   Diabetes Paternal Grandfather    Anuerysm Brother    Colon cancer Neg Hx    Esophageal cancer Neg Hx    Rectal cancer Neg Hx    Stomach cancer Neg Hx    Social History   Socioeconomic History   Marital status: Married    Spouse name: Not on file   Number of children: 2   Years of education: Not on file   Highest education level: Not on file  Occupational History   Not on file  Tobacco Use   Smoking status: Former    Packs/day: 0.25    Types: Cigarettes    Quit date: 04/22/2021    Years since quitting: 1.3   Smokeless tobacco: Never   Tobacco comments:    tryimg  to quit  Vaping Use   Vaping Use: Never used  Substance and Sexual Activity   Alcohol use: Not Currently    Comment: beer rare   Drug use: No   Sexual activity: Not Currently  Other Topics Concern   Not on file  Social History Narrative   Not on file   Social Determinants of Health   Financial Resource Strain: Low Risk  (09/03/2022)   Overall Financial Resource Strain (CARDIA)    Difficulty of Paying Living Expenses: Not hard at all  Food Insecurity: No Food Insecurity (09/03/2022)   Hunger Vital Sign    Worried About Running Out of Food in the Last Year: Never true    Ran Out of Food in the Last Year: Never true  Transportation Needs: No Transportation Needs (09/03/2022)   PRAPARE - Hydrologist (Medical): No    Lack of Transportation (Non-Medical): No  Physical Activity: Sufficiently Active (09/03/2022)   Exercise Vital Sign     Days of Exercise per Week: 5 days    Minutes of Exercise per Session: 30 min  Stress: No Stress Concern Present (09/03/2022)   [place]    Feeling of Stress : Not at all  Social Connections: [place] (09/03/2022)   Social Connection and Isolation Panel [NHANES]    Frequency of Communication with Friends and Family: More than three times a week    Frequency of Social Gatherings with Friends and Family: More than three times a week    Attends Religious Services: More than 4 times per year    Active Member of [employer] or Organizations: Yes    Attends Music therapist: More than 4 times per year    Marital Status: Married    Tobacco Counseling Counseling given: Not Answered Tobacco comments: tryimg to quit   Clinical Intake:  Pre-visit preparation completed: Yes  Pain : No/denies pain Pain Score: 0-No pain     BMI - recorded: 22.96 Nutritional Status: BMI of 19-24  Normal Nutritional Risks: None Diabetes: No  How often do you need to have someone help you when you read instructions, pamphlets, or other written materials from your doctor or pharmacy?: 1 - Never What is the last grade level you completed in school?: HS[employer]; Bachelor's Degree  Diabetic? no  Interpreter Needed?: No  Information entered by :: Lisette Abu, LPN.   Activities of Daily Living    09/03/2022    3:08 PM 12/10/2021   11:48 AM  In your present state of health, do you have any difficulty performing the following activities:  Hearing? 0 0  Vision? 0 0  Difficulty concentrating or making decisions? 0 0  Walking or climbing stairs? 0 0  Dressing or bathing? 0 0  Doing errands, shopping? 0 0  Preparing Food and eating ? N   Using the Toilet? N   In the past six months, have you accidently leaked urine? N   Do you have problems with loss of bowel control? N   Managing your Medications? N   Managing your  Finances? N   Housekeeping or managing your Housekeeping? N     Patient Care Team: Biagio Borg, MD as PCP - General Stanford Breed, Denice Bors, MD as PCP - Cardiology (Cardiology) [place] as Consulting Physician (Optometry) Jacqualyn Posey, Bonna Gains, DPM as Consulting Physician (Podiatry) Raynelle Bring, MD as Consulting Physician (Urology) Carol Ada, MD as Consulting Physician (Gastroenterology)  Indicate any recent Medical Services you may have received from other than [facility] providers in the past year (date may be approximate).     Assessment:   This is a routine wellness examination for [facility].  Hearing/Vision screen Hearing Screening - Comments:: Denies hearing difficulties   Vision Screening - Comments:: Wears rx glasses - up to date with routine eye exams with [facility]   Dietary issues and exercise activities discussed: Current Exercise Habits: Home exercise routine, Type of exercise: walking, Time (Minutes): 30, Frequency (Times/Week): 5, Weekly Exercise (Minutes/Week): 150, Intensity: Moderate, Exercise limited by: cardiac condition(s);respiratory conditions(s)   Goals Addressed             This Visit's Progress    My goal for 2024 is to maintain my health and stay healthy.        Depression Screen    09/03/2022    3:07 PM 03/08/2022   10:48 AM 01/24/2022   11:58 AM 09/07/2021   10:35 AM 03/07/2021   10:04 AM 03/07/2021    9:47 AM 09/06/2020    9:55 AM  PHQ 2/9 Scores  PHQ - 2 Score 0 0 0 0 1 0 0  PHQ- 9 Score  2         Fall Risk    09/03/2022    3:06 PM 03/08/2022   10:48 AM 01/24/2022   11:58 AM 09/07/2021   10:35 AM 03/07/2021   10:04 AM  Fall Risk   Falls in the past year? 0 0 0 0 0  Number falls in past yr: 0 0 0 0 0  Injury with Fall? 0 0 0 0 0  Risk for fall due to : No Fall Risks      Follow up Falls prevention discussed        FALL RISK PREVENTION PERTAINING TO THE HOME:  Any stairs in or around the home? No  If so, are there any without  handrails? No  Home free of loose throw rugs in walkways, pet beds, electrical cords, etc? Yes  Adequate lighting in your home to reduce risk of falls? Yes   ASSISTIVE DEVICES UTILIZED TO PREVENT FALLS:  Life alert? No  Use of a cane, walker or w/c? No  Grab bars in the bathroom? Yes  Shower chair or bench in shower? No  Elevated toilet seat or a handicapped toilet? Yes   TIMED UP AND GO:  Was the test performed? No . Phone Visit  Cognitive Function:        09/03/2022    3:07 PM  6CIT Screen  What Year? 0 points  What month? 0 points  What time? 0 points  Count back from 20 0 points  Months in reverse 0 points  Repeat phrase 0 points  Total Score 0 points    Immunizations Immunization History  Administered Date(s) Administered   Fluad Quad(high Dose 65+) 04/21/2019, 09/06/2020   Influenza Split 06/21/2011   Influenza, High Dose Seasonal PF 10/03/2015, 10/16/2018, 06/22/2021   Influenza, Seasonal, Injecte, Preservative Fre 09/25/2012   Influenza,inj,Quad PF,6+ Mos 09/28/2013, 09/29/2014   PFIZER(Purple Top)SARS-COV-2 Vaccination 11/13/2019, 12/08/2019, 07/28/2020   Pfizer Covid-19 Vaccine Bivalent Booster 53yr & up 06/22/2021   Pneumococcal Conjugate-13 10/16/2018   Pneumococcal Polysaccharide-23 03/07/2021   Td 03/14/2010   Tdap 09/06/2020    TDAP status: Up to date  Flu Vaccine status: Due, Education has been provided regarding the importance of this vaccine. Advised may receive this vaccine at local pharmacy or Health Dept. Aware to  provide a copy of the vaccination record if obtained from local pharmacy or Health Dept. Verbalized acceptance and understanding.  Pneumococcal vaccine status: Up to date  Covid-19 vaccine status: Completed vaccines  Qualifies for Shingles Vaccine? Yes   Zostavax completed No   Shingrix Completed?: No.    Education has been provided regarding the importance of this vaccine. Patient has been advised to call insurance company to  determine out of pocket expense if they have not yet received this vaccine. Advised may also receive vaccine at local pharmacy or Health Dept. Verbalized acceptance and understanding.  Screening Tests Health Maintenance  Topic Date Due   Zoster Vaccines- Shingrix (1 of 2) Never done   INFLUENZA VACCINE  04/02/2022   COVID-19 Vaccine (6 - 2023-24 season) 09/05/2022   Medicare Annual Wellness (AWV)  09/04/2023   COLONOSCOPY (Pts 45-66yr Insurance coverage will need to be confirmed)  01/18/2025   DTaP/Tdap/Td (3 - Td or Tdap) 09/06/2030   Pneumonia Vaccine 71 Years old  Completed   Hepatitis C Screening  Completed   HPV VACCINES  Aged Out    Health Maintenance  Health Maintenance Due  Topic Date Due   Zoster Vaccines- Shingrix (1 of 2) Never done   INFLUENZA VACCINE  04/02/2022   COVID-19 Vaccine (6 - 2023-24 season) 09/05/2022    Colorectal cancer screening: Type of screening: Colonoscopy. Completed 01/19/2020. Repeat every 5 years  Lung Cancer Screening: (Low Dose CT Chest recommended if Age 70-80years, 30 pack-year currently smoking OR have quit w/in 15years.) does not qualify.   Lung Cancer Screening Referral: no  Additional Screening:  Hepatitis C Screening: does qualify; Completed 10/03/2015  Vision Screening: Recommended annual ophthalmology exams for early detection of glaucoma and other disorders of the eye. Is the patient up to date with their annual eye exam?  Yes  Who is the provider or what is the name of the office in which the patient attends annual eye exams? [facility] If pt is not established with a provider, would they like to be referred to a provider to establish care? No .   Dental Screening: Recommended annual dental exams for proper oral hygiene  Community Resource Referral / Chronic Care Management: CRR required this visit?  No   CCM required this visit?  No .     Plan:     I have personally reviewed and noted the following in the patient's  chart:   Medical and social history Use of alcohol, tobacco or illicit drugs  Current medications and supplements including opioid prescriptions. Patient is not currently taking opioid prescriptions. Functional ability and status Nutritional status Physical activity Advanced directives List of other physicians Hospitalizations, surgeries, and ER visits in previous 12 months Vitals Screenings to include cognitive, depression, and falls Referrals and appointments  In addition, I have reviewed and discussed with patient certain preventive protocols, quality metrics, and best practice recommendations. A written personalized care plan for preventive services as well as general preventive health recommendations were provided to patient.     SSheral Flow LPN   14/12/345  Nurse Notes: N/A

## 2022-09-06 ENCOUNTER — Ambulatory Visit: Payer: PPO | Admitting: Podiatry

## 2022-09-06 VITALS — BP 134/69 | HR 82

## 2022-09-06 DIAGNOSIS — M79674 Pain in right toe(s): Secondary | ICD-10-CM

## 2022-09-06 DIAGNOSIS — I739 Peripheral vascular disease, unspecified: Secondary | ICD-10-CM | POA: Diagnosis not present

## 2022-09-06 DIAGNOSIS — B351 Tinea unguium: Secondary | ICD-10-CM

## 2022-09-06 DIAGNOSIS — M79675 Pain in left toe(s): Secondary | ICD-10-CM | POA: Diagnosis not present

## 2022-09-08 NOTE — Progress Notes (Signed)
Subjective: 72 y.o. returns the office today for painful, elongated, thickened toenails which he cannot trim himself.  He was getting ingrowing at the toenails causing pain previously but has not been causing significant problems since I saw him last.  Denies any open sores.  No swelling redness or drainage.  No other concerns today.     PCP: Biagio Borg, MD Last Seen: February 06, 2022  A1c: 5.6 on Jan 24, 2022  Objective: AAO 3, NAD DP/PT pulses 1/4, CRT less than 3 seconds Nails hypertrophic, dystrophic, elongated, brittle, discolored 10. There is tenderness overlying the nails 1-5 bilaterally. There is no surrounding erythema or drainage along the nail sites.  Mild incurvation of the right hallux toenail worse than left.  There is no edema, erythema or signs of infection. No pain with calf compression, swelling, warmth, erythema.  Assessment: Patient presents with symptomatic onychomycosis, ingrown toenail, PAD  Plan: -Treatment options including alternatives, risks, complications were discussed -Nails sharply debrided 10 without complication/bleeding. -Discussed daily foot inspection. If there are any changes, to call the office immediately.  -Follow-up in 3 months or sooner if any problems are to arise. In the meantime, encouraged to call the office with any questions, concerns, changes symptoms.  Celesta Gentile, DPM

## 2022-09-09 ENCOUNTER — Ambulatory Visit (INDEPENDENT_AMBULATORY_CARE_PROVIDER_SITE_OTHER): Payer: PPO | Admitting: Internal Medicine

## 2022-09-09 ENCOUNTER — Encounter: Payer: Self-pay | Admitting: Internal Medicine

## 2022-09-09 VITALS — BP 130/64 | HR 81 | Temp 98.0°F | Ht 70.0 in | Wt 166.0 lb

## 2022-09-09 DIAGNOSIS — F17200 Nicotine dependence, unspecified, uncomplicated: Secondary | ICD-10-CM | POA: Diagnosis not present

## 2022-09-09 DIAGNOSIS — Z0000 Encounter for general adult medical examination without abnormal findings: Secondary | ICD-10-CM

## 2022-09-09 DIAGNOSIS — D509 Iron deficiency anemia, unspecified: Secondary | ICD-10-CM

## 2022-09-09 DIAGNOSIS — I10 Essential (primary) hypertension: Secondary | ICD-10-CM

## 2022-09-09 DIAGNOSIS — R739 Hyperglycemia, unspecified: Secondary | ICD-10-CM

## 2022-09-09 DIAGNOSIS — C61 Malignant neoplasm of prostate: Secondary | ICD-10-CM | POA: Diagnosis not present

## 2022-09-09 DIAGNOSIS — E7800 Pure hypercholesterolemia, unspecified: Secondary | ICD-10-CM | POA: Diagnosis not present

## 2022-09-09 DIAGNOSIS — E559 Vitamin D deficiency, unspecified: Secondary | ICD-10-CM

## 2022-09-09 DIAGNOSIS — E538 Deficiency of other specified B group vitamins: Secondary | ICD-10-CM | POA: Diagnosis not present

## 2022-09-09 DIAGNOSIS — Z23 Encounter for immunization: Secondary | ICD-10-CM | POA: Diagnosis not present

## 2022-09-09 DIAGNOSIS — Z0001 Encounter for general adult medical examination with abnormal findings: Secondary | ICD-10-CM

## 2022-09-09 LAB — CBC WITH DIFFERENTIAL/PLATELET
Basophils Absolute: 0.1 10*3/uL (ref 0.0–0.1)
Basophils Relative: 1 % (ref 0.0–3.0)
Eosinophils Absolute: 0.6 10*3/uL (ref 0.0–0.7)
Eosinophils Relative: 9.1 % — ABNORMAL HIGH (ref 0.0–5.0)
HCT: 36 % — ABNORMAL LOW (ref 39.0–52.0)
Hemoglobin: 12.2 g/dL — ABNORMAL LOW (ref 13.0–17.0)
Lymphocytes Relative: 27.3 % (ref 12.0–46.0)
Lymphs Abs: 1.9 10*3/uL (ref 0.7–4.0)
MCHC: 34 g/dL (ref 30.0–36.0)
MCV: 93.8 fl (ref 78.0–100.0)
Monocytes Absolute: 0.7 10*3/uL (ref 0.1–1.0)
Monocytes Relative: 9.5 % (ref 3.0–12.0)
Neutro Abs: 3.7 10*3/uL (ref 1.4–7.7)
Neutrophils Relative %: 53.1 % (ref 43.0–77.0)
Platelets: 221 10*3/uL (ref 150.0–400.0)
RBC: 3.84 Mil/uL — ABNORMAL LOW (ref 4.22–5.81)
RDW: 15.5 % (ref 11.5–15.5)
WBC: 7 10*3/uL (ref 4.0–10.5)

## 2022-09-09 LAB — TSH: TSH: 1.4 u[IU]/mL (ref 0.35–5.50)

## 2022-09-09 LAB — HEPATIC FUNCTION PANEL
ALT: 24 U/L (ref 0–53)
AST: 27 U/L (ref 0–37)
Albumin: 4.1 g/dL (ref 3.5–5.2)
Alkaline Phosphatase: 50 U/L (ref 39–117)
Bilirubin, Direct: 0.1 mg/dL (ref 0.0–0.3)
Total Bilirubin: 0.4 mg/dL (ref 0.2–1.2)
Total Protein: 6.7 g/dL (ref 6.0–8.3)

## 2022-09-09 LAB — LIPID PANEL
Cholesterol: 75 mg/dL (ref 0–200)
HDL: 30.7 mg/dL — ABNORMAL LOW (ref >39.00)
LDL Cholesterol: 17 mg/dL (ref 0–99)
NonHDL: 44.32
Total CHOL/HDL Ratio: 2
Triglycerides: 137 mg/dL (ref 0.0–149.0)
VLDL: 27.4 mg/dL (ref 0.0–40.0)

## 2022-09-09 LAB — IBC PANEL
Iron: 59 ug/dL (ref 42–165)
Saturation Ratios: 19.4 % — ABNORMAL LOW (ref 20.0–50.0)
TIBC: 303.8 ug/dL (ref 250.0–450.0)
Transferrin: 217 mg/dL (ref 212.0–360.0)

## 2022-09-09 LAB — URINALYSIS, ROUTINE W REFLEX MICROSCOPIC
Bilirubin Urine: NEGATIVE
Ketones, ur: NEGATIVE
Leukocytes,Ua: NEGATIVE
Nitrite: NEGATIVE
RBC / HPF: NONE SEEN (ref 0–?)
Specific Gravity, Urine: 1.02 (ref 1.000–1.030)
Total Protein, Urine: NEGATIVE
Urine Glucose: NEGATIVE
Urobilinogen, UA: 0.2 (ref 0.0–1.0)
pH: 5.5 (ref 5.0–8.0)

## 2022-09-09 LAB — BASIC METABOLIC PANEL
BUN: 23 mg/dL (ref 6–23)
CO2: 26 mEq/L (ref 19–32)
Calcium: 8.2 mg/dL — ABNORMAL LOW (ref 8.4–10.5)
Chloride: 108 mEq/L (ref 96–112)
Creatinine, Ser: 1.83 mg/dL — ABNORMAL HIGH (ref 0.40–1.50)
GFR: 37.2 mL/min — ABNORMAL LOW (ref >60.00)
Glucose, Bld: 98 mg/dL (ref 70–99)
Potassium: 3.9 mEq/L (ref 3.5–5.1)
Sodium: 142 mEq/L (ref 135–145)

## 2022-09-09 LAB — VITAMIN B12: Vitamin B-12: 261 pg/mL (ref 211–911)

## 2022-09-09 LAB — VITAMIN D 25 HYDROXY (VIT D DEFICIENCY, FRACTURES): VITD: 7.84 ng/mL — ABNORMAL LOW (ref 30.00–100.00)

## 2022-09-09 LAB — PSA: PSA: 0 ng/mL — ABNORMAL LOW (ref 0.10–4.00)

## 2022-09-09 LAB — MICROALBUMIN / CREATININE URINE RATIO
Creatinine,U: 104.4 mg/dL
Microalb Creat Ratio: 1.1 mg/g (ref 0.0–30.0)
Microalb, Ur: 1.2 mg/dL (ref 0.0–1.9)

## 2022-09-09 LAB — HEMOGLOBIN A1C: Hgb A1c MFr Bld: 5.9 % (ref 4.6–6.5)

## 2022-09-09 LAB — FERRITIN: Ferritin: 77.3 ng/mL (ref 22.0–322.0)

## 2022-09-09 NOTE — Assessment & Plan Note (Signed)
Age and sex appropriate education and counseling updated with regular exercise and diet Referrals for preventative services - none needed Immunizations addressed - declines covid booster, for shingrx at the pharmacy, for flu shot today Smoking counseling  - counseled to quit completely Evidence for depression or other mood disorder - none significant Most recent labs reviewed. I have personally reviewed and have noted: 1) the patient's medical and social history 2) The patient's current medications and supplements 3) The patient's height, weight, and BMI have been recorded in the chart

## 2022-09-09 NOTE — Assessment & Plan Note (Signed)
Also for f/u psa today, o/w asympt

## 2022-09-09 NOTE — Assessment & Plan Note (Signed)
No recent overt bleeding, now for iron with labs, consider renew iron rx

## 2022-09-09 NOTE — Assessment & Plan Note (Signed)
BP Readings from Last 3 Encounters:  09/09/22 130/64  09/06/22 134/69  08/12/22 (!) 144/66   Stable, pt to continue medical treatment lotrel 10-20 mg qd

## 2022-09-09 NOTE — Assessment & Plan Note (Signed)
Pt counsled to quit, pt not quite ready

## 2022-09-09 NOTE — Assessment & Plan Note (Signed)
Lab Results  Component Value Date   HGBA1C 5.6 01/24/2022   Stable, pt to continue current medical treatment  - diet, wt control

## 2022-09-09 NOTE — Assessment & Plan Note (Signed)
Lab Results  Component Value Date   LDLCALC 29 01/24/2022   Stable, pt to continue current statin crestor 20 mg qd, for f/u labs

## 2022-09-09 NOTE — Assessment & Plan Note (Signed)
Last vitamin D Lab Results  Component Value Date   VD25OH 13.21 (L) 01/24/2022   Low, to start oral replacement

## 2022-09-09 NOTE — Progress Notes (Signed)
Patient ID: Joshua Curtis, male   DOB: 1951-04-26, 72 y.o.   MRN: [id number]         Chief Complaint:: wellness exam and smoker, htn, iron def anemia, hld, htn, low vit d       HPI:  Joshua Curtis is a 72 y.o. male here for wellness exam; conts to smoke 1-2 cigs per day and trying to quit on his own, declines covid booster, for flu shot today, but may consider shingrix at the pharmacy, o/w up to date                        Also Pt denies chest pain, increased sob or doe, wheezing, orthopnea, PND, increased LE swelling, palpitations, dizziness or syncope.   Pt denies polydipsia, polyuria, or new focal neuro s/s.    Pt denies fever, wt loss, night sweats, loss of appetite, or other constitutional symptoms  No recent overt bleeding.     Wt Readings from Last 3 Encounters:  09/09/22 166 lb (75.3 kg)  09/03/22 160 lb (72.6 kg)  08/12/22 165 lb (74.8 kg)   BP Readings from Last 3 Encounters:  09/09/22 130/64  09/06/22 134/69  08/12/22 (!) 144/66   Immunization History  Administered Date(s) Administered   COVID-19, mRNA, vaccine(Comirnaty)12 years and older 07/11/2022   Fluad Quad(high Dose 65+) 04/21/2019, 09/06/2020   Influenza Split 06/21/2011   Influenza, High Dose Seasonal PF 10/03/2015, 10/16/2018, 06/22/2021   Influenza, Seasonal, Injecte, Preservative Fre 09/25/2012   Influenza,inj,Quad PF,6+ Mos 09/28/2013, 09/29/2014   PFIZER(Purple Top)SARS-COV-2 Vaccination 11/13/2019, 12/08/2019, 07/28/2020   Pfizer Covid-19 Vaccine Bivalent Booster 82yr & up 06/22/2021   Pneumococcal Conjugate-13 10/16/2018   Pneumococcal Polysaccharide-23 03/07/2021   Td 03/14/2010   Tdap 09/06/2020  There are no preventive care reminders to display for this patient.    Past Medical History:  Diagnosis Date   Anemia, iron deficiency 04/02/2016   Aortic atherosclerosis (HCC)    Arthritis    rt knee   BPH (benign prostatic hyperplasia) 04/10/2017   Cancer [place]    prostate   COLONIC POLYPS, HX OF  11/24/2007   COPD (chronic obstructive pulmonary disease) (HCC)    pt denies this- no respiratory issues of any kind per pt   Coronary artery calcification seen on CAT scan    DIVERTICULOSIS, COLON 11/24/2007   ED (erectile dysfunction)    GERD (gastroesophageal reflux disease)    HLD (hyperlipidemia) 09/06/2020   HYPERTENSION 11/24/2007   Increased prostate specific antigen (PSA) velocity 06/22/2011   PAH (pulmonary artery hypertension) ([place]    Peripheral vascular disease ([place]    Past Surgical History:  Procedure Laterality Date   ABDOMINAL AORTOGRAM W/LOWER EXTREMITY N/A 07/04/2020   Procedure: ABDOMINAL AORTOGRAM W/LOWER EXTREMITY;  Surgeon: BSerafina Mitchell MD;  [facility];  Service: Cardiovascular;  Laterality: N/A;   ABDOMINAL AORTOGRAM W/LOWER EXTREMITY N/A 07/18/2020   Procedure: ABDOMINAL AORTOGRAM W/LOWER EXTREMITY;  Surgeon: BSerafina Mitchell MD;  [facility];  Service: Cardiovascular;  Laterality: N/A;   ABDOMINAL AORTOGRAM W/LOWER EXTREMITY N/A 04/04/2021   Procedure: ABDOMINAL AORTOGRAM W/LOWER EXTREMITY;  Surgeon: HCherre Robins MD;  [facility];  Service: Cardiovascular;  Laterality: N/A;   BYPASS GRAFT FEMORAL-PERONEAL Right 04/13/2021   Procedure: RIGHT LEG FEMORAL-PERONEAL BYPASS USING PROPATEN GRAFT;  Surgeon: BSerafina Mitchell MD;  [facility];  Service: Vascular;  Laterality: Right;   COLONOSCOPY     ESOPHAGOGASTRODUODENOSCOPY (  EGD) WITH PROPOFOL N/A 12/09/2021   Procedure: ESOPHAGOGASTRODUODENOSCOPY (EGD) WITH PROPOFOL;  Surgeon: Carol Ada, MD;  [facility];  Service: Gastroenterology;  Laterality: N/A;   HOT HEMOSTASIS N/A 12/09/2021   Procedure: HOT HEMOSTASIS (ARGON PLASMA COAGULATION/BICAP);  Surgeon: Carol Ada, MD;  [facility];  Service: Gastroenterology;  Laterality: N/A;   NO PAST SURGERIES     PATCH ANGIOPLASTY Right 04/13/2021   Procedure: ILIEO-FEMORAL USING VEIN PATCH  ANGIOPLASTY;  Surgeon: Serafina Mitchell, MD;  [facility];  Service: Vascular;  Laterality: Right;   PERIPHERAL VASCULAR BALLOON ANGIOPLASTY Right 07/18/2020   Procedure: PERIPHERAL VASCULAR BALLOON ANGIOPLASTY;  Surgeon: Serafina Mitchell, MD;  Location: [place] [facility];  Service: Cardiovascular;  Laterality: Right;  tibioperoneal trunk and posterior tibial   PERIPHERAL VASCULAR INTERVENTION  07/04/2020   Procedure: PERIPHERAL VASCULAR INTERVENTION;  Surgeon: Serafina Mitchell, MD;  Location: [place] [facility];  Service: Cardiovascular;;  Lt. SFA and Popliteal   PERIPHERAL VASCULAR INTERVENTION Right 07/18/2020   Procedure: PERIPHERAL VASCULAR INTERVENTION;  Surgeon: Serafina Mitchell, MD;  Location: [place] [facility];  Service: Cardiovascular;  Laterality: Right;  Femoral Popliteal   ROBOT ASSISTED LAPAROSCOPIC RADICAL PROSTATECTOMY N/A 01/08/2021   Procedure: XI ROBOTIC ASSISTED LAPAROSCOPIC RADICAL PROSTATECTOMY LEVEL 3;  Surgeon: Raynelle Bring, MD;  [facility];  Service: Urology;  Laterality: N/A;   SCLEROTHERAPY  12/09/2021   Procedure: SCLEROTHERAPY;  Surgeon: Carol Ada, MD;  [facility];  Service: Gastroenterology;;    reports that he quit smoking about 16 months ago. His smoking use included cigarettes. He smoked an average of .25 packs per day. He has never used smokeless tobacco. He reports that he does not currently use alcohol. He reports that he does not use drugs. family history includes Anuerysm in his brother; Diabetes in his father and paternal grandfather; Hypertension in his sister; Kidney failure in his brother; Stroke in his mother. No Known Allergies Current Outpatient Medications on File Prior to Visit  Medication Sig Dispense Refill   amLODipine-benazepril (LOTREL) 10-20 MG capsule Take 1 capsule by mouth daily. 90 capsule 3   cephALEXin (KEFLEX) 500 MG capsule Take 500 mg by mouth 3 (three) times daily.     ferrous sulfate 325 (65 FE) MG tablet Take  325 mg by mouth daily with breakfast.     rosuvastatin (CRESTOR) 40 MG tablet Take 1 tablet (40 mg total) by mouth every morning. 90 tablet 3   No current facility-administered medications on file prior to visit.        ROS:  All others reviewed and negative.  Objective        PE:  BP 130/64 (BP Location: Right Arm, Patient Position: Sitting, Cuff Size: Large)   Pulse 81   Temp 98 F (36.7 C) (Oral)   Ht '5\' 10"'$  (1.778 m)   Wt 166 lb (75.3 kg)   SpO2 95%   BMI 23.82 kg/m                 Constitutional: Pt appears in NAD               HENT: Head: NCAT.                Right Ear: External ear normal.                 Left Ear: External ear normal.                Eyes: .  Pupils are equal, round, and reactive to light. Conjunctivae and EOM are normal               Nose: without d/c or deformity               Neck: Neck supple. Gross normal ROM               Cardiovascular: Normal rate and regular rhythm.                 Pulmonary/Chest: Effort normal and breath sounds without rales or wheezing.                Abd:  Soft, NT, ND, + BS, no organomegaly               Neurological: Pt is alert. At baseline orientation, motor grossly intact               Skin: Skin is warm. No rashes, no other new lesions, LE edema - none               Psychiatric: Pt behavior is normal without agitation   Micro: none  Cardiac tracings I have personally interpreted today:  none  Pertinent Radiological findings (summarize): none   Lab Results  Component Value Date   WBC 4.1 03/08/2022   HGB 12.9 (L) 03/08/2022   HCT 39.8 03/08/2022   PLT 169.0 03/08/2022   GLUCOSE 99 03/08/2022   CHOL 75 01/24/2022   TRIG 61.0 01/24/2022   HDL 34.00 (L) 01/24/2022   LDLDIRECT 74.0 04/21/2019   LDLCALC 29 01/24/2022   ALT 25 03/08/2022   AST 27 03/08/2022   NA 143 03/08/2022   K 3.7 03/08/2022   CL 107 03/08/2022   CREATININE 1.26 03/08/2022   BUN 12 03/08/2022   CO2 29 03/08/2022   TSH 0.80 01/24/2022    PSA 0.00 (L) 01/24/2022   INR 1.3 (H) 12/10/2021   HGBA1C 5.6 01/24/2022   Assessment/Plan:  IDRISS QUACKENBUSH is a 72 y.o. Black or African American [2] male with  has a past medical history of Anemia, iron deficiency (04/02/2016), Aortic atherosclerosis ([place]), Arthritis, BPH (benign prostatic hyperplasia) (04/10/2017), Cancer ([place]), COLONIC POLYPS, HX OF (11/24/2007), COPD (chronic obstructive pulmonary disease) ([place]), Coronary artery calcification seen on CAT scan, DIVERTICULOSIS, COLON (11/24/2007), ED (erectile dysfunction), GERD (gastroesophageal reflux disease), HLD (hyperlipidemia) (09/06/2020), HYPERTENSION (11/24/2007), Increased prostate specific antigen (PSA) velocity (06/22/2011), PAH (pulmonary artery hypertension) ([place]), and Peripheral vascular disease ([place]).  Encounter for well adult exam with abnormal findings Age and sex appropriate education and counseling updated with regular exercise and diet Referrals for preventative services - none needed Immunizations addressed - declines covid booster, for shingrx at the pharmacy, for flu shot today Smoking counseling  - counseled to quit completely Evidence for depression or other mood disorder - none significant Most recent labs reviewed. I have personally reviewed and have noted: 1) the patient's medical and social history 2) The patient's current medications and supplements 3) The patient's height, weight, and BMI have been recorded in the chart   Essential hypertension BP Readings from Last 3 Encounters:  09/09/22 130/64  09/06/22 134/69  08/12/22 (!) 144/66   Stable, pt to continue medical treatment lotrel 10-20 mg qd   Iron deficiency anemia No recent overt bleeding, now for iron with labs, consider renew iron rx  HLD (hyperlipidemia) Lab Results  Component Value Date   LDLCALC 29 01/24/2022   Stable, pt to continue current statin  crestor 20 mg qd, for f/u labs   Hyperglycemia Lab Results  Component Value Date    HGBA1C 5.6 01/24/2022   Stable, pt to continue current medical treatment  - diet, wt control   Hypertension BP Readings from Last 3 Encounters:  09/09/22 130/64  09/06/22 134/69  08/12/22 (!) 144/66   Stable, pt to continue medical treatment lotrel 10-20 mg qd   Smoker Pt counsled to quit, pt not quite ready  Vitamin D deficiency Last vitamin D Lab Results  Component Value Date   VD25OH 13.21 (L) 01/24/2022   Low, to start oral replacement  Followup: Return in about 6 months (around 03/10/2023).  Cathlean Cower, MD 09/09/2022 1:21 PM [facility]

## 2022-09-09 NOTE — Addendum Note (Signed)
Addended by: Max Sane on: 09/09/2022 01:30 PM   Modules accepted: Orders

## 2022-09-09 NOTE — Patient Instructions (Addendum)
Please have your Shingrix (shingles) shots done at your local pharmacy.  Please quit smoking completely  Please continue all other medications as before, and refills have been done if requested.  Please have the pharmacy call with any other refills you may need.  Please continue your efforts at being more active, low cholesterol diet, and weight control.  You are otherwise up to date with prevention measures today.  Please keep your appointments with your specialists as you may have planned  Please go to the LAB at the blood drawing area for the tests to be done  You will be contacted by phone if any changes need to be made immediately.  Otherwise, you will receive a letter about your results with an explanation, but please check with MyChart first.  Please remember to sign up for MyChart if you have not done so, as this will be important to you in the future with finding out test results, communicating by private email, and scheduling acute appointments online when needed.  Please make an Appointment to return in 6 months, or sooner if needed

## 2022-09-10 ENCOUNTER — Other Ambulatory Visit: Payer: Self-pay | Admitting: Internal Medicine

## 2022-09-10 MED ORDER — AMLODIPINE BESYLATE 10 MG PO TABS: 10.0000 mg | ORAL_TABLET | Freq: Every day | ORAL | 3 refills | Status: DC

## 2022-09-24 ENCOUNTER — Ambulatory Visit: Payer: PPO | Admitting: Internal Medicine

## 2022-09-24 DIAGNOSIS — C61 Malignant neoplasm of prostate: Secondary | ICD-10-CM | POA: Diagnosis not present

## 2022-09-30 ENCOUNTER — Ambulatory Visit (INDEPENDENT_AMBULATORY_CARE_PROVIDER_SITE_OTHER): Payer: PPO | Admitting: Internal Medicine

## 2022-09-30 VITALS — BP 132/66 | HR 80 | Temp 98.1°F | Ht 70.0 in | Wt 167.0 lb

## 2022-09-30 DIAGNOSIS — E7800 Pure hypercholesterolemia, unspecified: Secondary | ICD-10-CM

## 2022-09-30 DIAGNOSIS — R739 Hyperglycemia, unspecified: Secondary | ICD-10-CM

## 2022-09-30 DIAGNOSIS — N179 Acute kidney failure, unspecified: Secondary | ICD-10-CM | POA: Diagnosis not present

## 2022-09-30 DIAGNOSIS — I10 Essential (primary) hypertension: Secondary | ICD-10-CM

## 2022-09-30 DIAGNOSIS — N189 Chronic kidney disease, unspecified: Secondary | ICD-10-CM | POA: Insufficient documentation

## 2022-09-30 DIAGNOSIS — E559 Vitamin D deficiency, unspecified: Secondary | ICD-10-CM | POA: Diagnosis not present

## 2022-09-30 DIAGNOSIS — N1832 Chronic kidney disease, stage 3b: Secondary | ICD-10-CM

## 2022-09-30 LAB — BASIC METABOLIC PANEL
BUN: 13 mg/dL (ref 6–23)
CO2: 26 mEq/L (ref 19–32)
Calcium: 8.8 mg/dL (ref 8.4–10.5)
Chloride: 109 mEq/L (ref 96–112)
Creatinine, Ser: 1.45 mg/dL (ref 0.40–1.50)
GFR: 49.16 mL/min — ABNORMAL LOW (ref >60.00)
Glucose, Bld: 75 mg/dL (ref 70–99)
Potassium: 4.1 mEq/L (ref 3.5–5.1)
Sodium: 143 mEq/L (ref 135–145)

## 2022-09-30 NOTE — Progress Notes (Unsigned)
Patient ID: Joshua Curtis, male   DOB: 08/12/1951, 72 y.o.   MRN: [id number]        Chief Complaint: follow up HTN, mild AKI on CKD3a, low vit d, hld, hyperglycemia       HPI:  Joshua Curtis is a 72 y.o. male here overall doing well, was seen recently with mild worsening Cr over baseline at 1.83 with baseline about 1.3.  Stopped the ARB as well.  Bp has been maintained so far after stopping.  Pt denies chest pain, increased sob or doe, wheezing, orthopnea, PND, increased LE swelling, palpitations, dizziness or syncope.   Pt denies polydipsia, polyuria, or new focal neuro s/s.    Pt denies fever, wt loss, night sweats, loss of appetite, or other constitutional symptoms        Wt Readings from Last 3 Encounters:  09/30/22 167 lb (75.8 kg)  09/09/22 166 lb (75.3 kg)  09/03/22 160 lb (72.6 kg)   BP Readings from Last 3 Encounters:  09/30/22 132/66  09/09/22 130/64  09/06/22 134/69         Past Medical History:  Diagnosis Date   Anemia, iron deficiency 04/02/2016   Aortic atherosclerosis (HCC)    Arthritis    rt knee   BPH (benign prostatic hyperplasia) 04/10/2017   Cancer ([place])    prostate   COLONIC POLYPS, HX OF 11/24/2007   COPD (chronic obstructive pulmonary disease) ([place])    pt denies this- no respiratory issues of any kind per pt   Coronary artery calcification seen on CAT scan    DIVERTICULOSIS, COLON 11/24/2007   ED (erectile dysfunction)    GERD (gastroesophageal reflux disease)    HLD (hyperlipidemia) 09/06/2020   HYPERTENSION 11/24/2007   Increased prostate specific antigen (PSA) velocity 06/22/2011   PAH (pulmonary artery hypertension) ([place])    Peripheral vascular disease ([place])    Past Surgical History:  Procedure Laterality Date   ABDOMINAL AORTOGRAM W/LOWER EXTREMITY N/A 07/04/2020   Procedure: ABDOMINAL AORTOGRAM W/LOWER EXTREMITY;  Surgeon: Serafina Mitchell, MD;  Location: [place] [facility];  Service: Cardiovascular;  Laterality: N/A;   ABDOMINAL AORTOGRAM W/LOWER  EXTREMITY N/A 07/18/2020   Procedure: ABDOMINAL AORTOGRAM W/LOWER EXTREMITY;  Surgeon: Serafina Mitchell, MD;  Location: [place] [facility];  Service: Cardiovascular;  Laterality: N/A;   ABDOMINAL AORTOGRAM W/LOWER EXTREMITY N/A 04/04/2021   Procedure: ABDOMINAL AORTOGRAM W/LOWER EXTREMITY;  Surgeon: Cherre Robins, MD;  Location: [place] [facility];  Service: Cardiovascular;  Laterality: N/A;   BYPASS GRAFT FEMORAL-PERONEAL Right 04/13/2021   Procedure: RIGHT LEG FEMORAL-PERONEAL BYPASS USING PROPATEN GRAFT;  Surgeon: Serafina Mitchell, MD;  [facility];  Service: Vascular;  Laterality: Right;   COLONOSCOPY     ESOPHAGOGASTRODUODENOSCOPY (EGD) WITH PROPOFOL N/A 12/09/2021   Procedure: ESOPHAGOGASTRODUODENOSCOPY (EGD) WITH PROPOFOL;  Surgeon: Carol Ada, MD;  [facility];  Service: Gastroenterology;  Laterality: N/A;   HOT HEMOSTASIS N/A 12/09/2021   Procedure: HOT HEMOSTASIS (ARGON PLASMA COAGULATION/BICAP);  Surgeon: Carol Ada, MD;  [facility];  Service: Gastroenterology;  Laterality: N/A;   NO PAST SURGERIES     PATCH ANGIOPLASTY Right 04/13/2021   Procedure: ILIEO-FEMORAL USING VEIN PATCH ANGIOPLASTY;  Surgeon: Serafina Mitchell, MD;  [facility];  Service: Vascular;  Laterality: Right;   PERIPHERAL VASCULAR BALLOON ANGIOPLASTY Right 07/18/2020   Procedure: PERIPHERAL VASCULAR BALLOON ANGIOPLASTY;  Surgeon: Serafina Mitchell, MD;  Location: [place] [facility];  Service: Cardiovascular;  Laterality: Right;  tibioperoneal trunk  and posterior tibial   PERIPHERAL VASCULAR INTERVENTION  07/04/2020   Procedure: PERIPHERAL VASCULAR INTERVENTION;  Surgeon: Serafina Mitchell, MD;  Location: [place] [facility];  Service: Cardiovascular;;  Lt. SFA and Popliteal   PERIPHERAL VASCULAR INTERVENTION Right 07/18/2020   Procedure: PERIPHERAL VASCULAR INTERVENTION;  Surgeon: Serafina Mitchell, MD;  Location: [facility];  Service: Cardiovascular;  Laterality: Right;  Femoral  Popliteal   ROBOT ASSISTED LAPAROSCOPIC RADICAL PROSTATECTOMY N/A 01/08/2021   Procedure: XI ROBOTIC ASSISTED LAPAROSCOPIC RADICAL PROSTATECTOMY LEVEL 3;  Surgeon: Raynelle Bring, MD;  [facility];  Service: Urology;  Laterality: N/A;   SCLEROTHERAPY  12/09/2021   Procedure: SCLEROTHERAPY;  Surgeon: Carol Ada, MD;  [facility];  Service: Gastroenterology;;    reports that he quit smoking about 17 months ago. His smoking use included cigarettes. He smoked an average of .25 packs per day. He has never used smokeless tobacco. He reports that he does not currently use alcohol. He reports that he does not use drugs. family history includes Anuerysm in his brother; Diabetes in his father and paternal grandfather; Hypertension in his sister; Kidney failure in his brother; Stroke in his mother. No Known Allergies Current Outpatient Medications on File Prior to Visit  Medication Sig Dispense Refill   amLODipine (NORVASC) 10 MG tablet Take 1 tablet (10 mg total) by mouth daily. 90 tablet 3   ferrous sulfate 325 (65 FE) MG tablet Take 325 mg by mouth daily with breakfast.     rosuvastatin (CRESTOR) 40 MG tablet Take 1 tablet (40 mg total) by mouth every morning. 90 tablet 3   cephALEXin (KEFLEX) 500 MG capsule Take 500 mg by mouth 3 (three) times daily. (Patient not taking: Reported on 09/30/2022)     No current facility-administered medications on file prior to visit.        ROS:  All others reviewed and negative.  Objective        PE:  BP 132/66 (BP Location: Right Arm, Patient Position: Sitting, Cuff Size: Normal)   Pulse 80   Temp 98.1 F (36.7 C) (Oral)   Ht '5\' 10"'$  (1.778 m)   Wt 167 lb (75.8 kg)   SpO2 94%   BMI 23.96 kg/m                 Constitutional: Pt appears in NAD               HENT: Head: NCAT.                Right Ear: External ear normal.                 Left Ear: External ear normal.                Eyes: . Pupils are equal, round, and reactive to light.  Conjunctivae and EOM are normal               Nose: without d/c or deformity               Neck: Neck supple. Gross normal ROM               Cardiovascular: Normal rate and regular rhythm.                 Pulmonary/Chest: Effort normal and breath sounds without rales or wheezing.                Abd:  Soft, NT, ND, +  BS, no organomegaly               Neurological: Pt is alert. At baseline orientation, motor grossly intact               Skin: Skin is warm. No rashes, no other new lesions, LE edema - none               Psychiatric: Pt behavior is normal without agitation   Micro: none  Cardiac tracings I have personally interpreted today:  none  Pertinent Radiological findings (summarize): none   Lab Results  Component Value Date   WBC 7.0 09/09/2022   HGB 12.2 (L) 09/09/2022   HCT 36.0 (L) 09/09/2022   PLT 221.0 09/09/2022   GLUCOSE 75 09/30/2022   CHOL 75 09/09/2022   TRIG 137.0 09/09/2022   HDL 30.70 (L) 09/09/2022   LDLDIRECT 74.0 04/21/2019   LDLCALC 17 09/09/2022   ALT 24 09/09/2022   AST 27 09/09/2022   NA 143 09/30/2022   K 4.1 09/30/2022   CL 109 09/30/2022   CREATININE 1.45 09/30/2022   BUN 13 09/30/2022   CO2 26 09/30/2022   TSH 1.40 09/09/2022   PSA 0.00 (L) 09/09/2022   INR 1.3 (H) 12/10/2021   HGBA1C 5.9 09/09/2022   MICROALBUR 1.2 09/09/2022   Assessment/Plan:  Joshua Curtis is a 72 y.o. Black or African American [2] male with  has a past medical history of Anemia, iron deficiency (04/02/2016), Aortic atherosclerosis ([place]), Arthritis, BPH (benign prostatic hyperplasia) (04/10/2017), Cancer ([place]), COLONIC POLYPS, HX OF (11/24/2007), COPD (chronic obstructive pulmonary disease) ([place]), Coronary artery calcification seen on CAT scan, DIVERTICULOSIS, COLON (11/24/2007), ED (erectile dysfunction), GERD (gastroesophageal reflux disease), HLD (hyperlipidemia) (09/06/2020), HYPERTENSION (11/24/2007), Increased prostate specific antigen (PSA) velocity (06/22/2011), PAH  (pulmonary artery hypertension) ([place]), and Peripheral vascular disease ([place]).  Acute renal failure superimposed on chronic kidney disease ([place]) With recent mild worsening, now for f/u lab after oral hydration and d/c ARB, consider renal referral  HLD (hyperlipidemia) Lab Results  Component Value Date   LDLCALC 17 09/09/2022   Stable, pt to continue current statin crestor 40 mg qd   Hyperglycemia Lab Results  Component Value Date   HGBA1C 5.9 09/09/2022   Stable, pt to continue current medical treatment  - diet, wt control   Hypertension BP Readings from Last 3 Encounters:  09/30/22 132/66  09/09/22 130/64  09/06/22 134/69   Stable after ARB d/c, , pt to continue medical treatment norvasc 10 mg qd   Vitamin D deficiency Last vitamin D Lab Results  Component Value Date   VD25OH 7.84 (L) 09/09/2022   Low, to start oral replacement  Followup: Return in about 6 months (around 03/31/2023).  Cathlean Cower, MD 10/03/2022 12:46 PM [facility]

## 2022-09-30 NOTE — Patient Instructions (Signed)

## 2022-10-03 ENCOUNTER — Encounter: Payer: Self-pay | Admitting: Internal Medicine

## 2022-10-03 NOTE — Assessment & Plan Note (Signed)
Last vitamin D Lab Results  Component Value Date   VD25OH 7.84 (L) 09/09/2022   Low, to start oral replacement

## 2022-10-03 NOTE — Assessment & Plan Note (Signed)
Lab Results  Component Value Date   LDLCALC 17 09/09/2022   Stable, pt to continue current statin crestor 40 mg qd

## 2022-10-03 NOTE — Assessment & Plan Note (Signed)
With recent mild worsening, now for f/u lab after oral hydration and d/c ARB, consider renal referral

## 2022-10-03 NOTE — Assessment & Plan Note (Addendum)
BP Readings from Last 3 Encounters:  09/30/22 132/66  09/09/22 130/64  09/06/22 134/69   Stable after ARB d/c, , pt to continue medical treatment norvasc 10 mg qd

## 2022-10-03 NOTE — Assessment & Plan Note (Signed)
Lab Results  Component Value Date   HGBA1C 5.9 09/09/2022   Stable, pt to continue current medical treatment  - diet, wt control

## 2022-11-25 ENCOUNTER — Other Ambulatory Visit: Payer: Self-pay | Admitting: Physician Assistant

## 2022-11-28 ENCOUNTER — Telehealth: Payer: Self-pay | Admitting: Internal Medicine

## 2022-11-28 NOTE — Telephone Encounter (Signed)
Pt is confuse about his medication amLODipine (NORVASC) 10 MG tablet and he also stating that the pharmacy trying to prescribe something different can a nurse please reach out to pt.

## 2022-12-03 MED ORDER — ROSUVASTATIN CALCIUM 40 MG PO TABS: 40.0000 mg | ORAL_TABLET | Freq: Every morning | ORAL | 3 refills | Status: DC

## 2022-12-03 MED ORDER — FERROUS SULFATE 325 (65 FE) MG PO TABS: 325.0000 mg | ORAL_TABLET | Freq: Every day | ORAL | 1 refills | Status: DC

## 2022-12-03 NOTE — Telephone Encounter (Signed)
Called pt back asked if he received call back concerning his Amlodipine. He states no when he was in her MD change the amlodipine to 10 mg. Look back at last visit inform pt that was corrected. wanted to make sure the Amlodipine/Lotrel was d/c. Pt states he also need the rosuvastatin and [facility]. Inform will send to pharmacy.Marland Kitchen/l,mb

## 2022-12-06 ENCOUNTER — Ambulatory Visit (INDEPENDENT_AMBULATORY_CARE_PROVIDER_SITE_OTHER): Payer: PPO | Admitting: Podiatry

## 2022-12-06 DIAGNOSIS — M79675 Pain in left toe(s): Secondary | ICD-10-CM | POA: Diagnosis not present

## 2022-12-06 DIAGNOSIS — B351 Tinea unguium: Secondary | ICD-10-CM | POA: Diagnosis not present

## 2022-12-06 DIAGNOSIS — M79674 Pain in right toe(s): Secondary | ICD-10-CM

## 2022-12-06 DIAGNOSIS — I739 Peripheral vascular disease, unspecified: Secondary | ICD-10-CM

## 2022-12-07 NOTE — Progress Notes (Signed)
Subjective: Chief Complaint  Patient presents with   Nail Problem    Thick painful toenails, 3 month follow up    72 y.o. returns the office today for painful, elongated, thickened toenails which he cannot trim himself.  Denies any open sores.  No swelling redness or drainage.  No other concerns today.     PCP: Corwin Levins, MD Last Seen: September 30, 2022  A1c: 5.9 on September 09, 2022  Objective: AAO 3, NAD DP/PT pulses 1/4, CRT less than 3 seconds Nails hypertrophic, dystrophic, elongated, brittle, discolored 2. There is tenderness to the hallux toenails bilaterally.  The other nails are recently been trimmed.  There is no surrounding erythema or drainage along the nail sites.  Mild incurvation of the right hallux toenail worse than left.  There is no edema, erythema or signs of infection. No pain with calf compression, swelling, warmth, erythema.  Assessment: Patient presents with symptomatic onychomycosis, ingrown toenail, PAD  Plan: -Treatment options including alternatives, risks, complications were discussed -Nails sharply debrided 2 without complication/bleeding.  As a courtesy I also smoothed the other toenails.   -Discussed daily foot inspection. If there are any changes, to call the office immediately.  -Follow-up in 3 months or sooner if any problems are to arise. In the meantime, encouraged to call the office with any questions, concerns, changes symptoms.  Ovid Curd, DPM

## 2023-02-27 ENCOUNTER — Other Ambulatory Visit: Payer: Self-pay | Admitting: Internal Medicine

## 2023-03-01 ENCOUNTER — Other Ambulatory Visit: Payer: Self-pay | Admitting: Internal Medicine

## 2023-03-10 ENCOUNTER — Ambulatory Visit (INDEPENDENT_AMBULATORY_CARE_PROVIDER_SITE_OTHER): Payer: PPO | Admitting: Internal Medicine

## 2023-03-10 ENCOUNTER — Encounter: Payer: Self-pay | Admitting: Internal Medicine

## 2023-03-10 VITALS — BP 128/72 | HR 85 | Temp 97.9°F | Ht 70.0 in | Wt 158.0 lb

## 2023-03-10 DIAGNOSIS — E559 Vitamin D deficiency, unspecified: Secondary | ICD-10-CM | POA: Diagnosis not present

## 2023-03-10 DIAGNOSIS — F17200 Nicotine dependence, unspecified, uncomplicated: Secondary | ICD-10-CM

## 2023-03-10 DIAGNOSIS — D509 Iron deficiency anemia, unspecified: Secondary | ICD-10-CM | POA: Diagnosis not present

## 2023-03-10 DIAGNOSIS — I739 Peripheral vascular disease, unspecified: Secondary | ICD-10-CM | POA: Diagnosis not present

## 2023-03-10 DIAGNOSIS — I10 Essential (primary) hypertension: Secondary | ICD-10-CM

## 2023-03-10 DIAGNOSIS — E7800 Pure hypercholesterolemia, unspecified: Secondary | ICD-10-CM | POA: Diagnosis not present

## 2023-03-10 DIAGNOSIS — N183 Chronic kidney disease, stage 3 unspecified: Secondary | ICD-10-CM | POA: Insufficient documentation

## 2023-03-10 DIAGNOSIS — N1831 Chronic kidney disease, stage 3a: Secondary | ICD-10-CM

## 2023-03-10 DIAGNOSIS — N2889 Other specified disorders of kidney and ureter: Secondary | ICD-10-CM

## 2023-03-10 DIAGNOSIS — R739 Hyperglycemia, unspecified: Secondary | ICD-10-CM | POA: Diagnosis not present

## 2023-03-10 LAB — BASIC METABOLIC PANEL
BUN: 26 mg/dL — ABNORMAL HIGH (ref 6–23)
CO2: 26 mEq/L (ref 19–32)
Calcium: 9.1 mg/dL (ref 8.4–10.5)
Chloride: 109 mEq/L (ref 96–112)
Creatinine, Ser: 1.64 mg/dL — ABNORMAL HIGH (ref 0.40–1.50)
GFR: 42.28 mL/min — ABNORMAL LOW (ref >60.00)
Glucose, Bld: 91 mg/dL (ref 70–99)
Potassium: 5.2 mEq/L — ABNORMAL HIGH (ref 3.5–5.1)
Sodium: 141 mEq/L (ref 135–145)

## 2023-03-10 LAB — URINALYSIS, ROUTINE W REFLEX MICROSCOPIC
Bilirubin Urine: NEGATIVE
Hgb urine dipstick: NEGATIVE
Ketones, ur: NEGATIVE
Nitrite: NEGATIVE
RBC / HPF: NONE SEEN (ref 0–?)
Specific Gravity, Urine: 1.015 (ref 1.000–1.030)
Total Protein, Urine: NEGATIVE
Urine Glucose: NEGATIVE
Urobilinogen, UA: 0.2 (ref 0.0–1.0)
pH: 6 (ref 5.0–8.0)

## 2023-03-10 LAB — CBC WITH DIFFERENTIAL/PLATELET
Basophils Absolute: 0.1 10*3/uL (ref 0.0–0.1)
Basophils Relative: 1.3 % (ref 0.0–3.0)
Eosinophils Absolute: 0.5 10*3/uL (ref 0.0–0.7)
Eosinophils Relative: 7.2 % — ABNORMAL HIGH (ref 0.0–5.0)
HCT: 41.5 % (ref 39.0–52.0)
Hemoglobin: 13.6 g/dL (ref 13.0–17.0)
Lymphocytes Relative: 25.9 % (ref 12.0–46.0)
Lymphs Abs: 1.7 10*3/uL (ref 0.7–4.0)
MCHC: 32.7 g/dL (ref 30.0–36.0)
MCV: 93.4 fl (ref 78.0–100.0)
Monocytes Absolute: 0.6 10*3/uL (ref 0.1–1.0)
Monocytes Relative: 8.7 % (ref 3.0–12.0)
Neutro Abs: 3.7 10*3/uL (ref 1.4–7.7)
Neutrophils Relative %: 56.9 % (ref 43.0–77.0)
Platelets: 223 10*3/uL (ref 150.0–400.0)
RBC: 4.44 Mil/uL (ref 4.22–5.81)
RDW: 16.7 % — ABNORMAL HIGH (ref 11.5–15.5)
WBC: 6.5 10*3/uL (ref 4.0–10.5)

## 2023-03-10 LAB — IBC PANEL
Iron: 98 ug/dL (ref 42–165)
Saturation Ratios: 28.7 % (ref 20.0–50.0)
TIBC: 341.6 ug/dL (ref 250.0–450.0)
Transferrin: 244 mg/dL (ref 212.0–360.0)

## 2023-03-10 LAB — LIPID PANEL
Cholesterol: 85 mg/dL (ref 0–200)
HDL: 42.9 mg/dL (ref >39.00)
LDL Cholesterol: 28 mg/dL (ref 0–99)
NonHDL: 42.18
Total CHOL/HDL Ratio: 2
Triglycerides: 73 mg/dL (ref 0.0–149.0)
VLDL: 14.6 mg/dL (ref 0.0–40.0)

## 2023-03-10 LAB — FERRITIN: Ferritin: 71 ng/mL (ref 22.0–322.0)

## 2023-03-10 LAB — HEPATIC FUNCTION PANEL
ALT: 50 U/L (ref 0–53)
AST: 47 U/L — ABNORMAL HIGH (ref 0–37)
Albumin: 4.5 g/dL (ref 3.5–5.2)
Alkaline Phosphatase: 47 U/L (ref 39–117)
Bilirubin, Direct: 0.2 mg/dL (ref 0.0–0.3)
Total Bilirubin: 0.6 mg/dL (ref 0.2–1.2)
Total Protein: 7.5 g/dL (ref 6.0–8.3)

## 2023-03-10 LAB — HEMOGLOBIN A1C: Hgb A1c MFr Bld: 6.3 % (ref 4.6–6.5)

## 2023-03-10 LAB — VITAMIN D 25 HYDROXY (VIT D DEFICIENCY, FRACTURES): VITD: 18.29 ng/mL — ABNORMAL LOW (ref 30.00–100.00)

## 2023-03-10 MED ORDER — ROSUVASTATIN CALCIUM 40 MG PO TABS: 40.0000 mg | ORAL_TABLET | Freq: Every morning | ORAL | 3 refills | Status: DC

## 2023-03-10 MED ORDER — AMLODIPINE BESYLATE 10 MG PO TABS: 10.0000 mg | ORAL_TABLET | Freq: Every day | ORAL | 3 refills | Status: DC

## 2023-03-10 NOTE — Patient Instructions (Addendum)
Please have your Shingrix (shingles) shots done at your local pharmacy.  Ok to stop the iron tablet  Please stop smoking  Please continue all other medications as before, and refills have been done if requested -the crestor  Please have the pharmacy call with any other refills you may need.  Please continue your efforts at being more active, low cholesterol diet, and weight control.  Please keep your appointments with your specialists as you may have planned - pleaes remember to follow up with [facility] in Dec 2024  You will be contacted regarding the referral for: MRI for the left kidney  Please go to the LAB at the blood drawing area for the tests to be done  You will be contacted by phone if any changes need to be made immediately.  Otherwise, you will receive a letter about your results with an explanation, but please check with MyChart first.  Please remember to sign up for MyChart if you have not done so, as this will be important to you in the future with finding out test results, communicating by private email, and scheduling acute appointments online when needed. Rip Harbour to cancel the July 29 appt.

## 2023-03-10 NOTE — Assessment & Plan Note (Signed)
Left renal mass due for f/u - for mri renal

## 2023-03-10 NOTE — Progress Notes (Unsigned)
Patient ID: Joshua Curtis, male   DOB: [birth year], 72 y.o.   MRN: [id number]        Chief Complaint: follow up HTN, HLD, ckd3a, tobacco abuse, PVD, left kidney mass       HPI:  Joshua Curtis is a 72 y.o. male here overall doing ok,  Pt denies chest pain, increased sob or doe, wheezing, orthopnea, PND, increased LE swelling, palpitations, dizziness or syncope.   Pt denies polydipsia, polyuria, or new focal neuro s/s.    Pt denies fever, wt loss, night sweats, loss of appetite, or other constitutional symptoms  Denies urinary symptoms such as dysuria, frequency, urgency, flank pain, hematuria or n/v, fever, chills.  Still smoking, not ready to quit.  Tolerating crestor.  Has vascular surg f/u appt dec 2024.  Did have mild complex left renal mass by CT approx 1 yr ago, due for f/u.  Asking for f/u iron lab       Wt Readings from Last 3 Encounters:  03/10/23 158 lb (71.7 kg)  09/30/22 167 lb (75.8 kg)  09/09/22 166 lb (75.3 kg)   BP Readings from Last 3 Encounters:  03/10/23 128/72  09/30/22 132/66  09/09/22 130/64         Past Medical History:  Diagnosis Date   Anemia, iron deficiency 04/02/2016   Aortic atherosclerosis (HCC)    Arthritis    rt knee   BPH (benign prostatic hyperplasia) 04/10/2017   Cancer (HCC)    prostate   COLONIC POLYPS, HX OF 11/24/2007   COPD (chronic obstructive pulmonary disease) (HCC)    pt denies this- no respiratory issues of any kind per pt   Coronary artery calcification seen on CAT scan    DIVERTICULOSIS, COLON 11/24/2007   ED (erectile dysfunction)    GERD (gastroesophageal reflux disease)    HLD (hyperlipidemia) 09/06/2020   HYPERTENSION 11/24/2007   Increased prostate specific antigen (PSA) velocity 06/22/2011   PAH (pulmonary artery hypertension) (HCC)    Peripheral vascular disease (HCC)    Past Surgical History:  Procedure Laterality Date   ABDOMINAL AORTOGRAM W/LOWER EXTREMITY N/A 07/04/2020   Procedure: ABDOMINAL AORTOGRAM W/LOWER EXTREMITY;   Surgeon: Nada Libman, MD;  Location: [facility];  Service: Cardiovascular;  Laterality: N/A;   ABDOMINAL AORTOGRAM W/LOWER EXTREMITY N/A 07/18/2020   Procedure: ABDOMINAL AORTOGRAM W/LOWER EXTREMITY;  Surgeon: Nada Libman, MD;  Location: [facility];  Service: Cardiovascular;  Laterality: N/A;   ABDOMINAL AORTOGRAM W/LOWER EXTREMITY N/A 04/04/2021   Procedure: ABDOMINAL AORTOGRAM W/LOWER EXTREMITY;  Surgeon: Leonie Douglas, MD;  Location: [facility];  Service: Cardiovascular;  Laterality: N/A;   BYPASS GRAFT FEMORAL-PERONEAL Right 04/13/2021   Procedure: RIGHT LEG FEMORAL-PERONEAL BYPASS USING PROPATEN GRAFT;  Surgeon: Nada Libman, MD;  Location: [facility];  Service: Vascular;  Laterality: Right;   COLONOSCOPY     ESOPHAGOGASTRODUODENOSCOPY (EGD) WITH PROPOFOL N/A 12/09/2021   Procedure: ESOPHAGOGASTRODUODENOSCOPY (EGD) WITH PROPOFOL;  Surgeon: Jeani Hawking, MD;  Location: [facility];  Service: Gastroenterology;  Laterality: N/A;   HOT HEMOSTASIS N/A 12/09/2021   Procedure: HOT HEMOSTASIS (ARGON PLASMA COAGULATION/BICAP);  Surgeon: Jeani Hawking, MD;  Location: [facility];  Service: Gastroenterology;  Laterality: N/A;   NO PAST SURGERIES     PATCH ANGIOPLASTY Right 04/13/2021   Procedure: ILIEO-FEMORAL USING VEIN PATCH ANGIOPLASTY;  Surgeon: Nada Libman, MD;  Location: [facility];  Service: Vascular;  Laterality: Right;   PERIPHERAL VASCULAR BALLOON ANGIOPLASTY Right 07/18/2020   Procedure:  PERIPHERAL VASCULAR BALLOON ANGIOPLASTY;  Surgeon: Nada Libman, MD;  [facility]: [facility];  Service: Cardiovascular;  Laterality: Right;  tibioperoneal trunk and posterior tibial   PERIPHERAL VASCULAR INTERVENTION  07/04/2020   Procedure: PERIPHERAL VASCULAR INTERVENTION;  Surgeon: Nada Libman, MD;  [facility];  Service: Cardiovascular;;  Lt. SFA and Popliteal   PERIPHERAL VASCULAR INTERVENTION Right 07/18/2020   Procedure: PERIPHERAL VASCULAR  INTERVENTION;  Surgeon: Nada Libman, MD;  [facility];  Service: Cardiovascular;  Laterality: Right;  Femoral Popliteal   ROBOT ASSISTED LAPAROSCOPIC RADICAL PROSTATECTOMY N/A 01/08/2021   Procedure: XI ROBOTIC ASSISTED LAPAROSCOPIC RADICAL PROSTATECTOMY LEVEL 3;  Surgeon: Heloise Purpura, MD;  Location: [facility];  Service: Urology;  Laterality: N/A;   SCLEROTHERAPY  12/09/2021   Procedure: SCLEROTHERAPY;  Surgeon: Jeani Hawking, MD;  Location: [facility];  Service: Gastroenterology;;    reports that he quit smoking about 22 months ago. His smoking use included cigarettes. He smoked an average of .25 packs per day. He has never used smokeless tobacco. He reports that he does not currently use alcohol. He reports that he does not use drugs. family history includes Anuerysm in his brother; Diabetes in his father and paternal grandfather; Hypertension in his sister; Kidney failure in his brother; Stroke in his mother. No Known Allergies Current Outpatient Medications on File Prior to Visit  Medication Sig Dispense Refill   amLODipine-benazepril (LOTREL) 10-20 MG capsule Take 1 capsule by mouth daily.     No current facility-administered medications on file prior to visit.        ROS:  All others reviewed and negative.  Objective        PE:  BP 128/72 (BP Location: Right Arm, Patient Position: Sitting, Cuff Size: Normal)   Pulse 85   Temp 97.9 F (36.6 C) (Oral)   Ht 5\' 10"  (1.778 m)   Wt 158 lb (71.7 kg)   SpO2 97%   BMI 22.67 kg/m                 Constitutional: Pt appears in NAD               HENT: Head: NCAT.                Right Ear: External ear normal.                 Left Ear: External ear normal.                Eyes: . Pupils are equal, round, and reactive to light. Conjunctivae and EOM are normal               Nose: without d/c or deformity               Neck: Neck supple. Gross normal ROM               Cardiovascular: Normal rate and regular rhythm.                  Pulmonary/Chest: Effort normal and breath sounds without rales or wheezing.                Abd:  Soft, NT, ND, + BS, no organomegaly               Neurological: Pt is alert. At baseline orientation, motor grossly intact               Skin:  Skin is warm. No rashes, no other new lesions, LE edema - none               Psychiatric: Pt behavior is normal without agitation   Micro: none  Cardiac tracings I have personally interpreted today:  none  Pertinent Radiological findings (summarize): none   Lab Results  Component Value Date   WBC 6.5 03/10/2023   HGB 13.6 03/10/2023   HCT 41.5 03/10/2023   PLT 223.0 03/10/2023   GLUCOSE 91 03/10/2023   CHOL 85 03/10/2023   TRIG 73.0 03/10/2023   HDL 42.90 03/10/2023   LDLDIRECT 74.0 04/21/2019   LDLCALC 28 03/10/2023   ALT 50 03/10/2023   AST 47 (H) 03/10/2023   NA 141 03/10/2023   K 5.2 No hemolysis seen (H) 03/10/2023   CL 109 03/10/2023   CREATININE 1.64 (H) 03/10/2023   BUN 26 (H) 03/10/2023   CO2 26 03/10/2023   TSH 1.40 09/09/2022   PSA 0.00 (L) 09/09/2022   INR 1.3 (H) 12/10/2021   HGBA1C 6.3 03/10/2023   MICROALBUR 1.2 09/09/2022   Assessment/Plan:  Joshua Curtis is a 72 y.o. Black or African American [2] male with  has a past medical history of Anemia, iron deficiency (04/02/2016), Aortic atherosclerosis (HCC), Arthritis, BPH (benign prostatic hyperplasia) (04/10/2017), Cancer (HCC), COLONIC POLYPS, HX OF (11/24/2007), COPD (chronic obstructive pulmonary disease) (HCC), Coronary artery calcification seen on CAT scan, DIVERTICULOSIS, COLON (11/24/2007), ED (erectile dysfunction), GERD (gastroesophageal reflux disease), HLD (hyperlipidemia) (09/06/2020), HYPERTENSION (11/24/2007), Increased prostate specific antigen (PSA) velocity (06/22/2011), PAH (pulmonary artery hypertension) (HCC), and Peripheral vascular disease (HCC).  Other specified disorders of kidney and ureter Left renal mass due for f/u - for mri renal  Essential  hypertension BP Readings from Last 3 Encounters:  03/10/23 128/72  09/30/22 132/66  09/09/22 130/64   Stable, pt to continue medical treatment lotrel 10-20 mg qd   Iron deficiency anemia No recent overt bleeding, for f/u lab  PVD (peripheral vascular disease) (HCC) Stable overall, pt to consider Card CT score, continue statin, not taking asa due to hx of GI bleeding  CKD (chronic kidney disease) stage 3, GFR 30-59 ml/min (HCC) Lab Results  Component Value Date   CREATININE 1.64 (H) 03/10/2023   Stable overall, cont to avoid nephrotoxins   HLD (hyperlipidemia) Lab Results  Component Value Date   LDLCALC 28 03/10/2023   Stable, pt to continue current statin crestor 40 qd   Hyperglycemia Lab Results  Component Value Date   HGBA1C 6.3 03/10/2023   Stable, pt to continue current medical treatment  - diet, wt control   Smoker Pt counsled to quit, pt not ready  Vitamin D deficiency Last vitamin D Lab Results  Component Value Date   VD25OH 18.29 (L) 03/10/2023   Low to start oral replacement  Followup: Return in about 6 months (around 09/10/2023).  Oliver Barre, MD 03/11/2023 8:45 PM [facility]

## 2023-03-11 ENCOUNTER — Encounter: Payer: Self-pay | Admitting: Internal Medicine

## 2023-03-11 ENCOUNTER — Ambulatory Visit: Payer: PPO | Admitting: Podiatry

## 2023-03-11 NOTE — Assessment & Plan Note (Signed)
Lab Results  Component Value Date   HGBA1C 6.3 03/10/2023   Stable, pt to continue current medical treatment  - diet, wt control

## 2023-03-11 NOTE — Assessment & Plan Note (Signed)
Lab Results  Component Value Date   CREATININE 1.64 (H) 03/10/2023   Stable overall, cont to avoid nephrotoxins

## 2023-03-11 NOTE — Assessment & Plan Note (Signed)
BP Readings from Last 3 Encounters:  03/10/23 128/72  09/30/22 132/66  09/09/22 130/64   Stable, pt to continue medical treatment lotrel 10-20 mg qd

## 2023-03-11 NOTE — Assessment & Plan Note (Signed)
Last vitamin D Lab Results  Component Value Date   VD25OH 18.29 (L) 03/10/2023   Low to start oral replacement

## 2023-03-11 NOTE — Assessment & Plan Note (Signed)
No recent overt bleeding, for f/u lab ?

## 2023-03-11 NOTE — Assessment & Plan Note (Signed)
Lab Results  Component Value Date   LDLCALC 28 03/10/2023   Stable, pt to continue current statin crestor 40 qd

## 2023-03-11 NOTE — Assessment & Plan Note (Signed)
Stable overall, pt to consider Card CT score, continue statin, not taking asa due to hx of GI bleeding

## 2023-03-11 NOTE — Assessment & Plan Note (Signed)
Pt counsled to quit, pt not ready °

## 2023-03-18 ENCOUNTER — Ambulatory Visit: Payer: PPO | Admitting: Podiatry

## 2023-03-18 DIAGNOSIS — M79675 Pain in left toe(s): Secondary | ICD-10-CM | POA: Diagnosis not present

## 2023-03-18 DIAGNOSIS — M79674 Pain in right toe(s): Secondary | ICD-10-CM

## 2023-03-18 DIAGNOSIS — B351 Tinea unguium: Secondary | ICD-10-CM

## 2023-03-18 DIAGNOSIS — I739 Peripheral vascular disease, unspecified: Secondary | ICD-10-CM | POA: Diagnosis not present

## 2023-03-23 NOTE — Progress Notes (Signed)
Subjective: Chief Complaint  Patient presents with   Nail Problem    Patient came in today for Diabetic foot care, A1c-6.3      72 y.o. returns the office today for painful, elongated, thickened toenails which he cannot trim himself.  Denies any ulcerations.  No swelling redness or drainage.  No other concerns today.     PCP: Corwin Levins, MD Last Seen: 03/10/2023  A1c: 6.3 on 7//2024  Objective: AAO 3, NAD DP/PT pulses 1/4, CRT less than 3 seconds Nails hypertrophic, dystrophic, elongated, brittle, discolored 2. There is tenderness to the hallux toenails bilaterally.  The other nails are recently been trimmed.  There is no surrounding erythema or drainage along the nail sites.  Mild incurvation of the right hallux toenail worse than left.  There is no edema, erythema or signs of infection. No pain with calf compression, swelling, warmth, erythema.  Assessment: Patient presents with symptomatic onychomycosis, ingrown toenail, PAD  Plan: -Treatment options including alternatives, risks, complications were discussed -Nails sharply debrided 2 without complication/bleeding.  As a courtesy I also smoothed the other toenails.   -No ulcerations at this time, continue to monitor.  -Discussed daily foot inspection. If there are any changes, to call the office immediately.  -Follow-up in 3 months or sooner if any problems are to arise. In the meantime, encouraged to call the office with any questions, concerns, changes symptoms.  Ovid Curd, DPM

## 2023-03-31 ENCOUNTER — Ambulatory Visit: Payer: PPO | Admitting: Internal Medicine

## 2023-04-14 DIAGNOSIS — C61 Malignant neoplasm of prostate: Secondary | ICD-10-CM | POA: Diagnosis not present

## 2023-04-25 DIAGNOSIS — C61 Malignant neoplasm of prostate: Secondary | ICD-10-CM | POA: Diagnosis not present

## 2023-04-30 ENCOUNTER — Ambulatory Visit
Admission: RE | Admit: 2023-04-30 | Discharge: 2023-04-30 | Disposition: A | Discharge status: Home / Self Care | Payer: PPO | Source: Ambulatory Visit | Attending: Internal Medicine | Admitting: Internal Medicine

## 2023-04-30 DIAGNOSIS — N2889 Other specified disorders of kidney and ureter: Secondary | ICD-10-CM | POA: Diagnosis not present

## 2023-04-30 MED ORDER — GADOPICLENOL 0.5 MMOL/ML IV SOLN
7.5000 mL | Freq: Once | INTRAVENOUS | Status: AC | PRN
  Administered 2023-04-30: 7.5 mL via INTRAVENOUS

## 2023-05-05 ENCOUNTER — Other Ambulatory Visit: Payer: Self-pay | Admitting: Internal Medicine

## 2023-05-05 DIAGNOSIS — N2889 Other specified disorders of kidney and ureter: Secondary | ICD-10-CM

## 2023-05-06 ENCOUNTER — Telehealth: Payer: Self-pay

## 2023-05-06 NOTE — Telephone Encounter (Signed)
Ok this has already been addressed - pt has been referred to urology

## 2023-05-30 ENCOUNTER — Other Ambulatory Visit: Payer: Self-pay | Admitting: Internal Medicine

## 2023-06-03 ENCOUNTER — Telehealth: Payer: Self-pay | Admitting: Internal Medicine

## 2023-06-03 NOTE — Telephone Encounter (Signed)
A year supply was sent in 03/10/23, Pt not due for refill.

## 2023-06-03 NOTE — Telephone Encounter (Signed)
Prescription Request  06/03/2023  LOV: 03/10/2023  What is the name of the medication or equipment? amLODipine (NORVASC) 10 MG tablet    Have you contacted your pharmacy to request a refill? No   Which pharmacy would you like this sent to?  [id number] [place], [place] - [address] AT [facility] [address] [place] [address] Phone: [phone] Fax: [phone]    Patient notified that their request is being sent to the clinical staff for review and that they should receive a response within 2 business days.   Please advise at Mobile [phone] (mobile)

## 2023-06-17 ENCOUNTER — Telehealth: Payer: Self-pay | Admitting: *Deleted

## 2023-06-17 ENCOUNTER — Ambulatory Visit: Payer: PPO | Admitting: Podiatry

## 2023-06-17 DIAGNOSIS — D49512 Neoplasm of unspecified behavior of left kidney: Secondary | ICD-10-CM | POA: Diagnosis not present

## 2023-06-17 DIAGNOSIS — R8271 Bacteriuria: Secondary | ICD-10-CM | POA: Diagnosis not present

## 2023-06-17 NOTE — Telephone Encounter (Signed)
Our office received referral and the need for pre op clearance as well. I called the office as that we will also need a formal clearance request to be faxed over for the appt 06/23/23. Gave fax # [phone] attn: pre op team

## 2023-06-18 NOTE — Telephone Encounter (Signed)
Pre-operative Risk Assessment    Patient Name: Joshua Curtis  DOB: Mar 25, 1951 MRN: [id number]  DATE OF LAST VISIT: 12/28/20 DR. CRENSHAW DATE OF NEXT VISIT: 06/23/23 Joni Reining, DNP    Request for Surgical Clearance    Procedure:   LEFT RENAL MASS -PER CLEARANCE FORM  Date of Surgery:  Clearance TBD                                 Surgeon:  DR. Heloise Purpura Surgeon's Group or Practice Name:  [facility] Phone number:  [phone] Fax number:  [phone]   Type of Clearance Requested:   - Medical ; NONE INDICATED TO BE HELD   Type of Anesthesia:  Not Indicated   Additional requests/questions:    Joshua Curtis   06/18/2023, 12:00 PM

## 2023-06-18 NOTE — Telephone Encounter (Signed)
Name: Joshua Curtis  DOB: 02/07/1951  MRN: [id number]  Primary Cardiologist: Olga Millers, MD  Chart reviewed as part of pre-operative protocol coverage. Because of [name]'s past medical history and time since last visit, he will require a follow-up in-office visit in order to better assess preoperative cardiovascular risk.  Pre-op covering staff: - Please schedule appointment and call patient to inform them. If patient already had an upcoming appointment within acceptable timeframe, please add "pre-op clearance" to the appointment notes so provider is aware. - Please contact requesting surgeon's office via preferred method (i.e, phone, fax) to inform them of need for appointment prior to surgery.  No medications indicated as needing held.  Sharlene Dory, PA-C  06/18/2023, 12:45 PM

## 2023-06-18 NOTE — Telephone Encounter (Signed)
Pt has appt 06/23/23 with Joni Reining, DNP.

## 2023-06-19 ENCOUNTER — Telehealth: Payer: Self-pay | Admitting: Cardiology

## 2023-06-19 ENCOUNTER — Other Ambulatory Visit: Payer: Self-pay | Admitting: Urology

## 2023-06-19 NOTE — Telephone Encounter (Signed)
Name: Joshua Curtis  DOB: 09-May-1951  MRN: [id number]  Primary Cardiologist: Olga Millers, MD  Chart reviewed as part of pre-operative protocol coverage. Because of Joshua Curtis's past medical history and time since last visit, he will require a follow-up in-office visit in order to better assess preoperative cardiovascular risk.  Pre-op covering staff: - Please schedule appointment and call patient to inform them. If patient already had an upcoming appointment within acceptable timeframe, please add "pre-op clearance" to the appointment notes so provider is aware. - Please contact requesting surgeon's office via preferred method (i.e, phone, fax) to inform them of need for appointment prior to surgery.  No medications indicated as needing help.  Sharlene Dory, PA-C  06/19/2023, 4:27 PM

## 2023-06-19 NOTE — Telephone Encounter (Signed)
Follow Up:      Zona called and said the clearance that was sent over here on 06-17-23, was not correct and not completed. I am doing a new clearance form.      Pre-operative Risk Assessment    Patient Name: Joshua Curtis  DOB: Nov 17, 1950 MRN: [id number]      Request for Surgical Clearance    Procedure:  A left robot- assisted  Laparoscopic partial versus radical Nephrectomy  Date of Surgery:  08-18-23                                  Surgeon:  Dr Heloise Purpura Surgeon's Group or Practice Name:   Phone number:  [phone] Fax number:  [phone]   Type of Clearance Requested:   - Medical    Type of Anesthesia:  General    Additional requests/questions:    SignedLaurence Ferrari   06/19/2023, 12:05 PM

## 2023-06-20 NOTE — Progress Notes (Unsigned)
Cardiology Office Note:  .   Date:  06/23/2023  ID:  Joshua Curtis, DOB Jan 21, 1951, MRN [id number] PCP: Corwin Levins, MD   HeartCare Providers Cardiologist:  Winfield Rast  }   History of Present Illness: .   Joshua Curtis is a 72 y.o. male who presents today for preoperative cardiac evaluation for left robotic assisted laparoscopic partial versus radical nephrectomy by Dr. Heloise Purpura scheduled for 08/18/2023.  The patient has not been seen in the office since 12/25/2020 by Dr. Jens Som.  He was hospitalized for right femoral endarterectomy and femoral to peroneal composite bypass by Dr. Myra Gianotti in August 2022.  Patient denies any chest pain, dyspnea on exertion, lower extremity pain or edema.  He is active walking every day and jogging twice a week up to 2 miles.  He is medically compliant.  ROS:   Studies Reviewed: Marland Kitchen   EKG Interpretation Date/Time:  Monday June 23 2023 13:54:50 EDT Ventricular Rate:  77 PR Interval:  128 QRS Duration:  84 QT Interval:  404 QTC Calculation: 457 R Axis:   75  Text Interpretation: Normal sinus rhythm Normal ECG When compared with ECG of 08-Dec-2021 14:47, PREVIOUS ECG IS PRESENT Confirmed by Joni Reining [id number]) on 06/23/2023 2:13:07 PM  Echocardiogram 12/08/2021 1. Left ventricular ejection fraction, by estimation, is 60 to 65%. The  left ventricle has normal function. The left ventricle has no regional  wall motion abnormalities. There is mild left ventricular hypertrophy.  Left ventricular diastolic parameters  were normal.   2. Right ventricular systolic function is normal. The right ventricular  size is normal. Tricuspid regurgitation signal is inadequate for assessing  PA pressure.   3. Left atrial size was mildly dilated.   4. RA central line noted.   5. No evidence of mitral valve regurgitation.   6. Aortic valve regurgitation is mild to moderate. Aortic valve  sclerosis/calcification is present, without any  evidence of aortic  stenosis.   7. The inferior vena cava is normal in size with greater than 50%  respiratory variability, suggesting right atrial pressure of 3 mmHg.   NM Stress Test 01/03/2021 The left ventricular ejection fraction is mildly decreased (45-54%). Nuclear stress EF: 48%. Blood pressure demonstrated a hypertensive response to exercise. Upsloping ST segment depression ST segment depression of 0.5 mm was noted during stress in the II and III leads, beginning at 5 minutes of stress, and returning to baseline after less than 1 minute of recovery. This is an intermediate risk study.   Negative stress test for ischemia.   EKG Interpretation Date/Time:  Monday June 23 2023 13:54:50 EDT Ventricular Rate:  77 PR Interval:  128 QRS Duration:  84 QT Interval:  404 QTC Calculation: 457 R Axis:   75  Text Interpretation: Normal sinus rhythm Normal ECG When compared with ECG of 08-Dec-2021 14:47, PREVIOUS ECG IS PRESENT Confirmed by Joni Reining [id number]) on 06/23/2023 2:13:07 PM   Physical Exam:   VS:  BP (!) 148/62 (BP Location: Right Arm, Patient Position: Sitting, Cuff Size: Normal)   Pulse 77   Ht 5\' 10"  (1.778 m)   Wt 168 lb (76.2 kg)   SpO2 96%   BMI 24.11 kg/m    Wt Readings from Last 3 Encounters:  06/23/23 168 lb (76.2 kg)  03/10/23 158 lb (71.7 kg)  09/30/22 167 lb (75.8 kg)    GEN: Well nourished, well developed in no acute distress NECK: No JVD; No carotid bruits CARDIAC: RRR, no murmurs, rubs,  gallops RESPIRATORY:  Clear to auscultation without rales, wheezing or rhonchi  ABDOMEN: Soft, non-tender, non-distended EXTREMITIES:  No edema; No deformity   ASSESSMENT AND PLAN: .   Pre-Operative Cardiac Clearance:  According to the Revised Cardiac Risk Index (RCRI), his Perioperative Risk of Major Cardiac Event is (%): 0.9  His Functional Capacity in METs is: 9.89 according to the Duke Activity Status Index (DASI).   Therefore, based on ACC/AHA  guidelines, patient would be at acceptable risk for the planned procedure without further cardiovascular testing. I will route this recommendation to the requesting party via Epic fax function.     2. Hypertension: BP is well controlled on Lotrel.  No changes in his regimen.   3. Hypercholesterolemia: : Continue rosuvastatin 20 mg daily.  Fasting lipids and LFTs should be followed.  Normally by PCP if not completed on next office visit will need to have this completed again.  Goal of LDL less than 70 with PAD.  4.  PAD: History of right femoral endarterectomy and Fem to peroneal composite bypass in 2022.  Followed by Dr. Myra Gianotti.   Bettey Mare. Liborio Nixon, ANP, AACC

## 2023-06-23 ENCOUNTER — Ambulatory Visit: Payer: PPO | Attending: Adult Health | Admitting: Adult Health

## 2023-06-23 ENCOUNTER — Encounter: Payer: Self-pay | Admitting: Adult Health

## 2023-06-23 VITALS — BP 148/62 | HR 77 | Ht 70.0 in | Wt 168.0 lb

## 2023-06-23 DIAGNOSIS — I2721 Secondary pulmonary arterial hypertension: Secondary | ICD-10-CM

## 2023-06-23 DIAGNOSIS — Z01818 Encounter for other preprocedural examination: Secondary | ICD-10-CM

## 2023-06-23 DIAGNOSIS — I779 Disorder of arteries and arterioles, unspecified: Secondary | ICD-10-CM

## 2023-06-23 DIAGNOSIS — E7800 Pure hypercholesterolemia, unspecified: Secondary | ICD-10-CM | POA: Diagnosis not present

## 2023-06-23 DIAGNOSIS — I10 Essential (primary) hypertension: Secondary | ICD-10-CM

## 2023-06-23 DIAGNOSIS — F17200 Nicotine dependence, unspecified, uncomplicated: Secondary | ICD-10-CM

## 2023-06-23 MED ORDER — ROSUVASTATIN CALCIUM 20 MG PO TABS: 20.0000 mg | ORAL_TABLET | Freq: Every day | ORAL | 3 refills | Status: DC

## 2023-06-23 NOTE — Patient Instructions (Signed)
Medication Instructions:  Decrease Crestor to 20 mg ( Take 1 20 mg Tablet Daily). *If you need a refill on your cardiac medications before your next appointment, please call your pharmacy*   Lab Work: No labs If you have labs (blood work) drawn today and your tests are completely normal, you will receive your results only by: MyChart Message (if you have MyChart) OR A paper copy in the mail If you have any lab test that is abnormal or we need to change your treatment, we will call you to review the results.   Testing/Procedures: No Testing   Follow-Up: At [facility], you and your health needs are our priority.  As part of our continuing mission to provide you with exceptional heart care, we have created designated Provider Care Teams.  These Care Teams include your primary Cardiologist (physician) and Advanced Practice Providers (APPs -  Physician Assistants and Nurse Practitioners) who all work together to provide you with the care you need, when you need it.  We recommend signing up for the patient portal called "MyChart".  Sign up information is provided on this After Visit Summary.  MyChart is used to connect with patients for Virtual Visits (Telemedicine).  Patients are able to view lab/test results, encounter notes, upcoming appointments, etc.  Non-urgent messages can be sent to your provider as well.   To learn more about what you can do with MyChart, go to [email or web address].    Your next appointment:   6 month(s)  Provider:   Olga Millers, MD

## 2023-07-02 DIAGNOSIS — D49512 Neoplasm of unspecified behavior of left kidney: Secondary | ICD-10-CM | POA: Diagnosis not present

## 2023-07-02 DIAGNOSIS — C642 Malignant neoplasm of left kidney, except renal pelvis: Secondary | ICD-10-CM | POA: Diagnosis not present

## 2023-07-02 DIAGNOSIS — R918 Other nonspecific abnormal finding of lung field: Secondary | ICD-10-CM | POA: Diagnosis not present

## 2023-07-02 DIAGNOSIS — I700 Atherosclerosis of aorta: Secondary | ICD-10-CM | POA: Diagnosis not present

## 2023-08-05 DIAGNOSIS — R8271 Bacteriuria: Secondary | ICD-10-CM | POA: Diagnosis not present

## 2023-08-05 DIAGNOSIS — D49512 Neoplasm of unspecified behavior of left kidney: Secondary | ICD-10-CM | POA: Diagnosis not present

## 2023-08-11 NOTE — Patient Instructions (Signed)
DUE TO COVID-19 ONLY TWO VISITORS  (aged 70 and older)  ARE ALLOWED TO COME WITH YOU AND STAY IN THE WAITING ROOM ONLY DURING PRE OP AND PROCEDURE.   **NO VISITORS ARE ALLOWED IN THE SHORT STAY AREA OR RECOVERY ROOM!!**  IF YOU WILL BE ADMITTED INTO THE HOSPITAL YOU ARE ALLOWED ONLY FOUR SUPPORT PEOPLE DURING VISITATION HOURS ONLY (7 AM -8PM)   The support person(s) must pass our screening, gel in and out, and wear a mask at all times, including in the patient's room. Patients must also wear a mask when staff or their support person are in the room. Visitors GUEST BADGE MUST BE WORN VISIBLY  One adult visitor may remain with you overnight and MUST be in the room by 8 P.M.     Your procedure is scheduled on: 08/18/23   Report to [facility] Main Entrance    Report to admitting at : 5:15 AM   Call this number if you have problems the morning of surgery [phone]      Clear liquids starting the day before surgery until : 4:00 AM DAY OF SURGERY. Drink plenty clear liquids the day before surgery.  Water Black Coffee (sugar ok, NO MILK/CREAM OR CREAMERS)  Tea (sugar ok, NO MILK/CREAM OR CREAMERS) regular and decaf                             Plain Jell-O (NO RED)                                           Fruit ices (not with fruit pulp, NO RED)                                     Popsicles (NO RED)                                                                  Juice: apple, WHITE grape, WHITE cranberry Sports drinks like Gatorade (NO RED)              FOLLOW BOWEL PREP AND ANY ADDITIONAL PRE OP INSTRUCTIONS YOU RECEIVED FROM YOUR SURGEON'S OFFICE!!!   Oral Hygiene is also important to reduce your risk of infection.                                    Remember - BRUSH YOUR TEETH THE MORNING OF SURGERY WITH YOUR REGULAR TOOTHPASTE  DENTURES WILL BE REMOVED PRIOR TO SURGERY PLEASE DO NOT APPLY "Poly grip" OR ADHESIVES!!!   Do NOT smoke after Midnight   Take these medicines  the morning of surgery with A SIP OF WATER: amlodipine.             You may not have any metal on your body including hair pins, jewelry, and body piercing             Do not wear lotions, powders, perfumes/cologne, or deodorant  Men may shave face and neck.   Do not bring valuables to the hospital. [facility] IS NOT             RESPONSIBLE   FOR VALUABLES.   Contacts, glasses, or bridgework may not be worn into surgery.   Bring small overnight bag day of surgery.   DO NOT BRING YOUR HOME MEDICATIONS TO THE HOSPITAL. PHARMACY WILL DISPENSE MEDICATIONS LISTED ON YOUR MEDICATION LIST TO YOU DURING YOUR ADMISSION IN THE HOSPITAL!    Patients discharged on the day of surgery will not be allowed to drive home.  Someone NEEDS to stay with you for the first 24 hours after anesthesia.   Special Instructions: Bring a copy of your healthcare power of attorney and living will documents         the day of surgery if you haven't scanned them before.              Please read over the following fact sheets you were given: IF YOU HAVE QUESTIONS ABOUT YOUR PRE-OP INSTRUCTIONS PLEASE CALL [phone]    [facility] - Preparing for Surgery Before surgery, you can play an important role.  Because skin is not sterile, your skin needs to be as free of germs as possible.  You can reduce the number of germs on your skin by washing with CHG (chlorahexidine gluconate) soap before surgery.  CHG is an antiseptic cleaner which kills germs and bonds with the skin to continue killing germs even after washing. Please DO NOT use if you have an allergy to CHG or antibacterial soaps.  If your skin becomes reddened/irritated stop using the CHG and inform your nurse when you arrive at Short Stay. Do not shave (including legs and underarms) for at least 48 hours prior to the first CHG shower.  You may shave your face/neck. Please follow these instructions carefully:  1.  Shower with CHG Soap the night before  surgery and the  morning of Surgery.  2.  If you choose to wash your hair, wash your hair first as usual with your  normal  shampoo.  3.  After you shampoo, rinse your hair and body thoroughly to remove the  shampoo.                           4.  Use CHG as you would any other liquid soap.  You can apply chg directly  to the skin and wash                       Gently with a scrungie or clean washcloth.  5.  Apply the CHG Soap to your body ONLY FROM THE NECK DOWN.   Do not use on face/ open                           Wound or open sores. Avoid contact with eyes, ears mouth and genitals (private parts).                       Wash face,  Genitals (private parts) with your normal soap.             6.  Wash thoroughly, paying special attention to the area where your surgery  will be performed.  7.  Thoroughly rinse your body with warm water from the neck down.  8.  DO  NOT shower/wash with your normal soap after using and rinsing off  the CHG Soap.                9.  Pat yourself dry with a clean towel.            10.  Wear clean pajamas.            11.  Place clean sheets on your bed the night of your first shower and do not  sleep with pets. Day of Surgery : Do not apply any lotions/deodorants the morning of surgery.  Please wear clean clothes to the hospital/surgery center.  FAILURE TO FOLLOW THESE INSTRUCTIONS MAY RESULT IN THE CANCELLATION OF YOUR SURGERY PATIENT SIGNATURE_________________________________  NURSE SIGNATURE__________________________________  ________________________________________________________________________ WHAT IS A BLOOD TRANSFUSION? Blood Transfusion Information  A transfusion is the replacement of blood or some of its parts. Blood is made up of multiple cells which provide different functions. Red blood cells carry oxygen and are used for blood loss replacement. White blood cells fight against infection. Platelets control bleeding. Plasma helps clot blood. Other  blood products are available for specialized needs, such as hemophilia or other clotting disorders. BEFORE THE TRANSFUSION  Who gives blood for transfusions?  Healthy volunteers who are fully evaluated to make sure their blood is safe. This is blood bank blood. Transfusion therapy is the safest it has ever been in the practice of medicine. Before blood is taken from a donor, a complete history is taken to make sure that person has no history of diseases nor engages in risky social behavior (examples are intravenous drug use or sexual activity with multiple partners). The donor's travel history is screened to minimize risk of transmitting infections, such as malaria. The donated blood is tested for signs of infectious diseases, such as HIV and hepatitis. The blood is then tested to be sure it is compatible with you in order to minimize the chance of a transfusion reaction. If you or a relative donates blood, this is often done in anticipation of surgery and is not appropriate for emergency situations. It takes many days to process the donated blood. RISKS AND COMPLICATIONS Although transfusion therapy is very safe and saves many lives, the main dangers of transfusion include:  Getting an infectious disease. Developing a transfusion reaction. This is an allergic reaction to something in the blood you were given. Every precaution is taken to prevent this. The decision to have a blood transfusion has been considered carefully by your caregiver before blood is given. Blood is not given unless the benefits outweigh the risks. AFTER THE TRANSFUSION Right after receiving a blood transfusion, you will usually feel much better and more energetic. This is especially true if your red blood cells have gotten low (anemic). The transfusion raises the level of the red blood cells which carry oxygen, and this usually causes an energy increase. The nurse administering the transfusion will monitor you carefully for  complications. HOME CARE INSTRUCTIONS  No special instructions are needed after a transfusion. You may find your energy is better. Speak with your caregiver about any limitations on activity for underlying diseases you may have. SEEK MEDICAL CARE IF:  Your condition is not improving after your transfusion. You develop redness or irritation at the intravenous (IV) site. SEEK IMMEDIATE MEDICAL CARE IF:  Any of the following symptoms occur over the next 12 hours: Shaking chills. You have a temperature by mouth above 102 F (38.9 C), not controlled by medicine. Chest, back,  or muscle pain. People around you feel you are not acting correctly or are confused. Shortness of breath or difficulty breathing. Dizziness and fainting. You get a rash or develop hives. You have a decrease in urine output. Your urine turns a dark color or changes to pink, red, or brown. Any of the following symptoms occur over the next 10 days: You have a temperature by mouth above 102 F (38.9 C), not controlled by medicine. Shortness of breath. Weakness after normal activity. The white part of the eye turns yellow (jaundice). You have a decrease in the amount of urine or are urinating less often. Your urine turns a dark color or changes to pink, red, or brown. Document Released: 08/16/2000 Document Revised: 11/11/2011 Document Reviewed: 04/04/2008 [facility] Patient Information 2014 [place], [place].  _______________________________________________________________________

## 2023-08-12 ENCOUNTER — Encounter (HOSPITAL_COMMUNITY)
Admission: RE | Admit: 2023-08-12 | Discharge: 2023-08-12 | Disposition: A | Discharge status: Home / Self Care | Payer: PPO | Source: Ambulatory Visit | Attending: Urology | Admitting: Urology

## 2023-08-12 ENCOUNTER — Other Ambulatory Visit: Payer: Self-pay

## 2023-08-12 ENCOUNTER — Encounter (HOSPITAL_COMMUNITY): Payer: Self-pay

## 2023-08-12 VITALS — BP 146/76 | HR 90 | Temp 98.1°F | Ht 70.0 in | Wt 167.0 lb

## 2023-08-12 DIAGNOSIS — Z8546 Personal history of malignant neoplasm of prostate: Secondary | ICD-10-CM | POA: Insufficient documentation

## 2023-08-12 DIAGNOSIS — I2510 Atherosclerotic heart disease of native coronary artery without angina pectoris: Secondary | ICD-10-CM | POA: Diagnosis not present

## 2023-08-12 DIAGNOSIS — Z79899 Other long term (current) drug therapy: Secondary | ICD-10-CM | POA: Insufficient documentation

## 2023-08-12 DIAGNOSIS — J449 Chronic obstructive pulmonary disease, unspecified: Secondary | ICD-10-CM | POA: Insufficient documentation

## 2023-08-12 DIAGNOSIS — I272 Pulmonary hypertension, unspecified: Secondary | ICD-10-CM | POA: Diagnosis not present

## 2023-08-12 DIAGNOSIS — Z01818 Encounter for other preprocedural examination: Secondary | ICD-10-CM

## 2023-08-12 DIAGNOSIS — N2889 Other specified disorders of kidney and ureter: Secondary | ICD-10-CM | POA: Insufficient documentation

## 2023-08-12 DIAGNOSIS — F17210 Nicotine dependence, cigarettes, uncomplicated: Secondary | ICD-10-CM | POA: Diagnosis not present

## 2023-08-12 DIAGNOSIS — Z01812 Encounter for preprocedural laboratory examination: Secondary | ICD-10-CM | POA: Diagnosis not present

## 2023-08-12 DIAGNOSIS — Z9889 Other specified postprocedural states: Secondary | ICD-10-CM | POA: Insufficient documentation

## 2023-08-12 DIAGNOSIS — I700 Atherosclerosis of aorta: Secondary | ICD-10-CM | POA: Diagnosis not present

## 2023-08-12 DIAGNOSIS — I10 Essential (primary) hypertension: Secondary | ICD-10-CM | POA: Diagnosis not present

## 2023-08-12 DIAGNOSIS — I739 Peripheral vascular disease, unspecified: Secondary | ICD-10-CM | POA: Insufficient documentation

## 2023-08-12 LAB — BASIC METABOLIC PANEL
Anion gap: 8 (ref 5–15)
BUN: 22 mg/dL (ref 8–23)
CO2: 24 mmol/L (ref 22–32)
Calcium: 8.5 mg/dL — ABNORMAL LOW (ref 8.9–10.3)
Chloride: 106 mmol/L (ref 98–111)
Creatinine, Ser: 1.35 mg/dL — ABNORMAL HIGH (ref 0.61–1.24)
GFR, Estimated: 56 mL/min — ABNORMAL LOW (ref >60)
Glucose, Bld: 100 mg/dL — ABNORMAL HIGH (ref 70–99)
Potassium: 3.9 mmol/L (ref 3.5–5.1)
Sodium: 138 mmol/L (ref 135–145)

## 2023-08-12 LAB — CBC
HCT: 38.7 % — ABNORMAL LOW (ref 39.0–52.0)
Hemoglobin: 13 g/dL (ref 13.0–17.0)
MCH: 31.6 pg (ref 26.0–34.0)
MCHC: 33.6 g/dL (ref 30.0–36.0)
MCV: 93.9 fL (ref 80.0–100.0)
Platelets: 204 10*3/uL (ref 150–400)
RBC: 4.12 MIL/uL — ABNORMAL LOW (ref 4.22–5.81)
RDW: 14.6 % (ref 11.5–15.5)
WBC: 5.4 10*3/uL (ref 4.0–10.5)
nRBC: 0 % (ref 0.0–0.2)

## 2023-08-12 NOTE — Progress Notes (Signed)
For Anesthesia: PCP - Corwin Levins, MD  Cardiologist - Lewayne Bunting, MD  Clearance: Jodelle Gross, NP : 06/23/23 Bowel Prep reminder: Reviewed.  Chest x-ray -  EKG - 06/23/23 Stress Test - 01/03/21 ECHO - 12/08/21 Cardiac Cath -  Pacemaker/ICD device last checked: Pacemaker orders received: Device Rep notified:  Spinal Cord Stimulator:N/A  Sleep Study - N/A CPAP -   Fasting Blood Sugar - N/A Checks Blood Sugar _____ times a day Date and result of last Hgb A1c- 6.3: 03/10/23  Last dose of GLP1 agonist- N/A GLP1 instructions:   Last dose of SGLT-2 inhibitors- N/A SGLT-2 instructions:   Blood Thinner Instructions:N/A Aspirin Instructions: Last Dose:  Activity level: Can go up a flight of stairs and activities of daily living without stopping and without chest pain and/or shortness of breath   Able to exercise without chest pain and/or shortness of breath  Anesthesia review: Hx: HTN,COPD,CAD,PAH,Smoker.  Patient denies shortness of breath, fever, cough and chest pain at PAT appointment   Patient verbalized understanding of instructions that were given to them at the PAT appointment. Patient was also instructed that they will need to review over the PAT instructions again at home before surgery.

## 2023-08-13 ENCOUNTER — Encounter (HOSPITAL_COMMUNITY): Payer: Self-pay

## 2023-08-13 NOTE — Progress Notes (Signed)
DISCUSSION: Joshua Curtis is a 72 yo male who presents to PAT prior to XI LEFT ROBOTIC ASSISTED LAPAROSCOPIC PARTIAL VERSUS RADICAL NEPHRECTOMY on 08/18/23 with Dr. Laverle Patter. PMH of smoking, HTN, CAD and aortic atherosclerosis (by CT scan), pulmonary HTN, PAD s/p right leg bypass (2022), COPD (by CT), hx of prostate cancer s/p prostatectomy (2022), left renal mass.  Patient was seen by Cardiology on 06/23/23 for pre-op clearance. He was previously evaluated by Cardiology in 2022 due to DOE and underwent stress testing which was low-risk/negative for ischemia. Noted to be doing well from cardiac standpoint. Cleared for surgery:  "Pre-Operative Cardiac Clearance:  According to the Revised Cardiac Risk Index (RCRI), his Perioperative Risk of Major Cardiac Event is (%): 0.9   His Functional Capacity in METs is: 9.89 according to the [facility] Activity Status Index (DASI).    Therefore, based on ACC/AHA guidelines, patient would be at acceptable risk for the planned procedure without further cardiovascular testing. I will route this recommendation to the requesting party via Epic fax function."    VS: BP (!) 146/76   Pulse 90   Temp 36.7 C (Oral)   Ht 5\' 10"  (1.778 m)   Wt 75.8 kg   SpO2 95%   BMI 23.96 kg/m   PROVIDERS: Corwin Levins, MD Cardiology: Olga Millers, MD  LABS: Labs reviewed: Acceptable for surgery. (all labs ordered are listed, but only abnormal results are displayed)  Labs Reviewed  CBC - Abnormal; Notable for the following components:      Result Value   RBC 4.12 (*)    HCT 38.7 (*)    All other components within normal limits  BASIC METABOLIC PANEL - Abnormal; Notable for the following components:   Glucose, Bld 100 (*)    Creatinine, Ser 1.35 (*)    Calcium 8.5 (*)    GFR, Estimated 56 (*)    All other components within normal limits  TYPE AND SCREEN     IMAGES:   EKG:  NSR, rate 77  CV: Echocardiogram 12/08/2021  1. Left ventricular ejection fraction, by  estimation, is 60 to 65%. The  left ventricle has normal function. The left ventricle has no regional  wall motion abnormalities. There is mild left ventricular hypertrophy.  Left ventricular diastolic parameters  were normal.   2. Right ventricular systolic function is normal. The right ventricular  size is normal. Tricuspid regurgitation signal is inadequate for assessing  PA pressure.   3. Left atrial size was mildly dilated.   4. RA central line noted.   5. No evidence of mitral valve regurgitation.   6. Aortic valve regurgitation is mild to moderate. Aortic valve  sclerosis/calcification is present, without any evidence of aortic  stenosis.   7. The inferior vena cava is normal in size with greater than 50%  respiratory variability, suggesting right atrial pressure of 3 mmHg.    NM Stress Test 01/03/2021  The left ventricular ejection fraction is mildly decreased (45-54%). Nuclear stress EF: 48%. Blood pressure demonstrated a hypertensive response to exercise. Upsloping ST segment depression ST segment depression of 0.5 mm was noted during stress in the II and III leads, beginning at 5 minutes of stress, and returning to baseline after less than 1 minute of recovery. This is an intermediate risk study.   Negative stress test for ischemia. Past Medical History:  Diagnosis Date   Anemia, iron deficiency 04/02/2016   Aortic atherosclerosis (HCC)    Arthritis    rt knee   BPH (  benign prostatic hyperplasia) 04/10/2017   Cancer (HCC)    prostate   COLONIC POLYPS, HX OF 11/24/2007   COPD (chronic obstructive pulmonary disease) (HCC)    pt denies this- no respiratory issues of any kind per pt   Coronary artery calcification seen on CAT scan    DIVERTICULOSIS, COLON 11/24/2007   ED (erectile dysfunction)    GERD (gastroesophageal reflux disease)    HLD (hyperlipidemia) 09/06/2020   HYPERTENSION 11/24/2007   Increased prostate specific antigen (PSA) velocity 06/22/2011   PAH (pulmonary  artery hypertension) (HCC)    Peripheral vascular disease (HCC)     Past Surgical History:  Procedure Laterality Date   ABDOMINAL AORTOGRAM W/LOWER EXTREMITY N/A 07/04/2020   Procedure: ABDOMINAL AORTOGRAM W/LOWER EXTREMITY;  Surgeon: Nada Libman, MD;  Location: [facility];  Service: Cardiovascular;  Laterality: N/A;   ABDOMINAL AORTOGRAM W/LOWER EXTREMITY N/A 07/18/2020   Procedure: ABDOMINAL AORTOGRAM W/LOWER EXTREMITY;  Surgeon: Nada Libman, MD;  Location: [facility];  Service: Cardiovascular;  Laterality: N/A;   ABDOMINAL AORTOGRAM W/LOWER EXTREMITY N/A 04/04/2021   Procedure: ABDOMINAL AORTOGRAM W/LOWER EXTREMITY;  Surgeon: Leonie Douglas, MD;  Location: [facility];  Service: Cardiovascular;  Laterality: N/A;   BYPASS GRAFT FEMORAL-PERONEAL Right 04/13/2021   Procedure: RIGHT LEG FEMORAL-PERONEAL BYPASS USING PROPATEN GRAFT;  Surgeon: Nada Libman, MD;  Location: [facility] OR;  Service: Vascular;  Laterality: Right;   COLONOSCOPY     ESOPHAGOGASTRODUODENOSCOPY (EGD) WITH PROPOFOL N/A 12/09/2021   Procedure: ESOPHAGOGASTRODUODENOSCOPY (EGD) WITH PROPOFOL;  Surgeon: Jeani Hawking, MD;  Location: [facility];  Service: Gastroenterology;  Laterality: N/A;   HOT HEMOSTASIS N/A 12/09/2021   Procedure: HOT HEMOSTASIS (ARGON PLASMA COAGULATION/BICAP);  Surgeon: Jeani Hawking, MD;  Location: [facility];  Service: Gastroenterology;  Laterality: N/A;   PATCH ANGIOPLASTY Right 04/13/2021   Procedure: Maud Deed USING VEIN PATCH ANGIOPLASTY;  Surgeon: Nada Libman, MD;  Location: [facility];  Service: Vascular;  Laterality: Right;   PERIPHERAL VASCULAR BALLOON ANGIOPLASTY Right 07/18/2020   Procedure: PERIPHERAL VASCULAR BALLOON ANGIOPLASTY;  Surgeon: Nada Libman, MD;  Location: [facility];  Service: Cardiovascular;  Laterality: Right;  tibioperoneal trunk and posterior tibial   PERIPHERAL VASCULAR INTERVENTION  07/04/2020   Procedure: PERIPHERAL  VASCULAR INTERVENTION;  Surgeon: Nada Libman, MD;  Location: [facility];  Service: Cardiovascular;;  Lt. SFA and Popliteal   PERIPHERAL VASCULAR INTERVENTION Right 07/18/2020   Procedure: PERIPHERAL VASCULAR INTERVENTION;  Surgeon: Nada Libman, MD;  Location: [facility];  Service: Cardiovascular;  Laterality: Right;  Femoral Popliteal   ROBOT ASSISTED LAPAROSCOPIC RADICAL PROSTATECTOMY N/A 01/08/2021   Procedure: XI ROBOTIC ASSISTED LAPAROSCOPIC RADICAL PROSTATECTOMY LEVEL 3;  Surgeon: Heloise Purpura, MD;  Location: [facility];  Service: Urology;  Laterality: N/A;   SCLEROTHERAPY  12/09/2021   Procedure: Susa Day;  Surgeon: Jeani Hawking, MD;  Location: [facility];  Service: Gastroenterology;;    MEDICATIONS:  amLODipine (NORVASC) 10 MG tablet   ferrous sulfate 325 (65 FE) MG tablet   rosuvastatin (CRESTOR) 20 MG tablet   No current facility-administered medications for this encounter.   Marcille Blanco [facility] Short Stay/Anesthesiology [facility] Phone [phone] 08/13/2023 1:47 PM

## 2023-08-13 NOTE — Anesthesia Preprocedure Evaluation (Addendum)
Anesthesia Evaluation  Patient identified by MRN, date of birth, ID band Patient awake    Reviewed: Allergy & Precautions, NPO status , Patient's Chart, lab work & pertinent test results  Airway Mallampati: II  TM Distance: >3 FB Neck ROM: Full    Dental no notable dental hx.    Pulmonary COPD, Current Smoker and Patient abstained from smoking.   Pulmonary exam normal        Cardiovascular hypertension, Pt. on medications + CAD and + Peripheral Vascular Disease   Rhythm:Regular Rate:Normal  Echocardiogram 12/08/2021   1. Left ventricular ejection fraction, by estimation, is 60 to 65%. The  left ventricle has normal function. The left ventricle has no regional  wall motion abnormalities. There is mild left ventricular hypertrophy.  Left ventricular diastolic parameters  were normal.   2. Right ventricular systolic function is normal. The right ventricular  size is normal. Tricuspid regurgitation signal is inadequate for assessing  PA pressure.   3. Left atrial size was mildly dilated.   4. RA central line noted.   5. No evidence of mitral valve regurgitation.   6. Aortic valve regurgitation is mild to moderate. Aortic valve  sclerosis/calcification is present, without any evidence of aortic  stenosis.   7. The inferior vena cava is normal in size with greater than 50%  respiratory variability, suggesting right atrial pressure of 3 mmHg.     Neuro/Psych negative neurological ROS  negative psych ROS   GI/Hepatic Neg liver ROS,GERD  ,,  Endo/Other  negative endocrine ROS    Renal/GU Renal diseaseRenal mass  negative genitourinary   Musculoskeletal  (+) Arthritis , Osteoarthritis,    Abdominal Normal abdominal exam  (+)   Peds  Hematology  (+) Blood dyscrasia, anemia Lab Results      Component                Value               Date                      WBC                      5.4                 08/12/2023                 HGB                      13.0                08/12/2023                HCT                      38.7 (L)            08/12/2023                MCV                      93.9                08/12/2023                PLT                      204  08/12/2023              Anesthesia Other Findings   Reproductive/Obstetrics                             Anesthesia Physical Anesthesia Plan  ASA: 3  Anesthesia Plan: General   Post-op Pain Management:    Induction: Intravenous  PONV Risk Score and Plan: 1 and Ondansetron, Dexamethasone and Treatment may vary due to age or medical condition  Airway Management Planned: Mask and Oral ETT  Additional Equipment: None  Intra-op Plan:   Post-operative Plan: Extubation in OR  Informed Consent: I have reviewed the patients History and Physical, chart, labs and discussed the procedure including the risks, benefits and alternatives for the proposed anesthesia with the patient or authorized representative who has indicated his/her understanding and acceptance.     Dental advisory given  Plan Discussed with: CRNA  Anesthesia Plan Comments: (See PAT note from 12/10 by Sherlie Ban PA-C )        Anesthesia Quick Evaluation

## 2023-08-15 NOTE — H&P (Signed)
Office Visit Report     08/05/2023   --------------------------------------------------------------------------------   Joshua Curtis  MRN: [id number]  DOB: 07-04-1951, 72 year old Male  SSN: [id number]   PRIMARY CARE:  Oliver Barre, MD  PRIMARY CARE FAX:  [phone]  REFERRING:  Azucena Kuba  PROVIDER:  Heloise Purpura, M.D.  TREATING:  Joshua Curtis, Joshua Curtis  LOCATION:  [facility] [phone]     --------------------------------------------------------------------------------   CC/HPI: Pt presents today for pre-operative history and physical exam in anticipation of left robotic assisted lap partial vs radical nephrectomy by Dr. Laverle Patter on 08/18/23. He is doing well and is without complaint.   Pt denies F/C, HA, CP, SOB, N/V, diarrhea/constipation, back pain, flank pain, hematuria, and dysuria.    HX:   Left renal mass   Joshua Curtis is a 72 year old gentleman who I have treated for prostate cancer. He is status post robotic radical prostatectomy May 2022. He did develop significant anemia last year and went up undergoing a CT scan of the abdomen and pelvis which demonstrated a 1.8 cm indeterminate left renal mass. A follow-up ultrasound also done in the spring 2023 confirmed an indeterminate lesion. He underwent an MRI more recently to follow-up on this lesion which was performed on 04/30/2023. This demonstrated a 4.2 cm Bosniak 3 left renal mass. There was no evidence of regional lymphadenopathy or metastatic disease noted. He has not undergone recent chest imaging. He denies hematuria. There is no family history of kidney cancer. His serum creatinine was 1.64 in July.     ALLERGIES: No Allergies    MEDICATIONS: Amlodipine Besylate-Benazepril 10 mg-40 mg capsule  Ferrous Sulfate 325 mg (65 mg iron) tablet  Rosuvastatin Calcium 40 mg tablet     GU PSH: Cystoscopy - 2022 Prostate Needle Biopsy - 2022, 2017, 2012 Robotic Radical Prostatectomy -  2022       PSH Notes: Biopsy Of The Prostate Needle, No Surgical Problems  bilateral iliac stents by Dr. Myra Gianotti  Right LE bypass Dr. Myra Gianotti   EGD for GI bleed 2023   NON-GU PSH: Anesth, Femoral Artery Surg Surgical Pathology, Gross And Microscopic Examination For Prostate Needle - 2022, 2017 Visit Complexity (formerly GPC1X) - 04/25/2023     GU PMH: Left renal neoplasm - 07/02/2023, - 06/17/2023 ED due to arterial insufficiency - 04/25/2023, - 09/03/2022, - 2023 Prostate Cancer - 04/25/2023, - 09/03/2022, - 2023, - 2022, - 2022, - 2022, - 2022, - 2022 Acute Cystitis/UTI - 09/03/2022 Stress Incontinence - 2022, - 2022 Weak Urinary Stream - 2022 Incomplete bladder emptying - 2022      PMH Notes: 1) Prostate cancer: He is s/p a UNS RAL radical prostatectomy and BPLND on 01/08/21.   Diagnosis: pT2 N0 Mx, Gleason 3+4=7 adenocarcinoma with negative surgical margins  Pretreatment PSA: 10.7  Pretreatment SHIM score: 3    COPD  pulm artery HTN  HLD   NON-GU PMH: Diverticulosis, Diverticulosis - 2014 Hypertension Iron deficiency anemia, unspecified Peripheral vascular disease    FAMILY HISTORY: Death In The Family Mother - Runs In Family Diabetes - Runs In Family Family Health Status - Father alive at age 37 - Runs In Family Hypertension - Runs In Family Stroke Syndrome - Runs In Family   SOCIAL HISTORY: Marital Status: Married Preferred Language: English; Ethnicity: Not Hispanic Or Latino; Race: Black or African American Current Smoking Status: Patient smokes. Has smoked since 01/01/1991. Smokes less than 1/2 pack per day.   Tobacco Use  Assessment Completed: Used Tobacco in last 30 days? Does not use smokeless tobacco. Types of alcohol consumed: Beer. Light Drinker.  Does not use drugs. Drinks 4+ caffeinated drinks per day. Has had a blood transfusion.     Notes: 1 cigarette per day; trying to quit  1 mixed drink per day  Transfusion after iliac stents    REVIEW OF  SYSTEMS:    GU Review Male:   Patient reports get up at night to urinate and leakage of urine. Patient denies frequent urination, hard to postpone urination, burning/ pain with urination, stream starts and stops, trouble starting your stream, have to strain to urinate , erection problems, and penile pain.  Gastrointestinal (Upper):   Patient denies nausea, vomiting, and indigestion/ heartburn.  Gastrointestinal (Lower):   Patient denies diarrhea and constipation.  Constitutional:   Patient denies fever, night sweats, weight loss, and fatigue.  Skin:   Patient denies skin rash/ lesion and itching.  Eyes:   Patient denies blurred vision and double vision.  Ears/ Nose/ Throat:   Patient denies sore throat and sinus problems.  Hematologic/Lymphatic:   Patient denies swollen glands and easy bruising.  Cardiovascular:   Patient denies leg swelling and chest pains.  Respiratory:   Patient denies cough and shortness of breath.  Endocrine:   Patient denies excessive thirst.  Musculoskeletal:   Patient denies back pain and joint pain.  Neurological:   Patient denies headaches and dizziness.  Psychologic:   Patient denies anxiety and depression.   VITAL SIGNS:      08/05/2023 01:41 PM  BP 150/77 mmHg  Pulse 89 /min  Temperature 98.6 F / 37 C   MULTI-SYSTEM PHYSICAL EXAMINATION:    Constitutional: Well-nourished. No physical deformities. Normally developed. Good grooming.  Neck: Neck symmetrical, not swollen. Normal tracheal position.  Respiratory: Faint exp. wheezes bilateral. No labored breathing, no use of accessory muscles.   Cardiovascular: Regular rate and rhythm. No murmur, no gallop.   Lymphatic: No enlargement of neck, axillae, groin.  Skin: No paleness, no jaundice, no cyanosis. No lesion, no ulcer, no rash.  Neurologic / Psychiatric: Oriented to time, oriented to place, oriented to person. No depression, no anxiety, no agitation.  Gastrointestinal: No mass, no tenderness, no rigidity,  non obese abdomen.  Eyes: Normal conjunctivae. Normal eyelids.  Ears, Nose, Mouth, and Throat: Left ear no scars, no lesions, no masses. Right ear no scars, no lesions, no masses. Nose no scars, no lesions, no masses. Normal hearing. Normal lips.  Musculoskeletal: Normal gait and station of head and neck.     Complexity of Data:  Records Review:   Previous Patient Records  Urine Test Review:   Urinalysis   08/05/23  Urinalysis  Urine Appearance Clear   Urine Color Yellow   Urine Glucose Neg mg/dL  Urine Bilirubin Neg mg/dL  Urine Ketones Neg mg/dL  Urine Specific Gravity 1.010   Urine Blood Neg ery/uL  Urine pH 6.0   Urine Protein Trace mg/dL  Urine Urobilinogen 0.2 mg/dL  Urine Nitrites Neg   Urine Leukocyte Esterase 1+ leu/uL  Urine WBC/hpf 0 - 5/hpf   Urine RBC/hpf NS (Not Seen)   Urine Epithelial Cells NS (Not Seen)   Urine Bacteria Few (10-25/hpf)   Urine Mucous Not Present   Urine Yeast NS (Not Seen)   Urine Trichomonas Not Present   Urine Cystals NS (Not Seen)   Urine Casts NS (Not Seen)   Urine Sperm Not Present    PROCEDURES:  Urinalysis w/Scope - 81001 Dipstick Dipstick Cont'd Micro  Color: Yellow Bilirubin: Neg mg/dL WBC/hpf: 0 - 5/hpf  Appearance: Clear Ketones: Neg mg/dL RBC/hpf: NS (Not Seen)  Specific Gravity: 1.010 Blood: Neg ery/uL Bacteria: Few (10-25/hpf)  pH: 6.0 Protein: Trace mg/dL Cystals: NS (Not Seen)  Glucose: Neg mg/dL Urobilinogen: 0.2 mg/dL Casts: NS (Not Seen)    Nitrites: Neg Trichomonas: Not Present    Leukocyte Esterase: 1+ leu/uL Mucous: Not Present      Epithelial Cells: NS (Not Seen)      Yeast: NS (Not Seen)      Sperm: Not Present    ASSESSMENT:      ICD-10 Details  1 GU:   Left renal neoplasm - D49.512    PLAN:           Orders Labs Urine Culture          Schedule Return Visit/Planned Activity: Keep Scheduled Appointment - Schedule Surgery          Document Letter(s):  Created for Patient: Clinical Summary          Notes:   There are no changes in the patients history or physical exam since last evaluation by Dr. Laverle Patter. Pt is scheduled to undergo left RAL partial vs radical nephrectomy on 08/18/23.   Urine for culture to r/o infection   All pt's questions were answered to the best of my ability.          Next Appointment:      Next Appointment: 08/18/2023 07:15 AM    Appointment Type: Surgery     Location: [facility] [phone]    Provider: Heloise Purpura, M.D.    Reason for Visit: WL/OBS (L) RA LAP PARTIAL VS RADICAL NEPHRECTOMY WITH AMANDA      * Signed by Joshua Amor, PA on 08/05/23 at 2:06 PM (EST)*

## 2023-08-18 ENCOUNTER — Encounter (HOSPITAL_COMMUNITY): Payer: PPO

## 2023-08-18 ENCOUNTER — Inpatient Hospital Stay (HOSPITAL_COMMUNITY)
Admission: RE | Admit: 2023-08-18 | Discharge: 2023-08-22 | DRG: 657 | Disposition: A | Discharge status: Home / Self Care | Payer: PPO | Attending: Urology | Admitting: Urology

## 2023-08-18 ENCOUNTER — Encounter (HOSPITAL_COMMUNITY)
Admission: RE | Disposition: A | Discharge status: Home / Self Care | Payer: Self-pay | Source: Home / Self Care | Attending: Urology

## 2023-08-18 ENCOUNTER — Encounter (HOSPITAL_COMMUNITY): Payer: Self-pay | Admitting: Urology

## 2023-08-18 ENCOUNTER — Ambulatory Visit (HOSPITAL_BASED_OUTPATIENT_CLINIC_OR_DEPARTMENT_OTHER): Payer: PPO | Admitting: Anesthesiology

## 2023-08-18 ENCOUNTER — Ambulatory Visit: Payer: PPO

## 2023-08-18 ENCOUNTER — Other Ambulatory Visit (HOSPITAL_COMMUNITY): Payer: PPO

## 2023-08-18 ENCOUNTER — Other Ambulatory Visit: Payer: Self-pay

## 2023-08-18 ENCOUNTER — Ambulatory Visit (HOSPITAL_COMMUNITY): Payer: PPO | Admitting: Medical

## 2023-08-18 DIAGNOSIS — K567 Ileus, unspecified: Secondary | ICD-10-CM | POA: Diagnosis not present

## 2023-08-18 DIAGNOSIS — Z8546 Personal history of malignant neoplasm of prostate: Secondary | ICD-10-CM

## 2023-08-18 DIAGNOSIS — I2721 Secondary pulmonary arterial hypertension: Secondary | ICD-10-CM | POA: Diagnosis present

## 2023-08-18 DIAGNOSIS — I129 Hypertensive chronic kidney disease with stage 1 through stage 4 chronic kidney disease, or unspecified chronic kidney disease: Secondary | ICD-10-CM | POA: Diagnosis present

## 2023-08-18 DIAGNOSIS — I10 Essential (primary) hypertension: Secondary | ICD-10-CM | POA: Diagnosis not present

## 2023-08-18 DIAGNOSIS — Z79899 Other long term (current) drug therapy: Secondary | ICD-10-CM

## 2023-08-18 DIAGNOSIS — F17210 Nicotine dependence, cigarettes, uncomplicated: Secondary | ICD-10-CM | POA: Diagnosis present

## 2023-08-18 DIAGNOSIS — D49512 Neoplasm of unspecified behavior of left kidney: Secondary | ICD-10-CM | POA: Diagnosis not present

## 2023-08-18 DIAGNOSIS — K219 Gastro-esophageal reflux disease without esophagitis: Secondary | ICD-10-CM | POA: Diagnosis present

## 2023-08-18 DIAGNOSIS — I739 Peripheral vascular disease, unspecified: Secondary | ICD-10-CM | POA: Diagnosis present

## 2023-08-18 DIAGNOSIS — Z860100 Personal history of colon polyps, unspecified: Secondary | ICD-10-CM

## 2023-08-18 DIAGNOSIS — N281 Cyst of kidney, acquired: Secondary | ICD-10-CM | POA: Diagnosis not present

## 2023-08-18 DIAGNOSIS — J449 Chronic obstructive pulmonary disease, unspecified: Secondary | ICD-10-CM | POA: Diagnosis present

## 2023-08-18 DIAGNOSIS — Q2381 Bicuspid aortic valve: Secondary | ICD-10-CM

## 2023-08-18 DIAGNOSIS — K5730 Diverticulosis of large intestine without perforation or abscess without bleeding: Secondary | ICD-10-CM | POA: Diagnosis present

## 2023-08-18 DIAGNOSIS — Z833 Family history of diabetes mellitus: Secondary | ICD-10-CM

## 2023-08-18 DIAGNOSIS — N183 Chronic kidney disease, stage 3 unspecified: Secondary | ICD-10-CM | POA: Diagnosis present

## 2023-08-18 DIAGNOSIS — K9189 Other postprocedural complications and disorders of digestive system: Secondary | ICD-10-CM | POA: Diagnosis not present

## 2023-08-18 DIAGNOSIS — I712 Thoracic aortic aneurysm, without rupture, unspecified: Secondary | ICD-10-CM | POA: Diagnosis present

## 2023-08-18 DIAGNOSIS — N2889 Other specified disorders of kidney and ureter: Secondary | ICD-10-CM

## 2023-08-18 DIAGNOSIS — E785 Hyperlipidemia, unspecified: Secondary | ICD-10-CM | POA: Diagnosis present

## 2023-08-18 DIAGNOSIS — Z823 Family history of stroke: Secondary | ICD-10-CM

## 2023-08-18 DIAGNOSIS — Z8249 Family history of ischemic heart disease and other diseases of the circulatory system: Secondary | ICD-10-CM

## 2023-08-18 DIAGNOSIS — I2510 Atherosclerotic heart disease of native coronary artery without angina pectoris: Secondary | ICD-10-CM | POA: Diagnosis present

## 2023-08-18 DIAGNOSIS — C642 Malignant neoplasm of left kidney, except renal pelvis: Secondary | ICD-10-CM | POA: Diagnosis not present

## 2023-08-18 DIAGNOSIS — D509 Iron deficiency anemia, unspecified: Secondary | ICD-10-CM | POA: Diagnosis present

## 2023-08-18 HISTORY — PX: ROBOTIC ASSITED PARTIAL NEPHRECTOMY: SHX6087

## 2023-08-18 LAB — BASIC METABOLIC PANEL
Anion gap: 7 (ref 5–15)
BUN: 18 mg/dL (ref 8–23)
CO2: 26 mmol/L (ref 22–32)
Calcium: 8 mg/dL — ABNORMAL LOW (ref 8.9–10.3)
Chloride: 104 mmol/L (ref 98–111)
Creatinine, Ser: 1.83 mg/dL — ABNORMAL HIGH (ref 0.61–1.24)
GFR, Estimated: 39 mL/min — ABNORMAL LOW (ref >60)
Glucose, Bld: 150 mg/dL — ABNORMAL HIGH (ref 70–99)
Potassium: 4.7 mmol/L (ref 3.5–5.1)
Sodium: 137 mmol/L (ref 135–145)

## 2023-08-18 LAB — HEMOGLOBIN AND HEMATOCRIT, BLOOD
HCT: 41.7 % (ref 39.0–52.0)
Hemoglobin: 13.6 g/dL (ref 13.0–17.0)

## 2023-08-18 LAB — TYPE AND SCREEN
ABO/RH(D): O POS
Antibody Screen: NEGATIVE

## 2023-08-18 SURGERY — NEPHRECTOMY, PARTIAL, ROBOT-ASSISTED
Anesthesia: General | Laterality: Left

## 2023-08-18 MED ORDER — ORAL CARE MOUTH RINSE: 15.0000 mL | OROMUCOSAL | Status: DC | PRN

## 2023-08-18 MED ORDER — SUGAMMADEX SODIUM 200 MG/2ML IV SOLN
INTRAVENOUS | Status: DC | PRN
  Administered 2023-08-18: 200 mg via INTRAVENOUS

## 2023-08-18 MED ORDER — DIPHENHYDRAMINE HCL 50 MG/ML IJ SOLN: 12.5000 mg | Freq: Four times a day (QID) | INTRAMUSCULAR | Status: DC | PRN

## 2023-08-18 MED ORDER — FENTANYL CITRATE PF 50 MCG/ML IJ SOSY
PREFILLED_SYRINGE | INTRAMUSCULAR | Status: AC
  Administered 2023-08-18: 25 ug via INTRAVENOUS
  Filled 2023-08-18: qty 2

## 2023-08-18 MED ORDER — SODIUM CHLORIDE (PF) 0.9 % IJ SOLN
INTRAMUSCULAR | Status: AC
  Filled 2023-08-18: qty 20

## 2023-08-18 MED ORDER — ROSUVASTATIN CALCIUM 20 MG PO TABS
20.0000 mg | ORAL_TABLET | Freq: Every day | ORAL | Status: DC
  Administered 2023-08-19 – 2023-08-22 (×4): 20 mg via ORAL
  Filled 2023-08-18 (×4): qty 1

## 2023-08-18 MED ORDER — MORPHINE SULFATE (PF) 2 MG/ML IV SOLN
2.0000 mg | INTRAVENOUS | Status: DC | PRN
  Filled 2023-08-18: qty 1

## 2023-08-18 MED ORDER — FENTANYL CITRATE (PF) 250 MCG/5ML IJ SOLN
INTRAMUSCULAR | Status: AC
  Filled 2023-08-18: qty 5

## 2023-08-18 MED ORDER — LIDOCAINE HCL 2 % IJ SOLN
INTRAMUSCULAR | Status: AC
  Filled 2023-08-18: qty 20

## 2023-08-18 MED ORDER — ORAL CARE MOUTH RINSE: 15.0000 mL | Freq: Once | OROMUCOSAL | Status: AC

## 2023-08-18 MED ORDER — HEMOSTATIC AGENTS (NO CHARGE) OPTIME
TOPICAL | Status: DC | PRN
  Administered 2023-08-18: 1 via TOPICAL

## 2023-08-18 MED ORDER — FENTANYL CITRATE (PF) 250 MCG/5ML IJ SOLN
INTRAMUSCULAR | Status: DC | PRN
  Administered 2023-08-18: 50 ug via INTRAVENOUS
  Administered 2023-08-18: 100 ug via INTRAVENOUS

## 2023-08-18 MED ORDER — DOCUSATE SODIUM 100 MG PO CAPS
100.0000 mg | ORAL_CAPSULE | Freq: Two times a day (BID) | ORAL | Status: DC
  Administered 2023-08-18 – 2023-08-22 (×8): 100 mg via ORAL
  Filled 2023-08-18 (×8): qty 1

## 2023-08-18 MED ORDER — SODIUM CHLORIDE (PF) 0.9 % IJ SOLN
INTRAMUSCULAR | Status: DC | PRN
  Administered 2023-08-18: 20 mL

## 2023-08-18 MED ORDER — ONDANSETRON HCL 4 MG/2ML IJ SOLN: 4.0000 mg | INTRAMUSCULAR | Status: DC | PRN

## 2023-08-18 MED ORDER — ONDANSETRON HCL 4 MG/2ML IJ SOLN
INTRAMUSCULAR | Status: DC | PRN
  Administered 2023-08-18: 4 mg via INTRAVENOUS

## 2023-08-18 MED ORDER — LIDOCAINE HCL (PF) 2 % IJ SOLN
INTRAMUSCULAR | Status: AC
  Filled 2023-08-18: qty 5

## 2023-08-18 MED ORDER — DOCUSATE SODIUM 100 MG PO CAPS: 100.0000 mg | ORAL_CAPSULE | Freq: Two times a day (BID) | ORAL | Status: DC

## 2023-08-18 MED ORDER — FENTANYL CITRATE PF 50 MCG/ML IJ SOSY
25.0000 ug | PREFILLED_SYRINGE | INTRAMUSCULAR | Status: DC | PRN
  Administered 2023-08-18: 25 ug via INTRAVENOUS
  Administered 2023-08-18: 50 ug via INTRAVENOUS

## 2023-08-18 MED ORDER — PROPOFOL 10 MG/ML IV BOLUS
INTRAVENOUS | Status: DC | PRN
  Administered 2023-08-18: 130 mg via INTRAVENOUS

## 2023-08-18 MED ORDER — BUPIVACAINE LIPOSOME 1.3 % IJ SUSP
INTRAMUSCULAR | Status: AC
  Filled 2023-08-18: qty 20

## 2023-08-18 MED ORDER — LIDOCAINE 2% (20 MG/ML) 5 ML SYRINGE
INTRAMUSCULAR | Status: DC | PRN
  Administered 2023-08-18: 100 mg via INTRAVENOUS
  Administered 2023-08-18: 1.5 mg/kg/h via INTRAVENOUS

## 2023-08-18 MED ORDER — CEFAZOLIN SODIUM-DEXTROSE 2-4 GM/100ML-% IV SOLN
2.0000 g | INTRAVENOUS | Status: AC
  Administered 2023-08-18: 2 g via INTRAVENOUS
  Filled 2023-08-18: qty 100

## 2023-08-18 MED ORDER — PHENYLEPHRINE 80 MCG/ML (10ML) SYRINGE FOR IV PUSH (FOR BLOOD PRESSURE SUPPORT)
PREFILLED_SYRINGE | INTRAVENOUS | Status: DC | PRN
  Administered 2023-08-18: 160 ug via INTRAVENOUS

## 2023-08-18 MED ORDER — ROCURONIUM BROMIDE 10 MG/ML (PF) SYRINGE
PREFILLED_SYRINGE | INTRAVENOUS | Status: AC
  Filled 2023-08-18: qty 10

## 2023-08-18 MED ORDER — ONDANSETRON HCL 4 MG/2ML IJ SOLN
INTRAMUSCULAR | Status: AC
  Filled 2023-08-18: qty 2

## 2023-08-18 MED ORDER — BUPIVACAINE LIPOSOME 1.3 % IJ SUSP
INTRAMUSCULAR | Status: DC | PRN
  Administered 2023-08-18: 20 mL

## 2023-08-18 MED ORDER — CHLORHEXIDINE GLUCONATE 0.12 % MT SOLN
15.0000 mL | Freq: Once | OROMUCOSAL | Status: AC
Start: 2023-08-18 — End: 2023-08-18
  Administered 2023-08-18: 15 mL via OROMUCOSAL

## 2023-08-18 MED ORDER — LACTATED RINGERS IV SOLN: INTRAVENOUS | Status: DC

## 2023-08-18 MED ORDER — PROPOFOL 10 MG/ML IV BOLUS
INTRAVENOUS | Status: AC
  Filled 2023-08-18: qty 20

## 2023-08-18 MED ORDER — DEXAMETHASONE SODIUM PHOSPHATE 10 MG/ML IJ SOLN
INTRAMUSCULAR | Status: AC
  Filled 2023-08-18: qty 1

## 2023-08-18 MED ORDER — PHENYLEPHRINE HCL-NACL 20-0.9 MG/250ML-% IV SOLN
INTRAVENOUS | Status: DC | PRN
  Administered 2023-08-18: 50 ug/min via INTRAVENOUS

## 2023-08-18 MED ORDER — PHENYLEPHRINE 80 MCG/ML (10ML) SYRINGE FOR IV PUSH (FOR BLOOD PRESSURE SUPPORT)
PREFILLED_SYRINGE | INTRAVENOUS | Status: AC
  Filled 2023-08-18: qty 10

## 2023-08-18 MED ORDER — DEXAMETHASONE SODIUM PHOSPHATE 10 MG/ML IJ SOLN
INTRAMUSCULAR | Status: DC | PRN
  Administered 2023-08-18: 10 mg via INTRAVENOUS

## 2023-08-18 MED ORDER — TRAMADOL HCL 50 MG PO TABS: 50.0000 mg | ORAL_TABLET | Freq: Four times a day (QID) | ORAL | 0 refills | Status: DC | PRN

## 2023-08-18 MED ORDER — MIDAZOLAM HCL 2 MG/2ML IJ SOLN
INTRAMUSCULAR | Status: DC | PRN
  Administered 2023-08-18: 2 mg via INTRAVENOUS

## 2023-08-18 MED ORDER — AMLODIPINE BESYLATE 5 MG PO TABS
10.0000 mg | ORAL_TABLET | Freq: Every day | ORAL | Status: DC
  Administered 2023-08-19 – 2023-08-22 (×4): 10 mg via ORAL
  Filled 2023-08-18 (×4): qty 2

## 2023-08-18 MED ORDER — ACETAMINOPHEN 10 MG/ML IV SOLN
1000.0000 mg | Freq: Four times a day (QID) | INTRAVENOUS | Status: AC
  Administered 2023-08-18 – 2023-08-19 (×3): 1000 mg via INTRAVENOUS
  Filled 2023-08-18 (×4): qty 100

## 2023-08-18 MED ORDER — DEXTROSE-SODIUM CHLORIDE 5-0.45 % IV SOLN: INTRAVENOUS | Status: AC

## 2023-08-18 MED ORDER — KETAMINE HCL 50 MG/5ML IJ SOSY
PREFILLED_SYRINGE | INTRAMUSCULAR | Status: AC
  Filled 2023-08-18: qty 5

## 2023-08-18 MED ORDER — MIDAZOLAM HCL 2 MG/2ML IJ SOLN
INTRAMUSCULAR | Status: AC
  Filled 2023-08-18: qty 2

## 2023-08-18 MED ORDER — ROCURONIUM BROMIDE 10 MG/ML (PF) SYRINGE
PREFILLED_SYRINGE | INTRAVENOUS | Status: DC | PRN
  Administered 2023-08-18 (×3): 20 mg via INTRAVENOUS
  Administered 2023-08-18: 60 mg via INTRAVENOUS

## 2023-08-18 MED ORDER — DIPHENHYDRAMINE HCL 12.5 MG/5ML PO ELIX: 12.5000 mg | ORAL_SOLUTION | Freq: Four times a day (QID) | ORAL | Status: DC | PRN

## 2023-08-18 MED ORDER — KETAMINE HCL 10 MG/ML IJ SOLN
INTRAMUSCULAR | Status: DC | PRN
  Administered 2023-08-18: 20 mg via INTRAVENOUS

## 2023-08-18 MED ORDER — CEFAZOLIN SODIUM-DEXTROSE 1-4 GM/50ML-% IV SOLN
1.0000 g | Freq: Three times a day (TID) | INTRAVENOUS | Status: AC
  Administered 2023-08-18 (×2): 1 g via INTRAVENOUS
  Filled 2023-08-18 (×2): qty 50

## 2023-08-18 MED ORDER — ACETAMINOPHEN 500 MG PO TABS
1000.0000 mg | ORAL_TABLET | Freq: Once | ORAL | Status: AC
  Administered 2023-08-18: 1000 mg via ORAL
  Filled 2023-08-18: qty 2

## 2023-08-18 SURGICAL SUPPLY — 58 items
APPLICATOR SURGIFLO ENDO (HEMOSTASIS) IMPLANT
BAG COUNTER SPONGE SURGICOUNT (BAG) IMPLANT
BAG LAPAROSCOPIC 12 15 PORT 16 (BASKET) IMPLANT
BAG RETRIEVAL 12/15 (BASKET) ×1
CHLORAPREP W/TINT 26 (MISCELLANEOUS) ×1 IMPLANT
CLIP LIGATING HEM O LOK PURPLE (MISCELLANEOUS) ×1 IMPLANT
CLIP LIGATING HEMO O LOK GREEN (MISCELLANEOUS) ×2 IMPLANT
COVER SURGICAL LIGHT HANDLE (MISCELLANEOUS) ×1 IMPLANT
COVER TIP SHEARS 8 DVNC (MISCELLANEOUS) ×1 IMPLANT
DERMABOND ADVANCED .7 DNX12 (GAUZE/BANDAGES/DRESSINGS) ×1 IMPLANT
DRAIN CHANNEL 15F RND FF 3/16 (WOUND CARE) ×1 IMPLANT
DRAPE ARM DVNC X/XI (DISPOSABLE) ×4 IMPLANT
DRAPE COLUMN DVNC XI (DISPOSABLE) ×1 IMPLANT
DRAPE INCISE IOBAN 66X45 STRL (DRAPES) ×1 IMPLANT
DRAPE SHEET LG 3/4 BI-LAMINATE (DRAPES) ×1 IMPLANT
DRIVER NDL LRG 8 DVNC XI (INSTRUMENTS) ×2 IMPLANT
DRIVER NDLE LRG 8 DVNC XI (INSTRUMENTS) ×2
DRSG TEGADERM 4X4.75 (GAUZE/BANDAGES/DRESSINGS) IMPLANT
ELECT PENCIL ROCKER SW 15FT (MISCELLANEOUS) ×1 IMPLANT
ELECT REM PT RETURN 15FT ADLT (MISCELLANEOUS) ×1 IMPLANT
EVACUATOR SILICONE 100CC (DRAIN) ×1 IMPLANT
FORCEPS BPLR 8 MD DVNC XI (FORCEP) ×1 IMPLANT
FORCEPS PROGRASP DVNC XI (FORCEP) ×1 IMPLANT
GAUZE 4X4 16PLY ~~LOC~~+RFID DBL (SPONGE) ×1 IMPLANT
GLOVE BIO SURGEON STRL SZ 6.5 (GLOVE) ×1 IMPLANT
GLOVE SURG LX STRL 7.5 STRW (GLOVE) ×2 IMPLANT
GOWN SRG XL LVL 4 BRTHBL STRL (GOWNS) ×1 IMPLANT
GOWN STRL REUS W/ TWL XL LVL3 (GOWN DISPOSABLE) ×2 IMPLANT
HEMOSTAT SURGICEL 4X8 (HEMOSTASIS) IMPLANT
HOLDER FOLEY CATH W/STRAP (MISCELLANEOUS) ×1 IMPLANT
IRRIG SUCT STRYKERFLOW 2 WTIP (MISCELLANEOUS) ×1
IRRIGATION SUCT STRKRFLW 2 WTP (MISCELLANEOUS) ×1 IMPLANT
KIT BASIN OR (CUSTOM PROCEDURE TRAY) ×1 IMPLANT
KIT TURNOVER KIT A (KITS) IMPLANT
PROTECTOR NERVE ULNAR (MISCELLANEOUS) ×2 IMPLANT
SCISSORS MNPLR CVD DVNC XI (INSTRUMENTS) ×1 IMPLANT
SEAL UNIV 5-12 XI (MISCELLANEOUS) ×4 IMPLANT
SET TUBE SMOKE EVAC HIGH FLOW (TUBING) ×1 IMPLANT
SOL ELECTROSURG ANTI STICK (MISCELLANEOUS) ×1
SOLUTION ELECTROSURG ANTI STCK (MISCELLANEOUS) ×1 IMPLANT
SPIKE FLUID TRANSFER (MISCELLANEOUS) ×1 IMPLANT
SURGIFLO W/THROMBIN 8M KIT (HEMOSTASIS) ×1 IMPLANT
SUT ETHILON 3 0 PS 1 (SUTURE) ×1 IMPLANT
SUT MNCRL AB 4-0 PS2 18 (SUTURE) ×2 IMPLANT
SUT PDS PLUS AB 0 CT-2 (SUTURE) ×2 IMPLANT
SUT V-LOC BARB 180 2/0GR6 GS22 (SUTURE) ×1
SUT VIC AB 0 CT1 27XBRD ANTBC (SUTURE) ×1 IMPLANT
SUT VIC AB 3-0 SH 27XBRD (SUTURE) IMPLANT
SUT VLOC BARB 180 ABS3/0GR12 (SUTURE) ×1
SUTURE V-LC BRB 180 2/0GR6GS22 (SUTURE) ×1 IMPLANT
SUTURE VLOC BRB 180 ABS3/0GR12 (SUTURE) ×1 IMPLANT
SYS BAG RETRIEVAL 10MM (BASKET) ×1
SYSTEM BAG RETRIEVAL 10MM (BASKET) ×1 IMPLANT
TOWEL OR 17X26 10 PK STRL BLUE (TOWEL DISPOSABLE) ×1 IMPLANT
TRAY FOLEY MTR SLVR 16FR STAT (SET/KITS/TRAYS/PACK) ×1 IMPLANT
TRAY LAPAROSCOPIC (CUSTOM PROCEDURE TRAY) ×1 IMPLANT
TROCAR Z THREAD OPTICAL 12X100 (TROCAR) ×1 IMPLANT
WATER STERILE IRR 1000ML POUR (IV SOLUTION) ×1 IMPLANT

## 2023-08-18 NOTE — Op Note (Signed)
Preoperative diagnosis: Left renal neoplasm  Postoperative diagnosis: Left renal neoplasm  Procedure:  Left robotic-assisted laparoscopic partial nephrectomy Intraoperative renal ultrasonography  Surgeon: Moody Bruins. M.D.  Assistant(s): Harrie Foreman, PA-C  An assistant was required for this surgical procedure.  The duties of the assistant included but were not limited to suctioning, passing suture, camera manipulation, retraction. This procedure would not be able to be performed without an Geophysicist/field seismologist.  Resident:m Dr. Michaelene Song  Anesthesia: General  Complications: None  EBL: 100 mL  IVF:  1000 mL crystalloid  Specimens: Left renal neoplasm  Disposition of specimens: Pathology  Intraoperative findings:       1. Warm renal ischemia time: 19 minutes       2. Intraoperative renal ultrasound findings: There was a 4.2 cm multiloculated cystic mass off the lower pole of the left kidney.  Drains: # 15 Blake perinephric drain  Indication:  Joshua Curtis is a 72 y.o. year old patient with a left renal neoplasm.  After a thorough review of the management options for their renal mass, they elected to proceed with surgical treatment and the above procedure.  We have discussed the potential benefits and risks of the procedure, side effects of the proposed treatment, the likelihood of the patient achieving the goals of the procedure, and any potential problems that might occur during the procedure or recuperation. Informed consent has been obtained.   Description of procedure:  The patient was taken to the operating room and a general anesthetic was administered. The patient was given preoperative antibiotics, placed in the left modified flank position with care to pad all potential pressure points, and prepped and draped in the usual sterile fashion. Next a preoperative timeout was performed.  A site was selected in the upper midline for initial port placement. This was  placed using a standard open Hassan technique which allowed entry into the peritoneal cavity under direct vision and without difficulty. A 12 mm port was placed and a pneumoperitoneum established. The camera was then used to inspect the abdomen and there was no evidence of any intra-abdominal injuries or other abnormalities. The remaining abdominal ports were then placed. 8 mm robotic ports were placed in the left upper quadrant, left lower quadrant, and far left lateral abdominal wall. A 8 mm port was placed to the left of the midline just off the rectus muscle for the camera.. All ports were placed under direct vision without difficulty. The surgical cart was then docked.   Utilizing the cautery scissors, the white line of Toldt was incised allowing the colon to be mobilized medially and the plane between the mesocolon and the anterior layer of Gerota's fascia to be developed and the kidney to be exposed.  The ureter and gonadal vein were identified inferiorly and the ureter was lifted anteriorly off the psoas muscle.  Dissection proceeded superiorly along the gonadal vein until the renal vein was identified.  The renal hilum was then carefully isolated with a combination of blunt and sharp dissection allowing the renal arterial and venous structures to be separated and isolated in preparation for renal hilar vessel clamping. There were 3 separate renal arteries.   Attention turned to the kidney and the perinephric fat surrounding the renal mass was removed and the kidney was mobilized sufficiently for exposure and resection of the renal mass.   Intraoperative renal ultrasonography was utilized with the laparoscopic ultrasound probe to identify the renal tumor and identify the tumor margins.   Once the  renal mass was properly isolated, preparations were made for resection of the tumor.  Reconstructive sutures were placed into the abdomen for the renorrhaphy portion of the procedure.  All 3 renal arteries  were then clamped with bulldog clamps.  The tumor was then excised with cold scissor dissection along with an adequate visible gross margin of normal renal parenchyma. The tumor appeared to be excised without any gross violation of the tumor. The renal collecting system was entered during removal of the tumor.  A running 3-0 V-lock suture was then brought through the capsule of the kidney and run along the base of the renal defect to provide hemostasis and close any entry into the renal collecting system if present. Weck clips were used to secure this suture outside the renal capsule at the proximal and distal ends. An additional hemostatic agent (Surgiflo) was then placed into the renal defect. A running 2-0 V lock suture was then used to close the renal capsule using a sliding clip technique which resulted in excellent compression of the renal defect.    The bulldog clamps were then removed from the renal hilar vessel(s). Total warm renal ischemia time was 19 minutes. The renal tumor resection site was examined. Hemostasis appeared adequate.   The kidney was placed back into its normal anatomic position and covered with perinephric fat as needed.  A # 15 Blake drain was then brought through the lateral lower port site and positioned in the perinephric space.  It was secured to the skin with a nylon suture. The surgical cart was undocked.  The renal tumor specimen was removed intact within an endopouch retrieval bag via the upper midline port site.  All other laparoscopic/robotic ports had been removed under direct vision and the pneumoperitoneum let down with inspection of the operative field performed and hemostasis again confirmed. The upper midline incision was then closed at the fascial level with 0-vicryl suture. All incision sites were then injected with local anesthetic and reapproximated at the skin level with 4-0 monocryl subcuticular closures.  Liquiband was applied to the skin.  The patient  tolerated the procedure well and without complications.  The patient was able to be extubated and transferred to the recovery unit in satisfactory condition.  Moody Bruins MD

## 2023-08-18 NOTE — Interval H&P Note (Signed)
History and Physical Interval Note:  08/18/2023 7:04 AM  Joshua Curtis  has presented today for surgery, with the diagnosis of LEFT RENAL MASS.  The various methods of treatment have been discussed with the patient and family. After consideration of risks, benefits and other options for treatment, the patient has consented to  Procedure(s) with comments: XI LEFT ROBOTIC ASSISTED LAPAROSCOPIC PARTIAL VERSUS RADICAL NEPHRECTOMY (Left) - 210 MINUTES NEEDED FOR CASE as a surgical intervention.  The patient's history has been reviewed, patient examined, no change in status, stable for surgery.  I have reviewed the patient's chart and labs.  Questions were answered to the patient's satisfaction.     [employer]

## 2023-08-18 NOTE — Progress Notes (Signed)
Post-op note  Subjective: The patient is doing well.  No complaints. Pain controlled, mild dark oozing around JP drainage, reinforced with dressing.   Objective: Vital signs in last 24 hours: Temp:  [97.7 F (36.5 C)-98.3 F (36.8 C)] 97.8 F (36.6 C) (12/16 1339) Pulse Rate:  [72-83] 81 (12/16 1339) Resp:  [11-20] 20 (12/16 1339) BP: (115-141)/(58-76) 139/69 (12/16 1339) SpO2:  [92 %-100 %] 92 % (12/16 1339) Weight:  [75.8 kg] 75.8 kg (12/16 0546)  Intake/Output from previous day: No intake/output data recorded. Intake/Output this shift: Total I/O In: 1200 [I.V.:1100; IV Piggyback:100] Out: 295 [Urine:190; Drains:5; Blood:100]  Physical Exam:  General: Alert and oriented. Abdomen: Soft, Nondistended. Incisions: Clean and dry.  Lab Results: Recent Labs    08/18/23 1050  HGB 13.6  HCT 41.7    Assessment/Plan: POD#0   1) Continue to monitor   Roby Lofts, MD Resident Physician [facility]    LOS: 0 days   Zettie Pho 08/18/2023, 2:37 PM

## 2023-08-18 NOTE — Transfer of Care (Signed)
Immediate Anesthesia Transfer of Care Note  Patient: Joshua Curtis  Procedure(s) Performed: XI LEFT ROBOTIC ASSISTED LAPAROSCOPIC PARTIAL NEPHRECTOMY (Left)  Patient Location: PACU  Anesthesia Type:General  Level of Consciousness: drowsy  Airway & Oxygen Therapy: Patient Spontanous Breathing and Patient connected to face mask oxygen  Post-op Assessment: Report given to RN and Post -op Vital signs reviewed and stable  Post vital signs: Reviewed and stable  Last Vitals:  Vitals Value Taken Time  BP 126/68 08/18/23 1045  Temp    Pulse 83 08/18/23 1045  Resp 16 08/18/23 1045  SpO2 100 % 08/18/23 1045  Vitals shown include unfiled device data.  Last Pain:  Vitals:   08/18/23 0555  TempSrc: Oral  PainSc:          Complications: No notable events documented.

## 2023-08-18 NOTE — Discharge Instructions (Signed)
Activity:  You are encouraged to ambulate frequently (about every hour during waking hours) to help prevent blood clots from forming in your legs or lungs.  However, you should not engage in any heavy lifting (> 10-15 lbs), strenuous activity, or straining. Diet: You should advance your diet as instructed by your physician.  It will be normal to have some bloating, nausea, and abdominal discomfort intermittently. Prescriptions:  You will be provided a prescription for pain medication to take as needed.  If your pain is not severe enough to require the prescription pain medication, you may take extra strength Tylenol instead which will have less side effects.  You should also take a prescribed stool softener to avoid straining with bowel movements as the prescription pain medication may constipate you. Incisions: You may remove your dressing bandages 48 hours after surgery if not removed in the hospital.  You will either have some small staples or special tissue glue at each of the incision sites. Once the bandages are removed (if present), the incisions may stay open to air.  You may start showering (but not soaking or bathing in water) the 2nd day after surgery and the incisions simply need to be patted dry after the shower.  No additional care is needed. What to call us about: You should call the office ([phone]) if you develop fever > 101 or develop persistent vomiting.  

## 2023-08-18 NOTE — Anesthesia Procedure Notes (Signed)
Procedure Name: Intubation Date/Time: 08/18/2023 7:31 AM  Performed by: Florene Route, CRNAPre-anesthesia Checklist: Patient identified, Emergency Drugs available, Suction available and Patient being monitored Patient Re-evaluated:Patient Re-evaluated prior to induction Oxygen Delivery Method: Circle system utilized Preoxygenation: Pre-oxygenation with 100% oxygen Induction Type: IV induction Ventilation: Mask ventilation without difficulty Laryngoscope Size: 3 and Miller Grade View: Grade I Tube type: Oral Tube size: 8.0 mm Number of attempts: 1 Airway Equipment and Method: Stylet and Oral airway Placement Confirmation: ETT inserted through vocal cords under direct vision, positive ETCO2 and breath sounds checked- equal and bilateral Secured at: 23 cm Tube secured with: Tape Dental Injury: Teeth and Oropharynx as per pre-operative assessment

## 2023-08-18 NOTE — Progress Notes (Signed)
Report received from K. O'Berry,RN. No change in assessment. Will continue plan of care. Joshua Curtis, Joshua Flemings, RN

## 2023-08-19 ENCOUNTER — Encounter (HOSPITAL_COMMUNITY): Payer: Self-pay | Admitting: Urology

## 2023-08-19 LAB — BASIC METABOLIC PANEL
Anion gap: 5 (ref 5–15)
BUN: 19 mg/dL (ref 8–23)
CO2: 24 mmol/L (ref 22–32)
Calcium: 8 mg/dL — ABNORMAL LOW (ref 8.9–10.3)
Chloride: 106 mmol/L (ref 98–111)
Creatinine, Ser: 1.65 mg/dL — ABNORMAL HIGH (ref 0.61–1.24)
GFR, Estimated: 44 mL/min — ABNORMAL LOW (ref >60)
Glucose, Bld: 148 mg/dL — ABNORMAL HIGH (ref 70–99)
Potassium: 4.8 mmol/L (ref 3.5–5.1)
Sodium: 135 mmol/L (ref 135–145)

## 2023-08-19 LAB — HEMOGLOBIN AND HEMATOCRIT, BLOOD
HCT: 40.2 % (ref 39.0–52.0)
Hemoglobin: 12.9 g/dL — ABNORMAL LOW (ref 13.0–17.0)

## 2023-08-19 LAB — CREATININE, FLUID (PLEURAL, PERITONEAL, JP DRAINAGE): Creat, Fluid: 1.5 mg/dL

## 2023-08-19 MED ORDER — BISACODYL 10 MG RE SUPP
10.0000 mg | Freq: Once | RECTAL | Status: AC
  Administered 2023-08-19: 10 mg via RECTAL
  Filled 2023-08-19: qty 1

## 2023-08-19 MED ORDER — METOCLOPRAMIDE HCL 5 MG/ML IJ SOLN
5.0000 mg | Freq: Four times a day (QID) | INTRAMUSCULAR | Status: DC
  Administered 2023-08-19 – 2023-08-22 (×11): 5 mg via INTRAVENOUS
  Filled 2023-08-19 (×11): qty 2

## 2023-08-19 MED ORDER — DEXTROSE-SODIUM CHLORIDE 5-0.45 % IV SOLN: INTRAVENOUS | Status: DC

## 2023-08-19 MED ORDER — TRAMADOL HCL 50 MG PO TABS
50.0000 mg | ORAL_TABLET | Freq: Four times a day (QID) | ORAL | Status: DC | PRN
  Administered 2023-08-19 – 2023-08-20 (×3): 50 mg via ORAL
  Filled 2023-08-19 (×3): qty 1

## 2023-08-19 MED ORDER — PHENOL 1.4 % MT LIQD: 1.0000 | OROMUCOSAL | Status: DC | PRN

## 2023-08-19 NOTE — TOC Initial Note (Signed)
Transition of Care [facility]) - Initial/Assessment Note    Patient Details  Name: Joshua Curtis MRN: [id number] Date of Birth: 01-26-1951  Transition of Care [facility]) CM/SW Contact:    Lanier Clam, RN Phone Number: 08/19/2023, 1:06 PM  Clinical Narrative:  d/c plan home.                 Expected Discharge Plan: Home/Self Care Barriers to Discharge: Continued Medical Work up   Patient Goals and CMS Choice            Expected Discharge Plan and Services                                              Prior Living Arrangements/Services                       Activities of Daily Living   ADL Screening (condition at time of admission) Independently performs ADLs?: Yes (appropriate for developmental age) Is the patient deaf or have difficulty hearing?: No Does the patient have difficulty seeing, even when wearing glasses/contacts?: No Does the patient have difficulty concentrating, remembering, or making decisions?: No  Permission Sought/Granted                  Emotional Assessment              Admission diagnosis:  Neoplasm of left kidney [D49.512] Patient Active Problem List   Diagnosis Date Noted   Neoplasm of left kidney 08/18/2023   Other specified disorders of kidney and ureter 03/10/2023   CKD (chronic kidney disease) stage 3, GFR 30-59 ml/min (HCC) 03/10/2023   Acute renal failure superimposed on chronic kidney disease (HCC) 09/30/2022   Iron deficiency anemia 01/25/2022   Allergic rhinitis 12/25/2021   Essential hypertension 12/12/2021   Chronic obstructive pulmonary disease (COPD) (HCC) 12/12/2021   Thrombocytopenia (HCC) 12/12/2021   Hypomagnesemia 12/12/2021   Dieulafoy lesion of duodenum    Acute blood loss anemia    Severe AKI with azotemia    Acute upper GI bleeding    Hemorrhagic shock due to GI bleed likely from [facility] lesion 12/08/2021   Bicuspid aortic valve 11/29/2021   Nausea and vomiting 11/29/2021    Epigastric pain 11/29/2021   Acute pain of both shoulders 09/09/2021   Preventative health care 09/07/2021   Cough 09/07/2021   PAD (peripheral artery disease) (HCC) 04/13/2021   PAOD (peripheral arterial occlusive disease) (HCC) 04/13/2021   Prostate cancer (HCC) 01/08/2021   HLD (hyperlipidemia) 09/06/2020   PVD (peripheral vascular disease) (HCC) 07/18/2020   Acute osteomyelitis (HCC) 05/02/2020   Open wound of right great toe 04/13/2020   Open wound of left great toe 04/13/2020   Cellulitis of toe, left 04/13/2020   Vitamin D deficiency 10/26/2019   Right knee pain 04/21/2019   Aortic atherosclerosis (HCC)    PAH (pulmonary artery hypertension) (HCC)    Coronary artery calcification seen on CAT scan    Thoracic aortic aneurysm (HCC) 10/27/2017   Elevated PSA 10/14/2017   Pulmonary nodule 10/14/2017   Colon polyps 10/14/2017   BPH (benign prostatic hyperplasia) 04/10/2017   Abnormal CT of the abdomen 01/12/2017   Smoker 01/12/2017   Racing heart beat 10/03/2015   Hyperglycemia 03/30/2015   Anemia 03/30/2015   Hypertension 09/25/2012   Increased prostate specific antigen (PSA) velocity 06/22/2011   Encounter  for well adult exam with abnormal findings 06/16/2011   Diverticulosis of colon 11/24/2007   History of colonic polyps 11/24/2007   PCP:  Corwin Levins, MD Pharmacy:   [facility] [phone] [place], [place] - [phone] AT [facility] OF [address] [place] [address] Phone: [phone] Fax: [phone]     Social Drivers of Health (SDOH) Social History: SDOH Screenings   Food Insecurity: No Food Insecurity (08/18/2023)  Housing: Low Risk  (08/18/2023)  Transportation Needs: No Transportation Needs (08/18/2023)  Utilities: Not At Risk (08/18/2023)  Alcohol Screen: Low Risk  (09/03/2022)  Depression (PHQ2-9): Low Risk  (03/10/2023)  Financial Resource Strain: Low Risk  (09/03/2022)  Physical Activity: Sufficiently Active (09/03/2022)   Social Connections: Socially Integrated (09/03/2022)  Stress: No Stress Concern Present (09/03/2022)  Tobacco Use: High Risk (08/18/2023)   SDOH Interventions:     Readmission Risk Interventions    12/10/2021    8:17 AM  Readmission Risk Prevention Plan  Transportation Screening Complete  PCP or Specialist Appt within 3-5 Days Complete  HRI or Home Care Consult Complete

## 2023-08-19 NOTE — TOC Initial Note (Signed)
Transition of Care [facility]) - Initial/Assessment Note    Patient Details  Name: Joshua Curtis MRN: [id number] Date of Birth: [birth year]  Transition of Care [facility]) CM/SW Contact:    Lanier Clam, RN Phone Number: 08/19/2023, 2:35 PM  Clinical Narrative: d/c plan home.                  Expected Discharge Plan: Home/Self Care Barriers to Discharge: Continued Medical Work up   Patient Goals and CMS Choice            Expected Discharge Plan and Services                                              Prior Living Arrangements/Services                       Activities of Daily Living   ADL Screening (condition at time of admission) Independently performs ADLs?: Yes (appropriate for developmental age) Is the patient deaf or have difficulty hearing?: No Does the patient have difficulty seeing, even when wearing glasses/contacts?: No Does the patient have difficulty concentrating, remembering, or making decisions?: No  Permission Sought/Granted                  Emotional Assessment              Admission diagnosis:  Neoplasm of left kidney [D49.512] Patient Active Problem List   Diagnosis Date Noted   Neoplasm of left kidney 08/18/2023   Other specified disorders of kidney and ureter 03/10/2023   CKD (chronic kidney disease) stage 3, GFR 30-59 ml/min (HCC) 03/10/2023   Acute renal failure superimposed on chronic kidney disease (HCC) 09/30/2022   Iron deficiency anemia 01/25/2022   Allergic rhinitis 12/25/2021   Essential hypertension 12/12/2021   Chronic obstructive pulmonary disease (COPD) (HCC) 12/12/2021   Thrombocytopenia (HCC) 12/12/2021   Hypomagnesemia 12/12/2021   Dieulafoy lesion of duodenum    Acute blood loss anemia    Severe AKI with azotemia    Acute upper GI bleeding    Hemorrhagic shock due to GI bleed likely from [facility] lesion 12/08/2021   Bicuspid aortic valve 11/29/2021   Nausea and vomiting 11/29/2021    Epigastric pain 11/29/2021   Acute pain of both shoulders 09/09/2021   Preventative health care 09/07/2021   Cough 09/07/2021   PAD (peripheral artery disease) (HCC) 04/13/2021   PAOD (peripheral arterial occlusive disease) (HCC) 04/13/2021   Prostate cancer (HCC) 01/08/2021   HLD (hyperlipidemia) 09/06/2020   PVD (peripheral vascular disease) (HCC) 07/18/2020   Acute osteomyelitis (HCC) 05/02/2020   Open wound of right great toe 04/13/2020   Open wound of left great toe 04/13/2020   Cellulitis of toe, left 04/13/2020   Vitamin D deficiency 10/26/2019   Right knee pain 04/21/2019   Aortic atherosclerosis (HCC)    PAH (pulmonary artery hypertension) (HCC)    Coronary artery calcification seen on CAT scan    Thoracic aortic aneurysm (HCC) 10/27/2017   Elevated PSA 10/14/2017   Pulmonary nodule 10/14/2017   Colon polyps 10/14/2017   BPH (benign prostatic hyperplasia) 04/10/2017   Abnormal CT of the abdomen 01/12/2017   Smoker 01/12/2017   Racing heart beat 10/03/2015   Hyperglycemia 03/30/2015   Anemia 03/30/2015   Hypertension 09/25/2012   Increased prostate specific antigen (PSA) velocity 06/22/2011   Encounter  for well adult exam with abnormal findings 06/16/2011   Diverticulosis of colon 11/24/2007   History of colonic polyps 11/24/2007   PCP:  Corwin Levins, MD Pharmacy:   [facility] [phone] [place], [place] - [phone] AT [facility] OF [address] [place] [address] Phone: [phone] Fax: [phone]     Social Drivers of Health (SDOH) Social History: SDOH Screenings   Food Insecurity: No Food Insecurity (08/18/2023)  Housing: Low Risk  (08/18/2023)  Transportation Needs: No Transportation Needs (08/18/2023)  Utilities: Not At Risk (08/18/2023)  Alcohol Screen: Low Risk  (09/03/2022)  Depression (PHQ2-9): Low Risk  (03/10/2023)  Financial Resource Strain: Low Risk  (09/03/2022)  Physical Activity: Sufficiently Active (09/03/2022)   Social Connections: Socially Integrated (09/03/2022)  Stress: No Stress Concern Present (09/03/2022)  Tobacco Use: High Risk (08/18/2023)   SDOH Interventions:     Readmission Risk Interventions    12/10/2021    8:17 AM  Readmission Risk Prevention Plan  Transportation Screening Complete  PCP or Specialist Appt within 3-5 Days Complete  HRI or Home Care Consult Complete

## 2023-08-19 NOTE — Progress Notes (Signed)
Patient ID: Joshua Curtis, male   DOB: July 14, 1951, 72 y.o.   MRN: [id number]  Pt doing well but still with distended abdomen.  Will continue to monitor overnight and re-evaluate for discharge in the morning.

## 2023-08-19 NOTE — Plan of Care (Signed)
  Problem: Education: Goal: Knowledge of General Education information will improve Description: Including pain rating scale, medication(s)/side effects and non-pharmacologic comfort measures Outcome: Progressing   Problem: Activity: Goal: Risk for activity intolerance will decrease Outcome: Progressing   Problem: Pain Management: Goal: General experience of comfort will improve Outcome: Progressing   Problem: Bowel/Gastric: Goal: Gastrointestinal status for postoperative course will improve Outcome: Progressing

## 2023-08-19 NOTE — Care Management Obs Status (Signed)
MEDICARE OBSERVATION STATUS NOTIFICATION   Patient Details  Name: ISIDORE FRANC MRN: [id number] Date of Birth: 1951/08/19   Medicare Observation Status Notification Given:  Yes    MahabirOlegario Messier, RN 08/19/2023, 2:34 PM

## 2023-08-19 NOTE — Anesthesia Postprocedure Evaluation (Signed)
Anesthesia Post Note  Patient: Joshua Curtis  Procedure(s) Performed: XI LEFT ROBOTIC ASSISTED LAPAROSCOPIC PARTIAL NEPHRECTOMY (Left)     Patient location during evaluation: PACU Anesthesia Type: General Level of consciousness: awake and alert Pain management: pain level controlled Vital Signs Assessment: post-procedure vital signs reviewed and stable Respiratory status: spontaneous breathing, nonlabored ventilation, respiratory function stable and patient connected to nasal cannula oxygen Cardiovascular status: blood pressure returned to baseline and stable Postop Assessment: no apparent nausea or vomiting Anesthetic complications: no   No notable events documented.  Last Vitals:  Vitals:   08/19/23 1300 08/19/23 2006  BP: (!) 143/90 130/74  Pulse: 93 99  Resp: 20 16  Temp: 37.2 C 37.3 C  SpO2: 97% 92%    Last Pain:  Vitals:   08/19/23 2149  TempSrc:   PainSc: Asleep                 Earl Lites P Krisna Omar

## 2023-08-19 NOTE — Progress Notes (Addendum)
1 Day Post-Op Subjective: The patient is doing well.  No nausea or vomiting. Pain is adequately controlled. Reports passing gas overnight, states abdomen still feels "tight." Complaining on mild throat irritation, likely 2/2 intubation. 135 drain output since surgery.   Objective: Vital signs in last 24 hours: Temp:  [97.7 F (36.5 C)-99.2 F (37.3 C)] 99.2 F (37.3 C) (12/17 0447) Pulse Rate:  [72-89] 86 (12/17 0447) Resp:  [11-20] 17 (12/17 0447) BP: (115-154)/(58-78) 142/77 (12/17 0447) SpO2:  [92 %-100 %] 97 % (12/17 0447)  Intake/Output from previous day: 12/16 0701 - 12/17 0700 In: 3855 [P.O.:240; I.V.:2565; IV Piggyback:1050] Out: 2075 [Urine:1840; Drains:135; Blood:100] Intake/Output this shift: Total I/O In: 2241.7 [P.O.:240; I.V.:1201.7; IV Piggyback:800] Out: 1260 [Urine:1200; Drains:60]  Physical Exam:  General: Alert and oriented. CV: RRR Lungs: Clear bilaterally. GI: Soft, Nondistended. Incisions: Clean and dry. Urine: Clear Extremities: Nontender, no erythema, no edema.  Lab Results: Recent Labs    08/18/23 1050 08/19/23 0507  HGB 13.6 12.9*  HCT 41.7 40.2          Recent Labs    08/12/23 1406 08/18/23 1050 08/19/23 0507  CREATININE 1.35* 1.83* 1.65*           Results for orders placed or performed during the hospital encounter of 08/18/23 (from the past 24 hours)  Basic metabolic panel     Status: Abnormal   Collection Time: 08/18/23 10:50 AM  Result Value Ref Range   Sodium 137 135 - 145 mmol/L   Potassium 4.7 3.5 - 5.1 mmol/L   Chloride 104 98 - 111 mmol/L   CO2 26 22 - 32 mmol/L   Glucose, Bld 150 (H) 70 - 99 mg/dL   BUN 18 8 - 23 mg/dL   Creatinine, Ser 4.13 (H) 0.61 - 1.24 mg/dL   Calcium 8.0 (L) 8.9 - 10.3 mg/dL   GFR, Estimated 39 (L) >60 mL/min   Anion gap 7 5 - 15  Hemoglobin and hematocrit, blood     Status: None   Collection Time: 08/18/23 10:50 AM  Result Value Ref Range   Hemoglobin 13.6 13.0 - 17.0 g/dL   HCT 24.4 01.0  - 27.2 %  Basic metabolic panel     Status: Abnormal   Collection Time: 08/19/23  5:07 AM  Result Value Ref Range   Sodium 135 135 - 145 mmol/L   Potassium 4.8 3.5 - 5.1 mmol/L   Chloride 106 98 - 111 mmol/L   CO2 24 22 - 32 mmol/L   Glucose, Bld 148 (H) 70 - 99 mg/dL   BUN 19 8 - 23 mg/dL   Creatinine, Ser 5.36 (H) 0.61 - 1.24 mg/dL   Calcium 8.0 (L) 8.9 - 10.3 mg/dL   GFR, Estimated 44 (L) >60 mL/min   Anion gap 5 5 - 15  Hemoglobin and hematocrit, blood     Status: Abnormal   Collection Time: 08/19/23  5:07 AM  Result Value Ref Range   Hemoglobin 12.9 (L) 13.0 - 17.0 g/dL   HCT 64.4 03.4 - 74.2 %    Assessment/Plan: POD# 1 s/p robotic partial nephrectomy.  1) Ambulate, Incentive spirometry 2) Ducolax suppository 3) Check JP creatinine 3) Sl IVF 4) D/C home  Joshua Curtis. MD   LOS: 0 days   Joshua Curtis 08/19/2023, 6:46 AM

## 2023-08-19 NOTE — Progress Notes (Signed)
Report received from K. O'Berry RN. No change at this time in Patient's assesment. Maintain plan of care.

## 2023-08-20 ENCOUNTER — Observation Stay (HOSPITAL_COMMUNITY): Payer: PPO

## 2023-08-20 DIAGNOSIS — R140 Abdominal distension (gaseous): Secondary | ICD-10-CM | POA: Diagnosis not present

## 2023-08-20 DIAGNOSIS — R918 Other nonspecific abnormal finding of lung field: Secondary | ICD-10-CM | POA: Diagnosis not present

## 2023-08-20 DIAGNOSIS — Z823 Family history of stroke: Secondary | ICD-10-CM | POA: Diagnosis not present

## 2023-08-20 DIAGNOSIS — K5730 Diverticulosis of large intestine without perforation or abscess without bleeding: Secondary | ICD-10-CM | POA: Diagnosis not present

## 2023-08-20 DIAGNOSIS — D509 Iron deficiency anemia, unspecified: Secondary | ICD-10-CM | POA: Diagnosis not present

## 2023-08-20 DIAGNOSIS — Q2381 Bicuspid aortic valve: Secondary | ICD-10-CM | POA: Diagnosis not present

## 2023-08-20 DIAGNOSIS — Z860100 Personal history of colon polyps, unspecified: Secondary | ICD-10-CM | POA: Diagnosis not present

## 2023-08-20 DIAGNOSIS — Z8249 Family history of ischemic heart disease and other diseases of the circulatory system: Secondary | ICD-10-CM | POA: Diagnosis not present

## 2023-08-20 DIAGNOSIS — N183 Chronic kidney disease, stage 3 unspecified: Secondary | ICD-10-CM | POA: Diagnosis not present

## 2023-08-20 DIAGNOSIS — K9189 Other postprocedural complications and disorders of digestive system: Secondary | ICD-10-CM | POA: Diagnosis not present

## 2023-08-20 DIAGNOSIS — I712 Thoracic aortic aneurysm, without rupture, unspecified: Secondary | ICD-10-CM | POA: Diagnosis not present

## 2023-08-20 DIAGNOSIS — I129 Hypertensive chronic kidney disease with stage 1 through stage 4 chronic kidney disease, or unspecified chronic kidney disease: Secondary | ICD-10-CM | POA: Diagnosis not present

## 2023-08-20 DIAGNOSIS — E785 Hyperlipidemia, unspecified: Secondary | ICD-10-CM | POA: Diagnosis not present

## 2023-08-20 DIAGNOSIS — I2510 Atherosclerotic heart disease of native coronary artery without angina pectoris: Secondary | ICD-10-CM | POA: Diagnosis not present

## 2023-08-20 DIAGNOSIS — D49512 Neoplasm of unspecified behavior of left kidney: Secondary | ICD-10-CM | POA: Diagnosis not present

## 2023-08-20 DIAGNOSIS — K219 Gastro-esophageal reflux disease without esophagitis: Secondary | ICD-10-CM | POA: Diagnosis not present

## 2023-08-20 DIAGNOSIS — J449 Chronic obstructive pulmonary disease, unspecified: Secondary | ICD-10-CM | POA: Diagnosis not present

## 2023-08-20 DIAGNOSIS — Z833 Family history of diabetes mellitus: Secondary | ICD-10-CM | POA: Diagnosis not present

## 2023-08-20 DIAGNOSIS — I2721 Secondary pulmonary arterial hypertension: Secondary | ICD-10-CM | POA: Diagnosis not present

## 2023-08-20 DIAGNOSIS — I739 Peripheral vascular disease, unspecified: Secondary | ICD-10-CM | POA: Diagnosis not present

## 2023-08-20 DIAGNOSIS — Z8546 Personal history of malignant neoplasm of prostate: Secondary | ICD-10-CM | POA: Diagnosis not present

## 2023-08-20 DIAGNOSIS — F17210 Nicotine dependence, cigarettes, uncomplicated: Secondary | ICD-10-CM | POA: Diagnosis not present

## 2023-08-20 DIAGNOSIS — K567 Ileus, unspecified: Secondary | ICD-10-CM | POA: Diagnosis not present

## 2023-08-20 DIAGNOSIS — Z79899 Other long term (current) drug therapy: Secondary | ICD-10-CM | POA: Diagnosis not present

## 2023-08-20 LAB — BASIC METABOLIC PANEL
Anion gap: 9 (ref 5–15)
BUN: 21 mg/dL (ref 8–23)
CO2: 23 mmol/L (ref 22–32)
Calcium: 8.6 mg/dL — ABNORMAL LOW (ref 8.9–10.3)
Chloride: 104 mmol/L (ref 98–111)
Creatinine, Ser: 1.54 mg/dL — ABNORMAL HIGH (ref 0.61–1.24)
GFR, Estimated: 48 mL/min — ABNORMAL LOW (ref >60)
Glucose, Bld: 106 mg/dL — ABNORMAL HIGH (ref 70–99)
Potassium: 4.1 mmol/L (ref 3.5–5.1)
Sodium: 136 mmol/L (ref 135–145)

## 2023-08-20 LAB — HEMOGLOBIN AND HEMATOCRIT, BLOOD
HCT: 39.2 % (ref 39.0–52.0)
Hemoglobin: 13.1 g/dL (ref 13.0–17.0)

## 2023-08-20 LAB — SURGICAL PATHOLOGY

## 2023-08-20 MED ORDER — BISACODYL 10 MG RE SUPP
10.0000 mg | Freq: Once | RECTAL | Status: AC
  Administered 2023-08-20: 10 mg via RECTAL
  Filled 2023-08-20: qty 1

## 2023-08-20 MED ORDER — KCL IN DEXTROSE-NACL 20-5-0.9 MEQ/L-%-% IV SOLN
INTRAVENOUS | Status: DC
  Filled 2023-08-20: qty 1000

## 2023-08-20 MED ORDER — DEXTROSE-SODIUM CHLORIDE 5-0.45 % IV SOLN: INTRAVENOUS | Status: AC

## 2023-08-20 NOTE — Plan of Care (Signed)
Pt slept in the recliner d/t abdominal pressure. Before sleeping, pt took PRN pain med. Pt reported about having small bowel movement this morning. JP drain dressing changed.  Problem: Education: Goal: Knowledge of General Education information will improve Description: Including pain rating scale, medication(s)/side effects and non-pharmacologic comfort measures Outcome: Progressing   Problem: Clinical Measurements: Goal: Will remain free from infection Outcome: Progressing   Problem: Activity: Goal: Risk for activity intolerance will decrease Outcome: Progressing   Problem: Coping: Goal: Level of anxiety will decrease Outcome: Progressing   Problem: Pain Management: Goal: General experience of comfort will improve Outcome: Progressing   Problem: Safety: Goal: Ability to remain free from injury will improve Outcome: Progressing   Problem: Bowel/Gastric: Goal: Gastrointestinal status for postoperative course will improve Outcome: Progressing   Problem: Clinical Measurements: Goal: Postoperative complications will be avoided or minimized Outcome: Progressing

## 2023-08-20 NOTE — Progress Notes (Signed)
Patient ID: Joshua Curtis, male   DOB: Aug 19, 1951, 72 y.o.   MRN: [id number]  2 Days Post-Op Subjective: Pt did not pass flatus or have BM overnight.  Still with uncomfortable abdomen.  Objective: Vital signs in last 24 hours: Temp:  [98.1 F (36.7 C)-99.1 F (37.3 C)] 98.6 F (37 C) (12/18 0356) Pulse Rate:  [93-104] 104 (12/18 0356) Resp:  [15-20] 15 (12/18 0356) BP: (116-149)/(71-90) 116/71 (12/18 0356) SpO2:  [92 %-97 %] 94 % (12/18 0356)  Intake/Output from previous day: 12/17 0701 - 12/18 0700 In: 895 [P.O.:360; I.V.:535] Out: 887 [Urine:777; Drains:110] Intake/Output this shift: No intake/output data recorded.  Physical Exam:  General: Alert and oriented CV: RRR Lungs: Clear Abdomen: Soft, severely distended (worse than last evening), minimal BS Incisions: C/D/I  Lab Results: Recent Labs    08/18/23 1050 08/19/23 0507 08/20/23 0509  HGB 13.6 12.9* 13.1  HCT 41.7 40.2 39.2   BMET Recent Labs    08/19/23 0507 08/20/23 0509  NA 135 136  K 4.8 4.1  CL 106 104  CO2 24 23  GLUCOSE 148* 106*  BUN 19 21  CREATININE 1.65* 1.54*  CALCIUM 8.0* 8.6*     Studies/Results: No results found.  Assessment/Plan: POD # 2 s/p left RAL partial nephrectomy with postoperative ileus - NPO and restart IVF hydration - Will check AAS - D/C drain (Cr c/w serum) - Path pending   LOS: 0 days   Crecencio Mc 08/20/2023, 7:23 AM

## 2023-08-21 LAB — BASIC METABOLIC PANEL
Anion gap: 9 (ref 5–15)
BUN: 20 mg/dL (ref 8–23)
CO2: 23 mmol/L (ref 22–32)
Calcium: 8.4 mg/dL — ABNORMAL LOW (ref 8.9–10.3)
Chloride: 106 mmol/L (ref 98–111)
Creatinine, Ser: 1.38 mg/dL — ABNORMAL HIGH (ref 0.61–1.24)
GFR, Estimated: 55 mL/min — ABNORMAL LOW (ref >60)
Glucose, Bld: 111 mg/dL — ABNORMAL HIGH (ref 70–99)
Potassium: 3.9 mmol/L (ref 3.5–5.1)
Sodium: 138 mmol/L (ref 135–145)

## 2023-08-21 MED ORDER — BISACODYL 10 MG RE SUPP
10.0000 mg | Freq: Once | RECTAL | Status: AC
  Administered 2023-08-21: 10 mg via RECTAL
  Filled 2023-08-21 (×2): qty 1

## 2023-08-21 NOTE — Progress Notes (Signed)
Mobility Specialist - Progress Note   08/21/23 1008  Mobility  Activity Ambulated independently to bathroom  Level of Assistance Independent after set-up  Distance Ambulated (ft) 20 ft  Activity Response Tolerated well  Mobility Referral Yes  Mobility visit 1 Mobility  Mobility Specialist Start Time (ACUTE ONLY) 1005  Mobility Specialist Stop Time (ACUTE ONLY) 1007  Mobility Specialist Time Calculation (min) (ACUTE ONLY) 2 min   Pt received in room requesting assistance to the bathroom. No complaints during session. Instructed pt to pull call bell if needed. Pt to bathroom after session with all needs met.   [facility]

## 2023-08-21 NOTE — Progress Notes (Addendum)
Patient ID: Joshua Curtis, male   DOB: 21-Dec-1950, 72 y.o.   MRN: [id number]  3 Days Post-Op  Subjective: Pt reports passing gas last night, still feels distended but slightly improved.   Objective: Vital signs in last 24 hours: Temp:  [98.3 F (36.8 C)-98.8 F (37.1 C)] 98.8 F (37.1 C) (12/19 0412) Pulse Rate:  [90-99] 99 (12/19 0412) Resp:  [17-18] 17 (12/19 0412) BP: (124-128)/(71-72) 126/72 (12/19 0412) SpO2:  [91 %-94 %] 91 % (12/19 0412)  Intake/Output from previous day: 12/18 0701 - 12/19 0700 In: 1466.3 [P.O.:20; I.V.:1446.3] Out: -  Intake/Output this shift: No intake/output data recorded.  Physical Exam:  General: Alert and oriented CV: RRR Lungs: Clear Abdomen: Soft, severely distended but improved from day prior Incisions: C/D/I  Lab Results: Recent Labs    08/18/23 1050 08/19/23 0507 08/20/23 0509  HGB 13.6 12.9* 13.1  HCT 41.7 40.2 39.2   BMET Recent Labs    08/19/23 0507 08/20/23 0509  NA 135 136  K 4.8 4.1  CL 106 104  CO2 24 23  GLUCOSE 148* 106*  BUN 19 21  CREATININE 1.65* 1.54*  CALCIUM 8.0* 8.6*     Studies/Results: DG ABD ACUTE 2+V W 1V CHEST Result Date: 08/20/2023 CLINICAL DATA:  Abdominal distension, partial left nephrectomy 08/18/2023 EXAM: DG ABDOMEN ACUTE WITH 1 VIEW CHEST COMPARISON:  12/09/2021, 07/02/2023 FINDINGS: Supine and upright frontal views of the abdomen and pelvis as well as an upright frontal view of the chest are obtained. Cardiac silhouette is unremarkable. Linear consolidation along the minor fissure consistent with a right-sided subsegmental atelectasis or scarring. No airspace disease, effusion, or pneumothorax. Radiopaque catheter overlying the left mid abdomen consistent with surgical drain after partial left nephrectomy. Moderate gaseous distension of the large and small bowel most consistent with postoperative ileus. No masses or abnormal calcifications. Trace pneumoperitoneum beneath right hemidiaphragm  consistent with recent surgical procedure. IMPRESSION: 1. Trace pneumoperitoneum beneath the right hemidiaphragm, consistent with recent surgical procedure. 2. Moderate gaseous distension of the large and small bowel consistent with postoperative ileus. 3. Surgical drain overlying the left mid abdomen, consistent with recent partial left nephrectomy. 4. Subsegmental atelectasis or scarring within the right midlung. No acute airspace disease. Electronically Signed   By: Sharlet Salina M.D.   On: 08/20/2023 11:43    Assessment/Plan: POD # 3 s/p left RAL partial nephrectomy with resolving postoperative ileus - Will keep mIVF, will advance slowly to CLD this AM - D/C drain (Cr c/w serum) - Path pending  Roby Lofts, MD Resident Physician [facility]    LOS: 1 day   Joshua Curtis 08/21/2023, 7:07 AM

## 2023-08-21 NOTE — Progress Notes (Signed)
Patient ID: Joshua Curtis, male   DOB: 1951-06-06, 72 y.o.   MRN: [id number]  Pt still with moderate distention.  Softer than yesterday.  Tolerating clears.  Has not yet tried solid food.    Will advance diet and monitor overnight with plans for probable discharge in the AM.  SL IVF.

## 2023-08-21 NOTE — Progress Notes (Signed)
Mobility Specialist - Progress Note   08/21/23 1257  Mobility  Activity Ambulated with assistance in hallway  Level of Assistance Modified independent, requires aide device or extra time  Assistive Device Front wheel walker  Distance Ambulated (ft) 400 ft  Activity Response Tolerated well  Mobility Referral Yes  Mobility visit 1 Mobility  Mobility Specialist Start Time (ACUTE ONLY) 1240  Mobility Specialist Stop Time (ACUTE ONLY) 1257  Mobility Specialist Time Calculation (min) (ACUTE ONLY) 17 min   Pt received in recliner and agreeable to mobility. No complaints during session. Pt to recliner after session with all needs met.    [facility]

## 2023-08-21 NOTE — Plan of Care (Signed)
  Problem: Education: Goal: Knowledge of General Education information will improve Description: Including pain rating scale, medication(s)/side effects and non-pharmacologic comfort measures Outcome: Progressing   Problem: Activity: Goal: Risk for activity intolerance will decrease Outcome: Progressing   Problem: Pain Management: Goal: General experience of comfort will improve Outcome: Progressing   Problem: Safety: Goal: Ability to remain free from injury will improve Outcome: Progressing   Problem: Skin Integrity: Goal: Risk for impaired skin integrity will decrease Outcome: Progressing

## 2023-08-22 NOTE — Plan of Care (Deleted)
Problem: Education: Goal: Knowledge of General Education information will improve Description: Including pain rating scale, medication(s)/side effects and non-pharmacologic comfort measures 08/22/2023 0940 by Westley Foots, RN Outcome: Adequate for Discharge 08/22/2023 0802 by Westley Foots, RN Outcome: Adequate for Discharge   Problem: Health Behavior/Discharge Planning: Goal: Ability to manage health-related needs will improve 08/22/2023 0940 by Westley Foots, RN Outcome: Adequate for Discharge 08/22/2023 0802 by Westley Foots, RN Outcome: Adequate for Discharge   Problem: Clinical Measurements: Goal: Ability to maintain clinical measurements within normal limits will improve 08/22/2023 0940 by Westley Foots, RN Outcome: Adequate for Discharge 08/22/2023 0802 by Westley Foots, RN Outcome: Adequate for Discharge Goal: Will remain free from infection 08/22/2023 0940 by Westley Foots, RN Outcome: Adequate for Discharge 08/22/2023 0802 by Westley Foots, RN Outcome: Adequate for Discharge Goal: Diagnostic test results will improve 08/22/2023 0940 by Westley Foots, RN Outcome: Adequate for Discharge 08/22/2023 0802 by Westley Foots, RN Outcome: Adequate for Discharge Goal: Respiratory complications will improve 08/22/2023 0940 by Westley Foots, RN Outcome: Adequate for Discharge 08/22/2023 0802 by Westley Foots, RN Outcome: Adequate for Discharge Goal: Cardiovascular complication will be avoided 08/22/2023 0940 by Westley Foots, RN Outcome: Adequate for Discharge 08/22/2023 0802 by Westley Foots, RN Outcome: Adequate for Discharge   Problem: Activity: Goal: Risk for activity intolerance will decrease 08/22/2023 0940 by Westley Foots, RN Outcome: Adequate for Discharge 08/22/2023 0802 by Westley Foots, RN Outcome: Adequate for Discharge   Problem:  Nutrition: Goal: Adequate nutrition will be maintained 08/22/2023 0940 by Westley Foots, RN Outcome: Adequate for Discharge 08/22/2023 0802 by Westley Foots, RN Outcome: Adequate for Discharge   Problem: Coping: Goal: Level of anxiety will decrease 08/22/2023 0940 by Westley Foots, RN Outcome: Adequate for Discharge 08/22/2023 0802 by Westley Foots, RN Outcome: Adequate for Discharge   Problem: Elimination: Goal: Will not experience complications related to bowel motility 08/22/2023 0940 by Westley Foots, RN Outcome: Adequate for Discharge 08/22/2023 0802 by Westley Foots, RN Outcome: Adequate for Discharge Goal: Will not experience complications related to urinary retention 08/22/2023 0940 by Westley Foots, RN Outcome: Adequate for Discharge 08/22/2023 0802 by Westley Foots, RN Outcome: Adequate for Discharge   Problem: Pain Management: Goal: General experience of comfort will improve 08/22/2023 0940 by Westley Foots, RN Outcome: Adequate for Discharge 08/22/2023 0802 by Westley Foots, RN Outcome: Adequate for Discharge   Problem: Safety: Goal: Ability to remain free from injury will improve 08/22/2023 0940 by Westley Foots, RN Outcome: Adequate for Discharge 08/22/2023 0802 by Westley Foots, RN Outcome: Adequate for Discharge   Problem: Skin Integrity: Goal: Risk for impaired skin integrity will decrease 08/22/2023 0940 by Westley Foots, RN Outcome: Adequate for Discharge 08/22/2023 0802 by Westley Foots, RN Outcome: Adequate for Discharge   Problem: Education: Goal: Knowledge of the prescribed therapeutic regimen will improve 08/22/2023 0940 by Westley Foots, RN Outcome: Adequate for Discharge 08/22/2023 0802 by Westley Foots, RN Outcome: Adequate for Discharge   Problem: Bowel/Gastric: Goal: Gastrointestinal status for postoperative course will  improve 08/22/2023 0940 by Westley Foots, RN Outcome: Adequate for Discharge 08/22/2023 0802 by Westley Foots, RN Outcome: Adequate for Discharge   Problem: Clinical Measurements: Goal: Postoperative complications will be avoided or minimized 08/22/2023 0940 by Westley Foots, RN Outcome: Adequate for Discharge 08/22/2023 0802 by Westley Foots, RN Outcome: Adequate for  Discharge   Problem: Respiratory: Goal: Ability to achieve and maintain a regular respiratory rate will improve 08/22/2023 0940 by Westley Foots, RN Outcome: Adequate for Discharge 08/22/2023 0802 by Westley Foots, RN Outcome: Adequate for Discharge   Problem: Skin Integrity: Goal: Demonstration of wound healing without infection will improve 08/22/2023 0940 by Westley Foots, RN Outcome: Adequate for Discharge 08/22/2023 0802 by Westley Foots, RN Outcome: Adequate for Discharge   Problem: Urinary Elimination: Goal: Ability to avoid or minimize complications of infection will improve 08/22/2023 0940 by Westley Foots, RN Outcome: Adequate for Discharge 08/22/2023 0802 by Westley Foots, RN Outcome: Adequate for Discharge Goal: Ability to achieve and maintain urine output will improve 08/22/2023 0940 by Westley Foots, RN Outcome: Adequate for Discharge 08/22/2023 0802 by Westley Foots, RN Outcome: Adequate for Discharge

## 2023-08-22 NOTE — Discharge Summary (Signed)
Date of admission: 08/18/2023  Date of discharge: 08/22/2023  Admission diagnosis:  Neoplasm of left kidney [D49.512]   Discharge diagnosis:  Neoplasm of left kidney [D49.512]  Secondary diagnoses:   Active Ambulatory Problems    Diagnosis Date Noted   Diverticulosis of colon 11/24/2007   History of colonic polyps 11/24/2007   Encounter for well adult exam with abnormal findings 06/16/2011   Increased prostate specific antigen (PSA) velocity 06/22/2011   Hypertension 09/25/2012   Hyperglycemia 03/30/2015   Anemia 03/30/2015   Racing heart beat 10/03/2015   Abnormal CT of the abdomen 01/12/2017   Smoker 01/12/2017   BPH (benign prostatic hyperplasia) 04/10/2017   Elevated PSA 10/14/2017   Pulmonary nodule 10/14/2017   Colon polyps 10/14/2017   Thoracic aortic aneurysm (HCC) 10/27/2017   Aortic atherosclerosis (HCC)    PAH (pulmonary artery hypertension) (HCC)    Coronary artery calcification seen on CAT scan    Right knee pain 04/21/2019   Vitamin D deficiency 10/26/2019   Open wound of right great toe 04/13/2020   Open wound of left great toe 04/13/2020   Cellulitis of toe, left 04/13/2020   Acute osteomyelitis (HCC) 05/02/2020   PVD (peripheral vascular disease) (HCC) 07/18/2020   HLD (hyperlipidemia) 09/06/2020   Prostate cancer (HCC) 01/08/2021   PAD (peripheral artery disease) (HCC) 04/13/2021   PAOD (peripheral arterial occlusive disease) (HCC) 04/13/2021   Preventative health care 09/07/2021   Cough 09/07/2021   Acute pain of both shoulders 09/09/2021   Bicuspid aortic valve 11/29/2021   Nausea and vomiting 11/29/2021   Epigastric pain 11/29/2021   Hemorrhagic shock due to GI bleed likely from [facility] lesion 12/08/2021   Severe AKI with azotemia    Acute upper GI bleeding    Dieulafoy lesion of duodenum    Acute blood loss anemia    Essential hypertension 12/12/2021   Chronic obstructive pulmonary disease (COPD) (HCC) 12/12/2021   Thrombocytopenia (HCC)  12/12/2021   Hypomagnesemia 12/12/2021   Allergic rhinitis 12/25/2021   Iron deficiency anemia 01/25/2022   Acute renal failure superimposed on chronic kidney disease (HCC) 09/30/2022   Other specified disorders of kidney and ureter 03/10/2023   CKD (chronic kidney disease) stage 3, GFR 30-59 ml/min (HCC) 03/10/2023   Resolved Ambulatory Problems    Diagnosis Date Noted   Essential hypertension 11/24/2007   Anemia, iron deficiency 04/02/2016   Acute sinus infection 10/16/2018   Prostate CA (HCC) 10/10/2020   Hypokalemia    Hypernatremia 12/12/2021   Past Medical History:  Diagnosis Date   Arthritis    Cancer (HCC)    COLONIC POLYPS, HX OF 11/24/2007   COPD (chronic obstructive pulmonary disease) (HCC)    DIVERTICULOSIS, COLON 11/24/2007   ED (erectile dysfunction)    GERD (gastroesophageal reflux disease)    HYPERTENSION 11/24/2007   Peripheral vascular disease (HCC)      History and Physical: For full details, please see admission history and physical. Briefly, Joshua Curtis is a 72 y.o. year old patient who was admitted with left renal mass.   Hospital Course: Pt admitted and underwent left radical partial nephrectomy on 08/18/2023.  Pt hospital course was complicated by post-operative ileus. His abdomen became distended, with DG abdomen showing ileus-type picture. As such, he was continued with mIVF and NPO until he began passing gas on POD3. At this time, his diet was slowly advanced, and was tolerating regular diet by POD4. His belly was slightly distended but very considerably soft at time of discharge.  His catheter was removed POD1 and he successfully passed TOV.   His pain was controlled on oral medications at time of discharge.  On the day of discharge, the patient was tolerating a regular diet and their pain was well controlled. They were determined to be stable for discharge home.   Pathology will be discussed at time of follow-up.   Laboratory values:  Recent  Labs    08/20/23 0509  HGB 13.1  HCT 39.2   Recent Labs    08/20/23 0509 08/21/23 0818  CREATININE 1.54* 1.38*    Disposition: Home  Discharge medications:  Allergies as of 08/22/2023   No Known Allergies      Medication List     TAKE these medications    amLODipine 10 MG tablet Commonly known as: NORVASC Take 1 tablet (10 mg total) by mouth daily.   docusate sodium 100 MG capsule Commonly known as: COLACE Take 1 capsule (100 mg total) by mouth 2 (two) times daily.   ferrous sulfate 325 (65 FE) MG tablet Take 325 mg by mouth daily with breakfast.   rosuvastatin 20 MG tablet Commonly known as: CRESTOR Take 1 tablet (20 mg total) by mouth daily.   traMADol 50 MG tablet Commonly known as: Ultram Take 1-2 tablets (50-100 mg total) by mouth every 6 (six) hours as needed for moderate pain (pain score 4-6) or severe pain (pain score 7-10).        Followup:   Follow-up Information     Heloise Purpura, MD Follow up on 09/09/2023.   Specialty: Urology Why: at 10:45 Contact information: [address] [place] [address] [phone]

## 2023-08-22 NOTE — Plan of Care (Signed)
  Problem: Education: Goal: Knowledge of General Education information will improve Description: Including pain rating scale, medication(s)/side effects and non-pharmacologic comfort measures Outcome: Adequate for Discharge   Problem: Health Behavior/Discharge Planning: Goal: Ability to manage health-related needs will improve Outcome: Adequate for Discharge   Problem: Clinical Measurements: Goal: Ability to maintain clinical measurements within normal limits will improve Outcome: Adequate for Discharge Goal: Will remain free from infection Outcome: Adequate for Discharge Goal: Diagnostic test results will improve Outcome: Adequate for Discharge Goal: Respiratory complications will improve Outcome: Adequate for Discharge Goal: Cardiovascular complication will be avoided Outcome: Adequate for Discharge   Problem: Activity: Goal: Risk for activity intolerance will decrease Outcome: Adequate for Discharge   Problem: Nutrition: Goal: Adequate nutrition will be maintained Outcome: Adequate for Discharge   Problem: Coping: Goal: Level of anxiety will decrease Outcome: Adequate for Discharge   Problem: Elimination: Goal: Will not experience complications related to bowel motility Outcome: Adequate for Discharge Goal: Will not experience complications related to urinary retention Outcome: Adequate for Discharge   Problem: Pain Management: Goal: General experience of comfort will improve Outcome: Adequate for Discharge   Problem: Safety: Goal: Ability to remain free from injury will improve Outcome: Adequate for Discharge   Problem: Skin Integrity: Goal: Risk for impaired skin integrity will decrease Outcome: Adequate for Discharge   Problem: Education: Goal: Knowledge of the prescribed therapeutic regimen will improve Outcome: Adequate for Discharge   Problem: Bowel/Gastric: Goal: Gastrointestinal status for postoperative course will improve Outcome: Adequate for  Discharge   Problem: Clinical Measurements: Goal: Postoperative complications will be avoided or minimized Outcome: Adequate for Discharge   Problem: Respiratory: Goal: Ability to achieve and maintain a regular respiratory rate will improve Outcome: Adequate for Discharge   Problem: Skin Integrity: Goal: Demonstration of wound healing without infection will improve Outcome: Adequate for Discharge   Problem: Urinary Elimination: Goal: Ability to avoid or minimize complications of infection will improve Outcome: Adequate for Discharge Goal: Ability to achieve and maintain urine output will improve Outcome: Adequate for Discharge

## 2023-08-22 NOTE — TOC Transition Note (Signed)
Transition of Care [facility]) - Discharge Note   Patient Details  Name: Joshua Curtis MRN: [id number] Date of Birth: 1951-03-08  [facility] CM/SW Contact:  Lanier Clam, RN Phone Number: 08/22/2023, 10:24 AM   Clinical Narrative: d/c home no needs or orders.      Final next level of care: Home/Self Care Barriers to Discharge: No Barriers Identified   Patient Goals and CMS Choice            Discharge Placement                       Discharge Plan and Services Additional resources added to the After Visit Summary for                                       Social Drivers of Health (SDOH) Interventions SDOH Screenings   Food Insecurity: No Food Insecurity (08/18/2023)  Housing: Low Risk  (08/18/2023)  Transportation Needs: No Transportation Needs (08/18/2023)  Utilities: Not At Risk (08/18/2023)  Alcohol Screen: Low Risk  (09/03/2022)  Depression (PHQ2-9): Low Risk  (03/10/2023)  Financial Resource Strain: Low Risk  (09/03/2022)  Physical Activity: Sufficiently Active (09/03/2022)  Social Connections: Socially Integrated (09/03/2022)  Stress: No Stress Concern Present (09/03/2022)  Tobacco Use: High Risk (08/18/2023)     Readmission Risk Interventions    12/10/2021    8:17 AM  Readmission Risk Prevention Plan  Transportation Screening Complete  PCP or Specialist Appt within 3-5 Days Complete  HRI or Home Care Consult Complete

## 2023-08-22 NOTE — Plan of Care (Deleted)
Problem: Education: Goal: Knowledge of General Education information will improve Description: Including pain rating scale, medication(s)/side effects and non-pharmacologic comfort measures 08/22/2023 0940 by Westley Foots, RN Outcome: Adequate for Discharge 08/22/2023 0940 by Westley Foots, RN Outcome: Adequate for Discharge 08/22/2023 0802 by Westley Foots, RN Outcome: Adequate for Discharge   Problem: Health Behavior/Discharge Planning: Goal: Ability to manage health-related needs will improve 08/22/2023 0940 by Westley Foots, RN Outcome: Adequate for Discharge 08/22/2023 0940 by Westley Foots, RN Outcome: Adequate for Discharge 08/22/2023 0802 by Westley Foots, RN Outcome: Adequate for Discharge   Problem: Clinical Measurements: Goal: Ability to maintain clinical measurements within normal limits will improve 08/22/2023 0940 by Westley Foots, RN Outcome: Adequate for Discharge 08/22/2023 0940 by Westley Foots, RN Outcome: Adequate for Discharge 08/22/2023 0802 by Westley Foots, RN Outcome: Adequate for Discharge Goal: Will remain free from infection 08/22/2023 0940 by Westley Foots, RN Outcome: Adequate for Discharge 08/22/2023 0940 by Westley Foots, RN Outcome: Adequate for Discharge 08/22/2023 0802 by Westley Foots, RN Outcome: Adequate for Discharge Goal: Diagnostic test results will improve 08/22/2023 0940 by Westley Foots, RN Outcome: Adequate for Discharge 08/22/2023 0940 by Westley Foots, RN Outcome: Adequate for Discharge 08/22/2023 0802 by Westley Foots, RN Outcome: Adequate for Discharge Goal: Respiratory complications will improve 08/22/2023 0940 by Westley Foots, RN Outcome: Adequate for Discharge 08/22/2023 0940 by Westley Foots, RN Outcome: Adequate for Discharge 08/22/2023 0802 by Westley Foots, RN Outcome: Adequate for  Discharge Goal: Cardiovascular complication will be avoided 08/22/2023 0940 by Westley Foots, RN Outcome: Adequate for Discharge 08/22/2023 0940 by Westley Foots, RN Outcome: Adequate for Discharge 08/22/2023 0802 by Westley Foots, RN Outcome: Adequate for Discharge   Problem: Activity: Goal: Risk for activity intolerance will decrease 08/22/2023 0940 by Westley Foots, RN Outcome: Adequate for Discharge 08/22/2023 0940 by Westley Foots, RN Outcome: Adequate for Discharge 08/22/2023 0802 by Westley Foots, RN Outcome: Adequate for Discharge   Problem: Nutrition: Goal: Adequate nutrition will be maintained 08/22/2023 0940 by Westley Foots, RN Outcome: Adequate for Discharge 08/22/2023 0940 by Westley Foots, RN Outcome: Adequate for Discharge 08/22/2023 0802 by Westley Foots, RN Outcome: Adequate for Discharge   Problem: Coping: Goal: Level of anxiety will decrease 08/22/2023 0940 by Westley Foots, RN Outcome: Adequate for Discharge 08/22/2023 0940 by Westley Foots, RN Outcome: Adequate for Discharge 08/22/2023 0802 by Westley Foots, RN Outcome: Adequate for Discharge   Problem: Elimination: Goal: Will not experience complications related to bowel motility 08/22/2023 0940 by Westley Foots, RN Outcome: Adequate for Discharge 08/22/2023 0940 by Westley Foots, RN Outcome: Adequate for Discharge 08/22/2023 0802 by Westley Foots, RN Outcome: Adequate for Discharge Goal: Will not experience complications related to urinary retention 08/22/2023 0940 by Westley Foots, RN Outcome: Adequate for Discharge 08/22/2023 0940 by Westley Foots, RN Outcome: Adequate for Discharge 08/22/2023 0802 by Westley Foots, RN Outcome: Adequate for Discharge   Problem: Pain Management: Goal: General experience of comfort will improve 08/22/2023 0940 by Westley Foots, RN Outcome: Adequate for Discharge 08/22/2023 0940 by Westley Foots, RN Outcome: Adequate for Discharge 08/22/2023 0802 by Westley Foots, RN Outcome: Adequate for Discharge   Problem: Safety: Goal: Ability to remain free from injury will improve 08/22/2023 0940 by Westley Foots, RN Outcome: Adequate for Discharge 08/22/2023 0940 by Westley Foots,  RN Outcome: Adequate for Discharge 08/22/2023 0802 by Westley Foots, RN Outcome: Adequate for Discharge   Problem: Skin Integrity: Goal: Risk for impaired skin integrity will decrease 08/22/2023 0940 by Westley Foots, RN Outcome: Adequate for Discharge 08/22/2023 0940 by Westley Foots, RN Outcome: Adequate for Discharge 08/22/2023 0802 by Westley Foots, RN Outcome: Adequate for Discharge   Problem: Education: Goal: Knowledge of the prescribed therapeutic regimen will improve 08/22/2023 0940 by Westley Foots, RN Outcome: Adequate for Discharge 08/22/2023 0940 by Westley Foots, RN Outcome: Adequate for Discharge 08/22/2023 0802 by Westley Foots, RN Outcome: Adequate for Discharge   Problem: Bowel/Gastric: Goal: Gastrointestinal status for postoperative course will improve 08/22/2023 0940 by Westley Foots, RN Outcome: Adequate for Discharge 08/22/2023 0940 by Westley Foots, RN Outcome: Adequate for Discharge 08/22/2023 0802 by Westley Foots, RN Outcome: Adequate for Discharge   Problem: Clinical Measurements: Goal: Postoperative complications will be avoided or minimized 08/22/2023 0940 by Westley Foots, RN Outcome: Adequate for Discharge 08/22/2023 0940 by Westley Foots, RN Outcome: Adequate for Discharge 08/22/2023 0802 by Westley Foots, RN Outcome: Adequate for Discharge   Problem: Respiratory: Goal: Ability to achieve and maintain a regular respiratory rate will improve 08/22/2023 0940 by Westley Foots, RN Outcome: Adequate for Discharge 08/22/2023 0940 by Westley Foots, RN Outcome: Adequate for Discharge 08/22/2023 0802 by Westley Foots, RN Outcome: Adequate for Discharge   Problem: Skin Integrity: Goal: Demonstration of wound healing without infection will improve 08/22/2023 0940 by Westley Foots, RN Outcome: Adequate for Discharge 08/22/2023 0940 by Westley Foots, RN Outcome: Adequate for Discharge 08/22/2023 0802 by Westley Foots, RN Outcome: Adequate for Discharge   Problem: Urinary Elimination: Goal: Ability to avoid or minimize complications of infection will improve 08/22/2023 0940 by Westley Foots, RN Outcome: Adequate for Discharge 08/22/2023 0940 by Westley Foots, RN Outcome: Adequate for Discharge 08/22/2023 0802 by Westley Foots, RN Outcome: Adequate for Discharge Goal: Ability to achieve and maintain urine output will improve 08/22/2023 0940 by Westley Foots, RN Outcome: Adequate for Discharge 08/22/2023 0940 by Westley Foots, RN Outcome: Adequate for Discharge 08/22/2023 0802 by Westley Foots, RN Outcome: Adequate for Discharge

## 2023-09-05 ENCOUNTER — Ambulatory Visit (INDEPENDENT_AMBULATORY_CARE_PROVIDER_SITE_OTHER): Payer: PPO

## 2023-09-05 VITALS — BP 140/70 | HR 84 | Ht 70.0 in | Wt 169.0 lb

## 2023-09-05 DIAGNOSIS — Z0000 Encounter for general adult medical examination without abnormal findings: Secondary | ICD-10-CM | POA: Diagnosis not present

## 2023-09-05 NOTE — Patient Instructions (Signed)
 Joshua Curtis , Thank you for taking time to come for your Medicare Wellness Visit. I appreciate your ongoing commitment to your health goals. Please review the following plan we discussed and let me know if I can assist you in the future.   Referrals/Orders/Follow-Ups/Clinician Recommendations: You are due for a Flu, Covid and Shingles vaccines.  Remember to inquire about a recommendation for a eye doctor during your visit next week.   Also discuss the shortness of breath that you have been recently having.  It was nice meeting you today.  Keep up the good work.  This is a list of the screening recommended for you and due dates:  Health Maintenance  Topic Date Due   Zoster (Shingles) Vaccine (1 of 2) Never done   Flu Shot  04/03/2023   COVID-19 Vaccine (6 - 2024-25 season) 05/04/2023   Medicare Annual Wellness Visit  09/04/2024   Colon Cancer Screening  01/18/2025   DTaP/Tdap/Td vaccine (3 - Td or Tdap) 09/06/2030   Pneumonia Vaccine  Completed   Hepatitis C Screening  Completed   HPV Vaccine  Aged Out    Advanced directives: (Copy Requested) Please bring a copy of your health care power of attorney and living will to the office to be added to your chart at your convenience.  Next Medicare Annual Wellness Visit scheduled for next year: Yes

## 2023-09-05 NOTE — Progress Notes (Signed)
 Subjective:   Joshua Curtis is a 73 y.o. male who presents for Medicare Annual/Subsequent preventive examination.  Visit Complete: In person    Cardiac Risk Factors include: advanced age (>39men, >51 women);hypertension;male gender;dyslipidemia;Other (see comment), Risk factor comments: PAH, PVD, PAD, CKD, COPD     Objective:    Today's Vitals   09/05/23 0812  BP: (!) 140/70  Pulse: 84  SpO2: 97%  Weight: 169 lb (76.7 kg)  Height: 5' 10 (1.778 m)   Body mass index is 24.25 kg/m.     09/05/2023    8:19 AM 08/18/2023    2:38 PM 08/18/2023    5:45 AM 08/12/2023    1:53 PM 09/03/2022    3:05 PM 12/08/2021    9:41 AM 04/13/2021    8:59 AM  Advanced Directives  Does Patient Have a Medical Advance Directive? Yes No No No No No No  Type of Estate Agent of [place];Living will        Copy of Healthcare Power of Attorney in Chart? No - copy requested        Would patient like information on creating a medical advance directive?  No - Patient declined No - Patient declined  No - Patient declined  No - Patient declined    Current Medications (verified) Outpatient Encounter Medications as of 09/05/2023  Medication Sig   amLODipine  (NORVASC ) 10 MG tablet Take 1 tablet (10 mg total) by mouth daily.   docusate sodium  (COLACE) 100 MG capsule Take 1 capsule (100 mg total) by mouth 2 (two) times daily.   ferrous sulfate  325 (65 FE) MG tablet Take 325 mg by mouth daily with breakfast.   rosuvastatin  (CRESTOR ) 20 MG tablet Take 1 tablet (20 mg total) by mouth daily.   traMADol  (ULTRAM ) 50 MG tablet Take 1-2 tablets (50-100 mg total) by mouth every 6 (six) hours as needed for moderate pain (pain score 4-6) or severe pain (pain score 7-10). (Patient not taking: Reported on 09/05/2023)   No facility-administered encounter medications on file as of 09/05/2023.    Allergies (verified) Patient has no known allergies.   History: Past Medical History:  Diagnosis Date    Anemia, iron  deficiency 04/02/2016   Aortic atherosclerosis (HCC)    Arthritis    rt knee   BPH (benign prostatic hyperplasia) 04/10/2017   Cancer (HCC)    prostate   COLONIC POLYPS, HX OF 11/24/2007   COPD (chronic obstructive pulmonary disease) (HCC)    pt denies this- no respiratory issues of any kind per pt   Coronary artery calcification seen on CAT scan    DIVERTICULOSIS, COLON 11/24/2007   ED (erectile dysfunction)    GERD (gastroesophageal reflux disease)    HLD (hyperlipidemia) 09/06/2020   HYPERTENSION 11/24/2007   Increased prostate specific antigen (PSA) velocity 06/22/2011   PAH (pulmonary artery hypertension) (HCC)    Peripheral vascular disease (HCC)    Past Surgical History:  Procedure Laterality Date   ABDOMINAL AORTOGRAM W/LOWER EXTREMITY N/A 07/04/2020   Procedure: ABDOMINAL AORTOGRAM W/LOWER EXTREMITY;  Surgeon: Serene Gaile ORN, MD;  Location: [facility];  Service: Cardiovascular;  Laterality: N/A;   ABDOMINAL AORTOGRAM W/LOWER EXTREMITY N/A 07/18/2020   Procedure: ABDOMINAL AORTOGRAM W/LOWER EXTREMITY;  Surgeon: Serene Gaile ORN, MD;  Location: [facility];  Service: Cardiovascular;  Laterality: N/A;   ABDOMINAL AORTOGRAM W/LOWER EXTREMITY N/A 04/04/2021   Procedure: ABDOMINAL AORTOGRAM W/LOWER EXTREMITY;  Surgeon: Magda Debby SAILOR, MD;  Location: [facility] INV[facility]IVE CV  LAB;  Service: Cardiovascular;  Laterality: N/A;   BYPASS GRAFT FEMORAL-PERONEAL Right 04/13/2021   Procedure: RIGHT LEG FEMORAL-PERONEAL BYPASS USING PROPATEN GRAFT;  Surgeon: Serene Gaile ORN, MD;  Location: [facility] OR;  Service: Vascular;  Laterality: Right;   COLONOSCOPY     ESOPHAGOGASTRODUODENOSCOPY (EGD) WITH PROPOFOL  N/A 12/09/2021   Procedure: ESOPHAGOGASTRODUODENOSCOPY (EGD) WITH PROPOFOL ;  Surgeon: Rollin Dover, MD;  Location: [facility];  Service: Gastroenterology;  Laterality: N/A;   HOT HEMOSTASIS N/A 12/09/2021   Procedure: HOT HEMOSTASIS (ARGON PLASMA COAGULATION/BICAP);  Surgeon:  Rollin Dover, MD;  Location: [facility];  Service: Gastroenterology;  Laterality: N/A;   PATCH ANGIOPLASTY Right 04/13/2021   Procedure: JILLENE USING VEIN PATCH ANGIOPLASTY;  Surgeon: Serene Gaile ORN, MD;  Location: [facility];  Service: Vascular;  Laterality: Right;   PERIPHERAL VASCULAR BALLOON ANGIOPLASTY Right 07/18/2020   Procedure: PERIPHERAL VASCULAR BALLOON ANGIOPLASTY;  Surgeon: Serene Gaile ORN, MD;  Location: [facility];  Service: Cardiovascular;  Laterality: Right;  tibioperoneal trunk and posterior tibial   PERIPHERAL VASCULAR INTERVENTION  07/04/2020   Procedure: PERIPHERAL VASCULAR INTERVENTION;  Surgeon: Serene Gaile ORN, MD;  Location: [facility];  Service: Cardiovascular;;  Lt. SFA and Popliteal   PERIPHERAL VASCULAR INTERVENTION Right 07/18/2020   Procedure: PERIPHERAL VASCULAR INTERVENTION;  Surgeon: Serene Gaile ORN, MD;  Location: [facility];  Service: Cardiovascular;  Laterality: Right;  Femoral Popliteal   ROBOT ASSISTED LAPAROSCOPIC RADICAL PROSTATECTOMY N/A 01/08/2021   Procedure: XI ROBOTIC ASSISTED LAPAROSCOPIC RADICAL PROSTATECTOMY LEVEL 3;  Surgeon: Renda Glance, MD;  Location: [facility];  Service: Urology;  Laterality: N/A;   ROBOTIC ASSITED PARTIAL NEPHRECTOMY Left 08/18/2023   Procedure: XI LEFT ROBOTIC ASSISTED LAPAROSCOPIC PARTIAL NEPHRECTOMY;  Surgeon: Renda Glance, MD;  Location: [facility];  Service: Urology;  Laterality: Left;  210 MINUTES NEEDED FOR CASE   SCLEROTHERAPY  12/09/2021   Procedure: SCLEROTHERAPY;  Surgeon: Rollin Dover, MD;  Location: [facility];  Service: Gastroenterology;;   Family History  Problem Relation Age of Onset   Stroke Mother    Diabetes Father    Hypertension Sister    Kidney failure Brother        dialysis   Diabetes Paternal Grandfather    Anuerysm Brother    Colon cancer Neg Hx    Esophageal cancer Neg Hx    Rectal cancer Neg Hx    Stomach cancer Neg Hx    Social History   Socioeconomic  History   Marital status: Married    Spouse name: Sonya   Number of children: 2   Years of education: Not on file   Highest education level: Not on file  Occupational History   Occupation: part time hotel  Tobacco Use   Smoking status: Some Days    Current packs/day: 0.00    Types: Cigarettes    Last attempt to quit: 04/22/2021    Years since quitting: 2.3   Smokeless tobacco: Never   Tobacco comments:    tryimg to quit  Vaping Use   Vaping status: Never Used  Substance and Sexual Activity   Alcohol use: Not Currently    Comment: beer rare or mix drink daily.   Drug use: No   Sexual activity: Not Currently  Other Topics Concern   Not on file  Social History Narrative   Lives with wife and daughter and grandson   Social Drivers of Health   Financial Resource Strain: Low Risk  (09/05/2023)   Overall Financial Resource Strain (CARDIA)  Difficulty of Paying Living Expenses: Not hard at all  Food Insecurity: No Food Insecurity (09/05/2023)   Hunger Vital Sign    Worried About Running Out of Food in the Last Year: Never true    Ran Out of Food in the Last Year: Never true  Transportation Needs: No Transportation Needs (09/05/2023)   PRAPARE - Administrator, Civil Service (Medical): No    Lack of Transportation (Non-Medical): No  Physical Activity: Sufficiently Active (09/05/2023)   Exercise Vital Sign    Days of Exercise per Week: 7 days    Minutes of Exercise per Session: 30 min  Stress: No Stress Concern Present (09/05/2023)   [employer] of Occupational Health - Occupational Stress Questionnaire    Feeling of Stress : Not at all  Social Connections: Moderately Integrated (09/05/2023)   Social Connection and Isolation Panel [NHANES]    Frequency of Communication with Friends and Family: More than three times a week    Frequency of Social Gatherings with Friends and Family: Once a week    Attends Religious Services: 1 to 4 times per year    Active Member of  [employer] or Organizations: No    Attends Engineer, Structural: Never    Marital Status: Married    Tobacco Counseling Ready to quit: Not Answered Counseling given: Not Answered Tobacco comments: tryimg to quit   Clinical Intake:  Pre-visit preparation completed: Yes  Pain : No/denies pain     Nutritional Risks: None  How often do you need to have someone help you when you read instructions, pamphlets, or other written materials from your doctor or pharmacy?: 1 - Never     Information entered by :: Crosley Stejskal, RMA   Activities of Daily Living    09/05/2023    8:06 AM 08/18/2023    2:47 PM  In your present state of health, do you have any difficulty performing the following activities:  Hearing? 0   Vision? 0   Difficulty concentrating or making decisions? 0   Walking or climbing stairs? 0   Dressing or bathing? 0   Doing errands, shopping? 0 0  Preparing Food and eating ? N   Using the Toilet? N   In the past six months, have you accidently leaked urine? N   Do you have problems with loss of bowel control? N   Managing your Medications? N   Managing your Finances? N   Housekeeping or managing your Housekeeping? N     Patient Care Team: Norleen Lynwood ORN, MD as PCP - General Pietro, Redell RAMAN, MD as PCP - Cardiology (Cardiology) Gershon Donnice SAUNDERS, DPM as Consulting Physician (Podiatry) Renda Glance, MD as Consulting Physician (Urology) Rollin Dover, MD as Consulting Physician (Gastroenterology)  Indicate any recent Medical Services you may have received from other than [facility] providers in the past year (date may be approximate).     Assessment:   This is a routine wellness examination for [facility].  Hearing/Vision screen Hearing Screening - Comments:: Denies hearing difficulties   Vision Screening - Comments:: Wears eyeglasses   Goals Addressed             This Visit's Progress    My goal for 2025 is to maintain my health and stay healthy.         Depression Screen    09/05/2023    8:24 AM 03/10/2023    9:33 AM 09/30/2022   10:27 AM 09/09/2022   10:05 AM 09/09/2022  9:53 AM 09/03/2022    3:07 PM 03/08/2022   10:48 AM  PHQ 2/9 Scores  PHQ - 2 Score 0 0 0 0 0 0 0  PHQ- 9 Score 0  0    2    Fall Risk    09/05/2023    8:19 AM 03/10/2023    9:32 AM 09/30/2022   10:27 AM 09/09/2022   10:05 AM 09/09/2022    9:53 AM  Fall Risk   Falls in the past year? 0 0 0 0 0  Number falls in past yr: 0 0 0 0   Injury with Fall? 0 0 0 0 0  Risk for fall due to : No Fall Risks No Fall Risks No Fall Risks  No Fall Risks  Follow up Falls prevention discussed;Falls evaluation completed Falls evaluation completed Falls evaluation completed  Falls evaluation completed    MEDICARE RISK AT HOME: Medicare Risk at Home Any stairs in or around the home?: Yes If so, are there any without handrails?: Yes Home free of loose throw rugs in walkways, pet beds, electrical cords, etc?: Yes Adequate lighting in your home to reduce risk of falls?: Yes Life alert?: No Use of a cane, walker or w/c?: No Grab bars in the bathroom?: Yes Shower chair or bench in shower?: Yes Elevated toilet seat or a handicapped toilet?: Yes  TIMED UP AND GO:  Was the test performed?  Yes  Length of time to ambulate 10 feet: 20 sec Gait slow and steady without use of assistive device    Cognitive Function:        09/05/2023    8:07 AM 09/03/2022    3:07 PM  6CIT Screen  What Year? 0 points 0 points  What month? 0 points 0 points  What time? 0 points 0 points  Count back from 20 0 points 0 points  Months in reverse 0 points 0 points  Repeat phrase 0 points 0 points  Total Score 0 points 0 points    Immunizations Immunization History  Administered Date(s) Administered   Fluad Quad(high Dose 65+) 04/21/2019, 09/06/2020, 09/09/2022   Influenza Split 06/21/2011   Influenza, High Dose Seasonal PF 10/03/2015, 10/16/2018, 06/22/2021   Influenza, Seasonal, Injecte, Preservative  Fre 09/25/2012   Influenza,inj,Quad PF,6+ Mos 09/28/2013, 09/29/2014   PFIZER(Purple Top)SARS-COV-2 Vaccination 11/13/2019, 12/08/2019, 07/28/2020   Pfizer Covid-19 Vaccine Bivalent Booster 50yrs & up 06/22/2021   Pfizer(Comirnaty)Fall Seasonal Vaccine 12 years and older 07/11/2022   Pneumococcal Conjugate-13 10/16/2018   Pneumococcal Polysaccharide-23 03/07/2021   Td 03/14/2010   Tdap 09/06/2020    TDAP status: Up to date  Flu Vaccine status: Due, Education has been provided regarding the importance of this vaccine. Advised may receive this vaccine at local pharmacy or Health Dept. Aware to provide a copy of the vaccination record if obtained from local pharmacy or Health Dept. Verbalized acceptance and understanding.  Pneumococcal vaccine status: Up to date  Covid-19 vaccine status: Information provided on how to obtain vaccines.   Qualifies for Shingles Vaccine? Yes   Zostavax completed No   Shingrix Completed?: No.    Education has been provided regarding the importance of this vaccine. Patient has been advised to call insurance company to determine out of pocket expense if they have not yet received this vaccine. Advised may also receive vaccine at local pharmacy or Health Dept. Verbalized acceptance and understanding.  Screening Tests Health Maintenance  Topic Date Due   Zoster Vaccines- Shingrix (1 of 2) Never done  INFLUENZA VACCINE  04/03/2023   COVID-19 Vaccine (6 - 2024-25 season) 05/04/2023   Medicare Annual Wellness (AWV)  09/04/2024   Colonoscopy  01/18/2025   DTaP/Tdap/Td (3 - Td or Tdap) 09/06/2030   Pneumonia Vaccine 8+ Years old  Completed   Hepatitis C Screening  Completed   HPV VACCINES  Aged Out    Health Maintenance  Health Maintenance Due  Topic Date Due   Zoster Vaccines- Shingrix (1 of 2) Never done   INFLUENZA VACCINE  04/03/2023   COVID-19 Vaccine (6 - 2024-25 season) 05/04/2023    Colorectal cancer screening: Type of screening: Colonoscopy.  Completed 01/09/2020. Repeat every 5 years  Lung Cancer Screening: (Low Dose CT Chest recommended if Age 79-80 years, 20 pack-year currently smoking OR have quit w/in 15years.) does not qualify.   Lung Cancer Screening Referral: N/A  Additional Screening:  Hepatitis C Screening: does qualify; Completed 10/03/2015  Vision Screening: Recommended annual ophthalmology exams for early detection of glaucoma and other disorders of the eye. Is the patient up to date with their annual eye exam?  No  Who is the provider or what is the name of the office in which the patient attends annual eye exams? N/A If pt is not established with a provider, would they like to be referred to a provider to establish care? Yes .   Dental Screening: Recommended annual dental exams for proper oral hygiene   Community Resource Referral / Chronic Care Management: CRR required this visit?  No   CCM required this visit?  No     Plan:     I have personally reviewed and noted the following in the patient's chart:   Medical and social history Use of alcohol, tobacco or illicit drugs  Current medications and supplements including opioid prescriptions. Patient is not currently taking opioid prescriptions. Functional ability and status Nutritional status Physical activity Advanced directives List of other physicians Hospitalizations, surgeries, and ER visits in previous 12 months Vitals Screenings to include cognitive, depression, and falls Referrals and appointments  In addition, I have reviewed and discussed with patient certain preventive protocols, quality metrics, and best practice recommendations. A written personalized care plan for preventive services as well as general preventive health recommendations were provided to patient.     Joelee Snoke L Adilen Pavelko, CMA   09/05/2023   After Visit Summary: (MyChart) Due to this being a telephonic visit, the after visit summary with patients personalized plan was  offered to patient via MyChart   Nurse Notes: Patient is due for a Shingrix vaccine and a Covid vaccine.  He is also due for a Flu vaccine and would like to get it during his visit next week with Dr. Norleen.  Patient stated that he would like a referral for an eye doctor, as he is due for an eye exam as well.  He stated that he has felt some SOB at times when he is walking up stairs and would like to discuss that further with Dr. Norleen during his up coming office visit.  Patient had no other concerns to address today.

## 2023-09-09 ENCOUNTER — Other Ambulatory Visit: Payer: Self-pay | Admitting: *Deleted

## 2023-09-09 DIAGNOSIS — I739 Peripheral vascular disease, unspecified: Secondary | ICD-10-CM

## 2023-09-09 DIAGNOSIS — C642 Malignant neoplasm of left kidney, except renal pelvis: Secondary | ICD-10-CM | POA: Diagnosis not present

## 2023-09-09 DIAGNOSIS — I7025 Atherosclerosis of native arteries of other extremities with ulceration: Secondary | ICD-10-CM

## 2023-09-11 ENCOUNTER — Ambulatory Visit (INDEPENDENT_AMBULATORY_CARE_PROVIDER_SITE_OTHER): Payer: PPO | Admitting: Internal Medicine

## 2023-09-11 ENCOUNTER — Encounter: Payer: Self-pay | Admitting: Internal Medicine

## 2023-09-11 VITALS — BP 140/72 | HR 85 | Temp 98.2°F | Ht 70.0 in | Wt 167.0 lb

## 2023-09-11 DIAGNOSIS — R739 Hyperglycemia, unspecified: Secondary | ICD-10-CM | POA: Diagnosis not present

## 2023-09-11 DIAGNOSIS — Z23 Encounter for immunization: Secondary | ICD-10-CM | POA: Diagnosis not present

## 2023-09-11 DIAGNOSIS — N1831 Chronic kidney disease, stage 3a: Secondary | ICD-10-CM

## 2023-09-11 DIAGNOSIS — E559 Vitamin D deficiency, unspecified: Secondary | ICD-10-CM

## 2023-09-11 DIAGNOSIS — E7800 Pure hypercholesterolemia, unspecified: Secondary | ICD-10-CM

## 2023-09-11 DIAGNOSIS — I10 Essential (primary) hypertension: Secondary | ICD-10-CM

## 2023-09-11 DIAGNOSIS — C61 Malignant neoplasm of prostate: Secondary | ICD-10-CM

## 2023-09-11 DIAGNOSIS — D509 Iron deficiency anemia, unspecified: Secondary | ICD-10-CM

## 2023-09-11 DIAGNOSIS — F17200 Nicotine dependence, unspecified, uncomplicated: Secondary | ICD-10-CM | POA: Diagnosis not present

## 2023-09-11 DIAGNOSIS — Z0001 Encounter for general adult medical examination with abnormal findings: Secondary | ICD-10-CM | POA: Diagnosis not present

## 2023-09-11 LAB — CBC WITH DIFFERENTIAL/PLATELET
Basophils Absolute: 0.1 10*3/uL (ref 0.0–0.1)
Basophils Relative: 0.9 % (ref 0.0–3.0)
Eosinophils Absolute: 0.5 10*3/uL (ref 0.0–0.7)
Eosinophils Relative: 7.3 % — ABNORMAL HIGH (ref 0.0–5.0)
HCT: 42.1 % (ref 39.0–52.0)
Hemoglobin: 13.7 g/dL (ref 13.0–17.0)
Lymphocytes Relative: 25.6 % (ref 12.0–46.0)
Lymphs Abs: 1.6 10*3/uL (ref 0.7–4.0)
MCHC: 32.6 g/dL (ref 30.0–36.0)
MCV: 92.4 fL (ref 78.0–100.0)
Monocytes Absolute: 0.6 10*3/uL (ref 0.1–1.0)
Monocytes Relative: 9.1 % (ref 3.0–12.0)
Neutro Abs: 3.6 10*3/uL (ref 1.4–7.7)
Neutrophils Relative %: 57.1 % (ref 43.0–77.0)
Platelets: 273 10*3/uL (ref 150.0–400.0)
RBC: 4.56 Mil/uL (ref 4.22–5.81)
RDW: 15.5 % (ref 11.5–15.5)
WBC: 6.2 10*3/uL (ref 4.0–10.5)

## 2023-09-11 LAB — URINALYSIS, ROUTINE W REFLEX MICROSCOPIC
Bilirubin Urine: NEGATIVE
Hgb urine dipstick: NEGATIVE
Ketones, ur: NEGATIVE
Nitrite: NEGATIVE
RBC / HPF: NONE SEEN (ref 0–?)
Specific Gravity, Urine: 1.015 (ref 1.000–1.030)
Total Protein, Urine: NEGATIVE
Urine Glucose: NEGATIVE
Urobilinogen, UA: 0.2 (ref 0.0–1.0)
pH: 6 (ref 5.0–8.0)

## 2023-09-11 LAB — HEMOGLOBIN A1C: Hgb A1c MFr Bld: 6.5 % (ref 4.6–6.5)

## 2023-09-11 LAB — HEPATIC FUNCTION PANEL
ALT: 24 U/L (ref 0–53)
AST: 31 U/L (ref 0–37)
Albumin: 4.4 g/dL (ref 3.5–5.2)
Alkaline Phosphatase: 51 U/L (ref 39–117)
Bilirubin, Direct: 0.2 mg/dL (ref 0.0–0.3)
Total Bilirubin: 0.7 mg/dL (ref 0.2–1.2)
Total Protein: 7.5 g/dL (ref 6.0–8.3)

## 2023-09-11 LAB — VITAMIN D 25 HYDROXY (VIT D DEFICIENCY, FRACTURES): VITD: 13.43 ng/mL — ABNORMAL LOW (ref 30.00–100.00)

## 2023-09-11 LAB — BASIC METABOLIC PANEL
BUN: 20 mg/dL (ref 6–23)
CO2: 28 meq/L (ref 19–32)
Calcium: 8.8 mg/dL (ref 8.4–10.5)
Chloride: 102 meq/L (ref 96–112)
Creatinine, Ser: 1.39 mg/dL (ref 0.40–1.50)
GFR: 51.38 mL/min — ABNORMAL LOW (ref >60.00)
Glucose, Bld: 97 mg/dL (ref 70–99)
Potassium: 4.4 meq/L (ref 3.5–5.1)
Sodium: 139 meq/L (ref 135–145)

## 2023-09-11 LAB — LIPID PANEL
Cholesterol: 92 mg/dL (ref 0–200)
HDL: 43.8 mg/dL (ref >39.00)
LDL Cholesterol: 26 mg/dL (ref 0–99)
NonHDL: 48.15
Total CHOL/HDL Ratio: 2
Triglycerides: 113 mg/dL (ref 0.0–149.0)
VLDL: 22.6 mg/dL (ref 0.0–40.0)

## 2023-09-11 LAB — TSH: TSH: 1.11 u[IU]/mL (ref 0.35–5.50)

## 2023-09-11 NOTE — Progress Notes (Signed)
 Patient ID: TAI SKELLY, male   DOB: 12-31-1950, 73 y.o.   MRN: [id number]         Chief Complaint:: wellness exam and smoker, low vit d, s/p recent left non malignant renal mass, hx of prostate ca, ckd3a, hld, hyperglycemia, htn, iron  def anemia       HPI:  MAYRA BRAHM is a 73 y.o. male here for wellness exam; for shingrix at pharmacy, declines covid booster, o/w up to date                        Also Pt denies chest pain, increased sob or doe, wheezing, orthopnea, PND, increased LE swelling, palpitations, dizziness or syncope.   Pt denies polydipsia, polyuria, or new focal neuro s/s.    Pt denies fever, wt loss, night sweats, loss of appetite, or other constitutional symptoms Denies urinary symptoms such as dysuria, frequency, urgency, flank pain, hematuria or n/v, fever, chills.  Tolerated recent renal surgury well.  No overt bleeding, bruising recent.  Now smoking only 1-2 cigs every few days, nearly quit.     Wt Readings from Last 3 Encounters:  09/11/23 167 lb (75.8 kg)  09/05/23 169 lb (76.7 kg)  08/18/23 167 lb (75.8 kg)   BP Readings from Last 3 Encounters:  09/11/23 (!) 140/72  09/05/23 (!) 140/70  08/22/23 130/73   Immunization History  Administered Date(s) Administered   Fluad Quad(high Dose 65+) 04/21/2019, 09/06/2020, 09/09/2022   Fluad Trivalent(High Dose 65+) 09/11/2023   Influenza Split 06/21/2011   Influenza, High Dose Seasonal PF 10/03/2015, 10/16/2018, 06/22/2021   Influenza, Seasonal, Injecte, Preservative Fre 09/25/2012   Influenza,inj,Quad PF,6+ Mos 09/28/2013, 09/29/2014   PFIZER(Purple Top)SARS-COV-2 Vaccination 11/13/2019, 12/08/2019, 07/28/2020   Pfizer Covid-19 Vaccine Bivalent Booster 48yrs & up 06/22/2021   Pfizer(Comirnaty)Fall Seasonal Vaccine 12 years and older 07/11/2022   Pneumococcal Conjugate-13 10/16/2018   Pneumococcal Polysaccharide-23 03/07/2021   Td 03/14/2010   Tdap 09/06/2020   Health Maintenance Due  Topic Date Due   Zoster  Vaccines- Shingrix (1 of 2) Never done   COVID-19 Vaccine (6 - 2024-25 season) 05/04/2023      Past Medical History:  Diagnosis Date   Anemia, iron  deficiency 04/02/2016   Aortic atherosclerosis (HCC)    Arthritis    rt knee   BPH (benign prostatic hyperplasia) 04/10/2017   Cancer (HCC)    prostate   COLONIC POLYPS, HX OF 11/24/2007   COPD (chronic obstructive pulmonary disease) (HCC)    pt denies this- no respiratory issues of any kind per pt   Coronary artery calcification seen on CAT scan    DIVERTICULOSIS, COLON 11/24/2007   ED (erectile dysfunction)    GERD (gastroesophageal reflux disease)    HLD (hyperlipidemia) 09/06/2020   HYPERTENSION 11/24/2007   Increased prostate specific antigen (PSA) velocity 06/22/2011   PAH (pulmonary artery hypertension) (HCC)    Peripheral vascular disease (HCC)    Past Surgical History:  Procedure Laterality Date   ABDOMINAL AORTOGRAM W/LOWER EXTREMITY N/A 07/04/2020   Procedure: ABDOMINAL AORTOGRAM W/LOWER EXTREMITY;  Surgeon: Serene Gaile ORN, MD;  Location: [facility];  Service: Cardiovascular;  Laterality: N/A;   ABDOMINAL AORTOGRAM W/LOWER EXTREMITY N/A 07/18/2020   Procedure: ABDOMINAL AORTOGRAM W/LOWER EXTREMITY;  Surgeon: Serene Gaile ORN, MD;  Location: [facility];  Service: Cardiovascular;  Laterality: N/A;   ABDOMINAL AORTOGRAM W/LOWER EXTREMITY N/A 04/04/2021   Procedure: ABDOMINAL AORTOGRAM W/LOWER EXTREMITY;  Surgeon: Magda Debby SAILOR, MD;  Location: [facility];  Service: Cardiovascular;  Laterality: N/A;   BYPASS GRAFT FEMORAL-PERONEAL Right 04/13/2021   Procedure: RIGHT LEG FEMORAL-PERONEAL BYPASS USING PROPATEN GRAFT;  Surgeon: Serene Gaile ORN, MD;  Location: [facility];  Service: Vascular;  Laterality: Right;   COLONOSCOPY     ESOPHAGOGASTRODUODENOSCOPY (EGD) WITH PROPOFOL  N/A 12/09/2021   Procedure: ESOPHAGOGASTRODUODENOSCOPY (EGD) WITH PROPOFOL ;  Surgeon: Rollin Dover, MD;  Location: [facility];  Service:  Gastroenterology;  Laterality: N/A;   HOT HEMOSTASIS N/A 12/09/2021   Procedure: HOT HEMOSTASIS (ARGON PLASMA COAGULATION/BICAP);  Surgeon: Rollin Dover, MD;  Location: [facility];  Service: Gastroenterology;  Laterality: N/A;   PATCH ANGIOPLASTY Right 04/13/2021   Procedure: JILLENE USING VEIN PATCH ANGIOPLASTY;  Surgeon: Serene Gaile ORN, MD;  Location: [facility];  Service: Vascular;  Laterality: Right;   PERIPHERAL VASCULAR BALLOON ANGIOPLASTY Right 07/18/2020   Procedure: PERIPHERAL VASCULAR BALLOON ANGIOPLASTY;  Surgeon: Serene Gaile ORN, MD;  Location: [facility];  Service: Cardiovascular;  Laterality: Right;  tibioperoneal trunk and posterior tibial   PERIPHERAL VASCULAR INTERVENTION  07/04/2020   Procedure: PERIPHERAL VASCULAR INTERVENTION;  Surgeon: Serene Gaile ORN, MD;  [facility]: [facility];  Service: Cardiovascular;;  Lt. SFA and Popliteal   PERIPHERAL VASCULAR INTERVENTION Right 07/18/2020   Procedure: PERIPHERAL VASCULAR INTERVENTION;  Surgeon: Serene Gaile ORN, MD;  Location: [facility];  Service: Cardiovascular;  Laterality: Right;  Femoral Popliteal   ROBOT ASSISTED LAPAROSCOPIC RADICAL PROSTATECTOMY N/A 01/08/2021   Procedure: XI ROBOTIC ASSISTED LAPAROSCOPIC RADICAL PROSTATECTOMY LEVEL 3;  Surgeon: Renda Glance, MD;  Location: [facility];  Service: Urology;  Laterality: N/A;   ROBOTIC ASSITED PARTIAL NEPHRECTOMY Left 08/18/2023   Procedure: XI LEFT ROBOTIC ASSISTED LAPAROSCOPIC PARTIAL NEPHRECTOMY;  Surgeon: Renda Glance, MD;  Location: [facility];  Service: Urology;  Laterality: Left;  210 MINUTES NEEDED FOR CASE   SCLEROTHERAPY  12/09/2021   Procedure: SCLEROTHERAPY;  Surgeon: Rollin Dover, MD;  Location: [facility];  Service: Gastroenterology;;    reports that he has been smoking cigarettes. He has never used smokeless tobacco. He reports that he does not currently use alcohol. He reports that he does not use drugs. family history includes Anuerysm  in his brother; Diabetes in his father and paternal grandfather; Hypertension in his sister; Kidney failure in his brother; Stroke in his mother. No Known Allergies Current Outpatient Medications on File Prior to Visit  Medication Sig Dispense Refill   amLODipine  (NORVASC ) 10 MG tablet Take 1 tablet (10 mg total) by mouth daily. 90 tablet 3   docusate sodium  (COLACE) 100 MG capsule Take 1 capsule (100 mg total) by mouth 2 (two) times daily.     rosuvastatin  (CRESTOR ) 20 MG tablet Take 1 tablet (20 mg total) by mouth daily. 90 tablet 3   ferrous sulfate  325 (65 FE) MG tablet Take 325 mg by mouth daily with breakfast. (Patient not taking: Reported on 09/11/2023)     traMADol  (ULTRAM ) 50 MG tablet Take 1-2 tablets (50-100 mg total) by mouth every 6 (six) hours as needed for moderate pain (pain score 4-6) or severe pain (pain score 7-10). (Patient not taking: Reported on 09/11/2023) 20 tablet 0   No current facility-administered medications on file prior to visit.        ROS:  All others reviewed and negative.  Objective        PE:  BP (!) 140/72 (BP Location: Left Arm, Patient Position: Sitting, Cuff Size: Normal)   Pulse 85   Temp  98.2 F (36.8 C) (Oral)   Ht 5' 10 (1.778 m)   Wt 167 lb (75.8 kg)   SpO2 98%   BMI 23.96 kg/m                 Constitutional: Pt appears in NAD               HENT: Head: NCAT.                Right Ear: External ear normal.                 Left Ear: External ear normal.                Eyes: . Pupils are equal, round, and reactive to light. Conjunctivae and EOM are normal               Nose: without d/c or deformity               Neck: Neck supple. Gross normal ROM               Cardiovascular: Normal rate and regular rhythm.                 Pulmonary/Chest: Effort normal and breath sounds without rales or wheezing.                Abd:  Soft, NT, ND, + BS, no organomegaly               Neurological: Pt is alert. At baseline orientation, motor grossly intact                Skin: Skin is warm. No rashes, no other new lesions, LE edema - none               Psychiatric: Pt behavior is normal without agitation   Micro: none  Cardiac tracings I have personally interpreted today:  none  Pertinent Radiological findings (summarize): none   Lab Results  Component Value Date   WBC 6.2 09/11/2023   HGB 13.7 09/11/2023   HCT 42.1 09/11/2023   PLT 273.0 09/11/2023   GLUCOSE 97 09/11/2023   CHOL 92 09/11/2023   TRIG 113.0 09/11/2023   HDL 43.80 09/11/2023   LDLDIRECT 74.0 04/21/2019   LDLCALC 26 09/11/2023   ALT 24 09/11/2023   AST 31 09/11/2023   NA 139 09/11/2023   K 4.4 09/11/2023   CL 102 09/11/2023   CREATININE 1.39 09/11/2023   BUN 20 09/11/2023   CO2 28 09/11/2023   TSH 1.11 09/11/2023   PSA 0.00 (L) 09/09/2022   INR 1.3 (H) 12/10/2021   HGBA1C 6.5 09/11/2023   MICROALBUR 1.2 09/09/2022   Assessment/Plan:  EUNICE OLDAKER is a 73 y.o. Black or African American [2] male with  has a past medical history of Anemia, iron  deficiency (04/02/2016), Aortic atherosclerosis (HCC), Arthritis, BPH (benign prostatic hyperplasia) (04/10/2017), Cancer (HCC), COLONIC POLYPS, HX OF (11/24/2007), COPD (chronic obstructive pulmonary disease) (HCC), Coronary artery calcification seen on CAT scan, DIVERTICULOSIS, COLON (11/24/2007), ED (erectile dysfunction), GERD (gastroesophageal reflux disease), HLD (hyperlipidemia) (09/06/2020), HYPERTENSION (11/24/2007), Increased prostate specific antigen (PSA) velocity (06/22/2011), PAH (pulmonary artery hypertension) (HCC), and Peripheral vascular disease (HCC).  Prostate cancer [facility]) To f/u urology as planned mar 2025  Iron  deficiency anemia Will have f/u iron  lab, consider oral iron  if normal  Encounter for well adult exam with abnormal findings Age and sex appropriate education and counseling updated with regular exercise  and diet Referrals for preventative services - none needed Immunizations addressed - for shingrix  at pharmacy, and declines covid booster Smoking counseling  - pt counsled to quit, pt not ready but almost nearly committed to abstinence Evidence for depression or other mood disorder - none significant Most recent labs reviewed. I have personally reviewed and have noted: 1) the patient's medical and social history 2) The patient's current medications and supplements 3) The patient's height, weight, and BMI have been recorded in the chart   CKD (chronic kidney disease) stage 3, GFR 30-59 ml/min (HCC) Lab Results  Component Value Date   CREATININE 1.39 09/11/2023   Stable overall, cont to avoid nephrotoxins   HLD (hyperlipidemia) Lab Results  Component Value Date   LDLCALC 26 09/11/2023   Stable, pt to continue current statin crestor  20 mg qd   Hyperglycemia Lab Results  Component Value Date   HGBA1C 6.5 09/11/2023   Stable, pt to continue current medical treatment  - diet, wt control   Hypertension BP Readings from Last 3 Encounters:  09/11/23 (!) 140/72  09/05/23 (!) 140/70  08/22/23 130/73   Uncontrolled, pt states ok at home,  pt to continue medical treatment norvasc  10 every day, declines change today   Smoker Pt counsled to completely quit, pt nearly there and agrees, declines chantix   Vitamin D  deficiency Last vitamin D  Lab Results  Component Value Date   VD25OH 13.43 (L) 09/11/2023   Low, to start oral replacement  Followup: Return in about 6 months (around 03/10/2024).  Lynwood Rush, MD 09/14/2023 7:54 PM [facility] Internal [facility]

## 2023-09-11 NOTE — Patient Instructions (Signed)
 Please have your Shingrix (shingles) shots done at your local pharmacy.  Please continue all other medications as before, and refills have been done if requested.  Please have the pharmacy call with any other refills you may need.  Please continue your efforts at being more active, low cholesterol diet, and weight control.  You are otherwise up to date with prevention measures today.  Please keep your appointments with your specialists as you may have planned  Please go to the LAB at the blood drawing area for the tests to be done  You will be contacted by phone if any changes need to be made immediately.  Otherwise, you will receive a letter about your results with an explanation, but please check with MyChart first.  Please make an Appointment to return in 6 months, or sooner if needed

## 2023-09-11 NOTE — Assessment & Plan Note (Signed)
 To f/u urology as planned mar 2025

## 2023-09-11 NOTE — Progress Notes (Signed)
 The test results show that your current treatment is OK, as the tests are stable.  Please continue the same plan.  There is no other need for change of treatment or further evaluation based on these results, at this time.  thanks

## 2023-09-11 NOTE — Assessment & Plan Note (Signed)
 Will have f/u iron lab, consider oral iron if normal

## 2023-09-14 ENCOUNTER — Encounter: Payer: Self-pay | Admitting: Internal Medicine

## 2023-09-14 NOTE — Assessment & Plan Note (Signed)
 Lab Results  Component Value Date   CREATININE 1.39 09/11/2023   Stable overall, cont to avoid nephrotoxins

## 2023-09-14 NOTE — Assessment & Plan Note (Signed)
 Lab Results  Component Value Date   LDLCALC 26 09/11/2023   Stable, pt to continue current statin crestor 20 mg qd

## 2023-09-14 NOTE — Assessment & Plan Note (Signed)
 Age and sex appropriate education and counseling updated with regular exercise and diet Referrals for preventative services - none needed Immunizations addressed - for shingrix at pharmacy, and declines covid booster Smoking counseling  - pt counsled to quit, pt not ready but almost nearly committed to abstinence Evidence for depression or other mood disorder - none significant Most recent labs reviewed. I have personally reviewed and have noted: 1) the patient's medical and social history 2) The patient's current medications and supplements 3) The patient's height, weight, and BMI have been recorded in the chart

## 2023-09-14 NOTE — Assessment & Plan Note (Signed)
 Lab Results  Component Value Date   HGBA1C 6.5 09/11/2023   Stable, pt to continue current medical treatment  - diet, wt control

## 2023-09-14 NOTE — Assessment & Plan Note (Signed)
 Pt counsled to completely quit, pt nearly there and agrees, declines chantix

## 2023-09-14 NOTE — Assessment & Plan Note (Signed)
 Last vitamin D Lab Results  Component Value Date   VD25OH 13.43 (L) 09/11/2023   Low, to start oral replacement

## 2023-09-14 NOTE — Assessment & Plan Note (Signed)
 BP Readings from Last 3 Encounters:  09/11/23 (!) 140/72  09/05/23 (!) 140/70  08/22/23 130/73   Uncontrolled, pt states ok at home,  pt to continue medical treatment norvasc 10 every day, declines change today

## 2023-09-15 ENCOUNTER — Ambulatory Visit (HOSPITAL_COMMUNITY): Admission: RE | Admit: 2023-09-15 | Payer: PPO | Source: Ambulatory Visit

## 2023-09-15 ENCOUNTER — Ambulatory Visit (HOSPITAL_COMMUNITY): Payer: PPO | Attending: Surgery

## 2023-09-15 ENCOUNTER — Ambulatory Visit: Payer: PPO

## 2023-10-20 ENCOUNTER — Ambulatory Visit (INDEPENDENT_AMBULATORY_CARE_PROVIDER_SITE_OTHER)
Admission: RE | Admit: 2023-10-20 | Discharge: 2023-10-20 | Disposition: A | Discharge status: Home / Self Care | Payer: PPO | Source: Ambulatory Visit | Attending: Surgery | Admitting: Surgery

## 2023-10-20 ENCOUNTER — Ambulatory Visit (HOSPITAL_COMMUNITY)
Admission: RE | Admit: 2023-10-20 | Discharge: 2023-10-20 | Disposition: A | Discharge status: Home / Self Care | Payer: PPO | Source: Ambulatory Visit | Attending: Surgery | Admitting: Surgery

## 2023-10-20 ENCOUNTER — Ambulatory Visit: Payer: PPO | Admitting: Physician Assistant

## 2023-10-20 VITALS — BP 156/75 | HR 81 | Temp 98.6°F | Ht 70.0 in | Wt 171.6 lb

## 2023-10-20 DIAGNOSIS — I7025 Atherosclerosis of native arteries of other extremities with ulceration: Secondary | ICD-10-CM

## 2023-10-20 DIAGNOSIS — I739 Peripheral vascular disease, unspecified: Secondary | ICD-10-CM

## 2023-10-20 LAB — VAS US ABI WITH/WO TBI
Left ABI: 0.88
Right ABI: 0.86

## 2023-10-20 NOTE — Progress Notes (Signed)
Office Note     CC:  follow up Requesting Provider:  Corwin Levins, MD  HPI: Joshua Curtis is a 73 y.o. (1951-06-28) male who presents for surveillance of PAD.  He has history of right iliofemoral endarterectomy with vein patch angioplasty and femoral to peroneal artery composite bypass with PTFE and vein by Dr. Myra Gianotti on 04/13/2021 due to right great toe ulceration.  He also has history of left SFA and popliteal stenting by Dr. Myra Gianotti in November 2021.  He denies any claudication, rest pain, or tissue loss of bilateral lower extremities.  Antiplatelets were discontinued in 2023 due to upper GI bleeding requiring multiple units of red blood cells due to hemorrhagic shock.  He is on a daily statin.  He is a tobacco user.   Past Medical History:  Diagnosis Date   Anemia, iron deficiency 04/02/2016   Aortic atherosclerosis (HCC)    Arthritis    rt knee   BPH (benign prostatic hyperplasia) 04/10/2017   Cancer (HCC)    prostate   COLONIC POLYPS, HX OF 11/24/2007   COPD (chronic obstructive pulmonary disease) (HCC)    pt denies this- no respiratory issues of any kind per pt   Coronary artery calcification seen on CAT scan    DIVERTICULOSIS, COLON 11/24/2007   ED (erectile dysfunction)    GERD (gastroesophageal reflux disease)    HLD (hyperlipidemia) 09/06/2020   HYPERTENSION 11/24/2007   Increased prostate specific antigen (PSA) velocity 06/22/2011   PAH (pulmonary artery hypertension) (HCC)    Peripheral vascular disease (HCC)     Past Surgical History:  Procedure Laterality Date   ABDOMINAL AORTOGRAM W/LOWER EXTREMITY N/A 07/04/2020   Procedure: ABDOMINAL AORTOGRAM W/LOWER EXTREMITY;  Surgeon: Nada Libman, MD;  Location: [facility];  Service: Cardiovascular;  Laterality: N/A;   ABDOMINAL AORTOGRAM W/LOWER EXTREMITY N/A 07/18/2020   Procedure: ABDOMINAL AORTOGRAM W/LOWER EXTREMITY;  Surgeon: Nada Libman, MD;  Location: [facility];  Service: Cardiovascular;   Laterality: N/A;   ABDOMINAL AORTOGRAM W/LOWER EXTREMITY N/A 04/04/2021   Procedure: ABDOMINAL AORTOGRAM W/LOWER EXTREMITY;  Surgeon: Leonie Douglas, MD;  Location: [facility];  Service: Cardiovascular;  Laterality: N/A;   BYPASS GRAFT FEMORAL-PERONEAL Right 04/13/2021   Procedure: RIGHT LEG FEMORAL-PERONEAL BYPASS USING PROPATEN GRAFT;  Surgeon: Nada Libman, MD;  Location: [facility];  Service: Vascular;  Laterality: Right;   COLONOSCOPY     ESOPHAGOGASTRODUODENOSCOPY (EGD) WITH PROPOFOL N/A 12/09/2021   Procedure: ESOPHAGOGASTRODUODENOSCOPY (EGD) WITH PROPOFOL;  Surgeon: Jeani Hawking, MD;  Location: [facility];  Service: Gastroenterology;  Laterality: N/A;   HOT HEMOSTASIS N/A 12/09/2021   Procedure: HOT HEMOSTASIS (ARGON PLASMA COAGULATION/BICAP);  Surgeon: Jeani Hawking, MD;  Location: [facility];  Service: Gastroenterology;  Laterality: N/A;   PATCH ANGIOPLASTY Right 04/13/2021   Procedure: Maud Deed USING VEIN PATCH ANGIOPLASTY;  Surgeon: Nada Libman, MD;  Location: [facility];  Service: Vascular;  Laterality: Right;   PERIPHERAL VASCULAR BALLOON ANGIOPLASTY Right 07/18/2020   Procedure: PERIPHERAL VASCULAR BALLOON ANGIOPLASTY;  Surgeon: Nada Libman, MD;  [facility]: [facility];  Service: Cardiovascular;  Laterality: Right;  tibioperoneal trunk and posterior tibial   PERIPHERAL VASCULAR INTERVENTION  07/04/2020   Procedure: PERIPHERAL VASCULAR INTERVENTION;  Surgeon: Nada Libman, MD;  [facility]: [facility];  Service: Cardiovascular;;  Lt. SFA and Popliteal   PERIPHERAL VASCULAR INTERVENTION Right 07/18/2020   Procedure: PERIPHERAL VASCULAR INTERVENTION;  Surgeon: Nada Libman, MD;  [facility];  Service: Cardiovascular;  Laterality: Right;  Femoral Popliteal   ROBOT ASSISTED LAPAROSCOPIC RADICAL PROSTATECTOMY N/A 01/08/2021   Procedure: XI ROBOTIC ASSISTED LAPAROSCOPIC RADICAL PROSTATECTOMY LEVEL 3;  Surgeon: Heloise Purpura, MD;   Location: [facility];  Service: Urology;  Laterality: N/A;   ROBOTIC ASSITED PARTIAL NEPHRECTOMY Left 08/18/2023   Procedure: XI LEFT ROBOTIC ASSISTED LAPAROSCOPIC PARTIAL NEPHRECTOMY;  Surgeon: Heloise Purpura, MD;  Location: [facility];  Service: Urology;  Laterality: Left;  210 MINUTES NEEDED FOR CASE   SCLEROTHERAPY  12/09/2021   Procedure: SCLEROTHERAPY;  Surgeon: Jeani Hawking, MD;  Location: [facility];  Service: Gastroenterology;;    Social History   Socioeconomic History   Marital status: Married    Spouse name: Joshua Curtis   Number of children: 2   Years of education: Not on file   Highest education level: Not on file  Occupational History   Occupation: part time hotel  Tobacco Use   Smoking status: Some Days    Current packs/day: 0.00    Types: Cigarettes    Last attempt to quit: 04/22/2021    Years since quitting: 2.4   Smokeless tobacco: Never   Tobacco comments:    tryimg to quit  Vaping Use   Vaping status: Never Used  Substance and Sexual Activity   Alcohol use: Not Currently    Comment: beer rare or mix drink daily.   Drug use: No   Sexual activity: Not Currently  Other Topics Concern   Not on file  Social History Narrative   Lives with wife and daughter and grandson   Social Drivers of Health   Financial Resource Strain: Low Risk  (09/05/2023)   Overall Financial Resource Strain (CARDIA)    Difficulty of Paying Living Expenses: Not hard at all  Food Insecurity: No Food Insecurity (09/05/2023)   Hunger Vital Sign    Worried About Running Out of Food in the Last Year: Never true    Ran Out of Food in the Last Year: Never true  Transportation Needs: No Transportation Needs (09/05/2023)   PRAPARE - Administrator, Civil Service (Medical): No    Lack of Transportation (Non-Medical): No  Physical Activity: Sufficiently Active (09/05/2023)   Exercise Vital Sign    Days of Exercise per Week: 7 days    Minutes of Exercise per Session: 30 min  Stress: No Stress  Concern Present (09/05/2023)   [employer] of Occupational Health - Occupational Stress Questionnaire    Feeling of Stress : Not at all  Social Connections: Moderately Integrated (09/05/2023)   Social Connection and Isolation Panel [NHANES]    Frequency of Communication with Friends and Family: More than three times a week    Frequency of Social Gatherings with Friends and Family: Once a week    Attends Religious Services: 1 to 4 times per year    Active Member of [employer] or Organizations: No    Attends Banker Meetings: Never    Marital Status: Married  Catering manager Violence: Not At Risk (09/05/2023)   Humiliation, Afraid, Rape, and Kick questionnaire    Fear of Current or Ex-Partner: No    Emotionally Abused: No    Physically Abused: No    Sexually Abused: No    Family History  Problem Relation Age of Onset   Stroke Mother    Diabetes Father    Hypertension Sister    Kidney failure Brother        dialysis   Diabetes Paternal Grandfather  Anuerysm Brother    Colon cancer Neg Hx    Esophageal cancer Neg Hx    Rectal cancer Neg Hx    Stomach cancer Neg Hx     Current Outpatient Medications  Medication Sig Dispense Refill   amLODipine (NORVASC) 10 MG tablet Take 1 tablet (10 mg total) by mouth daily. 90 tablet 3   docusate sodium (COLACE) 100 MG capsule Take 1 capsule (100 mg total) by mouth 2 (two) times daily.     ferrous sulfate 325 (65 FE) MG tablet Take 325 mg by mouth daily with breakfast. (Patient not taking: Reported on 09/11/2023)     rosuvastatin (CRESTOR) 20 MG tablet Take 1 tablet (20 mg total) by mouth daily. 90 tablet 3   traMADol (ULTRAM) 50 MG tablet Take 1-2 tablets (50-100 mg total) by mouth every 6 (six) hours as needed for moderate pain (pain score 4-6) or severe pain (pain score 7-10). (Patient not taking: Reported on 09/11/2023) 20 tablet 0   No current facility-administered medications for this visit.    No Known Allergies   REVIEW OF  SYSTEMS:   [X]  denotes positive finding, [ ]  denotes negative finding Cardiac  Comments:  Chest pain or chest pressure:    Shortness of breath upon exertion:    Short of breath when lying flat:    Irregular heart rhythm:        Vascular    Pain in calf, thigh, or hip brought on by ambulation:    Pain in feet at night that wakes you up from your sleep:     Blood clot in your veins:    Leg swelling:         Pulmonary    Oxygen at home:    Productive cough:     Wheezing:         Neurologic    Sudden weakness in arms or legs:     Sudden numbness in arms or legs:     Sudden onset of difficulty speaking or slurred speech:    Temporary loss of vision in one eye:     Problems with dizziness:         Gastrointestinal    Blood in stool:     Vomited blood:         Genitourinary    Burning when urinating:     Blood in urine:        Psychiatric    Major depression:         Hematologic    Bleeding problems:    Problems with blood clotting too easily:        Skin    Rashes or ulcers:        Constitutional    Fever or chills:      PHYSICAL EXAMINATION:  Vitals:   10/20/23 1445  BP: (!) 156/75  Pulse: 81  Temp: 98.6 F (37 C)  SpO2: 93%  Weight: 171 lb 9.6 oz (77.8 kg)  Height: 5\' 10"  (1.778 m)    General:  WDWN in NAD; vital signs documented above Gait: Not observed HENT: WNL, normocephalic Pulmonary: normal non-labored breathing , without Rales, rhonchi,  wheezing Cardiac: regular HR Abdomen: soft, NT, no masses Skin: without rashes Vascular Exam/Pulses: Absent pedal pulses; 2+ right femoral pulse; 1+ left femoral pulse Extremities: without ischemic changes, without Gangrene , without cellulitis; without open wounds;  Musculoskeletal: no muscle wasting or atrophy  Neurologic: A&O X 3 Psychiatric:  The pt has Normal affect.  Non-Invasive Vascular Imaging:   Right lower extremity bypass duplex demonstrates low flow velocity in the distal bypass and distal  anastomosis  ABI/TBIToday's ABIToday's TBIPrevious ABIPrevious TBI  +-------+-----------+-----------+------------+------------+  Right 0.86       0.49       1.16        0.54          +-------+-----------+-----------+------------+------------+  Left  0.88       0.46       0.94        0.37          +-------+-----------+-----------+------------+------------+     ASSESSMENT/PLAN:: 73 y.o. male here for surveillance of PAD with history of right iliofemoral endarterectomy with femoral to peroneal composite bypass  Joshua Curtis is a 73 year old male with history of right iliofemoral endarterectomy with femoral to peroneal composite bypass performed due to tissue loss of the right foot.  He is without claudication, rest pain, or tissue loss of bilateral lower extremities.  On duplex he of the right leg bypass he has low flow velocities in the distal bypass and distal anastomosis which may threaten the patency of the bypass graft.  He has also experienced a drop in right ABI and TBI since last office visit.  Based on duplex findings he was offered aortogram with right leg runoff and possible angioplasty of the distal bypass, distal anastomosis, and potentially tibial outflow.  Past medical history also significant for recent partial nephrectomy due to kidney neoplasm.  Postoperative lab work demonstrates normal kidney function.  He also has history of upper GI bleed about 2 years ago requiring transfusion for hemorrhagic shock.  All antiplatelet medication were discontinued at this time and has not been restarted.  He has not seen GI in follow-up since that time.  He is aware he would require antiplatelet therapy if we were to intervene endovascularly on right leg bypass.  Case we discussed with Dr. Myra Gianotti.  He will tentatively be scheduled in the next month for the above procedure.  Emilie Rutter, PA-C [facility] [phone]  Clinic MD:   [facility] Spore on call

## 2023-10-20 NOTE — H&P (View-Only) (Signed)
 Office Note     CC:  follow up Requesting Provider:  Corwin Levins, MD  HPI: Joshua Curtis is a 73 y.o. (1951-06-28) male who presents for surveillance of PAD.  He has history of right iliofemoral endarterectomy with vein patch angioplasty and femoral to peroneal artery composite bypass with PTFE and vein by Dr. Myra Gianotti on 04/13/2021 due to right great toe ulceration.  He also has history of left SFA and popliteal stenting by Dr. Myra Gianotti in November 2021.  He denies any claudication, rest pain, or tissue loss of bilateral lower extremities.  Antiplatelets were discontinued in 2023 due to upper GI bleeding requiring multiple units of red blood cells due to hemorrhagic shock.  He is on a daily statin.  He is a tobacco user.   Past Medical History:  Diagnosis Date   Anemia, iron deficiency 04/02/2016   Aortic atherosclerosis (HCC)    Arthritis    rt knee   BPH (benign prostatic hyperplasia) 04/10/2017   Cancer (HCC)    prostate   COLONIC POLYPS, HX OF 11/24/2007   COPD (chronic obstructive pulmonary disease) (HCC)    pt denies this- no respiratory issues of any kind per pt   Coronary artery calcification seen on CAT scan    DIVERTICULOSIS, COLON 11/24/2007   ED (erectile dysfunction)    GERD (gastroesophageal reflux disease)    HLD (hyperlipidemia) 09/06/2020   HYPERTENSION 11/24/2007   Increased prostate specific antigen (PSA) velocity 06/22/2011   PAH (pulmonary artery hypertension) (HCC)    Peripheral vascular disease (HCC)     Past Surgical History:  Procedure Laterality Date   ABDOMINAL AORTOGRAM W/LOWER EXTREMITY N/A 07/04/2020   Procedure: ABDOMINAL AORTOGRAM W/LOWER EXTREMITY;  Surgeon: Nada Libman, MD;  Location: [facility];  Service: Cardiovascular;  Laterality: N/A;   ABDOMINAL AORTOGRAM W/LOWER EXTREMITY N/A 07/18/2020   Procedure: ABDOMINAL AORTOGRAM W/LOWER EXTREMITY;  Surgeon: Nada Libman, MD;  Location: [facility];  Service: Cardiovascular;   Laterality: N/A;   ABDOMINAL AORTOGRAM W/LOWER EXTREMITY N/A 04/04/2021   Procedure: ABDOMINAL AORTOGRAM W/LOWER EXTREMITY;  Surgeon: Leonie Douglas, MD;  Location: [facility];  Service: Cardiovascular;  Laterality: N/A;   BYPASS GRAFT FEMORAL-PERONEAL Right 04/13/2021   Procedure: RIGHT LEG FEMORAL-PERONEAL BYPASS USING PROPATEN GRAFT;  Surgeon: Nada Libman, MD;  Location: [facility];  Service: Vascular;  Laterality: Right;   COLONOSCOPY     ESOPHAGOGASTRODUODENOSCOPY (EGD) WITH PROPOFOL N/A 12/09/2021   Procedure: ESOPHAGOGASTRODUODENOSCOPY (EGD) WITH PROPOFOL;  Surgeon: Jeani Hawking, MD;  Location: [facility];  Service: Gastroenterology;  Laterality: N/A;   HOT HEMOSTASIS N/A 12/09/2021   Procedure: HOT HEMOSTASIS (ARGON PLASMA COAGULATION/BICAP);  Surgeon: Jeani Hawking, MD;  Location: [facility];  Service: Gastroenterology;  Laterality: N/A;   PATCH ANGIOPLASTY Right 04/13/2021   Procedure: Maud Deed USING VEIN PATCH ANGIOPLASTY;  Surgeon: Nada Libman, MD;  Location: [facility];  Service: Vascular;  Laterality: Right;   PERIPHERAL VASCULAR BALLOON ANGIOPLASTY Right 07/18/2020   Procedure: PERIPHERAL VASCULAR BALLOON ANGIOPLASTY;  Surgeon: Nada Libman, MD;  Location: [facility];  Service: Cardiovascular;  Laterality: Right;  tibioperoneal trunk and posterior tibial   PERIPHERAL VASCULAR INTERVENTION  07/04/2020   Procedure: PERIPHERAL VASCULAR INTERVENTION;  Surgeon: Nada Libman, MD;  [facility]: [facility];  Service: Cardiovascular;;  Lt. SFA and Popliteal   PERIPHERAL VASCULAR INTERVENTION Right 07/18/2020   Procedure: PERIPHERAL VASCULAR INTERVENTION;  Surgeon: Nada Libman, MD;  [facility]: [facility];  Service: Cardiovascular;  Laterality: Right;  Femoral Popliteal   ROBOT ASSISTED LAPAROSCOPIC RADICAL PROSTATECTOMY N/A 01/08/2021   Procedure: XI ROBOTIC ASSISTED LAPAROSCOPIC RADICAL PROSTATECTOMY LEVEL 3;  Surgeon: Heloise Purpura, MD;   Location: [facility];  Service: Urology;  Laterality: N/A;   ROBOTIC ASSITED PARTIAL NEPHRECTOMY Left 08/18/2023   Procedure: XI LEFT ROBOTIC ASSISTED LAPAROSCOPIC PARTIAL NEPHRECTOMY;  Surgeon: Heloise Purpura, MD;  Location: [facility];  Service: Urology;  Laterality: Left;  210 MINUTES NEEDED FOR CASE   SCLEROTHERAPY  12/09/2021   Procedure: SCLEROTHERAPY;  Surgeon: Jeani Hawking, MD;  Location: [facility];  Service: Gastroenterology;;    Social History   Socioeconomic History   Marital status: Married    Spouse name: Sonya   Number of children: 2   Years of education: Not on file   Highest education level: Not on file  Occupational History   Occupation: part time hotel  Tobacco Use   Smoking status: Some Days    Current packs/day: 0.00    Types: Cigarettes    Last attempt to quit: 04/22/2021    Years since quitting: 2.4   Smokeless tobacco: Never   Tobacco comments:    tryimg to quit  Vaping Use   Vaping status: Never Used  Substance and Sexual Activity   Alcohol use: Not Currently    Comment: beer rare or mix drink daily.   Drug use: No   Sexual activity: Not Currently  Other Topics Concern   Not on file  Social History Narrative   Lives with wife and daughter and grandson   Social Drivers of Health   Financial Resource Strain: Low Risk  (09/05/2023)   Overall Financial Resource Strain (CARDIA)    Difficulty of Paying Living Expenses: Not hard at all  Food Insecurity: No Food Insecurity (09/05/2023)   Hunger Vital Sign    Worried About Running Out of Food in the Last Year: Never true    Ran Out of Food in the Last Year: Never true  Transportation Needs: No Transportation Needs (09/05/2023)   PRAPARE - Administrator, Civil Service (Medical): No    Lack of Transportation (Non-Medical): No  Physical Activity: Sufficiently Active (09/05/2023)   Exercise Vital Sign    Days of Exercise per Week: 7 days    Minutes of Exercise per Session: 30 min  Stress: No Stress  Concern Present (09/05/2023)   [employer] of Occupational Health - Occupational Stress Questionnaire    Feeling of Stress : Not at all  Social Connections: Moderately Integrated (09/05/2023)   Social Connection and Isolation Panel [NHANES]    Frequency of Communication with Friends and Family: More than three times a week    Frequency of Social Gatherings with Friends and Family: Once a week    Attends Religious Services: 1 to 4 times per year    Active Member of [employer] or Organizations: No    Attends Banker Meetings: Never    Marital Status: Married  Catering manager Violence: Not At Risk (09/05/2023)   Humiliation, Afraid, Rape, and Kick questionnaire    Fear of Current or Ex-Partner: No    Emotionally Abused: No    Physically Abused: No    Sexually Abused: No    Family History  Problem Relation Age of Onset   Stroke Mother    Diabetes Father    Hypertension Sister    Kidney failure Brother        dialysis   Diabetes Paternal Grandfather  Anuerysm Brother    Colon cancer Neg Hx    Esophageal cancer Neg Hx    Rectal cancer Neg Hx    Stomach cancer Neg Hx     Current Outpatient Medications  Medication Sig Dispense Refill   amLODipine (NORVASC) 10 MG tablet Take 1 tablet (10 mg total) by mouth daily. 90 tablet 3   docusate sodium (COLACE) 100 MG capsule Take 1 capsule (100 mg total) by mouth 2 (two) times daily.     ferrous sulfate 325 (65 FE) MG tablet Take 325 mg by mouth daily with breakfast. (Patient not taking: Reported on 09/11/2023)     rosuvastatin (CRESTOR) 20 MG tablet Take 1 tablet (20 mg total) by mouth daily. 90 tablet 3   traMADol (ULTRAM) 50 MG tablet Take 1-2 tablets (50-100 mg total) by mouth every 6 (six) hours as needed for moderate pain (pain score 4-6) or severe pain (pain score 7-10). (Patient not taking: Reported on 09/11/2023) 20 tablet 0   No current facility-administered medications for this visit.    No Known Allergies   REVIEW OF  SYSTEMS:   [X]  denotes positive finding, [ ]  denotes negative finding Cardiac  Comments:  Chest pain or chest pressure:    Shortness of breath upon exertion:    Short of breath when lying flat:    Irregular heart rhythm:        Vascular    Pain in calf, thigh, or hip brought on by ambulation:    Pain in feet at night that wakes you up from your sleep:     Blood clot in your veins:    Leg swelling:         Pulmonary    Oxygen at home:    Productive cough:     Wheezing:         Neurologic    Sudden weakness in arms or legs:     Sudden numbness in arms or legs:     Sudden onset of difficulty speaking or slurred speech:    Temporary loss of vision in one eye:     Problems with dizziness:         Gastrointestinal    Blood in stool:     Vomited blood:         Genitourinary    Burning when urinating:     Blood in urine:        Psychiatric    Major depression:         Hematologic    Bleeding problems:    Problems with blood clotting too easily:        Skin    Rashes or ulcers:        Constitutional    Fever or chills:      PHYSICAL EXAMINATION:  Vitals:   10/20/23 1445  BP: (!) 156/75  Pulse: 81  Temp: 98.6 F (37 C)  SpO2: 93%  Weight: 171 lb 9.6 oz (77.8 kg)  Height: 5\' 10"  (1.778 m)    General:  WDWN in NAD; vital signs documented above Gait: Not observed HENT: WNL, normocephalic Pulmonary: normal non-labored breathing , without Rales, rhonchi,  wheezing Cardiac: regular HR Abdomen: soft, NT, no masses Skin: without rashes Vascular Exam/Pulses: Absent pedal pulses; 2+ right femoral pulse; 1+ left femoral pulse Extremities: without ischemic changes, without Gangrene , without cellulitis; without open wounds;  Musculoskeletal: no muscle wasting or atrophy  Neurologic: A&O X 3 Psychiatric:  The pt has Normal affect.  Non-Invasive Vascular Imaging:   Right lower extremity bypass duplex demonstrates low flow velocity in the distal bypass and distal  anastomosis  ABI/TBIToday's ABIToday's TBIPrevious ABIPrevious TBI  +-------+-----------+-----------+------------+------------+  Right 0.86       0.49       1.16        0.54          +-------+-----------+-----------+------------+------------+  Left  0.88       0.46       0.94        0.37          +-------+-----------+-----------+------------+------------+     ASSESSMENT/PLAN:: 73 y.o. male here for surveillance of PAD with history of right iliofemoral endarterectomy with femoral to peroneal composite bypass  Mr. Jaco is a 73 year old male with history of right iliofemoral endarterectomy with femoral to peroneal composite bypass performed due to tissue loss of the right foot.  He is without claudication, rest pain, or tissue loss of bilateral lower extremities.  On duplex he of the right leg bypass he has low flow velocities in the distal bypass and distal anastomosis which may threaten the patency of the bypass graft.  He has also experienced a drop in right ABI and TBI since last office visit.  Based on duplex findings he was offered aortogram with right leg runoff and possible angioplasty of the distal bypass, distal anastomosis, and potentially tibial outflow.  Past medical history also significant for recent partial nephrectomy due to kidney neoplasm.  Postoperative lab work demonstrates normal kidney function.  He also has history of upper GI bleed about 2 years ago requiring transfusion for hemorrhagic shock.  All antiplatelet medication were discontinued at this time and has not been restarted.  He has not seen GI in follow-up since that time.  He is aware he would require antiplatelet therapy if we were to intervene endovascularly on right leg bypass.  Case we discussed with Dr. Myra Gianotti.  He will tentatively be scheduled in the next month for the above procedure.  Emilie Rutter, PA-C [facility] [phone]  Clinic MD:   [facility] Spore on call

## 2023-10-21 ENCOUNTER — Telehealth: Payer: Self-pay

## 2023-10-21 NOTE — Telephone Encounter (Signed)
 Attempted to call for surgery scheduling. Joshua Curtis

## 2023-10-27 ENCOUNTER — Telehealth: Payer: Self-pay

## 2023-10-27 NOTE — Telephone Encounter (Signed)
 Attempted to call for surgery scheduling. LVM

## 2023-10-30 ENCOUNTER — Telehealth: Payer: Self-pay

## 2023-10-30 NOTE — Telephone Encounter (Signed)
 Attempted to call for surgery scheduling. LVM

## 2023-10-31 ENCOUNTER — Other Ambulatory Visit: Payer: Self-pay

## 2023-10-31 DIAGNOSIS — I739 Peripheral vascular disease, unspecified: Secondary | ICD-10-CM

## 2023-11-05 DIAGNOSIS — C61 Malignant neoplasm of prostate: Secondary | ICD-10-CM | POA: Diagnosis not present

## 2023-11-11 ENCOUNTER — Encounter (HOSPITAL_COMMUNITY)
Admission: RE | Disposition: A | Discharge status: Home / Self Care | Payer: Self-pay | Source: Home / Self Care | Attending: Surgery

## 2023-11-11 ENCOUNTER — Ambulatory Visit (HOSPITAL_COMMUNITY)
Admission: RE | Admit: 2023-11-11 | Discharge: 2023-11-11 | Disposition: A | Discharge status: Home / Self Care | Payer: PPO | Attending: Surgery | Admitting: Surgery

## 2023-11-11 ENCOUNTER — Other Ambulatory Visit: Payer: Self-pay

## 2023-11-11 DIAGNOSIS — Z905 Acquired absence of kidney: Secondary | ICD-10-CM | POA: Diagnosis not present

## 2023-11-11 DIAGNOSIS — F17210 Nicotine dependence, cigarettes, uncomplicated: Secondary | ICD-10-CM | POA: Insufficient documentation

## 2023-11-11 DIAGNOSIS — Y832 Surgical operation with anastomosis, bypass or graft as the cause of abnormal reaction of the patient, or of later complication, without mention of misadventure at the time of the procedure: Secondary | ICD-10-CM | POA: Insufficient documentation

## 2023-11-11 DIAGNOSIS — I739 Peripheral vascular disease, unspecified: Secondary | ICD-10-CM | POA: Insufficient documentation

## 2023-11-11 DIAGNOSIS — T82858A Stenosis of vascular prosthetic devices, implants and grafts, initial encounter: Secondary | ICD-10-CM | POA: Diagnosis not present

## 2023-11-11 HISTORY — PX: ABDOMINAL AORTOGRAM W/LOWER EXTREMITY: CATH118223

## 2023-11-11 HISTORY — PX: LOWER EXTREMITY INTERVENTION: CATH118252

## 2023-11-11 LAB — POCT I-STAT, CHEM 8
BUN: 14 mg/dL (ref 8–23)
Calcium, Ion: 1.2 mmol/L (ref 1.15–1.40)
Chloride: 106 mmol/L (ref 98–111)
Creatinine, Ser: 1.7 mg/dL — ABNORMAL HIGH (ref 0.61–1.24)
Glucose, Bld: 105 mg/dL — ABNORMAL HIGH (ref 70–99)
HCT: 40 % (ref 39.0–52.0)
Hemoglobin: 13.6 g/dL (ref 13.0–17.0)
Potassium: 3.8 mmol/L (ref 3.5–5.1)
Sodium: 141 mmol/L (ref 135–145)
TCO2: 24 mmol/L (ref 22–32)

## 2023-11-11 SURGERY — ABDOMINAL AORTOGRAM W/LOWER EXTREMITY
Anesthesia: LOCAL | Laterality: Right

## 2023-11-11 MED ORDER — SODIUM CHLORIDE 0.9% FLUSH: 3.0000 mL | INTRAVENOUS | Status: DC | PRN

## 2023-11-11 MED ORDER — MIDAZOLAM HCL 2 MG/2ML IJ SOLN
INTRAMUSCULAR | Status: DC | PRN
Start: 2023-11-11 — End: 2023-11-11
  Administered 2023-11-11: 1 mg via INTRAVENOUS
  Administered 2023-11-11: 2 mg via INTRAVENOUS

## 2023-11-11 MED ORDER — ONDANSETRON HCL 4 MG/2ML IJ SOLN: 4.0000 mg | Freq: Four times a day (QID) | INTRAMUSCULAR | Status: DC | PRN

## 2023-11-11 MED ORDER — MIDAZOLAM HCL 2 MG/2ML IJ SOLN
INTRAMUSCULAR | Status: AC
  Filled 2023-11-11: qty 2

## 2023-11-11 MED ORDER — SODIUM CHLORIDE 0.9 % WEIGHT BASED INFUSION: 1.0000 mL/kg/h | INTRAVENOUS | Status: DC

## 2023-11-11 MED ORDER — FENTANYL CITRATE (PF) 100 MCG/2ML IJ SOLN
INTRAMUSCULAR | Status: DC | PRN
  Administered 2023-11-11: 25 ug via INTRAVENOUS
  Administered 2023-11-11: 50 ug via INTRAVENOUS

## 2023-11-11 MED ORDER — SODIUM CHLORIDE 0.9% FLUSH: 3.0000 mL | Freq: Two times a day (BID) | INTRAVENOUS | Status: DC

## 2023-11-11 MED ORDER — SODIUM CHLORIDE 0.9 % IV SOLN: INTRAVENOUS | Status: DC

## 2023-11-11 MED ORDER — HEPARIN (PORCINE) IN NACL 1000-0.9 UT/500ML-% IV SOLN
INTRAVENOUS | Status: DC | PRN
  Administered 2023-11-11 (×2): 500 mL

## 2023-11-11 MED ORDER — SODIUM CHLORIDE 0.9 % IV SOLN: 250.0000 mL | INTRAVENOUS | Status: DC | PRN

## 2023-11-11 MED ORDER — LIDOCAINE HCL (PF) 1 % IJ SOLN
INTRAMUSCULAR | Status: AC
  Filled 2023-11-11: qty 30

## 2023-11-11 MED ORDER — HEPARIN SODIUM (PORCINE) 1000 UNIT/ML IJ SOLN
INTRAMUSCULAR | Status: DC | PRN
  Administered 2023-11-11: 7000 [IU] via INTRAVENOUS

## 2023-11-11 MED ORDER — ASPIRIN 81 MG PO TBEC: 81.0000 mg | DELAYED_RELEASE_TABLET | Freq: Every day | ORAL | 12 refills | Status: AC

## 2023-11-11 MED ORDER — HEPARIN SODIUM (PORCINE) 1000 UNIT/ML IJ SOLN
INTRAMUSCULAR | Status: AC
  Filled 2023-11-11: qty 10

## 2023-11-11 MED ORDER — LABETALOL HCL 5 MG/ML IV SOLN: 10.0000 mg | INTRAVENOUS | Status: DC | PRN

## 2023-11-11 MED ORDER — ASPIRIN 81 MG PO TBEC
81.0000 mg | DELAYED_RELEASE_TABLET | Freq: Every day | ORAL | Status: DC
  Administered 2023-11-11: 81 mg via ORAL
  Filled 2023-11-11: qty 1

## 2023-11-11 MED ORDER — IODIXANOL 320 MG/ML IV SOLN
INTRAVENOUS | Status: DC | PRN
Start: 2023-11-11 — End: 2023-11-11
  Administered 2023-11-11: 50 mL

## 2023-11-11 MED ORDER — OXYCODONE HCL 5 MG PO TABS: 5.0000 mg | ORAL_TABLET | ORAL | Status: DC | PRN

## 2023-11-11 MED ORDER — MORPHINE SULFATE (PF) 2 MG/ML IV SOLN: 2.0000 mg | INTRAVENOUS | Status: DC | PRN

## 2023-11-11 MED ORDER — HYDRALAZINE HCL 20 MG/ML IJ SOLN: 5.0000 mg | INTRAMUSCULAR | Status: DC | PRN

## 2023-11-11 MED ORDER — ACETAMINOPHEN 325 MG PO TABS: 650.0000 mg | ORAL_TABLET | ORAL | Status: DC | PRN

## 2023-11-11 MED ORDER — FENTANYL CITRATE (PF) 100 MCG/2ML IJ SOLN
INTRAMUSCULAR | Status: AC
Start: 2023-11-11 — End: ?
  Filled 2023-11-11: qty 2

## 2023-11-11 MED ORDER — LIDOCAINE HCL (PF) 1 % IJ SOLN
INTRAMUSCULAR | Status: DC | PRN
  Administered 2023-11-11: 10 mL

## 2023-11-11 SURGICAL SUPPLY — 16 items
BALLN STERLING OTW 5X60X135 (BALLOONS) ×1 IMPLANT
BALLOON STERLING OTW 5X60X135 (BALLOONS) IMPLANT
CATH ANGIO 5F BER2 65CM (CATHETERS) IMPLANT
CATH OMNI FLUSH 5F 65CM (CATHETERS) IMPLANT
CLOSURE MYNX CONTROL 5F (Vascular Products) IMPLANT
KIT ENCORE 26 ADVANTAGE (KITS) IMPLANT
KIT MICROPUNCTURE NIT STIFF (SHEATH) IMPLANT
KIT SINGLE USE MANIFOLD (KITS) IMPLANT
KIT SYRINGE INJ CVI SPIKEX1 (MISCELLANEOUS) IMPLANT
PACK CARDIAC CATHETERIZATION (CUSTOM PROCEDURE TRAY) IMPLANT
SET ATX-X65L (MISCELLANEOUS) IMPLANT
SHEATH CATAPULT 5F 45 MP (SHEATH) IMPLANT
SHEATH PINNACLE 5F 10CM (SHEATH) IMPLANT
SHEATH PROBE COVER 6X72 (BAG) IMPLANT
WIRE BENTSON .035X145CM (WIRE) IMPLANT
WIRE G V18X300CM (WIRE) IMPLANT

## 2023-11-11 NOTE — Interval H&P Note (Signed)
 History and Physical Interval Note:  11/11/2023 7:33 AM  Joshua Curtis  has presented today for surgery, with the diagnosis of PVD.  The various methods of treatment have been discussed with the patient and family. After consideration of risks, benefits and other options for treatment, the patient has consented to  Procedure(s): ABDOMINAL AORTOGRAM W/ RIGHT LOWER EXTREMITY RUNOFF AND POSSIBLE INTERVENTION (N/A) as a surgical intervention.  The patient's history has been reviewed, patient examined, no change in status, stable for surgery.  I have reviewed the patient's chart and labs.  Questions were answered to the patient's satisfaction.     Durene Cal

## 2023-11-11 NOTE — Discharge Instructions (Signed)
 Femoral Site Care This sheet gives you information about how to care for yourself after your procedure. Your health care provider may also give you more specific instructions. If you have problems or questions, contact your health care provider. What can I expect after the procedure?  After the procedure, it is common to have: Bruising that usually fades within 1-2 weeks. Tenderness at the site. Follow these instructions at home: Wound care Follow instructions from your health care provider about how to take care of your insertion site. Make sure you: Wash your hands with soap and water before you change your bandage (dressing). If soap and water are not available, use hand sanitizer. Remove your dressing as told by your health care provider. 24 hours Do not take baths, swim, or use a hot tub until your health care provider approves. You may shower 24-48 hours after the procedure or as told by your health care provider. Gently wash the site with plain soap and water. Pat the area dry with a clean towel. Do not rub the site. This may cause bleeding. Do not apply powder or lotion to the site. Keep the site clean and dry. Check your femoral site every day for signs of infection. Check for: Redness, swelling, or pain. Fluid or blood. Warmth. Pus or a bad smell. Activity For the first 2-3 days after your procedure, or as long as directed: Avoid climbing stairs as much as possible. Do not squat. Do not lift anything that is heavier than 10 lb (4.5 kg), or the limit that you are told, until your health care provider says that it is safe. For 5 days Rest as directed. Avoid sitting for a long time without moving. Get up to take short walks every 1-2 hours. Do not drive for 24 hours if you were given a medicine to help you relax (sedative). General instructions Take over-the-counter and prescription medicines only as told by your health care provider. Keep all follow-up visits as told by your  health care provider. This is important. Contact a health care provider if you have: A fever or chills. You have redness, swelling, or pain around your insertion site. Get help right away if: The catheter insertion area swells very fast. You Lwin out. You suddenly start to sweat or your skin gets clammy. The catheter insertion area is bleeding, and the bleeding does not stop when you hold steady pressure on the area. The area near or just beyond the catheter insertion site becomes pale, cool, tingly, or numb. These symptoms may represent a serious problem that is an emergency. Do not wait to see if the symptoms will go away. Get medical help right away. Call your local emergency services (911 in the [place] Do not drive yourself to the hospital. Summary After the procedure, it is common to have bruising that usually fades within 1-2 weeks. Check your femoral site every day for signs of infection. Do not lift anything that is heavier than 10 lb (4.5 kg), or the limit that you are told, until your health care provider says that it is safe. This information is not intended to replace advice given to you by your health care provider. Make sure you discuss any questions you have with your health care provider. Document Revised: 09/01/2017 Document Reviewed: 09/01/2017 Elsevier Patient Education  2020 [employer].

## 2023-11-11 NOTE — Op Note (Signed)
 Patient name: Joshua Curtis MRN: [id number] DOB: [birth year] Sex: male  11/11/2023 Pre-operative Diagnosis: Right bypass graft stenosis Post-operative diagnosis:  Same Surgeon:  Durene Cal Procedure Performed:  1.  Ultrasound-guided access, left femoral artery  2.  Abdominal aortogram  3.  Right lower extremity angiogram with catheter in right external iliac artery  4.  Selective injection with catheter in right femoral peroneal bypass graft  5.  Angioplasty right femoral peroneal bypass graft  6.  Conscious sedation, 47 minutes  7.  Closure device, Mynx   Indications: This is a 73 year old gentleman with history of bilateral lower extremity interventions.  His most recent surveillance ultrasound suggested low flows within his bypass graft and drop in his ABIs.  He comes in today for further evaluation  Procedure:  The patient was identified in the holding area and taken to room 8.  The patient was then placed supine on the table and prepped and draped in the usual sterile fashion.  A time out was called.  Conscious sedation was administered with the use of IV fentanyl and Versed under continuous physician and nurse monitoring.  Heart rate, blood pressure, and oxygen saturation were continuously monitored.  Total sedation time was 47 minutes.  Ultrasound was used to evaluate the left common femoral artery.  It was patent .  A digital ultrasound image was acquired.  A micropuncture needle was used to access the left common femoral artery under ultrasound guidance.  An 018 wire was advanced without resistance and a micropuncture sheath was placed.  The 018 wire was removed and a benson wire was placed.  The micropuncture sheath was exchanged for a 5 french sheath.  An omniflush catheter was advanced over the wire to the level of L-1.  An abdominal angiogram was obtained.  Next, using the omniflush catheter and a benson wire, the aortic bifurcation was crossed and the catheter was placed into  theright external iliac artery and right runoff was obtained.    Findings:   Aortogram: No significant renal artery stenosis.  The infrarenal abdominal aorta is widely patent.  Bilateral common and external iliac arteries are widely patent.  There is patulous dilatation of the right common femoral artery.  The left common femoral artery is calcified but widely patent  Right Lower Extremity: The right common femoral artery is patulous from previous angioplasty repair.  The profundofemoral artery is widely patent.  The superficial femoral artery is occluded.  A bypass graft is seen originating from the common femoral artery.  There is significantly delayed flow through the bypass graft.  The bypass appears to be widely patent throughout its course.  The distal anastomosis is widely patent.  There is single-vessel runoff via the peroneal artery.  There is diffuse disease out onto the foot  Left Lower Extremity: Not evaluated due to a creatinine of 1.7  Intervention: After the above images were evaluated the decision was made to proceed with intervention.  A 5 French 45 cm sheath was advanced over the aortic bifurcation into the right external iliac artery.  Using a Berenstein 2 catheter and a V-18 wire, the bypass graft was selected.  I then performed balloon angioplasty of the proximal anastomosis using a 5 x 60 Sterling balloon.  This was done at burst pressure for 2 minutes.  Follow-up imaging showed significantly improved blood flow through the bypass graft.  Additional images of the bypass graft were performed with the catheter and it.  This showed that the remaining portion  of the bypass graft was widely patent.  The groin was closed with a Mynx  Impression:  #1  No significant aortoiliac occlusive disease  #2  Proximal anastomotic bypass graft stenosis treated using a 5 mm balloon  #3  Distal anastomosis is widely patent.  There is single-vessel runoff via the peroneal artery.  There is diffuse  disease out onto the foot --Because of the patient's history of GI bleeding, I will keep him on aspirin alone as single therapy and not add Plavix.  Juleen China, M.D., [facility] [place] Office: [phone] Pager:  [phone]

## 2023-11-12 ENCOUNTER — Encounter (HOSPITAL_COMMUNITY): Payer: Self-pay | Admitting: Surgery

## 2023-11-12 DIAGNOSIS — C61 Malignant neoplasm of prostate: Secondary | ICD-10-CM | POA: Diagnosis not present

## 2023-11-12 DIAGNOSIS — Z85528 Personal history of other malignant neoplasm of kidney: Secondary | ICD-10-CM | POA: Diagnosis not present

## 2023-12-18 ENCOUNTER — Other Ambulatory Visit: Payer: Self-pay | Admitting: *Deleted

## 2023-12-18 DIAGNOSIS — I739 Peripheral vascular disease, unspecified: Secondary | ICD-10-CM

## 2023-12-18 DIAGNOSIS — I7025 Atherosclerosis of native arteries of other extremities with ulceration: Secondary | ICD-10-CM

## 2023-12-29 ENCOUNTER — Ambulatory Visit (HOSPITAL_COMMUNITY)
Admission: RE | Admit: 2023-12-29 | Discharge: 2023-12-29 | Disposition: A | Discharge status: Home / Self Care | Source: Ambulatory Visit | Attending: Surgery | Admitting: Surgery

## 2023-12-29 ENCOUNTER — Ambulatory Visit (INDEPENDENT_AMBULATORY_CARE_PROVIDER_SITE_OTHER): Admitting: Physician Assistant

## 2023-12-29 VITALS — BP 149/77 | HR 74 | Temp 98.4°F | Ht 70.0 in | Wt 169.1 lb

## 2023-12-29 DIAGNOSIS — I7025 Atherosclerosis of native arteries of other extremities with ulceration: Secondary | ICD-10-CM | POA: Diagnosis not present

## 2023-12-29 DIAGNOSIS — I739 Peripheral vascular disease, unspecified: Secondary | ICD-10-CM

## 2023-12-29 LAB — VAS US ABI WITH/WO TBI
Left ABI: 0.87
Right ABI: 0.89

## 2023-12-29 NOTE — Progress Notes (Signed)
 Office Note     CC:  follow up Requesting Provider:  Roslyn Coombe, MD  HPI: Joshua Curtis is a 73 y.o. (11-22-1950) male who presents status post right lower extremity angiogram with angioplasty of the proximal anastomosis of the femoral to peroneal bypass graft by Dr. Charlotte Cookey on 11/11/2023.  He denies any claudication, rest pain, or tissue loss of bilateral lower extremities.  He is on aspirin  only due to history of GI bleed with hemorrhagic shock.  He believes his left groin access site has healed completely.   Past Medical History:  Diagnosis Date   Anemia, iron  deficiency 04/02/2016   Aortic atherosclerosis (HCC)    Arthritis    rt knee   BPH (benign prostatic hyperplasia) 04/10/2017   Cancer (HCC)    prostate   COLONIC POLYPS, HX OF 11/24/2007   COPD (chronic obstructive pulmonary disease) (HCC)    pt denies this- no respiratory issues of any kind per pt   Coronary artery calcification seen on CAT scan    DIVERTICULOSIS, COLON 11/24/2007   ED (erectile dysfunction)    GERD (gastroesophageal reflux disease)    HLD (hyperlipidemia) 09/06/2020   HYPERTENSION 11/24/2007   Increased prostate specific antigen (PSA) velocity 06/22/2011   PAH (pulmonary artery hypertension) (HCC)    Peripheral vascular disease (HCC)     Past Surgical History:  Procedure Laterality Date   ABDOMINAL AORTOGRAM W/LOWER EXTREMITY N/A 07/04/2020   Procedure: ABDOMINAL AORTOGRAM W/LOWER EXTREMITY;  Surgeon: Margherita Shell, MD;  Location: [facility];  Service: Cardiovascular;  Laterality: N/A;   ABDOMINAL AORTOGRAM W/LOWER EXTREMITY N/A 07/18/2020   Procedure: ABDOMINAL AORTOGRAM W/LOWER EXTREMITY;  Surgeon: Margherita Shell, MD;  Location: [facility];  Service: Cardiovascular;  Laterality: N/A;   ABDOMINAL AORTOGRAM W/LOWER EXTREMITY N/A 04/04/2021   Procedure: ABDOMINAL AORTOGRAM W/LOWER EXTREMITY;  Surgeon: Carlene Che, MD;  Location: [facility];  Service: Cardiovascular;   Laterality: N/A;   ABDOMINAL AORTOGRAM W/LOWER EXTREMITY Right 11/11/2023   Procedure: ABDOMINAL AORTOGRAM W/ RIGHT LOWER EXTREMITY RUNOFF AND POSSIBLE INTERVENTION;  Surgeon: Margherita Shell, MD;  Location: [facility];  Service: Cardiovascular;  Laterality: Right;   BYPASS GRAFT FEMORAL-PERONEAL Right 04/13/2021   Procedure: RIGHT LEG FEMORAL-PERONEAL BYPASS USING PROPATEN GRAFT;  Surgeon: Margherita Shell, MD;  Location: [facility];  Service: Vascular;  Laterality: Right;   COLONOSCOPY     ESOPHAGOGASTRODUODENOSCOPY (EGD) WITH PROPOFOL  N/A 12/09/2021   Procedure: ESOPHAGOGASTRODUODENOSCOPY (EGD) WITH PROPOFOL ;  Surgeon: Alvis Jourdain, MD;  Location: [facility];  Service: Gastroenterology;  Laterality: N/A;   HOT HEMOSTASIS N/A 12/09/2021   Procedure: HOT HEMOSTASIS (ARGON PLASMA COAGULATION/BICAP);  Surgeon: Alvis Jourdain, MD;  Location: [facility];  Service: Gastroenterology;  Laterality: N/A;   LOWER EXTREMITY INTERVENTION Right 11/11/2023   Procedure: LOWER EXTREMITY INTERVENTION;  Surgeon: Margherita Shell, MD;  Location: [facility];  Service: Cardiovascular;  Laterality: Right;   PATCH ANGIOPLASTY Right 04/13/2021   Procedure: Bishop Bullock USING VEIN PATCH ANGIOPLASTY;  Surgeon: Margherita Shell, MD;  Location: [facility];  Service: Vascular;  Laterality: Right;   PERIPHERAL VASCULAR BALLOON ANGIOPLASTY Right 07/18/2020   Procedure: PERIPHERAL VASCULAR BALLOON ANGIOPLASTY;  Surgeon: Margherita Shell, MD;  Location: [facility];  Service: Cardiovascular;  Laterality: Right;  tibioperoneal trunk and posterior tibial   PERIPHERAL VASCULAR INTERVENTION  07/04/2020   Procedure: PERIPHERAL VASCULAR INTERVENTION;  Surgeon: Margherita Shell, MD;  [facility]: [facility];  Service:  Cardiovascular;;  Lt. SFA and Popliteal   PERIPHERAL VASCULAR INTERVENTION Right 07/18/2020   Procedure: PERIPHERAL VASCULAR INTERVENTION;  Surgeon: Margherita Shell, MD;  [facility];  Service: Cardiovascular;  Laterality: Right;  Femoral Popliteal   ROBOT ASSISTED LAPAROSCOPIC RADICAL PROSTATECTOMY N/A 01/08/2021   Procedure: XI ROBOTIC ASSISTED LAPAROSCOPIC RADICAL PROSTATECTOMY LEVEL 3;  Surgeon: Florencio Hunting, MD;  Location: [facility];  Service: Urology;  Laterality: N/A;   ROBOTIC ASSITED PARTIAL NEPHRECTOMY Left 08/18/2023   Procedure: XI LEFT ROBOTIC ASSISTED LAPAROSCOPIC PARTIAL NEPHRECTOMY;  Surgeon: Florencio Hunting, MD;  Location: [facility];  Service: Urology;  Laterality: Left;  210 MINUTES NEEDED FOR CASE   SCLEROTHERAPY  12/09/2021   Procedure: SCLEROTHERAPY;  Surgeon: Alvis Jourdain, MD;  Location: [facility];  Service: Gastroenterology;;    Social History   Socioeconomic History   Marital status: Married    Spouse name: Sonya   Number of children: 2   Years of education: Not on file   Highest education level: Not on file  Occupational History   Occupation: part time hotel  Tobacco Use   Smoking status: Some Days    Current packs/day: 0.00    Types: Cigarettes    Last attempt to quit: 04/22/2021    Years since quitting: 2.6   Smokeless tobacco: Never   Tobacco comments:    tryimg to quit  Vaping Use   Vaping status: Never Used  Substance and Sexual Activity   Alcohol use: Not Currently    Comment: beer rare or mix drink daily.   Drug use: No   Sexual activity: Not Currently  Other Topics Concern   Not on file  Social History Narrative   Lives with wife and daughter and grandson   Social Drivers of Health   Financial Resource Strain: Low Risk  (09/05/2023)   Overall Financial Resource Strain (CARDIA)    Difficulty of Paying Living Expenses: Not hard at all  Food Insecurity: No Food Insecurity (09/05/2023)   Hunger Vital Sign    Worried About Running Out of Food in the Last Year: Never true    Ran Out of Food in the Last Year: Never true  Transportation Needs: No Transportation Needs (09/05/2023)   PRAPARE - Scientist, research (physical sciences) (Medical): No    Lack of Transportation (Non-Medical): No  Physical Activity: Sufficiently Active (09/05/2023)   Exercise Vital Sign    Days of Exercise per Week: 7 days    Minutes of Exercise per Session: 30 min  Stress: No Stress Concern Present (09/05/2023)   [employer] of Occupational Health - Occupational Stress Questionnaire    Feeling of Stress : Not at all  Social Connections: Moderately Integrated (09/05/2023)   Social Connection and Isolation Panel [NHANES]    Frequency of Communication with Friends and Family: More than three times a week    Frequency of Social Gatherings with Friends and Family: Once a week    Attends Religious Services: 1 to 4 times per year    Active Member of [employer] or Organizations: No    Attends Banker Meetings: Never    Marital Status: Married  Catering manager Violence: Not At Risk (09/05/2023)   Humiliation, Afraid, Rape, and Kick questionnaire    Fear of Current or Ex-Partner: No    Emotionally Abused: No    Physically Abused: No    Sexually Abused: No    Family History  Problem Relation Age of Onset   Stroke  Mother    Diabetes Father    Hypertension Sister    Kidney failure Brother        dialysis   Diabetes Paternal Grandfather    Anuerysm Brother    Colon cancer Neg Hx    Esophageal cancer Neg Hx    Rectal cancer Neg Hx    Stomach cancer Neg Hx     Current Outpatient Medications  Medication Sig Dispense Refill   amLODipine  (NORVASC ) 10 MG tablet Take 1 tablet (10 mg total) by mouth daily. 90 tablet 3   aspirin  EC 81 MG tablet Take 1 tablet (81 mg total) by mouth daily. Swallow whole. 30 tablet 12   ferrous sulfate  325 (65 FE) MG tablet Take 325 mg by mouth daily with breakfast.     rosuvastatin  (CRESTOR ) 40 MG tablet Take 40 mg by mouth every morning.     No current facility-administered medications for this visit.    No Known Allergies   REVIEW OF SYSTEMS:   [X]  denotes positive finding, [ ]   denotes negative finding Cardiac  Comments:  Chest pain or chest pressure:    Shortness of breath upon exertion:    Short of breath when lying flat:    Irregular heart rhythm:        Vascular    Pain in calf, thigh, or hip brought on by ambulation:    Pain in feet at night that wakes you up from your sleep:     Blood clot in your veins:    Leg swelling:         Pulmonary    Oxygen at home:    Productive cough:     Wheezing:         Neurologic    Sudden weakness in arms or legs:     Sudden numbness in arms or legs:     Sudden onset of difficulty speaking or slurred speech:    Temporary loss of vision in one eye:     Problems with dizziness:         Gastrointestinal    Blood in stool:     Vomited blood:         Genitourinary    Burning when urinating:     Blood in urine:        Psychiatric    Major depression:         Hematologic    Bleeding problems:    Problems with blood clotting too easily:        Skin    Rashes or ulcers:        Constitutional    Fever or chills:      PHYSICAL EXAMINATION:  Vitals:   12/29/23 1345  BP: (!) 149/77  Pulse: 74  Temp: 98.4 F (36.9 C)  TempSrc: Temporal  SpO2: 93%  Weight: 169 lb 1.6 oz (76.7 kg)  Height: 5\' 10"  (1.778 m)    General:  WDWN in NAD; vital signs documented above Gait: Not observed HENT: WNL, normocephalic Pulmonary: normal non-labored breathing , without Rales, rhonchi,  wheezing Cardiac: regular HR Abdomen: soft, NT, no masses Skin: without rashes Vascular Exam/Pulses: Absent pedal pulses; left groin access site without hematoma Extremities: without ischemic changes, without Gangrene , without cellulitis; without open wounds;  Musculoskeletal: no muscle wasting or atrophy  Neurologic: A&O X 3 Psychiatric:  The pt has Normal affect.   Non-Invasive Vascular Imaging:   Right leg bypass patent with low flow velocities throughout similar to duplex prior  to angiogram  ABI/TBIToday's ABIToday's  TBIPrevious ABIPrevious TBI  +-------+-----------+-----------+------------+------------+  Right 0.89       0.47       0.86        0.49          +-------+-----------+-----------+------------+------------+  Left  0.87       0.58       0.88        0.46          +-------+-----------+-----------+------------+---------     ASSESSMENT/PLAN:: 73 y.o. male status post right lower extremity angiogram with balloon angioplasty of the proximal anastomosis of the femoral to peroneal bypass  Right leg well-perfused without claudication rest pain or tissue loss.  Despite angioplasty of the proximal anastomosis, the flow velocities continue to be low throughout the graft.  He will continue his aspirin  alone due to recent history of GI bleed.  I encouraged him to continue to walk for exercise.  We will repeat surveillance with right lower extremity arterial duplex and ABI in 6 months.  He knows to call/turn office sooner with any questions or concerns.   Cordie Deters, PA-C [facility] [phone]  Clinic MD:   Charlotte Cookey

## 2024-01-08 ENCOUNTER — Other Ambulatory Visit: Payer: Self-pay | Admitting: *Deleted

## 2024-01-08 DIAGNOSIS — I7025 Atherosclerosis of native arteries of other extremities with ulceration: Secondary | ICD-10-CM

## 2024-01-08 DIAGNOSIS — I739 Peripheral vascular disease, unspecified: Secondary | ICD-10-CM

## 2024-03-10 ENCOUNTER — Encounter: Payer: Self-pay | Admitting: Internal Medicine

## 2024-03-10 ENCOUNTER — Ambulatory Visit: Payer: Self-pay | Admitting: Internal Medicine

## 2024-03-10 ENCOUNTER — Ambulatory Visit (INDEPENDENT_AMBULATORY_CARE_PROVIDER_SITE_OTHER): Payer: PPO | Admitting: Internal Medicine

## 2024-03-10 VITALS — BP 156/78 | HR 82 | Temp 97.8°F | Ht 70.0 in | Wt 165.8 lb

## 2024-03-10 DIAGNOSIS — F17200 Nicotine dependence, unspecified, uncomplicated: Secondary | ICD-10-CM

## 2024-03-10 DIAGNOSIS — E559 Vitamin D deficiency, unspecified: Secondary | ICD-10-CM

## 2024-03-10 DIAGNOSIS — N1831 Chronic kidney disease, stage 3a: Secondary | ICD-10-CM | POA: Diagnosis not present

## 2024-03-10 DIAGNOSIS — E538 Deficiency of other specified B group vitamins: Secondary | ICD-10-CM

## 2024-03-10 DIAGNOSIS — I739 Peripheral vascular disease, unspecified: Secondary | ICD-10-CM | POA: Diagnosis not present

## 2024-03-10 DIAGNOSIS — R739 Hyperglycemia, unspecified: Secondary | ICD-10-CM | POA: Diagnosis not present

## 2024-03-10 DIAGNOSIS — E7800 Pure hypercholesterolemia, unspecified: Secondary | ICD-10-CM | POA: Diagnosis not present

## 2024-03-10 DIAGNOSIS — I10 Essential (primary) hypertension: Secondary | ICD-10-CM

## 2024-03-10 LAB — CBC WITH DIFFERENTIAL/PLATELET
Basophils Absolute: 0.1 K/uL (ref 0.0–0.1)
Basophils Relative: 1.9 % (ref 0.0–3.0)
Eosinophils Absolute: 0.6 K/uL (ref 0.0–0.7)
Eosinophils Relative: 10.9 % — ABNORMAL HIGH (ref 0.0–5.0)
HCT: 42.7 % (ref 39.0–52.0)
Hemoglobin: 14.1 g/dL (ref 13.0–17.0)
Lymphocytes Relative: 22.5 % (ref 12.0–46.0)
Lymphs Abs: 1.2 K/uL (ref 0.7–4.0)
MCHC: 33 g/dL (ref 30.0–36.0)
MCV: 92.5 fl (ref 78.0–100.0)
Monocytes Absolute: 0.5 K/uL (ref 0.1–1.0)
Monocytes Relative: 8.7 % (ref 3.0–12.0)
Neutro Abs: 3 K/uL (ref 1.4–7.7)
Neutrophils Relative %: 56 % (ref 43.0–77.0)
Platelets: 183 K/uL (ref 150.0–400.0)
RBC: 4.62 Mil/uL (ref 4.22–5.81)
RDW: 15.6 % — ABNORMAL HIGH (ref 11.5–15.5)
WBC: 5.4 K/uL (ref 4.0–10.5)

## 2024-03-10 LAB — URINALYSIS, ROUTINE W REFLEX MICROSCOPIC
Bilirubin Urine: NEGATIVE
Hgb urine dipstick: NEGATIVE
Ketones, ur: NEGATIVE
Nitrite: NEGATIVE
RBC / HPF: NONE SEEN (ref 0–?)
Specific Gravity, Urine: 1.015 (ref 1.000–1.030)
Total Protein, Urine: NEGATIVE
Urine Glucose: NEGATIVE
Urobilinogen, UA: 0.2 (ref 0.0–1.0)
pH: 6 (ref 5.0–8.0)

## 2024-03-10 LAB — LIPID PANEL
Cholesterol: 82 mg/dL (ref 0–200)
HDL: 46.2 mg/dL (ref >39.00)
LDL Cholesterol: 23 mg/dL (ref 0–99)
NonHDL: 36.24
Total CHOL/HDL Ratio: 2
Triglycerides: 64 mg/dL (ref 0.0–149.0)
VLDL: 12.8 mg/dL (ref 0.0–40.0)

## 2024-03-10 LAB — BASIC METABOLIC PANEL WITH GFR
BUN: 18 mg/dL (ref 6–23)
CO2: 26 meq/L (ref 19–32)
Calcium: 8.7 mg/dL (ref 8.4–10.5)
Chloride: 106 meq/L (ref 96–112)
Creatinine, Ser: 1.28 mg/dL (ref 0.40–1.50)
GFR: 56.52 mL/min — ABNORMAL LOW (ref >60.00)
Glucose, Bld: 96 mg/dL (ref 70–99)
Potassium: 3.8 meq/L (ref 3.5–5.1)
Sodium: 141 meq/L (ref 135–145)

## 2024-03-10 LAB — HEPATIC FUNCTION PANEL
ALT: 35 U/L (ref 0–53)
AST: 29 U/L (ref 0–37)
Albumin: 4.5 g/dL (ref 3.5–5.2)
Alkaline Phosphatase: 49 U/L (ref 39–117)
Bilirubin, Direct: 0.2 mg/dL (ref 0.0–0.3)
Total Bilirubin: 0.5 mg/dL (ref 0.2–1.2)
Total Protein: 7.3 g/dL (ref 6.0–8.3)

## 2024-03-10 LAB — VITAMIN D 25 HYDROXY (VIT D DEFICIENCY, FRACTURES): VITD: 13.7 ng/mL — ABNORMAL LOW (ref 30.00–100.00)

## 2024-03-10 LAB — TSH: TSH: 1.05 u[IU]/mL (ref 0.35–5.50)

## 2024-03-10 LAB — VITAMIN B12: Vitamin B-12: 338 pg/mL (ref 211–911)

## 2024-03-10 LAB — HEMOGLOBIN A1C: Hgb A1c MFr Bld: 6.4 % (ref 4.6–6.5)

## 2024-03-10 MED ORDER — METOPROLOL SUCCINATE ER 50 MG PO TB24: 50.0000 mg | ORAL_TABLET | Freq: Every day | ORAL | 3 refills | Status: AC

## 2024-03-10 MED ORDER — AMLODIPINE BESYLATE 10 MG PO TABS: 10.0000 mg | ORAL_TABLET | Freq: Every day | ORAL | 3 refills | Status: AC

## 2024-03-10 NOTE — Progress Notes (Signed)
 The test results show that your current treatment is OK, as the tests are stable.  Please continue the same plan.  There is no other need for change of treatment or further evaluation based on these results, at this time.  thanks

## 2024-03-10 NOTE — Assessment & Plan Note (Signed)
 Quit x 3 wks!  Pt encouraged to cont to abstain

## 2024-03-10 NOTE — Patient Instructions (Signed)
 Ok to restart the Aspirin  81 mg per day  Please take all new medication as prescribed - the toprol  xl 50 mg per day for blood pressure  Please continue all other medications as before, and refills have been done if requested.  Please have the pharmacy call with any other refills you may need.  Please continue your efforts at being more active, low cholesterol diet, and weight control.  Please keep your appointments with your specialists as you may have planned  Please go to the LAB at the blood drawing area for the tests to be done  You will be contacted by phone if any changes need to be made immediately.  Otherwise, you will receive a letter about your results with an explanation, but please check with MyChart first.  Please make an Appointment to return in 6 months, or sooner if needed

## 2024-03-10 NOTE — Progress Notes (Unsigned)
 Patient ID: Joshua Curtis, male   DOB: September 22, 1950, 73 y.o.   MRN: [id number]        Chief Complaint: follow up HTN, HLD and low vit d, smoker, hyperglycemia       HPI:  Joshua Curtis is a 73 y.o. male here overall doing ok but  Not taking aspirin  since procedure in Mar 2025 and was not told to restart.  Quit smoking x 3 wks, not planning to restart.   Lost 5 lbs with better diet recently.  Plans to f/u with urology Dr Renda by end of year for prostate ca.  Pt denies chest pain, increased sob or doe, wheezing, orthopnea, PND, increased LE swelling, palpitations, dizziness or syncope.   Pt denies polydipsia, polyuria, or new focal neuro s/s.    Pt denies fever, wt loss, night sweats, loss of appetite, or other constitutional symptoms  BP remains elevated despite good med compliance. Wt Readings from Last 3 Encounters:  03/10/24 165 lb 12.8 oz (75.2 kg)  12/29/23 169 lb 1.6 oz (76.7 kg)  11/11/23 170 lb (77.1 kg)   BP Readings from Last 3 Encounters:  03/10/24 (!) 156/78  12/29/23 (!) 149/77  11/11/23 (!) 148/76         Past Medical History:  Diagnosis Date   Anemia, iron  deficiency 04/02/2016   Aortic atherosclerosis (HCC)    Arthritis    rt knee   BPH (benign prostatic hyperplasia) 04/10/2017   Cancer (HCC)    prostate   COLONIC POLYPS, HX OF 11/24/2007   COPD (chronic obstructive pulmonary disease) (HCC)    pt denies this- no respiratory issues of any kind per pt   Coronary artery calcification seen on CAT scan    DIVERTICULOSIS, COLON 11/24/2007   ED (erectile dysfunction)    GERD (gastroesophageal reflux disease)    HLD (hyperlipidemia) 09/06/2020   HYPERTENSION 11/24/2007   Increased prostate specific antigen (PSA) velocity 06/22/2011   PAH (pulmonary artery hypertension) (HCC)    Peripheral vascular disease (HCC)    Past Surgical History:  Procedure Laterality Date   ABDOMINAL AORTOGRAM W/LOWER EXTREMITY N/A 07/04/2020   Procedure: ABDOMINAL AORTOGRAM W/LOWER EXTREMITY;   Surgeon: Serene Gaile ORN, MD;  Location: [facility];  Service: Cardiovascular;  Laterality: N/A;   ABDOMINAL AORTOGRAM W/LOWER EXTREMITY N/A 07/18/2020   Procedure: ABDOMINAL AORTOGRAM W/LOWER EXTREMITY;  Surgeon: Serene Gaile ORN, MD;  Location: [facility];  Service: Cardiovascular;  Laterality: N/A;   ABDOMINAL AORTOGRAM W/LOWER EXTREMITY N/A 04/04/2021   Procedure: ABDOMINAL AORTOGRAM W/LOWER EXTREMITY;  Surgeon: Magda Debby SAILOR, MD;  Location: [facility];  Service: Cardiovascular;  Laterality: N/A;   ABDOMINAL AORTOGRAM W/LOWER EXTREMITY Right 11/11/2023   Procedure: ABDOMINAL AORTOGRAM W/ RIGHT LOWER EXTREMITY RUNOFF AND POSSIBLE INTERVENTION;  Surgeon: Serene Gaile ORN, MD;  [facility]: [facility];  Service: Cardiovascular;  Laterality: Right;   BYPASS GRAFT FEMORAL-PERONEAL Right 04/13/2021   Procedure: RIGHT LEG FEMORAL-PERONEAL BYPASS USING PROPATEN GRAFT;  Surgeon: Serene Gaile ORN, MD;  Location: [facility];  Service: Vascular;  Laterality: Right;   COLONOSCOPY     ESOPHAGOGASTRODUODENOSCOPY (EGD) WITH PROPOFOL  N/A 12/09/2021   Procedure: ESOPHAGOGASTRODUODENOSCOPY (EGD) WITH PROPOFOL ;  Surgeon: Rollin Dover, MD;  Location: [facility];  Service: Gastroenterology;  Laterality: N/A;   HOT HEMOSTASIS N/A 12/09/2021   Procedure: HOT HEMOSTASIS (ARGON PLASMA COAGULATION/BICAP);  Surgeon: Rollin Dover, MD;  Location: [facility];  Service: Gastroenterology;  Laterality: N/A;   LOWER EXTREMITY INTERVENTION Right 11/11/2023  Procedure: LOWER EXTREMITY INTERVENTION;  Surgeon: Serene Gaile ORN, MD;  [facility];  Service: Cardiovascular;  Laterality: Right;   PATCH ANGIOPLASTY Right 04/13/2021   Procedure: JILLENE USING VEIN PATCH ANGIOPLASTY;  Surgeon: Serene Gaile ORN, MD;  [facility];  Service: Vascular;  Laterality: Right;   PERIPHERAL VASCULAR BALLOON ANGIOPLASTY Right 07/18/2020   Procedure: PERIPHERAL VASCULAR BALLOON ANGIOPLASTY;   Surgeon: Serene Gaile ORN, MD;  [facility];  Service: Cardiovascular;  Laterality: Right;  tibioperoneal trunk and posterior tibial   PERIPHERAL VASCULAR INTERVENTION  07/04/2020   Procedure: PERIPHERAL VASCULAR INTERVENTION;  Surgeon: Serene Gaile ORN, MD;  [facility];  Service: Cardiovascular;;  Lt. SFA and Popliteal   PERIPHERAL VASCULAR INTERVENTION Right 07/18/2020   Procedure: PERIPHERAL VASCULAR INTERVENTION;  Surgeon: Serene Gaile ORN, MD;  [facility];  Service: Cardiovascular;  Laterality: Right;  Femoral Popliteal   ROBOT ASSISTED LAPAROSCOPIC RADICAL PROSTATECTOMY N/A 01/08/2021   Procedure: XI ROBOTIC ASSISTED LAPAROSCOPIC RADICAL PROSTATECTOMY LEVEL 3;  Surgeon: Renda Glance, MD;  Location: [facility];  Service: Urology;  Laterality: N/A;   ROBOTIC ASSITED PARTIAL NEPHRECTOMY Left 08/18/2023   Procedure: XI LEFT ROBOTIC ASSISTED LAPAROSCOPIC PARTIAL NEPHRECTOMY;  Surgeon: Renda Glance, MD;  Location: [facility];  Service: Urology;  Laterality: Left;  210 MINUTES NEEDED FOR CASE   SCLEROTHERAPY  12/09/2021   Procedure: SCLEROTHERAPY;  Surgeon: Rollin Dover, MD;  Location: [facility];  Service: Gastroenterology;;    reports that he has been smoking cigarettes. He has never used smokeless tobacco. He reports that he does not currently use alcohol. He reports that he does not use drugs. family history includes Anuerysm in his brother; Diabetes in his father and paternal grandfather; Hypertension in his sister; Kidney failure in his brother; Stroke in his mother. No Known Allergies Current Outpatient Medications on File Prior to Visit  Medication Sig Dispense Refill   ferrous sulfate  325 (65 FE) MG tablet Take 325 mg by mouth daily with breakfast.     rosuvastatin  (CRESTOR ) 40 MG tablet Take 40 mg by mouth every morning.     aspirin  EC 81 MG tablet Take 1 tablet (81 mg total) by mouth daily. Swallow whole. (Patient not taking: Reported  on 03/10/2024) 30 tablet 12   No current facility-administered medications on file prior to visit.        ROS:  All others reviewed and negative.  Objective        PE:  BP (!) 156/78   Pulse 82   Temp 97.8 F (36.6 C)   Ht 5' 10 (1.778 m)   Wt 165 lb 12.8 oz (75.2 kg)   SpO2 99%   BMI 23.79 kg/m                 Constitutional: Pt appears in NAD               HENT: Head: NCAT.                Right Ear: External ear normal.                 Left Ear: External ear normal.                Eyes: . Pupils are equal, round, and reactive to light. Conjunctivae and EOM are normal               Nose: without d/c or deformity  Neck: Neck supple. Gross normal ROM               Cardiovascular: Normal rate and regular rhythm.                 Pulmonary/Chest: Effort normal and breath sounds without rales or wheezing.                Abd:  Soft, NT, ND, + BS, no organomegaly               Neurological: Pt is alert. At baseline orientation, motor grossly intact               Skin: Skin is warm. No rashes, no other new lesions, LE edema - none               Psychiatric: Pt behavior is normal without agitation   Micro: none  Cardiac tracings I have personally interpreted today:  none  Pertinent Radiological findings (summarize): none   Lab Results  Component Value Date   WBC 5.4 03/10/2024   HGB 14.1 03/10/2024   HCT 42.7 03/10/2024   PLT 183.0 03/10/2024   GLUCOSE 96 03/10/2024   CHOL 82 03/10/2024   TRIG 64.0 03/10/2024   HDL 46.20 03/10/2024   LDLDIRECT 74.0 04/21/2019   LDLCALC 23 03/10/2024   ALT 35 03/10/2024   AST 29 03/10/2024   NA 141 03/10/2024   K 3.8 03/10/2024   CL 106 03/10/2024   CREATININE 1.28 03/10/2024   BUN 18 03/10/2024   CO2 26 03/10/2024   TSH 1.05 03/10/2024   PSA 0.00 (L) 09/09/2022   INR 1.3 (H) 12/10/2021   HGBA1C 6.4 03/10/2024   Assessment/Plan:  Joshua Curtis is a 73 y.o. Black or African American [2] male with  has a past medical  history of Anemia, iron  deficiency (04/02/2016), Aortic atherosclerosis (HCC), Arthritis, BPH (benign prostatic hyperplasia) (04/10/2017), Cancer (HCC), COLONIC POLYPS, HX OF (11/24/2007), COPD (chronic obstructive pulmonary disease) (HCC), Coronary artery calcification seen on CAT scan, DIVERTICULOSIS, COLON (11/24/2007), ED (erectile dysfunction), GERD (gastroesophageal reflux disease), HLD (hyperlipidemia) (09/06/2020), HYPERTENSION (11/24/2007), Increased prostate specific antigen (PSA) velocity (06/22/2011), PAH (pulmonary artery hypertension) (HCC), and Peripheral vascular disease (HCC).  Smoker Quit x 3 wks!  Pt encouraged to cont to abstain  Vitamin D  deficiency Last vitamin D  Lab Results  Component Value Date   VD25OH 13.70 (L) 03/10/2024   Low, to start oral replacement   Hypertension BP Readings from Last 3 Encounters:  03/10/24 (!) 156/78  12/29/23 (!) 149/77  11/11/23 (!) 148/76   uncontrolled, pt to continue medical treatment norvasc  10 every day, but add toprol  xl 50 mg every day, f/u bp at home and next visit   Hyperglycemia Lab Results  Component Value Date   HGBA1C 6.4 03/10/2024   Stable, pt to continue current medical treatment  - diet, wt control   HLD (hyperlipidemia) Lab Results  Component Value Date   LDLCALC 23 03/10/2024   Stable, pt to continue current statin crestor  40 mg qd   CKD (chronic kidney disease) stage 3, GFR 30-59 ml/min (HCC) Lab Results  Component Value Date   CREATININE 1.28 03/10/2024   Stable overall, cont to avoid nephrotoxins   PVD (peripheral vascular disease) (HCC) Ok to restart asa 81 mg qd  Followup: Return in about 6 months (around 09/10/2024).  Lynwood Rush, MD 03/11/2024 6:27 PM [facility]

## 2024-03-11 ENCOUNTER — Encounter: Payer: Self-pay | Admitting: Internal Medicine

## 2024-03-11 NOTE — Assessment & Plan Note (Signed)
 Lab Results  Component Value Date   CREATININE 1.28 03/10/2024   Stable overall, cont to avoid nephrotoxins

## 2024-03-11 NOTE — Assessment & Plan Note (Signed)
 Lab Results  Component Value Date   HGBA1C 6.4 03/10/2024   Stable, pt to continue current medical treatment  - diet, wt control

## 2024-03-11 NOTE — Assessment & Plan Note (Signed)
 Ok to restart asa 81 mg qd

## 2024-03-11 NOTE — Assessment & Plan Note (Signed)
 BP Readings from Last 3 Encounters:  03/10/24 (!) 156/78  12/29/23 (!) 149/77  11/11/23 (!) 148/76   uncontrolled, pt to continue medical treatment norvasc  10 every day, but add toprol  xl 50 mg every day, f/u bp at home and next visit

## 2024-03-11 NOTE — Assessment & Plan Note (Signed)
 Last vitamin D  Lab Results  Component Value Date   VD25OH 13.70 (L) 03/10/2024   Low, to start oral replacement

## 2024-03-11 NOTE — Assessment & Plan Note (Signed)
 Lab Results  Component Value Date   LDLCALC 23 03/10/2024   Stable, pt to continue current statin crestor  40 mg qd

## 2024-06-02 ENCOUNTER — Other Ambulatory Visit: Payer: Self-pay | Admitting: Internal Medicine

## 2024-06-02 ENCOUNTER — Other Ambulatory Visit: Payer: Self-pay

## 2024-06-21 ENCOUNTER — Ambulatory Visit (HOSPITAL_COMMUNITY)
Admission: RE | Admit: 2024-06-21 | Discharge: 2024-06-21 | Disposition: A | Discharge status: Home / Self Care | Source: Ambulatory Visit | Attending: Surgery | Admitting: Surgery

## 2024-06-21 ENCOUNTER — Ambulatory Visit

## 2024-06-21 ENCOUNTER — Ambulatory Visit (HOSPITAL_BASED_OUTPATIENT_CLINIC_OR_DEPARTMENT_OTHER)
Admission: RE | Admit: 2024-06-21 | Discharge: 2024-06-21 | Disposition: A | Discharge status: Home / Self Care | Source: Ambulatory Visit | Attending: Surgery | Admitting: Surgery

## 2024-06-21 DIAGNOSIS — I7025 Atherosclerosis of native arteries of other extremities with ulceration: Secondary | ICD-10-CM | POA: Insufficient documentation

## 2024-06-21 DIAGNOSIS — I739 Peripheral vascular disease, unspecified: Secondary | ICD-10-CM | POA: Insufficient documentation

## 2024-06-21 LAB — VAS US ABI WITH/WO TBI
Left ABI: 0.95
Right ABI: 1.22

## 2024-07-05 ENCOUNTER — Ambulatory Visit: Attending: Surgery | Admitting: Surgery

## 2024-07-05 ENCOUNTER — Encounter: Payer: Self-pay | Admitting: Surgery

## 2024-07-05 VITALS — BP 138/69 | HR 74 | Temp 98.1°F | Ht 70.0 in | Wt 161.0 lb

## 2024-07-05 DIAGNOSIS — I7025 Atherosclerosis of native arteries of other extremities with ulceration: Secondary | ICD-10-CM

## 2024-07-05 NOTE — Progress Notes (Signed)
 [facility] [place]  Patient name: Joshua Curtis MRN: [id number] DOB: Sep 24, 1950 Sex: male   REASON FOR VISIT:    Follow-up  HISOTRY OF PRESENT ILLNESS:    JOESEPH Curtis is a 73 y.o. male who has undergone the following procedures:  07/04/2020: Left SFA and popliteal stent (ulcer) 07/18/2020: Stent, right superficial femoral-popliteal artery, angioplasty right tibioperoneal trunk and peroneal artery (ulcer) 04/04/2021:Diagnostic angiogram [facility]) 04/13/2021: Right femoral peroneal bypass graft with composite graft, right iliofemoral endarterectomy with vein patch angioplasty  He has no complaints today.  He does not have any open wounds.  He denies claudication.  He continues to take a statin for hypercholesterolemia and an aspirin .  PAST MEDICAL HISTORY:   Past Medical History:  Diagnosis Date   Anemia, iron  deficiency 04/02/2016   Aortic atherosclerosis    Arthritis    rt knee   BPH (benign prostatic hyperplasia) 04/10/2017   Cancer (HCC)    prostate   COLONIC POLYPS, HX OF 11/24/2007   COPD (chronic obstructive pulmonary disease) (HCC)    pt denies this- no respiratory issues of any kind per pt   Coronary artery calcification seen on CAT scan    DIVERTICULOSIS, COLON 11/24/2007   ED (erectile dysfunction)    GERD (gastroesophageal reflux disease)    HLD (hyperlipidemia) 09/06/2020   HYPERTENSION 11/24/2007   Increased prostate specific antigen (PSA) velocity 06/22/2011   PAH (pulmonary artery hypertension) (HCC)    Peripheral vascular disease      FAMILY HISTORY:   Family History  Problem Relation Age of Onset   Stroke Mother    Diabetes Father    Hypertension Sister    Kidney failure Brother        dialysis   Diabetes Paternal Grandfather    Anuerysm Brother    Colon cancer Neg Hx    Esophageal cancer Neg Hx    Rectal cancer Neg Hx    Stomach cancer Neg Hx     SOCIAL HISTORY:   Social History    Tobacco Use   Smoking status: Some Days    Current packs/day: 0.00    Types: Cigarettes    Last attempt to quit: 04/22/2021    Years since quitting: 3.2   Smokeless tobacco: Never   Tobacco comments:    tryimg to quit  Substance Use Topics   Alcohol use: Not Currently    Comment: beer rare or mix drink daily.     ALLERGIES:   No Known Allergies   CURRENT MEDICATIONS:   Current Outpatient Medications  Medication Sig Dispense Refill   amLODipine  (NORVASC ) 10 MG tablet Take 1 tablet (10 mg total) by mouth daily. 90 tablet 3   aspirin  EC 81 MG tablet Take 1 tablet (81 mg total) by mouth daily. Swallow whole. (Patient not taking: Reported on 03/10/2024) 30 tablet 12   ferrous sulfate  325 (65 FE) MG tablet Take 325 mg by mouth daily with breakfast.     metoprolol  succinate (TOPROL -XL) 50 MG 24 hr tablet Take 1 tablet (50 mg total) by mouth daily. Take with or immediately following a meal. 90 tablet 3   rosuvastatin  (CRESTOR ) 40 MG tablet TAKE 1 TABLET BY MOUTH EVERY DAY IN THE MORNING 90 tablet 3   No current facility-administered medications for this visit.    REVIEW OF SYSTEMS:   [X]  denotes positive finding, [ ]  denotes negative finding Cardiac  Comments:  Chest pain or chest pressure:    Shortness of breath upon  exertion:    Short of breath when lying flat:    Irregular heart rhythm:        Vascular    Pain in calf, thigh, or hip brought on by ambulation:    Pain in feet at night that wakes you up from your sleep:     Blood clot in your veins:    Leg swelling:         Pulmonary    Oxygen at home:    Productive cough:     Wheezing:         Neurologic    Sudden weakness in arms or legs:     Sudden numbness in arms or legs:     Sudden onset of difficulty speaking or slurred speech:    Temporary loss of vision in one eye:     Problems with dizziness:         Gastrointestinal    Blood in stool:     Vomited blood:         Genitourinary    Burning when  urinating:     Blood in urine:        Psychiatric    Major depression:         Hematologic    Bleeding problems:    Problems with blood clotting too easily:        Skin    Rashes or ulcers:        Constitutional    Fever or chills:      PHYSICAL EXAM:   Vitals:   07/05/24 1052  BP: 138/69  Pulse: 74  Temp: 98.1 F (36.7 C)  SpO2: 98%  Weight: 161 lb (73 kg)  Height: 5' 10 (1.778 m)    GENERAL: The patient is a well-nourished male, in no acute distress. The vital signs are documented above. CARDIAC: There is a regular rate and rhythm.  VASCULAR: Nonpalpable pedal pulses PULMONARY: Non-labored respirations MUSCULOSKELETAL: There are no major deformities or cyanosis. NEUROLOGIC: No focal weakness or paresthesias are detected. SKIN: There are no ulcers or rashes noted. PSYCHIATRIC: The patient has a normal affect.  STUDIES:   I have reviewed the following: ABI/TBIToday's ABIToday's TBIPrevious ABIPrevious TBI  +-------+-----------+-----------+------------+------------+  Right 1.22       0.51       0.89        0.47          +-------+-----------+-----------+------------+------------+  Left  0.95       0.43       0.87        0.58          +-------+-----------+-----------+------------+------------+    MEDICAL ISSUES:   PAD: Bypass graft remains widely patent.  Will continue with surveillance in 6 months  Carotid: Will check screening duplex when he returns in 6 months    Malvina Serene CLORE, MD, FACS [facility] [phone] Pager [phone]

## 2024-07-08 ENCOUNTER — Other Ambulatory Visit: Payer: Self-pay | Admitting: *Deleted

## 2024-07-08 DIAGNOSIS — I739 Peripheral vascular disease, unspecified: Secondary | ICD-10-CM

## 2024-07-08 DIAGNOSIS — I7025 Atherosclerosis of native arteries of other extremities with ulceration: Secondary | ICD-10-CM

## 2024-07-08 DIAGNOSIS — R0989 Other specified symptoms and signs involving the circulatory and respiratory systems: Secondary | ICD-10-CM

## 2024-07-28 DIAGNOSIS — Z85528 Personal history of other malignant neoplasm of kidney: Secondary | ICD-10-CM | POA: Diagnosis not present

## 2024-07-28 DIAGNOSIS — C61 Malignant neoplasm of prostate: Secondary | ICD-10-CM | POA: Diagnosis not present

## 2024-08-09 DIAGNOSIS — R918 Other nonspecific abnormal finding of lung field: Secondary | ICD-10-CM | POA: Diagnosis not present

## 2024-08-09 DIAGNOSIS — Z85528 Personal history of other malignant neoplasm of kidney: Secondary | ICD-10-CM | POA: Diagnosis not present

## 2024-08-09 DIAGNOSIS — Z8546 Personal history of malignant neoplasm of prostate: Secondary | ICD-10-CM | POA: Diagnosis not present

## 2024-08-09 DIAGNOSIS — Z905 Acquired absence of kidney: Secondary | ICD-10-CM | POA: Diagnosis not present

## 2024-09-06 ENCOUNTER — Ambulatory Visit (INDEPENDENT_AMBULATORY_CARE_PROVIDER_SITE_OTHER): Payer: PPO

## 2024-09-06 VITALS — BP 130/70 | HR 78 | Ht 70.0 in | Wt 161.6 lb

## 2024-09-06 DIAGNOSIS — Z0000 Encounter for general adult medical examination without abnormal findings: Secondary | ICD-10-CM | POA: Diagnosis not present

## 2024-09-06 DIAGNOSIS — Z23 Encounter for immunization: Secondary | ICD-10-CM | POA: Diagnosis not present

## 2024-09-06 NOTE — Progress Notes (Addendum)
 "  Chief Complaint  Patient presents with   Medicare Wellness     Subjective:   Joshua Curtis is a 74 y.o. male who presents for a Medicare Annual Wellness Visit.  Visit info / Clinical Intake: Medicare Wellness Visit Type:: Subsequent Annual Wellness Visit Persons participating in visit and providing information:: patient Medicare Wellness Visit Mode:: In-person (required for WTM) Interpreter Needed?: No Pre-visit prep was completed: yes AWV questionnaire completed by patient prior to visit?: no Living arrangements:: lives with spouse/significant other Patient's Overall Health Status Rating: very good Typical amount of pain: none Does pain affect daily life?: no Are you currently prescribed opioids?: no  Dietary Habits and Nutritional Risks How many meals a day?: 3 Eats fruit and vegetables daily?: yes Most meals are obtained by: preparing own meals; eating out (per pt-50/50) In the last 2 weeks, have you had any of the following?: none Diabetic:: no  Functional Status Activities of Daily Living (to include ambulation/medication): Independent Ambulation: Independent with device- listed below Home Assistive Devices/Equipment: Eyeglasses Medication Administration: Independent Home Management (perform basic housework or laundry): Independent Manage your own finances?: yes Primary transportation is: driving Concerns about vision?: no *vision screening is required for WTM* Concerns about hearing?: no  Fall Screening Falls in the past year?: 0 Number of falls in past year: 0 Was there an injury with Fall?: 0 Fall Risk Category Calculator: 0 Patient Fall Risk Level: Low Fall Risk  Fall Risk Patient at Risk for Falls Due to: No Fall Risks Fall risk Follow up: Falls evaluation completed; Falls prevention discussed  Home and Transportation Safety: All rugs have non-skid backing?: N/A, no rugs All stairs or steps have railings?: yes (3 steps coming in home) Grab bars in  the bathtub or shower?: yes Have non-skid surface in bathtub or shower?: yes Good home lighting?: yes Regular seat belt use?: yes Hospital stays in the last year:: no  Cognitive Assessment Difficulty concentrating, remembering, or making decisions? : no Will 6CIT or Mini Cog be Completed: no 6CIT or Mini Cog Declined: patient alert, oriented, able to answer questions appropriately and recall recent events  Advance Directives (For Healthcare) Does Patient Have a Medical Advance Directive?: No (per pt-working on living Will) Would patient like information on creating a medical advance directive?: No - Patient declined  Reviewed/Updated  Reviewed/Updated: Reviewed All (Medical, Surgical, Family, Medications, Allergies, Care Teams, Patient Goals)    Allergies (verified) Patient has no allergy information on record.   Current Medications (verified) Outpatient Encounter Medications as of 09/06/2024  Medication Sig   amLODipine  (NORVASC ) 10 MG tablet Take 1 tablet (10 mg total) by mouth daily.   aspirin  EC 81 MG tablet Take 1 tablet (81 mg total) by mouth daily. Swallow whole.   ferrous sulfate  325 (65 FE) MG tablet Take 325 mg by mouth daily with breakfast.   metoprolol  succinate (TOPROL -XL) 50 MG 24 hr tablet Take 1 tablet (50 mg total) by mouth daily. Take with or immediately following a meal.   rosuvastatin  (CRESTOR ) 40 MG tablet TAKE 1 TABLET BY MOUTH EVERY DAY IN THE MORNING   No facility-administered encounter medications on file as of 09/06/2024.    History: Past Medical History:  Diagnosis Date   Anemia, iron  deficiency 04/02/2016   Aortic atherosclerosis    Arthritis    rt knee   BPH (benign prostatic hyperplasia) 04/10/2017   Cancer (HCC)    prostate   COLONIC POLYPS, HX OF 11/24/2007   COPD (chronic obstructive pulmonary disease) (  HCC)    pt denies this- no respiratory issues of any kind per pt   Coronary artery calcification seen on CAT scan    DIVERTICULOSIS, COLON  11/24/2007   ED (erectile dysfunction)    GERD (gastroesophageal reflux disease)    HLD (hyperlipidemia) 09/06/2020   HYPERTENSION 11/24/2007   Increased prostate specific antigen (PSA) velocity 06/22/2011   PAH (pulmonary artery hypertension) (HCC)    Peripheral vascular disease    Past Surgical History:  Procedure Laterality Date   ABDOMINAL AORTOGRAM W/LOWER EXTREMITY N/A 07/04/2020   Procedure: ABDOMINAL AORTOGRAM W/LOWER EXTREMITY;  Surgeon: Serene Gaile ORN, MD;  Location: [facility];  Service: Cardiovascular;  Laterality: N/A;   ABDOMINAL AORTOGRAM W/LOWER EXTREMITY N/A 07/18/2020   Procedure: ABDOMINAL AORTOGRAM W/LOWER EXTREMITY;  Surgeon: Serene Gaile ORN, MD;  Location: [facility];  Service: Cardiovascular;  Laterality: N/A;   ABDOMINAL AORTOGRAM W/LOWER EXTREMITY N/A 04/04/2021   Procedure: ABDOMINAL AORTOGRAM W/LOWER EXTREMITY;  Surgeon: Magda Debby SAILOR, MD;  Location: [facility];  Service: Cardiovascular;  Laterality: N/A;   ABDOMINAL AORTOGRAM W/LOWER EXTREMITY Right 11/11/2023   Procedure: ABDOMINAL AORTOGRAM W/ RIGHT LOWER EXTREMITY RUNOFF AND POSSIBLE INTERVENTION;  Surgeon: Serene Gaile ORN, MD;  Location: [facility];  Service: Cardiovascular;  Laterality: Right;   BYPASS GRAFT FEMORAL-PERONEAL Right 04/13/2021   Procedure: RIGHT LEG FEMORAL-PERONEAL BYPASS USING PROPATEN GRAFT;  Surgeon: Serene Gaile ORN, MD;  Location: [facility];  Service: Vascular;  Laterality: Right;   COLONOSCOPY     ESOPHAGOGASTRODUODENOSCOPY (EGD) WITH PROPOFOL  N/A 12/09/2021   Procedure: ESOPHAGOGASTRODUODENOSCOPY (EGD) WITH PROPOFOL ;  Surgeon: Rollin Dover, MD;  Location: [facility];  Service: Gastroenterology;  Laterality: N/A;   HOT HEMOSTASIS N/A 12/09/2021   Procedure: HOT HEMOSTASIS (ARGON PLASMA COAGULATION/BICAP);  Surgeon: Rollin Dover, MD;  Location: [facility];  Service: Gastroenterology;  Laterality: N/A;   LOWER EXTREMITY INTERVENTION Right 11/11/2023    Procedure: LOWER EXTREMITY INTERVENTION;  Surgeon: Serene Gaile ORN, MD;  [facility];  Service: Cardiovascular;  Laterality: Right;   PATCH ANGIOPLASTY Right 04/13/2021   Procedure: JILLENE USING VEIN PATCH ANGIOPLASTY;  Surgeon: Serene Gaile ORN, MD;  Location: [facility];  Service: Vascular;  Laterality: Right;   PERIPHERAL VASCULAR BALLOON ANGIOPLASTY Right 07/18/2020   Procedure: PERIPHERAL VASCULAR BALLOON ANGIOPLASTY;  Surgeon: Serene Gaile ORN, MD;  [facility];  Service: Cardiovascular;  Laterality: Right;  tibioperoneal trunk and posterior tibial   PERIPHERAL VASCULAR INTERVENTION  07/04/2020   Procedure: PERIPHERAL VASCULAR INTERVENTION;  Surgeon: Serene Gaile ORN, MD;  [facility];  Service: Cardiovascular;;  Lt. SFA and Popliteal   PERIPHERAL VASCULAR INTERVENTION Right 07/18/2020   Procedure: PERIPHERAL VASCULAR INTERVENTION;  Surgeon: Serene Gaile ORN, MD;  [facility]: [facility];  Service: Cardiovascular;  Laterality: Right;  Femoral Popliteal   ROBOT ASSISTED LAPAROSCOPIC RADICAL PROSTATECTOMY N/A 01/08/2021   Procedure: XI ROBOTIC ASSISTED LAPAROSCOPIC RADICAL PROSTATECTOMY LEVEL 3;  Surgeon: Renda Glance, MD;  Location: [facility];  Service: Urology;  Laterality: N/A;   ROBOTIC ASSITED PARTIAL NEPHRECTOMY Left 08/18/2023   Procedure: XI LEFT ROBOTIC ASSISTED LAPAROSCOPIC PARTIAL NEPHRECTOMY;  Surgeon: Renda Glance, MD;  Location: [facility];  Service: Urology;  Laterality: Left;  210 MINUTES NEEDED FOR CASE   SCLEROTHERAPY  12/09/2021   Procedure: SCLEROTHERAPY;  Surgeon: Rollin Dover, MD;  Location: [facility];  Service: Gastroenterology;;   Family History  Problem Relation Age of Onset   Stroke Mother  Diabetes Father    Hypertension Sister    Kidney failure Brother        dialysis   Diabetes Paternal Grandfather    Anuerysm Brother    Colon cancer Neg Hx    Esophageal cancer Neg Hx    Rectal cancer Neg Hx     Stomach cancer Neg Hx    Social History   Occupational History   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   Drug use: No   Sexual activity: Not Currently   Tobacco Counseling Ready to quit: Not Answered Counseling given: Not Answered Tobacco comments: tryimg to quit  SDOH Screenings   Food Insecurity: No Food Insecurity (09/06/2024)  Housing: Unknown (09/06/2024)  Transportation Needs: No Transportation Needs (09/06/2024)  Utilities: Not At Risk (09/06/2024)  Alcohol Screen: Low Risk (09/05/2023)  Depression (PHQ2-9): Low Risk (09/06/2024)  Financial Resource Strain: Low Risk (09/05/2023)  Physical Activity: Sufficiently Active (09/06/2024)  Social Connections: Moderately Isolated (09/06/2024)  Stress: No Stress Concern Present (09/06/2024)  Tobacco Use: High Risk (09/06/2024)  Health Literacy: Adequate Health Literacy (09/06/2024)   See flowsheets for full screening details  Depression Screen PHQ 2 & 9 Depression Scale- Over the past 2 weeks, how often have you been bothered by any of the following problems? Little interest or pleasure in doing things: 0 Feeling down, depressed, or hopeless (PHQ Adolescent also includes...irritable): 0 PHQ-2 Total Score: 0 Trouble falling or staying asleep, or sleeping too much: 0 Feeling tired or having little energy: 0 Poor appetite or overeating (PHQ Adolescent also includes...weight loss): 0 Feeling bad about yourself - or that you are a failure or have let yourself or your family down: 0 Trouble concentrating on things, such as reading the newspaper or  watching television (PHQ Adolescent also includes...like school work): 0 Moving or speaking so slowly that other people could have noticed. Or the opposite - being so fidgety or restless that you have been moving around a lot more than usual: 0 Thoughts that you would be better off dead, or of hurting yourself in some way: 0 PHQ-9 Total Score: 0 If you checked off any problems, how difficult have these problems made it for you to do your work, take care of things at home, or get along with other people?: Not difficult at all  Depression Treatment Depression Interventions/Treatment : EYV7-0 Score <4 Follow-up Not Indicated     Goals Addressed             This Visit's Progress    My goal for 2025 is to maintain my health and stay healthy.   On track            Objective:    Today's Vitals   09/06/24 0814  BP: 130/70  Pulse: 78  SpO2: 98%  Weight: 161 lb 9.6 oz (73.3 kg)  Height: 5' 10 (1.778 m)   Body mass index is 23.19 kg/m.  Hearing/Vision screen Hearing Screening - Comments:: Denies hearing difficulties   Vision Screening -  Comments:: Wears eyeglasses/[employer]/Not UTD Immunizations and Health Maintenance Health Maintenance  Topic Date Due   Zoster Vaccines- Shingrix (1 of 2) Never done   Colonoscopy  01/18/2025   Medicare Annual Wellness (AWV)  09/06/2025   DTaP/Tdap/Td (3 - Td or Tdap) 09/06/2030   Pneumococcal Vaccine: 50+ Years  Completed   Influenza Vaccine  Completed   Hepatitis C Screening  Completed   Meningococcal B Vaccine  Aged Out   COVID-19 Vaccine  Discontinued        Assessment/Plan:  This is a routine wellness examination for [facility].  Patient Care Team: Norleen Lynwood ORN, MD as PCP - General Pietro, Redell RAMAN, MD as PCP - Cardiology (Cardiology) Gershon Donnice SAUNDERS, DPM as Consulting Physician (Podiatry) Renda Glance, MD as Consulting Physician (Urology) Rollin Dover, MD as Consulting Physician (Gastroenterology)  I  have personally reviewed and noted the following in the patients chart:   Medical and social history Use of alcohol, tobacco or illicit drugs  Current medications and supplements including opioid prescriptions. Functional ability and status Nutritional status Physical activity Advanced directives List of other physicians Hospitalizations, surgeries, and ER visits in previous 12 months Vitals Screenings to include cognitive, depression, and falls Referrals and appointments  Orders Placed This Encounter  Procedures   Flu vaccine HIGH DOSE PF(Fluzone Trivalent)   In addition, I have reviewed and discussed with patient certain preventive protocols, quality metrics, and best practice recommendations. A written personalized care plan for preventive services as well as general preventive health recommendations were provided to patient.   Sujey Gundry L Sahalie Beth, CMA   09/06/2024   Return in 1 year (on 09/06/2025).  After Visit Summary: (MyChart) Due to this being a telephonic visit, the after visit summary with patients personalized plan was offered to patient via MyChart   Nurse Notes: Patient is due for a yearly eye exam.  He had no there concerns to address today. "

## 2024-09-06 NOTE — Patient Instructions (Signed)
 Mr. Carreiro,  Thank you for taking the time for your Medicare Wellness Visit. I appreciate your continued commitment to your health goals. Please review the care plan we discussed, and feel free to reach out if I can assist you further.  Please note that Annual Wellness Visits do not include a physical exam. Some assessments may be limited, especially if the visit was conducted virtually. If needed, we may recommend an in-person follow-up with your provider.  Ongoing Care Seeing your primary care provider every 3 to 6 months helps us  monitor your health and provide consistent, personalized care. Next office visit on 09/10/2024.    Referrals If a referral was made during today's visit and you haven't received any updates within two weeks, please contact the referred provider directly to check on the status.  Recommended Screenings:  Health Maintenance  Topic Date Due   Zoster (Shingles) Vaccine (1 of 2) Never done   Flu Shot  04/02/2024   Medicare Annual Wellness Visit  09/04/2024   Colon Cancer Screening  01/18/2025   DTaP/Tdap/Td vaccine (3 - Td or Tdap) 09/06/2030   Pneumococcal Vaccine for age over 19  Completed   Hepatitis C Screening  Completed   Meningitis B Vaccine  Aged Out   COVID-19 Vaccine  Discontinued       11/11/2023    6:07 AM  Advanced Directives  Does Patient Have a Medical Advance Directive? No  Would patient like information on creating a medical advance directive? No - Patient declined    Vision: Annual vision screenings are recommended for early detection of glaucoma, cataracts, and diabetic retinopathy. These exams can also reveal signs of chronic conditions such as diabetes and high blood pressure.  Dental: Annual dental screenings help detect early signs of oral cancer, gum disease, and other conditions linked to overall health, including heart disease and diabetes.  Please see the attached documents for additional preventive care recommendations.

## 2024-09-10 ENCOUNTER — Ambulatory Visit: Admitting: Internal Medicine

## 2024-09-10 ENCOUNTER — Encounter: Payer: Self-pay | Admitting: Internal Medicine

## 2024-09-10 ENCOUNTER — Ambulatory Visit: Payer: Self-pay | Admitting: Internal Medicine

## 2024-09-10 VITALS — BP 122/74 | HR 79 | Temp 98.5°F | Ht 70.0 in | Wt 158.0 lb

## 2024-09-10 DIAGNOSIS — F17200 Nicotine dependence, unspecified, uncomplicated: Secondary | ICD-10-CM | POA: Diagnosis not present

## 2024-09-10 DIAGNOSIS — Z0000 Encounter for general adult medical examination without abnormal findings: Secondary | ICD-10-CM

## 2024-09-10 DIAGNOSIS — E559 Vitamin D deficiency, unspecified: Secondary | ICD-10-CM

## 2024-09-10 DIAGNOSIS — N1831 Chronic kidney disease, stage 3a: Secondary | ICD-10-CM | POA: Diagnosis not present

## 2024-09-10 DIAGNOSIS — I10 Essential (primary) hypertension: Secondary | ICD-10-CM

## 2024-09-10 DIAGNOSIS — R739 Hyperglycemia, unspecified: Secondary | ICD-10-CM

## 2024-09-10 DIAGNOSIS — E7800 Pure hypercholesterolemia, unspecified: Secondary | ICD-10-CM | POA: Diagnosis not present

## 2024-09-10 DIAGNOSIS — E538 Deficiency of other specified B group vitamins: Secondary | ICD-10-CM

## 2024-09-10 DIAGNOSIS — Z0001 Encounter for general adult medical examination with abnormal findings: Secondary | ICD-10-CM

## 2024-09-10 DIAGNOSIS — C61 Malignant neoplasm of prostate: Secondary | ICD-10-CM | POA: Diagnosis not present

## 2024-09-10 DIAGNOSIS — Z8546 Personal history of malignant neoplasm of prostate: Secondary | ICD-10-CM | POA: Diagnosis not present

## 2024-09-10 LAB — HEPATIC FUNCTION PANEL
ALT: 23 U/L (ref 3–53)
AST: 37 U/L (ref 5–37)
Albumin: 4.3 g/dL (ref 3.5–5.2)
Alkaline Phosphatase: 58 U/L (ref 39–117)
Bilirubin, Direct: 0.1 mg/dL (ref 0.1–0.3)
Total Bilirubin: 0.5 mg/dL (ref 0.2–1.2)
Total Protein: 6.9 g/dL (ref 6.0–8.3)

## 2024-09-10 LAB — CBC WITH DIFFERENTIAL/PLATELET
Basophils Absolute: 0.1 K/uL (ref 0.0–0.1)
Basophils Relative: 1.2 % (ref 0.0–3.0)
Eosinophils Absolute: 0.3 K/uL (ref 0.0–0.7)
Eosinophils Relative: 5.6 % — ABNORMAL HIGH (ref 0.0–5.0)
HCT: 40.5 % (ref 39.0–52.0)
Hemoglobin: 13.5 g/dL (ref 13.0–17.0)
Lymphocytes Relative: 19.5 % (ref 12.0–46.0)
Lymphs Abs: 1.1 K/uL (ref 0.7–4.0)
MCHC: 33.4 g/dL (ref 30.0–36.0)
MCV: 94 fl (ref 78.0–100.0)
Monocytes Absolute: 0.6 K/uL (ref 0.1–1.0)
Monocytes Relative: 11.1 % (ref 3.0–12.0)
Neutro Abs: 3.5 K/uL (ref 1.4–7.7)
Neutrophils Relative %: 62.6 % (ref 43.0–77.0)
Platelets: 209 K/uL (ref 150.0–400.0)
RBC: 4.31 Mil/uL (ref 4.22–5.81)
RDW: 15.8 % — ABNORMAL HIGH (ref 11.5–15.5)
WBC: 5.6 K/uL (ref 4.0–10.5)

## 2024-09-10 LAB — BASIC METABOLIC PANEL WITH GFR
BUN: 19 mg/dL (ref 6–23)
CO2: 29 meq/L (ref 19–32)
Calcium: 8.6 mg/dL (ref 8.4–10.5)
Chloride: 104 meq/L (ref 96–112)
Creatinine, Ser: 1.29 mg/dL (ref 0.40–1.50)
GFR: 55.8 mL/min — ABNORMAL LOW
Glucose, Bld: 95 mg/dL (ref 70–99)
Potassium: 4.6 meq/L (ref 3.5–5.1)
Sodium: 140 meq/L (ref 135–145)

## 2024-09-10 LAB — URINALYSIS, ROUTINE W REFLEX MICROSCOPIC
Bilirubin Urine: NEGATIVE
Hgb urine dipstick: NEGATIVE
Ketones, ur: NEGATIVE
Nitrite: NEGATIVE
Specific Gravity, Urine: 1.015 (ref 1.000–1.030)
Urine Glucose: NEGATIVE
Urobilinogen, UA: 0.2 (ref 0.0–1.0)
pH: 6 (ref 5.0–8.0)

## 2024-09-10 LAB — PSA: PSA: 0 ng/mL — ABNORMAL LOW (ref 0.10–4.00)

## 2024-09-10 LAB — LIPID PANEL
Cholesterol: 86 mg/dL (ref 28–200)
HDL: 57.4 mg/dL
LDL Cholesterol: 16 mg/dL (ref 10–99)
NonHDL: 28.82
Total CHOL/HDL Ratio: 2
Triglycerides: 64 mg/dL (ref 10.0–149.0)
VLDL: 12.8 mg/dL (ref 0.0–40.0)

## 2024-09-10 LAB — HEMOGLOBIN A1C: Hgb A1c MFr Bld: 5.9 % (ref 4.6–6.5)

## 2024-09-10 LAB — VITAMIN D 25 HYDROXY (VIT D DEFICIENCY, FRACTURES): VITD: 8.69 ng/mL — ABNORMAL LOW (ref 30.00–100.00)

## 2024-09-10 LAB — VITAMIN B12: Vitamin B-12: 291 pg/mL (ref 211–911)

## 2024-09-10 LAB — TSH: TSH: 1.17 u[IU]/mL (ref 0.35–5.50)

## 2024-09-10 NOTE — Assessment & Plan Note (Signed)
pt counsled to quit, pt not ready

## 2024-09-10 NOTE — Assessment & Plan Note (Signed)
 Lab Results  Component Value Date   LDLCALC 23 03/10/2024   Stable, pt to continue current statin crestor  40 qd

## 2024-09-10 NOTE — Assessment & Plan Note (Signed)
 BP Readings from Last 3 Encounters:  09/10/24 122/74  09/06/24 130/70  07/05/24 138/69   Stable, pt to continue medical treatment norvasc  10 every day, toprol  xl 50 qd

## 2024-09-10 NOTE — Assessment & Plan Note (Signed)
 Lab Results  Component Value Date   HGBA1C 6.4 03/10/2024   Stable, pt to continue current medical treatment  - diet, wt control

## 2024-09-10 NOTE — Patient Instructions (Signed)
 Please have your Shingrix (shingles) shots done at your local pharmacy.  Please stop smoking  Please continue all other medications as before, and refills have been done if requested.  Please have the pharmacy call with any other refills you may need.  Please continue your efforts at being more active, low cholesterol diet, and weight control.  You are otherwise up to date with prevention measures today.  Please keep your appointments with your specialists as you may have planned  Please go to the LAB at the blood drawing area for the tests to be done  You will be contacted by phone if any changes need to be made immediately.  Otherwise, you will receive a letter about your results with an explanation, but please check with MyChart first.  Please make an Appointment to return in 6 months, or sooner if needed

## 2024-09-10 NOTE — Assessment & Plan Note (Signed)
 Age and sex appropriate education and counseling updated with regular exercise and diet Referrals for preventative services - none needed Immunizations addressed - for shingrix at pharmacy Smoking counseling  - pt counsled to quit, pt not ready Evidence for depression or other mood disorder - none significant Most recent labs reviewed. I have personally reviewed and have noted: 1) the patient's medical and social history 2) The patient's current medications and supplements 3) The patient's height, weight, and BMI have been recorded in the chart

## 2024-09-10 NOTE — Assessment & Plan Note (Addendum)
 Ckd3a  Lab Results  Component Value Date   CREATININE 1.28 03/10/2024   Stable overall, cont to avoid nephrotoxins

## 2024-09-10 NOTE — Progress Notes (Signed)
 Patient ID: Joshua Curtis, male   DOB: [birth year], 74 y.o.   MRN: [id number]         Chief Complaint:: wellness exam and history of prostate cancer,  low vit d, smoker, htn , hld, hyperglycemia, ckd3a       HPI:  Joshua Curtis is a 74 y.o. male here for wellness exam; for shingrix at pharmacy, pt still smoking occasionally not ready to quit, o/w up to date                        Also Pt denies chest pain, increased sob or doe, wheezing, orthopnea, PND, increased LE swelling, palpitations, dizziness or syncope.   Pt denies polydipsia, polyuria, or new focal neuro s/s.   Pt denies fever, wt loss, night sweats, loss of appetite, or other constitutional symptoms   Wt Readings from Last 3 Encounters:  09/10/24 158 lb (71.7 kg)  09/06/24 161 lb 9.6 oz (73.3 kg)  07/05/24 161 lb (73 kg)   BP Readings from Last 3 Encounters:  09/10/24 122/74  09/06/24 130/70  07/05/24 138/69   Immunization History  Administered Date(s) Administered   Fluad Quad(high Dose 65+) 04/21/2019, 09/06/2020, 09/09/2022   Fluad Trivalent(High Dose 65+) 09/11/2023   INFLUENZA, HIGH DOSE SEASONAL PF 10/03/2015, 10/16/2018, 06/22/2021, 09/06/2024   Influenza Split 06/21/2011   Influenza, Seasonal, Injecte, Preservative Fre 09/25/2012   Influenza,inj,Quad PF,6+ Mos 09/28/2013, 09/29/2014   PFIZER(Purple Top)SARS-COV-2 Vaccination 11/13/2019, 12/08/2019, 07/28/2020   Pfizer Covid-19 Vaccine Bivalent Booster 56yrs & up 06/22/2021   Pfizer(Comirnaty)Fall Seasonal Vaccine 12 years and older 07/11/2022   Pneumococcal Conjugate-13 10/16/2018   Pneumococcal Polysaccharide-23 03/07/2021   Td 03/14/2010   Tdap 09/06/2020   Health Maintenance Due  Topic Date Due   Zoster Vaccines- Shingrix (1 of 2) Never done      Past Medical History:  Diagnosis Date   Anemia, iron  deficiency 04/02/2016   Aortic atherosclerosis    Arthritis    rt knee   BPH (benign prostatic hyperplasia) 04/10/2017   Cancer (HCC)    prostate    COLONIC POLYPS, HX OF 11/24/2007   COPD (chronic obstructive pulmonary disease) (HCC)    pt denies this- no respiratory issues of any kind per pt   Coronary artery calcification seen on CAT scan    DIVERTICULOSIS, COLON 11/24/2007   ED (erectile dysfunction)    GERD (gastroesophageal reflux disease)    HLD (hyperlipidemia) 09/06/2020   HYPERTENSION 11/24/2007   Increased prostate specific antigen (PSA) velocity 06/22/2011   PAH (pulmonary artery hypertension) (HCC)    Peripheral vascular disease    Past Surgical History:  Procedure Laterality Date   ABDOMINAL AORTOGRAM W/LOWER EXTREMITY N/A 07/04/2020   Procedure: ABDOMINAL AORTOGRAM W/LOWER EXTREMITY;  Surgeon: Serene Gaile ORN, MD;  Location: [facility];  Service: Cardiovascular;  Laterality: N/A;   ABDOMINAL AORTOGRAM W/LOWER EXTREMITY N/A 07/18/2020   Procedure: ABDOMINAL AORTOGRAM W/LOWER EXTREMITY;  Surgeon: Serene Gaile ORN, MD;  Location: [facility];  Service: Cardiovascular;  Laterality: N/A;   ABDOMINAL AORTOGRAM W/LOWER EXTREMITY N/A 04/04/2021   Procedure: ABDOMINAL AORTOGRAM W/LOWER EXTREMITY;  Surgeon: Magda Debby SAILOR, MD;  Location: [facility];  Service: Cardiovascular;  Laterality: N/A;   ABDOMINAL AORTOGRAM W/LOWER EXTREMITY Right 11/11/2023   Procedure: ABDOMINAL AORTOGRAM W/ RIGHT LOWER EXTREMITY RUNOFF AND POSSIBLE INTERVENTION;  Surgeon: Serene Gaile ORN, MD;  Location: [facility];  Service: Cardiovascular;  Laterality: Right;   BYPASS GRAFT  FEMORAL-PERONEAL Right 04/13/2021   Procedure: RIGHT LEG FEMORAL-PERONEAL BYPASS USING PROPATEN GRAFT;  Surgeon: Serene Gaile ORN, MD;  Location: [facility];  Service: Vascular;  Laterality: Right;   COLONOSCOPY     ESOPHAGOGASTRODUODENOSCOPY (EGD) WITH PROPOFOL  N/A 12/09/2021   Procedure: ESOPHAGOGASTRODUODENOSCOPY (EGD) WITH PROPOFOL ;  Surgeon: Rollin Dover, MD;  Location: [facility];  Service: Gastroenterology;  Laterality: N/A;   HOT HEMOSTASIS N/A  12/09/2021   Procedure: HOT HEMOSTASIS (ARGON PLASMA COAGULATION/BICAP);  Surgeon: Rollin Dover, MD;  Location: [facility];  Service: Gastroenterology;  Laterality: N/A;   LOWER EXTREMITY INTERVENTION Right 11/11/2023   Procedure: LOWER EXTREMITY INTERVENTION;  Surgeon: Serene Gaile ORN, MD;  Location: [facility];  Service: Cardiovascular;  Laterality: Right;   PATCH ANGIOPLASTY Right 04/13/2021   Procedure: JILLENE USING VEIN PATCH ANGIOPLASTY;  Surgeon: Serene Gaile ORN, MD;  Location: [facility];  Service: Vascular;  Laterality: Right;   PERIPHERAL VASCULAR BALLOON ANGIOPLASTY Right 07/18/2020   Procedure: PERIPHERAL VASCULAR BALLOON ANGIOPLASTY;  Surgeon: Serene Gaile ORN, MD;  Location: [facility];  Service: Cardiovascular;  Laterality: Right;  tibioperoneal trunk and posterior tibial   PERIPHERAL VASCULAR INTERVENTION  07/04/2020   Procedure: PERIPHERAL VASCULAR INTERVENTION;  Surgeon: Serene Gaile ORN, MD;  [facility]: [facility];  Service: Cardiovascular;;  Lt. SFA and Popliteal   PERIPHERAL VASCULAR INTERVENTION Right 07/18/2020   Procedure: PERIPHERAL VASCULAR INTERVENTION;  Surgeon: Serene Gaile ORN, MD;  [facility]: [facility];  Service: Cardiovascular;  Laterality: Right;  Femoral Popliteal   ROBOT ASSISTED LAPAROSCOPIC RADICAL PROSTATECTOMY N/A 01/08/2021   Procedure: XI ROBOTIC ASSISTED LAPAROSCOPIC RADICAL PROSTATECTOMY LEVEL 3;  Surgeon: Renda Glance, MD;  Location: [facility];  Service: Urology;  Laterality: N/A;   ROBOTIC ASSITED PARTIAL NEPHRECTOMY Left 08/18/2023   Procedure: XI LEFT ROBOTIC ASSISTED LAPAROSCOPIC PARTIAL NEPHRECTOMY;  Surgeon: Renda Glance, MD;  Location: [facility];  Service: Urology;  Laterality: Left;  210 MINUTES NEEDED FOR CASE   SCLEROTHERAPY  12/09/2021   Procedure: SCLEROTHERAPY;  Surgeon: Rollin Dover, MD;  Location: [facility];  Service: Gastroenterology;;    reports that he has been smoking cigarettes. He has been  smoking an average of 0.3 packs per day. He has never used smokeless tobacco. He reports that he does not currently use alcohol. He reports that he does not use drugs. family history includes Anuerysm in his brother; Diabetes in his father and paternal grandfather; Hypertension in his sister; Kidney failure in his brother; Stroke in his mother. Allergies[1] Medications Ordered Prior to Encounter[2]      ROS:  All others reviewed and negative.  Objective        PE:  BP 122/74 (BP Location: Right Arm, Patient Position: Sitting, Cuff Size: Normal)   Pulse 79   Temp 98.5 F (36.9 C) (Oral)   Ht 5' 10 (1.778 m)   Wt 158 lb (71.7 kg)   SpO2 99%   BMI 22.67 kg/m                 Constitutional: Pt appears in NAD               HENT: Head: NCAT.                Right Ear: External ear normal.                 Left Ear: External ear normal.                Eyes: .  Pupils are equal, round, and reactive to light. Conjunctivae and EOM are normal               Nose: without d/c or deformity               Neck: Neck supple. Gross normal ROM               Cardiovascular: Normal rate and regular rhythm.                 Pulmonary/Chest: Effort normal and breath sounds without rales or wheezing.                Abd:  Soft, NT, ND, + BS, no organomegaly               Neurological: Pt is alert. At baseline orientation, motor grossly intact               Skin: Skin is warm. No rashes, no other new lesions, LE edema - none               Psychiatric: Pt behavior is normal without agitation   Micro: none  Cardiac tracings I have personally interpreted today:  none  Pertinent Radiological findings (summarize): none   Lab Results  Component Value Date   WBC 5.4 03/10/2024   HGB 14.1 03/10/2024   HCT 42.7 03/10/2024   PLT 183.0 03/10/2024   GLUCOSE 96 03/10/2024   CHOL 82 03/10/2024   TRIG 64.0 03/10/2024   HDL 46.20 03/10/2024   LDLDIRECT 74.0 04/21/2019   LDLCALC 23 03/10/2024   ALT 35 03/10/2024    AST 29 03/10/2024   NA 141 03/10/2024   K 3.8 03/10/2024   CL 106 03/10/2024   CREATININE 1.28 03/10/2024   BUN 18 03/10/2024   CO2 26 03/10/2024   TSH 1.05 03/10/2024   PSA 0.00 (L) 09/09/2022   INR 1.3 (H) 12/10/2021   HGBA1C 6.4 03/10/2024   Assessment/Plan:  Joshua Curtis is a 74 y.o. Black or African American [2] male with  has a past medical history of Anemia, iron  deficiency (04/02/2016), Aortic atherosclerosis, Arthritis, BPH (benign prostatic hyperplasia) (04/10/2017), Cancer (HCC), COLONIC POLYPS, HX OF (11/24/2007), COPD (chronic obstructive pulmonary disease) (HCC), Coronary artery calcification seen on CAT scan, DIVERTICULOSIS, COLON (11/24/2007), ED (erectile dysfunction), GERD (gastroesophageal reflux disease), HLD (hyperlipidemia) (09/06/2020), HYPERTENSION (11/24/2007), Increased prostate specific antigen (PSA) velocity (06/22/2011), PAH (pulmonary artery hypertension) (HCC), and Peripheral vascular disease.  CKD (chronic kidney disease) stage 3, GFR 30-59 ml/min (HCC) Ckd3a  Lab Results  Component Value Date   CREATININE 1.28 03/10/2024   Stable overall, cont to avoid nephrotoxins    History of prostate cancer Lab Results  Component Value Date   PSA 0.00 (L) 09/09/2022   PSA 0.00 (L) 01/24/2022   PSA 0.00 (L) 09/07/2021   Stable overall, for f/u lab today  Encounter for well adult exam with abnormal findings Age and sex appropriate education and counseling updated with regular exercise and diet Referrals for preventative services - none needed Immunizations addressed - for shingrix at pharmacy Smoking counseling  - pt counsled to quit, pt not ready Evidence for depression or other mood disorder - none significant Most recent labs reviewed. I have personally reviewed and have noted: 1) the patient's medical and social history 2) The patient's current medications and supplements 3) The patient's height, weight, and BMI have been recorded in the  chart   Smoker pt counsled to quit, pt not  ready  Vitamin D  deficiency Last vitamin D  Lab Results  Component Value Date   VD25OH 13.70 (L) 03/10/2024   Low, to start oral replacement   Hypertension BP Readings from Last 3 Encounters:  09/10/24 122/74  09/06/24 130/70  07/05/24 138/69   Stable, pt to continue medical treatment norvasc  10 every day, toprol  xl 50 qd   Hyperglycemia Lab Results  Component Value Date   HGBA1C 6.4 03/10/2024   Stable, pt to continue current medical treatment  - diet, wt control   HLD (hyperlipidemia) Lab Results  Component Value Date   LDLCALC 23 03/10/2024   Stable, pt to continue current statin crestor  40 qd  Followup: Return in about 6 months (around 03/10/2025).  Lynwood Rush, MD 09/10/2024 12:54 PM [facility]     [1] Not on File [2]  Current Outpatient Medications on File Prior to Visit  Medication Sig Dispense Refill   amLODipine  (NORVASC ) 10 MG tablet Take 1 tablet (10 mg total) by mouth daily. 90 tablet 3   aspirin  EC 81 MG tablet Take 1 tablet (81 mg total) by mouth daily. Swallow whole. 30 tablet 12   metoprolol  succinate (TOPROL -XL) 50 MG 24 hr tablet Take 1 tablet (50 mg total) by mouth daily. Take with or immediately following a meal. 90 tablet 3   rosuvastatin  (CRESTOR ) 40 MG tablet TAKE 1 TABLET BY MOUTH EVERY DAY IN THE MORNING 90 tablet 3   ferrous sulfate  325 (65 FE) MG tablet Take 325 mg by mouth daily with breakfast.     No current facility-administered medications on file prior to visit.

## 2024-09-10 NOTE — Assessment & Plan Note (Signed)
 Last vitamin D  Lab Results  Component Value Date   VD25OH 13.70 (L) 03/10/2024   Low, to start oral replacement

## 2024-09-10 NOTE — Assessment & Plan Note (Signed)
 Lab Results  Component Value Date   PSA 0.00 (L) 09/09/2022   PSA 0.00 (L) 01/24/2022   PSA 0.00 (L) 09/07/2021   Stable overall, for f/u lab today

## 2025-01-03 ENCOUNTER — Ambulatory Visit (HOSPITAL_COMMUNITY)

## 2025-01-03 ENCOUNTER — Ambulatory Visit

## 2025-03-10 ENCOUNTER — Ambulatory Visit: Admitting: Family Medicine
